# Patient Record
Sex: Male | Born: 1945 | Race: Black or African American | Hispanic: No | Marital: Single | Smoking: Former smoker
Health system: Southern US, Community
[De-identification: ages and names within clinical notes are randomized; demographics above are authoritative.]

## PROBLEM LIST (undated history)

## (undated) DIAGNOSIS — I340 Nonrheumatic mitral (valve) insufficiency: Secondary | ICD-10-CM

## (undated) DIAGNOSIS — I119 Hypertensive heart disease without heart failure: Secondary | ICD-10-CM

## (undated) DIAGNOSIS — I2089 Other forms of angina pectoris: Secondary | ICD-10-CM

## (undated) DIAGNOSIS — G629 Polyneuropathy, unspecified: Secondary | ICD-10-CM

## (undated) DIAGNOSIS — I208 Other forms of angina pectoris: Secondary | ICD-10-CM

## (undated) DIAGNOSIS — G4730 Sleep apnea, unspecified: Secondary | ICD-10-CM

## (undated) DIAGNOSIS — E119 Type 2 diabetes mellitus without complications: Secondary | ICD-10-CM

## (undated) DIAGNOSIS — I5032 Chronic diastolic (congestive) heart failure: Secondary | ICD-10-CM

## (undated) DIAGNOSIS — I420 Dilated cardiomyopathy: Secondary | ICD-10-CM

## (undated) DIAGNOSIS — I10 Essential (primary) hypertension: Secondary | ICD-10-CM

## (undated) DIAGNOSIS — I519 Heart disease, unspecified: Secondary | ICD-10-CM

## (undated) DIAGNOSIS — S069X9A Unspecified intracranial injury with loss of consciousness of unspecified duration, initial encounter: Secondary | ICD-10-CM

## (undated) DIAGNOSIS — S069XAA Unspecified intracranial injury with loss of consciousness status unknown, initial encounter: Secondary | ICD-10-CM

## (undated) HISTORY — PX: BACK SURGERY: SHX140

---

## 2005-11-14 ENCOUNTER — Emergency Department: Payer: Self-pay | Admitting: Emergency Medicine

## 2008-09-05 ENCOUNTER — Inpatient Hospital Stay (HOSPITAL_COMMUNITY): Admission: AC | Admit: 2008-09-05 | Discharge: 2008-09-07 | Payer: Self-pay

## 2010-06-18 LAB — CBC
HCT: 38.8 % — ABNORMAL LOW (ref 39.0–52.0)
Hemoglobin: 12.8 g/dL — ABNORMAL LOW (ref 13.0–17.0)
MCHC: 33.1 g/dL (ref 30.0–36.0)
MCHC: 33.3 g/dL (ref 30.0–36.0)
MCHC: 33.5 g/dL (ref 30.0–36.0)
MCV: 85.2 fL (ref 78.0–100.0)
MCV: 85.4 fL (ref 78.0–100.0)
MCV: 85.5 fL (ref 78.0–100.0)
Platelets: 189 10*3/uL (ref 150–400)
Platelets: 216 10*3/uL (ref 150–400)
RBC: 4.51 MIL/uL (ref 4.22–5.81)
RDW: 16.3 % — ABNORMAL HIGH (ref 11.5–15.5)
RDW: 16.5 % — ABNORMAL HIGH (ref 11.5–15.5)

## 2010-06-18 LAB — POCT I-STAT, CHEM 8
BUN: 10 mg/dL (ref 6–23)
Calcium, Ion: 1.02 mmol/L — ABNORMAL LOW (ref 1.12–1.32)
Creatinine, Ser: 1.5 mg/dL (ref 0.4–1.5)
TCO2: 22 mmol/L (ref 0–100)

## 2010-06-18 LAB — HEPATIC FUNCTION PANEL
AST: 77 U/L — ABNORMAL HIGH (ref 0–37)
Bilirubin, Direct: 0.2 mg/dL (ref 0.0–0.3)
Indirect Bilirubin: 1.4 mg/dL — ABNORMAL HIGH (ref 0.3–0.9)

## 2010-06-18 LAB — TYPE AND SCREEN
ABO/RH(D): O POS
Antibody Screen: NEGATIVE

## 2010-06-18 LAB — BASIC METABOLIC PANEL
CO2: 26 mEq/L (ref 19–32)
Calcium: 8.8 mg/dL (ref 8.4–10.5)
Chloride: 111 mEq/L (ref 96–112)
GFR calc Af Amer: >60 mL/min (ref >60)
Sodium: 143 mEq/L (ref 135–145)

## 2010-06-18 LAB — AMYLASE: Amylase: 102 U/L (ref 27–131)

## 2010-06-18 LAB — GLUCOSE, CAPILLARY
Glucose-Capillary: 118 mg/dL — ABNORMAL HIGH (ref 70–99)
Glucose-Capillary: 157 mg/dL — ABNORMAL HIGH (ref 70–99)

## 2010-06-18 LAB — LACTIC ACID, PLASMA: Lactic Acid, Venous: 2.6 mmol/L — ABNORMAL HIGH (ref 0.5–2.2)

## 2010-06-18 LAB — LIPASE, BLOOD: Lipase: 21 U/L (ref 11–59)

## 2010-06-18 LAB — ABO/RH: ABO/RH(D): O POS

## 2010-07-24 NOTE — H&P (Signed)
[name], Malik [name]           ACCOUNT NO.:  [id number]   MEDICAL RECORD NO.:  [id number]          PATIENT TYPE:  EMS   LOCATION:  MAJO                         FACILITY:  [facility]   PHYSICIAN:  Juanetta Gosling, MDDATE OF BIRTH:  1945-08-21   DATE OF ADMISSION:  09/05/2008  DATE OF DISCHARGE:                              HISTORY & PHYSICAL   CHIEF COMPLAINT:  Status post crush injury.   HISTORY OF PRESENT ILLNESS:  This is a 65 year old male who was driving  his Bobcat at home today, got out and then was stuck between the  shoveled portion of the [place] and another object.  He was extracted  with some help from his neighbors.  He was awake for the entire event  and remembers the entire incident.  He arrives with some right-sided  chest and flank pain.  He was deemed a gold trauma due to the possible  crush injury, but remained stable upon transfer from [place].  He had  no other complaints.   PAST MEDICAL HISTORY:  1. Diabetes.  2. Hypertension.  He has been on medication for both of those in the      past, which he stopped on his own 1 year ago and has not seen the      physician since then.   PAST SURGICAL HISTORY:  Lumbar surgery.   SOCIAL HISTORY:  Denies drug, tobacco.  He does drink occasional beer.  He lives by himself in [place], he is retired.   ALLERGIES:  He has no known drug allergies.   MEDICATIONS:  None currently.  Primary physician is in [place].  His  tetanus was given today.   REVIEW OF SYSTEMS:  Review of systems is otherwise negative.  He has no  history of MI or any cardiac disease.   PHYSICAL EXAMINATION:  VITAL SIGNS:  Afebrile.  Pulse of 96,  respirations 20, blood pressure is 200/120.  His O2 sats are 95 on room  air.  GENERAL:  He is a well-appearing elderly male, in no distress.  HEENT:  Sclerae, anicteric.  His pupils are equal, round and reactive to  light.  His extraocular movements are intact.  His TMs are clear.  They  are  proximally obscured with cerumen.  His face has no lesions or  ecchymosis and he has no obvious malocclusion.  NECK:  Nontender.  His range of motion is grossly intact without pain.  LUNGS:  Clear bilaterally.  He does have some tenderness at the lower  portion of his right ribcage and around to his flank upon palpation.  HEART:  Regular rate and rhythm.  ABDOMEN:  Soft.  He is very mildly tender in his right upper quadrant,  which is mostly secondary to his rib pain, but certainly has no  peritoneal sign.  PELVIS:  No lesion.  He has no meatal blood present and is nontender.  MUSCULOSKELETAL:  He moves all extremities and is nontender throughout  in all extremities with palpable pulses.  BACK:  Low well-healed lumbar scar.  No lesions, or step-offs, or  tenderness are nontender.  NEUROLOGIC:  He has GCS  of 15, oriented x3, had no focal deficits noted  on his examination.   LABORATORY EVALUATION:  Hematocrit of 44.  Sodium 142, potassium 4.1,  chloride 102, CO2 of 22, BUN 10, creatinine 1.5, glucose 193.  EKG; he  has normal sinus rhythm.  His chest x-ray shows right seventh and eight  rib fracture with no pneumothorax and a possible right rib contusion.  Left iliac crest is partially excluded, but otherwise this is without  any evidence of fracture.  CT of his chest shows nondisplaced posterior  right seventh and eighth rib fractures with no evidence of any fusion or  any pneumothorax on CT scan.  He has some atelectasis present.  He does  have also heterogeneous enlargement of his right thyroid with possible  focal lesions, thus ultrasound followup is recommended.  CT of his  abdomen and pelvis shows a small amount of fluid possibly blood along  the liver.  It is noted, there is no real discrete hepatic injury that  is noted due to some artifact.  There is also some high density along  the inferior aspect of the gallbladder, which is difficult to determine  the source of this.  His  pelvis shows no evidence of any free fluid.   ASSESSMENT:  1. Status post crush injury with right rib fractures and have      questionable liver laceration.  2. Untreated hypertension and diabetes.   PLAN:  We will admit and observe him overnight.  Plan on pain control  and pulmonary toilet for his rib fractures.  We will recheck his  hematocrit in the morning, but again, I do not think that he has any  real liver laceration noted on the CT scan, this may just be from the  initial crush, but he will remain n.p.o. overnight and we will continue  to examine him.  He will also need some long-term followup for his  hypertension, diabetes, indeed I will discuss that tonight.      Juanetta Gosling, MD  Electronically Signed     MCW/MEDQ  D:  09/05/2008  T:  09/06/2008  Job:  [id number]

## 2010-07-24 NOTE — Discharge Summary (Signed)
NAMEDAIMIEN, PATMON           ACCOUNT NO.:  [id number]   MEDICAL RECORD NO.:  [id number]          PATIENT TYPE:  INP   LOCATION:  5123                         FACILITY:  [facility]   PHYSICIAN:  Wilmon Arms. Corliss Skains, M.D. DATE OF BIRTH:  February 20, 1946   DATE OF ADMISSION:  09/05/2008  DATE OF DISCHARGE:  09/07/2008                               DISCHARGE SUMMARY   ADMITTING TRAUMA SURGEON:  Troy Sine. Dwain Sarna, MD   CONSULTANTS:  None.   DISCHARGE DIAGNOSES:  1. Status post blunt thoracic and abdominal trauma when the patient      was pinned between a Bobcat and a wall.  2. Right rib fractures x2.  3. Probable small liver laceration with hemoperitoneum.  4. Hypertension, untreated.  5. Diabetes mellitus, untreated.   HISTORY ON ADMISSION:  This is an otherwise relatively healthy African  American male who was apparently doing something with his Bobcat, it got  stuck between the [facility] and some type of a wall or side of a building.  He got his chest and abdominal region pinned in.  He was able to get  extricated with help and has full memory of the incident but is  reporting right-sided chest and flank pain.  He presents with no other  complaints.  He does have a past medical history significant for  hypertension and diabetes but has not been on his medication likely for  greater than a year.  His presenting blood pressure was 200/120.  Plain  chest x-ray showed right-sided rib fractures 7 through 8, no  pneumothorax, a small apical pulmonary contusion.  Plain pelvis film was  negative for fractures.   The patient underwent CT scan of the chest again which showed his right  rib fractures x2.  There was some free fluid around the liver but no  clear liver laceration, although it is felt that he likely did have  small liver laceration.   He was admitted for observation and pain control and immobilization.  His initial hemoglobin was 12.8, hematocrit 38.5, white blood cell count  is  10,300, and platelets of 193,000.  He was starting to mobilize some  but was having some increased discomfort and then began to run a low-  grade fever on September 07, 2008.  He was mobilized again, however, and the  fever resolved.  Followup labs showed a stable hemoglobin at 12.8,  hematocrit of 38.8, white blood cell count of 11,100, and platelets of  189,000.  He did have some minimally elevated liver function studies and  normal amylase and lipase.  Albumin was 2.8.  His bilirubin was  minimally elevated at 1.6, but this may likely be secondary to his liver  injury.  The patient reports feeling dramatically better and is ready to  go home.   At this time, he is felt to be medically stable.   MEDICATIONS AT TIME OF DISCHARGE:  Lotensin 5 mg once daily,  hydrochlorothiazide 25 mg one-half tablet once daily.  These blood  pressure medicines were started during this hospitalization, and the  patient will need to follow up with his regular physician, I believe  that is Dr. Lorin Picket may be in [facility] for further followup of blood  pressure, medications, blood sugars, and possible medication for blood  sugar management.   A prescription was also written for Percocet 5/325 mg 1-2 p.o. q.4 hours  p.r.n. pain, #60, no refill.  He will follow up with [facility]uma Service on  September 15, 2008, at 2:00 p.m. or sooner should he have any difficulties in  the interim.  Diet is low sodium, heart healthy, no sugar, no sweets,  low carbohydrate.      Shawn Rayburn, P.A.      Wilmon Arms. Tsuei, M.D.  Electronically Signed    SR/MEDQ  D:  09/07/2008  T:  09/08/2008  Job:  [id number]   cc:   [facility].  [employer] in [place].

## 2010-10-12 ENCOUNTER — Emergency Department: Payer: Self-pay | Admitting: Unknown Physician Specialty

## 2011-06-11 LAB — URINALYSIS, COMPLETE
Bacteria: NONE SEEN
Bilirubin,UR: NEGATIVE
Hyaline Cast: 1
Ketone: NEGATIVE
Ph: 5 (ref 4.5–8.0)
Specific Gravity: 1.025 (ref 1.003–1.030)
WBC UR: <1 /HPF (ref 0–5)

## 2011-06-11 LAB — CBC WITH DIFFERENTIAL/PLATELET
Basophil %: 0.4 %
HGB: 13.2 g/dL (ref 13.0–18.0)
Lymphocyte #: 1.6 10*3/uL (ref 1.0–3.6)
Lymphocyte %: 15.8 %
MCHC: 33.6 g/dL (ref 32.0–36.0)
MCV: 89 fL (ref 80–100)
Monocyte #: 1 10*3/uL — ABNORMAL HIGH (ref 0.0–0.7)
Platelet: 193 10*3/uL (ref 150–440)
WBC: 10.4 10*3/uL (ref 3.8–10.6)

## 2011-06-11 LAB — BASIC METABOLIC PANEL
Anion Gap: 10 (ref 7–16)
BUN: 16 mg/dL (ref 7–18)
Chloride: 108 mmol/L — ABNORMAL HIGH (ref 98–107)
Co2: 24 mmol/L (ref 21–32)
Creatinine: 1.3 mg/dL (ref 0.60–1.30)
EGFR (Non-African Amer.): 59 — ABNORMAL LOW

## 2011-06-12 ENCOUNTER — Observation Stay: Payer: Self-pay | Admitting: Urology

## 2012-06-09 ENCOUNTER — Emergency Department: Payer: Self-pay | Admitting: Emergency Medicine

## 2012-06-10 ENCOUNTER — Emergency Department: Payer: Self-pay | Admitting: Emergency Medicine

## 2012-06-12 ENCOUNTER — Emergency Department: Payer: Self-pay | Admitting: Emergency Medicine

## 2012-06-15 ENCOUNTER — Ambulatory Visit: Payer: Self-pay | Admitting: Internal Medicine

## 2012-08-18 ENCOUNTER — Ambulatory Visit: Payer: Self-pay | Admitting: Internal Medicine

## 2012-09-03 ENCOUNTER — Ambulatory Visit: Payer: Self-pay | Admitting: Internal Medicine

## 2013-08-11 ENCOUNTER — Emergency Department: Payer: Self-pay | Admitting: Emergency Medicine

## 2014-04-16 ENCOUNTER — Emergency Department: Payer: Self-pay | Admitting: Emergency Medicine

## 2014-07-03 NOTE — Discharge Summary (Signed)
PATIENT NAME:  Malik [name], Malik [name] MR#:  [id number] DATE OF BIRTH:  [birth year]  DATE OF ADMISSION:  06/12/2011 DATE OF DISCHARGE:  06/13/2011  PRINCIPLE DIAGNOSIS: Scrotal abscess.   PROCEDURE: Incision and drainage of scrotal abscess.   HOSPITAL COURSE: [name] was admitted through the Emergency Room on 06/11/2011 with a large left hemi scrotal abscess. He underwent incision and drainage in the operating room. He had no significant postoperative problems or complications. He remained afebrile, vital signs stable. He was noted to have moderate blood pressure elevation. He is on blood pressure medication but was not taking the medication prior to presentation. He was restarted on his blood pressure medication with slight improvement He was instructed as to proper wound care. He was comfortable with the procedure. There was decreased induration of the area surrounding the site with no evidence of extension or Fournier's gangrene. He was continued on IV antibiotic therapy. The cultures were pending at the time of discharge. He was subsequently discharged to home on the morning of 06/13/2011 on Bactrim for a seven-day course. He is to perform packing of the wound with Nu Gauze covered in clean, dry, gauze three times daily. He is to follow-up in one week for re-evaluation. He is to continue with his clonidine. It was strongly stressed that he should take the medication due to potential implications with poor blood pressure control. He was also discharged with Percocet 1 to 2 every 4 to 6 hours as needed for wound care. He is to notify us if there are any further problems or questions in the interim.   ____________________________ Denice Bors Jacqlyn Larsen, MD bsc:drc D: 06/13/2011 08:05:26 ET T: 06/13/2011 10:36:30 ET JOB#: [id number]  cc: Denice Bors. Jacqlyn Larsen, MD, <Dictator> Denice Bors Dariann Huckaba MD ELECTRONICALLY SIGNED 06/13/2011 13:11

## 2014-07-03 NOTE — Op Note (Signed)
PATIENT NAME:  Malik [name], Malik [name] MR#:  [id number] DATE OF BIRTH:  30-May-1945  DATE OF PROCEDURE:  06/11/2011  PREOPERATIVE DIAGNOSIS: Scrotal abscess.   POSTOPERATIVE DIAGNOSIS: Scrotal abscess.  PROCEDURE PERFORMED: Incision and drainage of scrotal abscess.   SURGEON: Edrick Oh, M.D.   ANESTHESIA: General endotracheal anesthesia.   INDICATIONS: The patient is a 69 year old African American gentleman with a history of recurrent boils. He presented to the emergency room with a several day history of an enlarging scrotal abscess with slight drainage. Due to the size and extent of the abscess, we have elected to proceed with incision and drainage under anesthesia.   DESCRIPTION OF PROCEDURE: After informed consent was obtained, the patient was taken to the operating room and placed in the supine position, on the operating room table, under general endotracheal anesthesia. The patient was then prepped and draped in the usual standard fashion. An approximate 2.5 cm area of fluctuance was noted on the left anterior/inferior aspect of the left hemiscrotum. Induration extended for an approximate additional 6 cm surrounding the area. A small area of eruption was noted in the central aspect of the fluctuance. The area was incised utilizing a knife. A large amount of purulent material was drained. Cultures were obtained. The cavity was probed utilizing digital manipulation. The cavity extended approximately 5 to 6 cm superior from the site of the opening. Little extension was inferior or lateral. Debris was removed from the cavity. It was then irrigated with ample amounts of saline and Betadine solution. The wound was then packed with Betadine soaked [place]. Gauze and        fluffs were then placed, as well as a scrotal support. The patient was then awakened from general endotracheal anesthesia and was taken to the recovery room in stable condition. There were no problems or complications. The patient  tolerated the procedure well.  ____________________________ Denice Bors. Jacqlyn Larsen, MD bsc:slb D: 06/13/2011 08:02:49 ET T: 06/13/2011 10:12:19 ET JOB#: [id number]  cc: Denice Bors. Jacqlyn Larsen, MD, <Dictator> Denice Bors Gabrianna Fassnacht MD ELECTRONICALLY SIGNED 06/13/2011 13:11

## 2015-05-19 ENCOUNTER — Encounter: Payer: Self-pay | Admitting: Emergency Medicine

## 2015-05-19 ENCOUNTER — Emergency Department
Admission: EM | Admit: 2015-05-19 | Discharge: 2015-05-19 | Disposition: A | Discharge status: Home / Self Care | Payer: Medicare Other | Attending: Emergency Medicine | Admitting: Emergency Medicine

## 2015-05-19 DIAGNOSIS — K0889 Other specified disorders of teeth and supporting structures: Secondary | ICD-10-CM | POA: Insufficient documentation

## 2015-05-19 DIAGNOSIS — K029 Dental caries, unspecified: Secondary | ICD-10-CM | POA: Diagnosis not present

## 2015-05-19 DIAGNOSIS — I10 Essential (primary) hypertension: Secondary | ICD-10-CM | POA: Diagnosis not present

## 2015-05-19 DIAGNOSIS — E119 Type 2 diabetes mellitus without complications: Secondary | ICD-10-CM | POA: Insufficient documentation

## 2015-05-19 DIAGNOSIS — Z87891 Personal history of nicotine dependence: Secondary | ICD-10-CM | POA: Diagnosis not present

## 2015-05-19 HISTORY — DX: Type 2 diabetes mellitus without complications: E11.9

## 2015-05-19 HISTORY — DX: Essential (primary) hypertension: I10

## 2015-05-19 MED ORDER — PENICILLIN V POTASSIUM 500 MG PO TABS
500.0000 mg | ORAL_TABLET | Freq: Once | ORAL | Status: AC
  Administered 2015-05-19: 500 mg via ORAL
  Filled 2015-05-19: qty 1

## 2015-05-19 MED ORDER — TRAMADOL HCL 50 MG PO TABS
50.0000 mg | ORAL_TABLET | Freq: Once | ORAL | Status: AC
  Administered 2015-05-19: 50 mg via ORAL
  Filled 2015-05-19: qty 1

## 2015-05-19 MED ORDER — PENICILLIN V POTASSIUM 500 MG PO TABS: 500.0000 mg | ORAL_TABLET | Freq: Four times a day (QID) | ORAL | Status: DC

## 2015-05-19 NOTE — ED Provider Notes (Signed)
[facility] Emergency Department Provider Note ____________________________________________  Time seen: 1439  I have reviewed the triage vital signs and the nursing notes.  HISTORY  Chief Complaint  Dental Pain  HPI Malik [name] is a 70 y.o. male presents to the ED for evaluation of dental pain to the right lower jaw and a present cavity. Patientwas onset of pain about 2 weeks ago and has been worsening for the last couple days. He denies any fevers, chills, sweats. He also denies any outright or frank drainage from any pointing, fluctuant, or draining abscess in the mouth. He reports his pain at a 10/10 in triage.  Past Medical History  Diagnosis Date  . Hypertension   . Diabetes mellitus without complication ([place])    There are no active problems to display for this patient.  Past Surgical History  Procedure Laterality Date  . Back surgery      Current Outpatient Rx  Name  Route  Sig  Dispense  Refill  . penicillin v potassium (VEETID) 500 MG tablet   Oral   Take 1 tablet (500 mg total) by mouth 4 (four) times daily.   40 tablet   0    Allergies Review of patient's allergies indicates no known allergies.  No family history on file.  Social History Social History  Substance Use Topics  . Smoking status: Former Research scientist (life sciences)  . Smokeless tobacco: Never Used  . Alcohol Use: Yes   Review of Systems  Constitutional: Negative for fever. Eyes: Negative for visual changes. ENT: Negative for sore throat. Dental pain as above. Cardiovascular: Negative for chest pain. Respiratory: Negative for shortness of breath. Gastrointestinal: Negative for abdominal pain, vomiting and diarrhea. Skin: Negative for rash. Neurological: Negative for headaches, focal weakness or numbness. ____________________________________________  PHYSICAL EXAM:  VITAL SIGNS: ED Triage Vitals  Enc Vitals Group     BP 05/19/15 1215 193/72 mmHg     Pulse Rate 05/19/15 1215  73     Resp 05/19/15 1215 16     Temp 05/19/15 1215 98 F (36.7 C)     Temp Source 05/19/15 1215 Oral     SpO2 05/19/15 1215 99 %     Weight 05/19/15 1215 220 lb (99.791 kg)     Height 05/19/15 1215 5\' 11"  (1.803 m)     Head Cir --      Peak Flow --      Pain Score 05/19/15 1217 10     Pain Loc --      Pain Edu? --      Excl. in [place]? --    Constitutional: Alert and oriented. Well appearing and in no distress. Head: Normocephalic and atraumatic.      Eyes: Conjunctivae are normal. PERRL. Normal extraocular movements      Ears: Canals clear. TMs intact bilaterally.   Nose: No congestion/rhinorrhea.   Mouth/Throat: Mucous membranes are moist. Uvula is midline and tonsils are flat. Patient without any gum swelling generally to the lower jaw. No pointing, fluctuant, or draining abscesses appreciated. There is no obvious dental crack or fracture noted to the lower molars.   Neck: Supple. No thyromegaly. Hematological/Lymphatic/Immunological: No cervical lymphadenopathy. Cardiovascular: Normal rate, regular rhythm.  Respiratory: Normal respiratory effort. No wheezes/rales/rhonchi. Gastrointestinal: Soft and nontender. No distention. Musculoskeletal: Nontender with normal range of motion in all extremities.  Neurologic:  Normal gait without ataxia. Normal speech and language. No gross focal neurologic deficits are appreciated. Skin:  Skin is warm, dry and intact. No rash  noted. Psychiatric: Mood and affect are normal. Patient exhibits appropriate insight and judgment. ____________________________________________  PROCEDURES  Pen VK 500 mg PO Ultram 50 mg PO ____________________________________________  INITIAL IMPRESSION / ASSESSMENT AND PLAN / ED COURSE  Patient with acute dental pain to the right lower jaw secondary to underlying cavity. Patient is discharged with a prescription for Pen-Vee K to dose as directed. He is also given a list of dental providers for definitive  treatment and follow-up. ____________________________________________  FINAL CLINICAL IMPRESSION(S) / ED DIAGNOSES  Final diagnoses:  Pain due to dental caries  Dental caries      Melvenia Needles, PA-C 05/19/15 [place], MD 05/20/15 1506

## 2015-05-19 NOTE — Discharge Instructions (Signed)
Dental Caries Dental caries is tooth decay. This decay can cause a hole in teeth (cavity) that can get bigger and deeper over time. HOME CARE  Brush and floss your teeth. Do this at least two times a day.  Use a fluoride toothpaste.  Use a mouth rinse if told by your dentist or doctor.  Eat less sugary and starchy foods. Drink less sugary drinks.  Avoid snacking often on sugary and starchy foods. Avoid sipping often on sugary drinks.  Keep regular checkups and cleanings with your dentist.  Use fluoride supplements if told by your dentist or doctor.  Allow fluoride to be applied to teeth if told by your dentist or doctor.   This information is not intended to replace advice given to you by your health care provider. Make sure you discuss any questions you have with your health care provider.   Document Released: 12/05/2007 Document Revised: 03/18/2014 Document Reviewed: 02/28/2012 Elsevier Interactive Patient Education 2016 [place] with one of the clinics listed below. Take the antibiotic as directed.   OPTIONS FOR DENTAL FOLLOW UP CARE  [place] [email or web address] [phone])  [facility] [phone])  [place] [phone])  [place] [phone])  [facility] [phone]) This clinic caters to the indigent population and is on a lottery system. Location: [employer] [facility], [place], [place], [place] Clinic Hours: Wednesdays from 6pm - 9pm, patients seen by a lottery system. For dates, call or go to [email or web address] Services: Cleanings, fillings and simple extractions. Payment Options: DENTAL WORK IS FREE OF CHARGE. Bring proof of income or support. Best way to get seen: Arrive at 5:15 pm  - this is a lottery, NOT first come/first serve, so arriving earlier will not increase your chances of being seen.     [facility] [phone] Select option 1 for emergencies   Location: [facility], [place], [address], [place] Clinic Hours: No walk-ins accepted - call the day before to schedule an appointment. Check in times are 9:30 am and 1:30 pm. Services: Simple extractions, temporary fillings, pulpectomy/pulp debridement, uncomplicated abscess drainage. Payment Options: PAYMENT IS DUE AT THE TIME OF SERVICE.  Fee is usually $100-200, additional surgical procedures (e.g. abscess drainage) may be extra. Cash, checks, Visa/MasterCard accepted.  Can file Medicaid if patient is covered for dental - patient should call case worker to check. No discount for [facility] patients. Best way to get seen: MUST call the day before and get onto the schedule. Can usually be seen the next 1-2 days. No walk-ins accepted.     [place] [phone]   Location: [place], [place] Clinic Hours: M, W, Th, F 8am or 1:30pm, Tues 9a or 1:30 - first come/first served. Services: Simple extractions, temporary fillings, uncomplicated abscess drainage.  You do not need to be an [facility] resident. Payment Options: PAYMENT IS DUE AT THE TIME OF SERVICE. Dental insurance, otherwise sliding scale - bring proof of income or support. Depending on income and treatment needed, cost is usually $50-200. Best way to get seen: Arrive early as it is first come/first served.     [facility] [phone]   Location: [place] [facility] Hours: Mon-Thu 8a-5p Services: Most basic dental services including extractions and  fillings. Payment Options: PAYMENT IS DUE AT THE TIME OF SERVICE. Sliding scale, up to 50% off - bring proof if income or  support. Medicaid with dental option accepted. Best way to get seen: Call to schedule an appointment, can usually be seen within 2 weeks OR they will try to see walk-ins - show up at [place] or 2p (you may have to wait).     [facility] [place] RESIDENTS ONLY   Location: [facility], [place] [address], [place], [place] [address] Clinic Hours: By appointment only. Monday - Thursday 8am-5pm, Friday 8am-12pm Services: Cleanings, fillings, extractions. Payment Options: PAYMENT IS DUE AT THE TIME OF SERVICE. Cash, Visa or MasterCard. Sliding scale - $30 minimum per service. Best way to get seen: Come in to office, complete packet and make an appointment - need proof of income or support monies for each household member and proof of [facility] residence. Usually takes about a month to get in.     [facility] [phone]   Location: [address] [place] [facility] Hours: Walk-in Urgent Care Dental Services are offered Monday-Friday mornings only. The numbers of emergencies accepted daily is limited to the number of providers available. Maximum 15 - Mondays, Wednesdays & Thursdays Maximum 10 - Tuesdays & Fridays Services: You do not need to be a [facility] resident to be seen for a dental emergency. Emergencies are defined as pain, swelling, abnormal bleeding, or dental trauma. Walkins will receive x-rays if needed. NOTE: Dental cleaning is not an emergency. Payment Options: PAYMENT IS DUE AT THE TIME OF SERVICE. Minimum co-pay is $40.00 for uninsured patients. Minimum co-pay is $3.00 for Medicaid with dental coverage. Dental Insurance is accepted and must be presented at time of visit. Medicare does not cover dental. Forms of payment: Cash, credit card, checks. Best way to get seen: If not previously registered with the clinic, walk-in dental registration begins at 7:15 am and is on a first  come/first serve basis. If previously registered with the clinic, call to make an appointment.     [facility] [place] ONLY   Location: [address], [place], [place] Clinic Hours: Mon-Thu 10a-2p Services: Extractions only! Payment Options: FREE (donations accepted) - bring proof of income or support Best way to get seen: Call and schedule an appointment OR come at 8am on the 1st Monday of every month (except for holidays) when it is first come/first served.     [facility] [phone]   Location: [place], [place] Clinic Hours: Friday mornings Services, Payment Options, Best way to get seen: Call for info

## 2015-05-19 NOTE — ED Notes (Signed)
C/O lower right jaw dental pain.  Onset of symptoms 2 weeks ago, but has been worsening over the past couple days.

## 2015-06-30 ENCOUNTER — Encounter: Payer: Self-pay | Admitting: Radiology

## 2015-06-30 ENCOUNTER — Observation Stay
Admission: EM | Admit: 2015-06-30 | Discharge: 2015-07-01 | Disposition: A | Discharge status: Home / Self Care | Payer: Medicare Other | Attending: Internal Medicine | Admitting: Internal Medicine

## 2015-06-30 ENCOUNTER — Emergency Department: Payer: Medicare Other

## 2015-06-30 DIAGNOSIS — D631 Anemia in chronic kidney disease: Secondary | ICD-10-CM | POA: Diagnosis present

## 2015-06-30 DIAGNOSIS — R1013 Epigastric pain: Secondary | ICD-10-CM | POA: Insufficient documentation

## 2015-06-30 DIAGNOSIS — I10 Essential (primary) hypertension: Secondary | ICD-10-CM | POA: Diagnosis present

## 2015-06-30 DIAGNOSIS — E1122 Type 2 diabetes mellitus with diabetic chronic kidney disease: Principal | ICD-10-CM | POA: Insufficient documentation

## 2015-06-30 DIAGNOSIS — R0600 Dyspnea, unspecified: Secondary | ICD-10-CM | POA: Diagnosis not present

## 2015-06-30 DIAGNOSIS — Z7984 Long term (current) use of oral hypoglycemic drugs: Secondary | ICD-10-CM | POA: Insufficient documentation

## 2015-06-30 DIAGNOSIS — N183 Chronic kidney disease, stage 3 (moderate): Secondary | ICD-10-CM | POA: Insufficient documentation

## 2015-06-30 DIAGNOSIS — N289 Disorder of kidney and ureter, unspecified: Secondary | ICD-10-CM

## 2015-06-30 DIAGNOSIS — I700 Atherosclerosis of aorta: Secondary | ICD-10-CM | POA: Diagnosis not present

## 2015-06-30 DIAGNOSIS — Z8249 Family history of ischemic heart disease and other diseases of the circulatory system: Secondary | ICD-10-CM | POA: Diagnosis not present

## 2015-06-30 DIAGNOSIS — R5383 Other fatigue: Secondary | ICD-10-CM | POA: Insufficient documentation

## 2015-06-30 DIAGNOSIS — I129 Hypertensive chronic kidney disease with stage 1 through stage 4 chronic kidney disease, or unspecified chronic kidney disease: Secondary | ICD-10-CM | POA: Diagnosis not present

## 2015-06-30 DIAGNOSIS — Z87891 Personal history of nicotine dependence: Secondary | ICD-10-CM | POA: Diagnosis not present

## 2015-06-30 DIAGNOSIS — N189 Chronic kidney disease, unspecified: Secondary | ICD-10-CM

## 2015-06-30 DIAGNOSIS — N200 Calculus of kidney: Secondary | ICD-10-CM | POA: Insufficient documentation

## 2015-06-30 DIAGNOSIS — D649 Anemia, unspecified: Secondary | ICD-10-CM

## 2015-06-30 DIAGNOSIS — K2970 Gastritis, unspecified, without bleeding: Secondary | ICD-10-CM | POA: Diagnosis not present

## 2015-06-30 DIAGNOSIS — N281 Cyst of kidney, acquired: Secondary | ICD-10-CM | POA: Insufficient documentation

## 2015-06-30 LAB — CBC
HCT: 24 % — ABNORMAL LOW (ref 40.0–52.0)
Hemoglobin: 6.8 g/dL — ABNORMAL LOW (ref 13.0–18.0)
MCH: 17.3 pg — AB (ref 26.0–34.0)
MCHC: 28.3 g/dL — AB (ref 32.0–36.0)
MCV: 61.2 fL — AB (ref 80.0–100.0)
PLATELETS: 317 10*3/uL (ref 150–440)
RBC: 3.92 MIL/uL — AB (ref 4.40–5.90)
RDW: 21.9 % — AB (ref 11.5–14.5)
WBC: 8.4 10*3/uL (ref 3.8–10.6)

## 2015-06-30 LAB — COMPREHENSIVE METABOLIC PANEL
ALK PHOS: 64 U/L (ref 38–126)
ALT: 14 U/L — AB (ref 17–63)
AST: 25 U/L (ref 15–41)
Albumin: 3.9 g/dL (ref 3.5–5.0)
Anion gap: 5 (ref 5–15)
BUN: 21 mg/dL — AB (ref 6–20)
CALCIUM: 8.9 mg/dL (ref 8.9–10.3)
CHLORIDE: 107 mmol/L (ref 101–111)
CO2: 28 mmol/L (ref 22–32)
CREATININE: 1.56 mg/dL — AB (ref 0.61–1.24)
GFR calc Af Amer: 50 mL/min — ABNORMAL LOW (ref >60)
GFR calc non Af Amer: 43 mL/min — ABNORMAL LOW (ref >60)
GLUCOSE: 133 mg/dL — AB (ref 65–99)
Potassium: 4 mmol/L (ref 3.5–5.1)
SODIUM: 140 mmol/L (ref 135–145)
Total Bilirubin: 0.9 mg/dL (ref 0.3–1.2)
Total Protein: 8.2 g/dL — ABNORMAL HIGH (ref 6.5–8.1)

## 2015-06-30 LAB — TROPONIN I: TROPONIN I: 0.03 ng/mL (ref <0.031)

## 2015-06-30 LAB — LIPASE, BLOOD: LIPASE: 46 U/L (ref 11–51)

## 2015-06-30 MED ORDER — ONDANSETRON HCL 4 MG/2ML IJ SOLN
4.0000 mg | Freq: Once | INTRAMUSCULAR | Status: AC
  Administered 2015-07-01: 4 mg via INTRAVENOUS
  Filled 2015-06-30: qty 2

## 2015-06-30 MED ORDER — IOPAMIDOL (ISOVUE-370) INJECTION 76%
75.0000 mL | Freq: Once | INTRAVENOUS | Status: AC | PRN
  Administered 2015-06-30: 75 mL via INTRAVENOUS

## 2015-06-30 MED ORDER — MORPHINE SULFATE (PF) 2 MG/ML IV SOLN
2.0000 mg | Freq: Once | INTRAVENOUS | Status: AC
  Administered 2015-07-01: 2 mg via INTRAVENOUS
  Filled 2015-06-30: qty 1

## 2015-06-30 NOTE — ED Provider Notes (Signed)
[facility] Provider Note  ____________________________________________  Time seen: Approximately 11:49 PM  I have reviewed the triage vital signs and the nursing notes.   HISTORY  Chief Complaint Abdominal Pain    HPI Malik [name] is a 70 y.o. male who comes into the hospital today with some abdominal pain. The patient reports the abdominal pain started a few weeks ago. He reports that he tried to deal with it but it just won't go away. He reports that his girlfriend brought him some numbing things to flush it out and he also tried to drink some soda water with vinegar but it did not help. The patient reports he vomited 3 times last week but has not done so since then. The patient reports that he has not had any diarrhea. He has been having normal bowel movements and his last one was right before he came into the hospital. The patient reports his pain is in his upper abdomen and tends to move to both sides. He rates his pain a 4 out of 10 in intensity currently. He denies any fevers and has not seen his doctor. The patient reports that he has been able to eat but it does make his pain worse. The patient is here to try to figure out what's going on with his abdomen.Patient has no chest pain, no shortness of breath, no back pain, no blurry vision.   Past Medical History  Diagnosis Date  . Hypertension   . Diabetes mellitus without complication [facility])     Patient Active Problem List   Diagnosis Date Noted  . 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    Past Surgical History  Procedure Laterality Date  . Back surgery      No current outpatient prescriptions on file.  Allergies Review of patient's allergies indicates no known allergies.  No family history on file.  Social History Social History  Substance Use Topics  . Smoking status: Former Research scientist (life sciences)  . Smokeless tobacco: Never Used  . Alcohol Use: Yes    Review of  Systems Constitutional: No fever/chills Eyes: No visual changes. ENT: No sore throat. Cardiovascular: Denies chest pain. Respiratory: Denies shortness of breath. Gastrointestinal: abdominal pain and vomiting.  No diarrhea.  No constipation. Genitourinary: Negative for dysuria. Musculoskeletal: Negative for back pain. Skin: Negative for rash. Neurological: Negative for headaches, focal weakness or numbness.  10-point ROS otherwise negative.  ____________________________________________   PHYSICAL EXAM:  VITAL SIGNS: ED Triage Vitals  Enc Vitals Group     BP 06/30/15 2158 195/66 mmHg     Pulse Rate 06/30/15 2158 70     Resp 06/30/15 2158 16     Temp 06/30/15 2158 98.5 F (36.9 C)     Temp Source 06/30/15 2158 Oral     SpO2 06/30/15 2158 100 %     Weight 06/30/15 2158 200 lb (90.719 kg)     Height 06/30/15 2158 5\' 11"  (1.803 m)     Head Cir --      Peak Flow --      Pain Score 06/30/15 2159 8     Pain Loc --      Pain Edu? --      Excl. in [place]? --     Constitutional: Alert and oriented. Well appearing and in mild distress. Eyes: Conjunctivae are normal. PERRL. EOMI. Head: Atraumatic. Nose: No congestion/rhinnorhea. Mouth/Throat: Mucous membranes are moist.  Oropharynx non-erythematous. Cardiovascular: Normal rate, regular rhythm. Grossly normal heart sounds.  Good peripheral circulation. Respiratory: Normal respiratory effort.  No retractions. Lungs CTAB. Gastrointestinal: Soft with some upper abd and RUQ tenderness to palpation. No distention. Positive bowel sounds Musculoskeletal: No lower extremity tenderness nor edema.   Neurologic:  Normal speech and language. Skin:  Skin is warm, dry and intact  Psychiatric: Mood and affect are normal.   ____________________________________________   LABS (all labs ordered are listed, but only abnormal results are displayed)  Labs Reviewed  COMPREHENSIVE METABOLIC PANEL - Abnormal; Notable for the following:    Glucose,  Bld 133 (*)    BUN 21 (*)    Creatinine, Ser 1.56 (*)    Total Protein 8.2 (*)    ALT 14 (*)    GFR calc non Af Amer 43 (*)    GFR calc Af Amer 50 (*)    All other components within normal limits  CBC - Abnormal; Notable for the following:    RBC 3.92 (*)    Hemoglobin 6.8 (*)    HCT 24.0 (*)    MCV 61.2 (*)    MCH 17.3 (*)    MCHC 28.3 (*)    RDW 21.9 (*)    All other components within normal limits  URINALYSIS COMPLETEWITH MICROSCOPIC ([facility] ONLY) - Abnormal; Notable for the following:    Color, Urine YELLOW (*)    APPearance CLEAR (*)    All other components within normal limits  LIPASE, BLOOD  TROPONIN I  TROPONIN I  TYPE AND SCREEN  ABO/RH   ____________________________________________  EKG  ED ECG REPORT I, Loney Hering, the attending physician, personally viewed and interpreted this ECG.   Date: 06/30/2015  EKG Time: 2209  Rate: 72  Rhythm: normal sinus rhythm  Axis: normal  Intervals:none  ST&T Change: none  ____________________________________________  RADIOLOGY  CT angio abdomen and pelvis: Moderate atherosclerosis of the abdominal aorta without aneurysm or acute aortic abnormality, no acute abnormality in the abdomen and pelvis, punctate nonobstructing left nephrolithiasis. Addendum shows a decompressed bladder and possible wall thickening with a concern for urinary tract infection. ____________________________________________   PROCEDURES  Procedure(s) performed: None  Critical Care performed: No  ____________________________________________   INITIAL IMPRESSION / ASSESSMENT AND PLAN / ED COURSE  Pertinent labs & imaging results that were available during my care of the patient were reviewed by me and considered in my medical decision making (see chart for details).  This is a 21-year-old male who comes into the hospital today with some upper abdominal pain. The patient's been having this pain on and off for a few weeks. Although he's  been having some epigastric and right upper quadrant tenderness to palpation his lipase is normal and his hepatic function panel is unremarkable. The patient does does have a hemoglobin of 6.8 and hematocrit 24.0. I will do a CT scan of the patient's abdomen and reassess the patient once I received the results.  The patient does not appear to have any acute intra-abdominal abnormality. I did give the patient some morphine and a GI cocktail as well and his pain is gone. The patient though does have some chronic kidney disease and some anemia. I performed a rectal exam but this did not show any blood in his stool. I'm concerned about the patient's anemia as it is significant. He reports that when he walked he does get lightheaded and short of breath. I will admit the patient to the hospitalist service for further evaluation. ____________________________________________   FINAL CLINICAL IMPRESSION(S) / ED DIAGNOSES  Final diagnoses:  Epigastric pain  Anemia, unspecified anemia type  Essential hypertension  Kidney disease      Loney Hering, MD 07/01/15 [phone]

## 2015-06-30 NOTE — ED Notes (Signed)
[place], MD at bedside. 

## 2015-06-30 NOTE — ED Notes (Signed)
Pt states 1-2 weeks of generalized abd pain that radiates to bilateral flanks. Pt states last bowel movement immediately pta. Pt states had vomiting last week , but none since. Pt appears in no acute distress in triage. Pt denies known fever.

## 2015-07-01 ENCOUNTER — Encounter: Payer: Self-pay | Admitting: *Deleted

## 2015-07-01 DIAGNOSIS — D631 Anemia in chronic kidney disease: Secondary | ICD-10-CM | POA: Diagnosis present

## 2015-07-01 DIAGNOSIS — N189 Chronic kidney disease, unspecified: Secondary | ICD-10-CM

## 2015-07-01 LAB — GLUCOSE, CAPILLARY
Glucose-Capillary: 106 mg/dL — ABNORMAL HIGH (ref 65–99)
Glucose-Capillary: 187 mg/dL — ABNORMAL HIGH (ref 65–99)

## 2015-07-01 LAB — URINALYSIS COMPLETE WITH MICROSCOPIC (ARMC ONLY)
Bacteria, UA: NONE SEEN
Bilirubin Urine: NEGATIVE
Glucose, UA: NEGATIVE mg/dL
Hgb urine dipstick: NEGATIVE
KETONES UR: NEGATIVE mg/dL
Leukocytes, UA: NEGATIVE
Nitrite: NEGATIVE
PROTEIN: NEGATIVE mg/dL
SQUAMOUS EPITHELIAL / LPF: NONE SEEN
Specific Gravity, Urine: 1.018 (ref 1.005–1.030)
WBC, UA: NONE SEEN WBC/hpf (ref 0–5)
pH: 6 (ref 5.0–8.0)

## 2015-07-01 LAB — TROPONIN I: TROPONIN I: 0.03 ng/mL (ref <0.031)

## 2015-07-01 LAB — HEMOGLOBIN AND HEMATOCRIT, BLOOD
HCT: 26.4 % — ABNORMAL LOW (ref 40.0–52.0)
Hemoglobin: 7.8 g/dL — ABNORMAL LOW (ref 13.0–18.0)

## 2015-07-01 LAB — TSH: TSH: 0.586 u[IU]/mL (ref 0.350–4.500)

## 2015-07-01 LAB — ABO/RH: ABO/RH(D): O POS

## 2015-07-01 LAB — PREPARE RBC (CROSSMATCH)

## 2015-07-01 LAB — HEMOGLOBIN A1C: Hgb A1c MFr Bld: 6.9 % — ABNORMAL HIGH (ref 4.0–6.0)

## 2015-07-01 MED ORDER — AMLODIPINE BESYLATE 5 MG PO TABS: 5.0000 mg | ORAL_TABLET | Freq: Every day | ORAL | Status: DC

## 2015-07-01 MED ORDER — SODIUM CHLORIDE 0.9 % IV SOLN
Freq: Once | INTRAVENOUS | Status: AC
  Administered 2015-07-01: 06:00:00 via INTRAVENOUS

## 2015-07-01 MED ORDER — ONDANSETRON HCL 4 MG/2ML IJ SOLN: 4.0000 mg | Freq: Four times a day (QID) | INTRAMUSCULAR | Status: DC | PRN

## 2015-07-01 MED ORDER — ONDANSETRON HCL 4 MG PO TABS: 4.0000 mg | ORAL_TABLET | Freq: Four times a day (QID) | ORAL | Status: DC | PRN

## 2015-07-01 MED ORDER — AMLODIPINE BESYLATE 10 MG PO TABS
10.0000 mg | ORAL_TABLET | Freq: Every day | ORAL | Status: DC
Start: 2015-07-01 — End: 2015-07-01
  Administered 2015-07-01: 09:00:00 10 mg via ORAL
  Filled 2015-07-01: qty 1

## 2015-07-01 MED ORDER — INSULIN ASPART 100 UNIT/ML ~~LOC~~ SOLN: 0.0000 [IU] | Freq: Every day | SUBCUTANEOUS | Status: DC

## 2015-07-01 MED ORDER — ACETAMINOPHEN 325 MG PO TABS
650.0000 mg | ORAL_TABLET | Freq: Four times a day (QID) | ORAL | Status: DC | PRN
Start: 2015-07-01 — End: 2015-07-01

## 2015-07-01 MED ORDER — SODIUM CHLORIDE 0.9 % IV BOLUS (SEPSIS)
500.0000 mL | Freq: Once | INTRAVENOUS | Status: AC
  Administered 2015-07-01: 500 mL via INTRAVENOUS

## 2015-07-01 MED ORDER — METFORMIN HCL 1000 MG PO TABS: 1000.0000 mg | ORAL_TABLET | Freq: Two times a day (BID) | ORAL | Status: DC

## 2015-07-01 MED ORDER — MORPHINE SULFATE (PF) 2 MG/ML IV SOLN: 2.0000 mg | INTRAVENOUS | Status: DC | PRN

## 2015-07-01 MED ORDER — INSULIN ASPART 100 UNIT/ML ~~LOC~~ SOLN
0.0000 [IU] | Freq: Three times a day (TID) | SUBCUTANEOUS | Status: DC
  Administered 2015-07-01: 2 [IU] via SUBCUTANEOUS
  Filled 2015-07-01: qty 2

## 2015-07-01 MED ORDER — ACETAMINOPHEN 650 MG RE SUPP
650.0000 mg | Freq: Four times a day (QID) | RECTAL | Status: DC | PRN
Start: 2015-07-01 — End: 2015-07-01

## 2015-07-01 MED ORDER — GI COCKTAIL ~~LOC~~
30.0000 mL | Freq: Once | ORAL | Status: AC
  Administered 2015-07-01: 30 mL via ORAL
  Filled 2015-07-01: qty 30

## 2015-07-01 MED ORDER — AMLODIPINE BESYLATE 10 MG PO TABS: 10.0000 mg | ORAL_TABLET | Freq: Every day | ORAL | Status: DC

## 2015-07-01 MED ORDER — DOCUSATE SODIUM 100 MG PO CAPS
100.0000 mg | ORAL_CAPSULE | Freq: Two times a day (BID) | ORAL | Status: DC
  Administered 2015-07-01: 100 mg via ORAL
  Filled 2015-07-01: qty 1

## 2015-07-01 MED ORDER — OMEPRAZOLE MAGNESIUM 20 MG PO TBEC: 20.0000 mg | DELAYED_RELEASE_TABLET | Freq: Every day | ORAL | Status: DC

## 2015-07-01 NOTE — Discharge Summary (Addendum)
[place] [facility] [place] NAME: Malik [name]    MR#:  [id number]  DATE OF BIRTH:  10-04-1945  DATE OF ADMISSION:  06/30/2015 ADMITTING PHYSICIAN: Harrie Foreman, MD  DATE OF DISCHARGE: 07/01/2015  PRIMARY CARE PHYSICIAN: [facility]    ADMISSION DIAGNOSIS:  Kidney disease [N28.9] Epigastric pain [R10.13] Essential hypertension [I10] Anemia, unspecified anemia type [D64.9]  DISCHARGE DIAGNOSIS:  Active Problems:   Anemia due to chronic kidney disease  and gastritis unspecified  SECONDARY DIAGNOSIS:   Past Medical History  Diagnosis Date  . Hypertension   . Diabetes mellitus without complication [facility])     HOSPITAL COURSE:  Malik [name]  is a 70 y.o. male admitted 06/30/2015 with chief complaint Abdominal Pain . Please see H&P performed by Harrie Foreman, MD for further information. Patient presented with the above complaints fortunately, symptoms relieved after GI cocktail. However on routine workup found to be anemic that in combination with complaints of fatigue and dyspnea on exertion he met criteria for symptomatically anemia and agreed for blood transfusion. His GI symptoms are still resolved and he feels much better after transfusion and is stable for discharge. Fecal occult blood test on hospital stay has been negative Of note patient states he is not compliant with medications  DISCHARGE CONDITIONS:   Stable  CONSULTS OBTAINED:     DRUG ALLERGIES:  No Known Allergies  DISCHARGE MEDICATIONS:   Current Discharge Medication List    START taking these medications   Details  amLODipine (NORVASC) 10 MG tablet Take 1 tablet (10 mg total) by mouth daily. Qty: 30 tablet, Refills: 0    metFORMIN (GLUCOPHAGE) 1000 MG tablet Take 1 tablet (1,000 mg total) by mouth 2 (two) times daily with a meal. Qty: 60 tablet, Refills: 0         DISCHARGE INSTRUCTIONS:    DIET:  Diabetic  diet  DISCHARGE CONDITION:  Stable  ACTIVITY:  Activity as tolerated  OXYGEN:  Home Oxygen: No.   Oxygen Delivery: room air  DISCHARGE LOCATION:  home   If you experience worsening of your admission symptoms, develop shortness of breath, life threatening emergency, suicidal or homicidal thoughts you must seek medical attention immediately by calling 911 or calling your MD immediately  if symptoms less severe.  You Must read complete instructions/literature along with all the possible adverse reactions/side effects for all the Medicines you take and that have been prescribed to you. Take any new Medicines after you have completely understood and accpet all the possible adverse reactions/side effects.   Please note  You were cared for by a hospitalist during your hospital stay. If you have any questions about your discharge medications or the care you received while you were in the hospital after you are discharged, you can call the unit and asked to speak with the hospitalist on call if the hospitalist that took care of you is not available. Once you are discharged, your primary care physician will handle any further medical issues. Please note that NO REFILLS for any discharge medications will be authorized once you are discharged, as it is imperative that you return to your primary care physician (or establish a relationship with a primary care physician if you do not have one) for your aftercare needs so that they can reassess your need for medications and monitor your lab values.    On the day of Discharge:   VITAL SIGNS:  Blood pressure 167/65, pulse  59, temperature 97.6 F (36.4 C), temperature source Oral, resp. rate 18, height '5\' 11"'  (1.803 m), weight 96.662 kg (213 lb 1.6 oz), SpO2 100 %.  I/O:  No intake or output data in the 24 hours ending 07/01/15 0837  PHYSICAL EXAMINATION:  GENERAL:  70 y.o.-year-old patient lying in the bed with no acute distress.  EYES: Pupils  equal, round, reactive to light and accommodation. No scleral icterus. Extraocular muscles intact.  HEENT: Head atraumatic, normocephalic. Oropharynx and nasopharynx clear.  NECK:  Supple, no jugular venous distention. No thyroid enlargement, no tenderness.  LUNGS: Normal breath sounds bilaterally, no wheezing, rales,rhonchi or crepitation. No use of accessory muscles of respiration.  CARDIOVASCULAR: S1, S2 normal. No murmurs, rubs, or gallops.  ABDOMEN: Soft, non-tender, non-distended. Bowel sounds present. No organomegaly or mass.  EXTREMITIES: No pedal edema, cyanosis, or clubbing.  NEUROLOGIC: Cranial nerves II through XII are intact. Muscle strength 5/5 in all extremities. Sensation intact. Gait not checked.  PSYCHIATRIC: The patient is alert and oriented x 3.  SKIN: No obvious rash, lesion, or ulcer.   DATA REVIEW:   CBC  Recent Labs Lab 06/30/15 2214  WBC 8.4  HGB 6.8*  HCT 24.0*  PLT 317    Chemistries   Recent Labs Lab 06/30/15 2214  NA 140  K 4.0  CL 107  CO2 28  GLUCOSE 133*  BUN 21*  CREATININE 1.56*  CALCIUM 8.9  AST 25  ALT 14*  ALKPHOS 64  BILITOT 0.9    Cardiac Enzymes  Recent Labs Lab 07/01/15 0243  TROPONINI 0.03    Microbiology Results  Results for orders placed or performed in visit on 06/12/11  Wound culture     Status: None   Collection Time: 06/12/11 12:20 AM  Result Value Ref Range Status   Micro Text Report   Final       SOURCE: SCROTAL ABSCESS    ORGANISM 1                HEAVY GROWTH METHICILLIN RESISTANT STAPH.AUREUS   COMMENT                   NO ANAEROBES ISOLATED IN 4 DAYS   GRAM STAIN                FEW WHITE BLOOD CELLS   GRAM STAIN                MODERATE GRAM POSITIVE ROD IN CLUSTERS   GRAM STAIN                RARE GRAM POSITIVE ROD   ANTIBIOTIC                    ORG#1     CIPROFLOXACIN                 S         CLINDAMYCIN                   S         ERYTHROMYCIN                  R         GENTAMICIN                     S         LEVOFLOXACIN  S         LINEZOLID                     S         OXACILLIN                     R         TIGECYCLINE                   S         VANCOMYCIN                    S         CEFOXITIN SCREEN              POSITIVE  INDUCIBLE CLINDAMYCIN RESISTANNEGATIVE  TRIMETHOPRIM/SULFAMETHOXAZOLE S             RADIOLOGY:  Ct Cta Abd/pel W/cm &/or W/o Cm  07/01/2015  ADDENDUM REPORT: 07/01/2015 00:32 ADDENDUM: Addendum created to add an additional impression. The urinary bladder is decompressed. Questionable bladder wall thickening and perivesicular soft tissue stranding is likely related to decompressed state. Recommend correlation with the urinalysis to exclude urinary tract infection. Electronically Signed   By: Jeb Levering M.D.   On: 07/01/2015 00:32  07/01/2015  CLINICAL DATA:  Generalized abdominal pain for 1 week. Clinical concern for abdominal aortic aneurysm. EXAM: CTA ABDOMEN AND PELVIS wITHOUT AND WITH CONTRAST TECHNIQUE: Multidetector CT imaging of the abdomen and pelvis was performed using the standard protocol during bolus administration of intravenous contrast. Multiplanar reconstructed images and MIPs were obtained and reviewed to evaluate the vascular anatomy. CONTRAST:  75 cc Isovue 370 IV COMPARISON:  CT 09/05/2008 FINDINGS: Lower chest: The included lung bases are clear. Heart is normal in size. Aorta: Moderate atherosclerosis of the abdominal aorta and its branches without aneurysm. No evidence of aortic dissection. Celiac, superior mesenteric, and inferior mesenteric arteries are patent. Two left and single right renal arteries are patent. No periaortic soft tissue stranding. Liver: Punctate granuloma. No suspicious lesion. Probable punctate granuloma adjacent to the gallbladder fossa. Hepatobiliary: Gallbladder physiologically distended, no calcified stone. No biliary dilatation. Pancreas: No ductal dilatation or inflammation. Spleen: Normal.  Adrenal glands: No nodule. Kidneys: Symmetric renal enhancement. No hydronephrosis. There are multiple bilateral renal cysts. Punctate nonobstructing stone in the upper left kidney. Stomach/Bowel: Stomach physiologically distended. There are no dilated or thickened small bowel loops. Small volume of stool throughout the colon without colonic wall thickening. The appendix is normal. Vascular/Lymphatic: No retroperitoneal adenopathy. There is a retro aortic left renal vein. Mesenteric venous system is patent. Reproductive: Prostate gland mildly prominent. Bladder: Decompressed, question of wall thickening and perivesicular stranding. Other: No free air, free fluid, or intra-abdominal fluid collection. Tiny fat containing umbilical hernia. Musculoskeletal: There are no acute or suspicious osseous abnormalities. Mild degenerative change in the lumbar spine and both hips. Review of the MIP images confirms the above findings. IMPRESSION: 1. Moderate atherosclerosis of the abdominal aorta without aneurysm or acute aortic abnormality. 2. No acute abnormality in the abdomen/pelvis. 3. Punctate nonobstructing left nephrolithiasis. Electronically Signed: By: Jeb Levering M.D. On: 07/01/2015 00:22     Management plans discussed with the patient, family and they are in agreement.  CODE STATUS:     Code Status Orders        Start     Ordered   07/01/15 0526  Full code   Continuous  07/01/15 0525    Code Status History    Date Active Date Inactive Code Status Order ID Comments User Context   This patient has a current code status but no historical code status.      TOTAL TIME TAKING CARE OF THIS PATIENT: 28 minutes.    Hower,  Karenann Cai.D on 07/01/2015 at 8:37 AM  Between 7am to 6pm - Pager - [phone]  After 6pm go to [email or web address] - [employer] [facility]  Office  [phone]  CC: Primary care physician; [facility]  Department

## 2015-07-01 NOTE — H&P (Signed)
Malik [name] is an 70 y.o. male.   Chief Complaint: Abdominal pain HPI: The patient presents to the emergency department complaining of abdominal pain that began tonight. He states that he has a generalized burning sensation of his upper abdomen and left upper quadrant that occasionally radiates substernally. He denies chest pain or shortness of breath at rest but he admits to dyspnea on exertion. The latter has been present for months. In the emergency department he received a GI cocktail which significantly relieved his abdominal pain. Laboratory evaluation revealed significant anemia. He is guaiac negative. The patient reports that he was diagnosed with anemia a very long time ago. Past medical history is also significant for hypertension and glucose intolerance. The patient has been prescribed medications but he states that he has not taken them for a very long time because he does not like taking medication. Due to symptomatic anemia the emergency department staff called for admission.  Past Medical History  Diagnosis Date  . Hypertension   . Diabetes mellitus without complication [facility])     Past Surgical History  Procedure Laterality Date  . Back surgery      Family History  Problem Relation Age of Onset  . Hypertension Mother   . Hypertension     Social History:  reports that he has quit smoking. He has never used smokeless tobacco. He reports that he drinks alcohol. He reports that he does not use illicit drugs.  Allergies: No Known Allergies  No prescriptions prior to admission    Results for orders placed or performed during the hospital encounter of 06/30/15 (from the past 48 hour(s))  Lipase, blood     Status: None   Collection Time: 06/30/15 10:14 PM  Result Value Ref Range   Lipase 46 11 - 51 U/L  Comprehensive metabolic panel     Status: Abnormal   Collection Time: 06/30/15 10:14 PM  Result Value Ref Range   Sodium 140 135 - 145 mmol/L   Potassium 4.0 3.5 - 5.1  mmol/L   Chloride 107 101 - 111 mmol/L   CO2 28 22 - 32 mmol/L   Glucose, Bld 133 (H) 65 - 99 mg/dL   BUN 21 (H) 6 - 20 mg/dL   Creatinine, Ser 1.56 (H) 0.61 - 1.24 mg/dL   Calcium 8.9 8.9 - 10.3 mg/dL   Total Protein 8.2 (H) 6.5 - 8.1 g/dL   Albumin 3.9 3.5 - 5.0 g/dL   AST 25 15 - 41 U/L   ALT 14 (L) 17 - 63 U/L   Alkaline Phosphatase 64 38 - 126 U/L   Total Bilirubin 0.9 0.3 - 1.2 mg/dL   GFR calc non Af Amer 43 (L) >60 mL/min   GFR calc Af Amer 50 (L) >60 mL/min    Comment: (NOTE) The eGFR has been calculated using the CKD EPI equation. This calculation has not been validated in all clinical situations. eGFR's persistently <60 mL/min signify possible Chronic Kidney Disease.    Anion gap 5 5 - 15  CBC     Status: Abnormal   Collection Time: 06/30/15 10:14 PM  Result Value Ref Range   WBC 8.4 3.8 - 10.6 K/uL   RBC 3.92 (L) 4.40 - 5.90 MIL/uL   Hemoglobin 6.8 (L) 13.0 - 18.0 g/dL    Comment: RESULT REPEATED AND VERIFIED   HCT 24.0 (L) 40.0 - 52.0 %    Comment: RESULT REPEATED AND VERIFIED   MCV 61.2 (L) 80.0 - 100.0 fL   MCH  17.3 (L) 26.0 - 34.0 pg   MCHC 28.3 (L) 32.0 - 36.0 g/dL   RDW 21.9 (H) 11.5 - 14.5 %   Platelets 317 150 - 440 K/uL  Troponin I     Status: None   Collection Time: 06/30/15 10:14 PM  Result Value Ref Range   Troponin I 0.03 <0.031 ng/mL    Comment:        NO INDICATION OF MYOCARDIAL INJURY.   TSH     Status: None   Collection Time: 06/30/15 10:14 PM  Result Value Ref Range   TSH 0.586 0.350 - 4.500 uIU/mL  Urinalysis complete, with microscopic ([facility] only)     Status: Abnormal   Collection Time: 06/30/15 11:38 PM  Result Value Ref Range   Color, Urine YELLOW (A) YELLOW   APPearance CLEAR (A) CLEAR   Glucose, UA NEGATIVE NEGATIVE mg/dL   Bilirubin Urine NEGATIVE NEGATIVE   Ketones, ur NEGATIVE NEGATIVE mg/dL   Specific Gravity, Urine 1.018 1.005 - 1.030   Hgb urine dipstick NEGATIVE NEGATIVE   pH 6.0 5.0 - 8.0   Protein, ur NEGATIVE  NEGATIVE mg/dL   Nitrite NEGATIVE NEGATIVE   Leukocytes, UA NEGATIVE NEGATIVE   RBC / HPF 0-5 0 - 5 RBC/hpf   WBC, UA NONE SEEN 0 - 5 WBC/hpf   Bacteria, UA NONE SEEN NONE SEEN   Squamous Epithelial / LPF NONE SEEN NONE SEEN  Troponin I     Status: None   Collection Time: 07/01/15  2:43 AM  Result Value Ref Range   Troponin I 0.03 <0.031 ng/mL    Comment:        NO INDICATION OF MYOCARDIAL INJURY.   Type and screen [place]     Status: None (Preliminary result)   Collection Time: 07/01/15  3:46 AM  Result Value Ref Range   ABO/RH(D) O POS    Antibody Screen NEG    Sample Expiration 07/04/2015    Unit Number [id number]    Blood Component Type RED CELLS,LR    Unit division 00    Status of Unit ALLOCATED    Transfusion Status OK TO TRANSFUSE    Crossmatch Result Compatible    Unit Number [id number]    Blood Component Type RED CELLS,LR    Unit division 00    Status of Unit ISSUED    Transfusion Status OK TO TRANSFUSE    Crossmatch Result Compatible   ABO/Rh     Status: None   Collection Time: 07/01/15  3:46 AM  Result Value Ref Range   ABO/RH(D) O POS   Prepare RBC     Status: None   Collection Time: 07/01/15  6:00 AM  Result Value Ref Range   Order Confirmation ORDER PROCESSED BY BLOOD BANK    Ct Cta Abd/pel W/cm &/or W/o Cm  07/01/2015  ADDENDUM REPORT: 07/01/2015 00:32 ADDENDUM: Addendum created to add an additional impression. The urinary bladder is decompressed. Questionable bladder wall thickening and perivesicular soft tissue stranding is likely related to decompressed state. Recommend correlation with the urinalysis to exclude urinary tract infection. Electronically Signed   By: Jeb Levering M.D.   On: 07/01/2015 00:32  07/01/2015  CLINICAL DATA:  Generalized abdominal pain for 1 week. Clinical concern for abdominal aortic aneurysm. EXAM: CTA ABDOMEN AND PELVIS wITHOUT AND WITH CONTRAST TECHNIQUE: Multidetector CT imaging of the  abdomen and pelvis was performed using the standard protocol during bolus administration of intravenous contrast. Multiplanar reconstructed images and MIPs were  obtained and reviewed to evaluate the vascular anatomy. CONTRAST:  75 cc Isovue 370 IV COMPARISON:  CT 09/05/2008 FINDINGS: Lower chest: The included lung bases are clear. Heart is normal in size. Aorta: Moderate atherosclerosis of the abdominal aorta and its branches without aneurysm. No evidence of aortic dissection. Celiac, superior mesenteric, and inferior mesenteric arteries are patent. Two left and single right renal arteries are patent. No periaortic soft tissue stranding. Liver: Punctate granuloma. No suspicious lesion. Probable punctate granuloma adjacent to the gallbladder fossa. Hepatobiliary: Gallbladder physiologically distended, no calcified stone. No biliary dilatation. Pancreas: No ductal dilatation or inflammation. Spleen: Normal. Adrenal glands: No nodule. Kidneys: Symmetric renal enhancement. No hydronephrosis. There are multiple bilateral renal cysts. Punctate nonobstructing stone in the upper left kidney. Stomach/Bowel: Stomach physiologically distended. There are no dilated or thickened small bowel loops. Small volume of stool throughout the colon without colonic wall thickening. The appendix is normal. Vascular/Lymphatic: No retroperitoneal adenopathy. There is a retro aortic left renal vein. Mesenteric venous system is patent. Reproductive: Prostate gland mildly prominent. Bladder: Decompressed, question of wall thickening and perivesicular stranding. Other: No free air, free fluid, or intra-abdominal fluid collection. Tiny fat containing umbilical hernia. Musculoskeletal: There are no acute or suspicious osseous abnormalities. Mild degenerative change in the lumbar spine and both hips. Review of the MIP images confirms the above findings. IMPRESSION: 1. Moderate atherosclerosis of the abdominal aorta without aneurysm or acute aortic  abnormality. 2. No acute abnormality in the abdomen/pelvis. 3. Punctate nonobstructing left nephrolithiasis. Electronically Signed: By: Jeb Levering M.D. On: 07/01/2015 00:22    Review of Systems  Constitutional: Negative for fever and chills.  HENT: Negative for sore throat and tinnitus.   Eyes: Negative for blurred vision and redness.  Respiratory: Negative for cough and shortness of breath.        Dyspnea on exertion  Cardiovascular: Negative for chest pain, palpitations, orthopnea and PND.  Gastrointestinal: Positive for abdominal pain. Negative for nausea, vomiting and diarrhea.  Genitourinary: Negative for dysuria, urgency and frequency.  Musculoskeletal: Negative for myalgias and joint pain.  Skin: Negative for rash.       No lesions  Neurological: Negative for speech change, focal weakness and weakness.  Endo/Heme/Allergies: Does not bruise/bleed easily.       No temperature intolerance  Psychiatric/Behavioral: Negative for depression and suicidal ideas.    Blood pressure 177/76, pulse 70, temperature 98.3 F (36.8 C), temperature source Oral, resp. rate 20, height _0  (1.803 m), weight 96.662 kg (213 lb 1.6 oz), SpO2 100 %. Physical Exam  Nursing note and vitals reviewed. Constitutional: He is oriented to person, place, and time. He appears well-developed and well-nourished. No distress.  HENT:  Head: Normocephalic and atraumatic.  Mouth/Throat: Oropharynx is clear and moist.  Eyes: Conjunctivae and EOM are normal. Pupils are equal, round, and reactive to light. No scleral icterus.  Neck: Normal range of motion. Neck supple. No JVD present. No tracheal deviation present. No thyromegaly present.  Cardiovascular: Normal rate, regular rhythm and normal heart sounds.  Exam reveals no gallop and no friction rub.   No murmur heard. Respiratory: Effort normal and breath sounds normal. No respiratory distress.  GI: Soft. Bowel sounds are normal. He exhibits no distension.  There is no tenderness.  Genitourinary:  Deferred  Musculoskeletal: Normal range of motion. He exhibits no edema.  Lymphadenopathy:    He has no cervical adenopathy.  Neurological: He is alert and oriented to person, place, and time. No cranial nerve deficit.  Skin: Skin is warm and dry. No rash noted. No erythema.  Psychiatric: He has a normal mood and affect. His behavior is normal. Judgment and thought content normal.     Assessment/Plan This is a 70 year old male admitted for symptomatic anemia. 1. Symptomatic anemia: Hemoglobin is 6.8; dyspnea on exertion likely secondary to decreased oxygen carrying capacity. The patient has been consented for blood transfusion and received 2 units packed red blood cells to achieve minimum hemoglobin of 8 and the presence of CKD. 2. Chronic kidney disease: Stage III; avoid nephrotoxic agents. I have given the patient 500 mL bolus in the emergency department. He will receive additional fluid with his blood transfusion. Given his unknown cardiovascular function we will wait and see if the patient needs more fluid until after transfusion is complete. 3. Essential hypertension: The patient does not know the names of his blood pressure medications. I started him on amlodipine 5 mg for uncontrolled blood pressure. 4. Glucose intolerance: The patient reports being on a "sugar pill" which I assume is metformin, although I am reluctant to start him on oral hypoglycemics at this time. Check hemoglobin A1c. I started patient on sliding scale insulin while hospitalized. 5. Abdominal pain: Resolved. Likely gastritis. Symptomatic relief as needed 6. DVT prophylaxis: None 7. GI prophylaxis: H2 blocker as needed The patient is a full code. Time spent on admission orders and patient care proximally 45 minutes  Harrie Foreman, MD 07/01/2015, 6:54 AM

## 2015-07-01 NOTE — Progress Notes (Signed)
Received MD order to discharge patient to home, reviewed discharge instructions, home meds and prescriptions with patient and he verbalized understanding discharged with home with nursing friend picked him up

## 2015-07-02 LAB — TYPE AND SCREEN
ABO/RH(D): O POS
Antibody Screen: NEGATIVE
UNIT DIVISION: 0
UNIT DIVISION: 0

## 2015-07-03 LAB — GLUCOSE, CAPILLARY: Glucose-Capillary: 162 mg/dL — ABNORMAL HIGH (ref 65–99)

## 2015-08-25 ENCOUNTER — Emergency Department
Admission: EM | Admit: 2015-08-25 | Discharge: 2015-08-25 | Disposition: A | Discharge status: Home / Self Care | Payer: Medicare Other | Attending: Emergency Medicine | Admitting: Emergency Medicine

## 2015-08-25 ENCOUNTER — Encounter: Payer: Self-pay | Admitting: Urgent Care

## 2015-08-25 DIAGNOSIS — Z87891 Personal history of nicotine dependence: Secondary | ICD-10-CM | POA: Insufficient documentation

## 2015-08-25 DIAGNOSIS — E119 Type 2 diabetes mellitus without complications: Secondary | ICD-10-CM | POA: Insufficient documentation

## 2015-08-25 DIAGNOSIS — I10 Essential (primary) hypertension: Secondary | ICD-10-CM | POA: Insufficient documentation

## 2015-08-25 DIAGNOSIS — Z7984 Long term (current) use of oral hypoglycemic drugs: Secondary | ICD-10-CM | POA: Insufficient documentation

## 2015-08-25 DIAGNOSIS — Z79899 Other long term (current) drug therapy: Secondary | ICD-10-CM | POA: Diagnosis not present

## 2015-08-25 DIAGNOSIS — H6011 Cellulitis of right external ear: Secondary | ICD-10-CM | POA: Diagnosis not present

## 2015-08-25 DIAGNOSIS — H9201 Otalgia, right ear: Secondary | ICD-10-CM | POA: Diagnosis present

## 2015-08-25 MED ORDER — CEPHALEXIN 500 MG PO CAPS
500.0000 mg | ORAL_CAPSULE | Freq: Once | ORAL | Status: AC
  Administered 2015-08-25: 500 mg via ORAL
  Filled 2015-08-25: qty 1

## 2015-08-25 MED ORDER — CIPROFLOXACIN HCL 500 MG PO TABS: 500.0000 mg | ORAL_TABLET | Freq: Two times a day (BID) | ORAL | Status: AC

## 2015-08-25 MED ORDER — SULFAMETHOXAZOLE-TRIMETHOPRIM 400-80 MG/5ML IV SOLN
160.0000 mg | Freq: Once | INTRAVENOUS | Status: AC
  Administered 2015-08-25: 160 mg via INTRAVENOUS
  Filled 2015-08-25: qty 10

## 2015-08-25 MED ORDER — SULFAMETHOXAZOLE-TRIMETHOPRIM 800-160 MG PO TABS: 1.0000 | ORAL_TABLET | Freq: Two times a day (BID) | ORAL | Status: AC

## 2015-08-25 MED ORDER — CIPROFLOXACIN HCL 500 MG PO TABS
500.0000 mg | ORAL_TABLET | Freq: Once | ORAL | Status: AC
  Administered 2015-08-25: 500 mg via ORAL
  Filled 2015-08-25: qty 1

## 2015-08-25 NOTE — ED Notes (Signed)
OK to increase rate to 352ml/hr per MD, should run in over 45 minutes.

## 2015-08-25 NOTE — ED Notes (Signed)
Discussed IV options with patient, AC placement preferred. 

## 2015-08-25 NOTE — ED Notes (Addendum)
Patient presents with c/o pain and swelling to his RIGHT external ear since yesterday. Significant swelling noted to the pinna of the RIGHT ear; mainly over the antitragus and lobe. Denies injury; cannot recall being stung or bitten by any insects. No pain within the auditory canal itself, no otorrhea, or changes in his ability to hear sounds. (+) erythema and warmth noted to area on initial exam in triage.

## 2015-08-25 NOTE — ED Provider Notes (Signed)
[facility] Emergency Department Provider Note  ____________________________________________  Time seen: 3:30 AM  I have reviewed the triage vital signs and the nursing notes.   HISTORY  Chief Complaint Otalgia and Facial Swelling      HPI Malik [name] is a 70 y.o. male resents a one-day history of right external ear pain swelling and redness. Patient denies any trauma to the ear including insect bites. Patient states current pain score is 5 out of 10. Patient denies any change in hearing. Of note patient has a history of diabetes     Past Medical History  Diagnosis Date  . Hypertension   . Diabetes mellitus without complication [facility])     Patient Active Problem List   Diagnosis Date Noted  . 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    Past Surgical History  Procedure Laterality Date  . Back surgery      Current Outpatient Rx  Name  Route  Sig  Dispense  Refill  . amLODipine (NORVASC) 10 MG tablet   Oral   Take 1 tablet (10 mg total) by mouth daily.   30 tablet   0   . ciprofloxacin (CIPRO) 500 MG tablet   Oral   Take 1 tablet (500 mg total) by mouth 2 (two) times daily.   20 tablet   0   . metFORMIN (GLUCOPHAGE) 1000 MG tablet   Oral   Take 1 tablet (1,000 mg total) by mouth 2 (two) times daily with a meal.   60 tablet   0   . omeprazole (PRILOSEC OTC) 20 MG tablet   Oral   Take 1 tablet (20 mg total) by mouth daily.   30 tablet   0   . sulfamethoxazole-trimethoprim (BACTRIM DS,SEPTRA DS) 800-160 MG tablet   Oral   Take 1 tablet by mouth 2 (two) times daily.   20 tablet   0     Allergies No known drug allergies  Family History  Problem Relation Age of Onset  . Hypertension Mother   . Hypertension      Social History Social History  Substance Use Topics  . Smoking status: Former Research scientist (life sciences)  . Smokeless tobacco: Never Used  . Alcohol Use: Yes    Review of Systems  Constitutional: Negative for  fever. Eyes: Negative for visual changes. ENT: Negative for sore throat. Cardiovascular: Negative for chest pain. Respiratory: Negative for shortness of breath. Gastrointestinal: Negative for abdominal pain, vomiting and diarrhea. Genitourinary: Negative for dysuria. Musculoskeletal: Negative for back pain. Skin: Negative for rash.Positive for right ear pain swelling and redness Neurological: Negative for headaches, focal weakness or numbness.   10-point ROS otherwise negative.  ____________________________________________   PHYSICAL EXAM:  VITAL SIGNS: ED Triage Vitals  Enc Vitals Group     BP --      Pulse Rate 08/25/15 0156 76     Resp 08/25/15 0156 18     Temp 08/25/15 0156 99 F (37.2 C)     Temp Source 08/25/15 0156 Oral     SpO2 08/25/15 0156 96 %     Weight 08/25/15 0156 220 lb (99.791 kg)     Height 08/25/15 0156 5\' 10"  (1.778 m)     Head Cir --      Peak Flow --      Pain Score 08/25/15 0158 5     Pain Loc --      Pain Edu? --      Excl. in [place]? --  Constitutional: Alert and oriented. Well appearing and in no distress. Eyes: Conjunctivae are normal. PERRL. Normal extraocular movements. ENT   Head: Normocephalic and atraumatic.   Nose: No congestion/rhinnorhea.   Mouth/Throat: Mucous membranes are moist.   Neck: No stridor. Hematological/Lymphatic/Immunilogical: No cervical lymphadenopathy. Cardiovascular: Normal rate, regular rhythm. Normal and symmetric distal pulses are present in all extremities. No murmurs, rubs, or gallops. Respiratory: Normal respiratory effort without tachypnea nor retractions. Breath sounds are clear and equal bilaterally. No wheezes/rales/rhonchi. Gastrointestinal: Soft and nontender. No distention. There is no CVA tenderness. Genitourinary: deferred Musculoskeletal: Nontender with normal range of motion in all extremities. No joint effusions.  No lower extremity tenderness nor edema. Neurologic:  Normal speech and  language. No gross focal neurologic deficits are appreciated. Speech is normal.  Skin:  Right ear erythema and swelling noted no pain with palpation of the mastoid process. Psychiatric: Mood and affect are normal. Speech and behavior are normal. Patient exhibits appropriate insight and judgment.      INITIAL IMPRESSION / ASSESSMENT AND PLAN / ED COURSE  Pertinent labs & imaging results that were available during my care of the patient were reviewed by me and considered in my medical decision making (see chart for details).  Patient given IV Bactrim and Cipro in the emergency department with concern for right external ear cellulitis possible polychondritis  ____________________________________________   FINAL CLINICAL IMPRESSION(S) / ED DIAGNOSES  Final diagnoses:  Cellulitis of ear, right      Gregor Hams, MD 08/25/15 [phone]

## 2015-08-25 NOTE — Discharge Instructions (Signed)

## 2016-03-28 ENCOUNTER — Emergency Department
Admission: EM | Admit: 2016-03-28 | Discharge: 2016-03-28 | Disposition: A | Discharge status: Home / Self Care | Payer: Medicare Other | Attending: Emergency Medicine | Admitting: Emergency Medicine

## 2016-03-28 ENCOUNTER — Encounter: Payer: Self-pay | Admitting: Emergency Medicine

## 2016-03-28 ENCOUNTER — Emergency Department: Payer: Medicare Other

## 2016-03-28 DIAGNOSIS — Y999 Unspecified external cause status: Secondary | ICD-10-CM | POA: Diagnosis not present

## 2016-03-28 DIAGNOSIS — Z87891 Personal history of nicotine dependence: Secondary | ICD-10-CM | POA: Diagnosis not present

## 2016-03-28 DIAGNOSIS — M25552 Pain in left hip: Secondary | ICD-10-CM | POA: Diagnosis not present

## 2016-03-28 DIAGNOSIS — Z79899 Other long term (current) drug therapy: Secondary | ICD-10-CM | POA: Diagnosis not present

## 2016-03-28 DIAGNOSIS — N189 Chronic kidney disease, unspecified: Secondary | ICD-10-CM | POA: Insufficient documentation

## 2016-03-28 DIAGNOSIS — S299XXA Unspecified injury of thorax, initial encounter: Secondary | ICD-10-CM | POA: Diagnosis present

## 2016-03-28 DIAGNOSIS — R0781 Pleurodynia: Secondary | ICD-10-CM | POA: Insufficient documentation

## 2016-03-28 DIAGNOSIS — E1122 Type 2 diabetes mellitus with diabetic chronic kidney disease: Secondary | ICD-10-CM | POA: Insufficient documentation

## 2016-03-28 DIAGNOSIS — Y92410 Unspecified street and highway as the place of occurrence of the external cause: Secondary | ICD-10-CM | POA: Insufficient documentation

## 2016-03-28 DIAGNOSIS — Z7984 Long term (current) use of oral hypoglycemic drugs: Secondary | ICD-10-CM | POA: Insufficient documentation

## 2016-03-28 DIAGNOSIS — I129 Hypertensive chronic kidney disease with stage 1 through stage 4 chronic kidney disease, or unspecified chronic kidney disease: Secondary | ICD-10-CM | POA: Diagnosis not present

## 2016-03-28 DIAGNOSIS — M546 Pain in thoracic spine: Secondary | ICD-10-CM | POA: Diagnosis not present

## 2016-03-28 DIAGNOSIS — Y9389 Activity, other specified: Secondary | ICD-10-CM | POA: Insufficient documentation

## 2016-03-28 MED ORDER — CYCLOBENZAPRINE HCL 5 MG PO TABS: 5.0000 mg | ORAL_TABLET | ORAL | 0 refills | Status: AC
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
## 2016-04-27 ENCOUNTER — Encounter: Payer: Self-pay | Admitting: Emergency Medicine

## 2016-04-27 ENCOUNTER — Emergency Department: Payer: No Typology Code available for payment source

## 2016-04-27 ENCOUNTER — Emergency Department
Admission: EM | Admit: 2016-04-27 | Discharge: 2016-04-27 | Disposition: A | Discharge status: Home / Self Care | Payer: No Typology Code available for payment source | Attending: Emergency Medicine | Admitting: Emergency Medicine

## 2016-04-27 DIAGNOSIS — Y999 Unspecified external cause status: Secondary | ICD-10-CM | POA: Diagnosis not present

## 2016-04-27 DIAGNOSIS — Z79899 Other long term (current) drug therapy: Secondary | ICD-10-CM | POA: Insufficient documentation

## 2016-04-27 DIAGNOSIS — Z87891 Personal history of nicotine dependence: Secondary | ICD-10-CM | POA: Insufficient documentation

## 2016-04-27 DIAGNOSIS — M25511 Pain in right shoulder: Secondary | ICD-10-CM | POA: Diagnosis not present

## 2016-04-27 DIAGNOSIS — Z7984 Long term (current) use of oral hypoglycemic drugs: Secondary | ICD-10-CM | POA: Diagnosis not present

## 2016-04-27 DIAGNOSIS — I10 Essential (primary) hypertension: Secondary | ICD-10-CM | POA: Insufficient documentation

## 2016-04-27 DIAGNOSIS — Y9389 Activity, other specified: Secondary | ICD-10-CM | POA: Insufficient documentation

## 2016-04-27 DIAGNOSIS — Y92410 Unspecified street and highway as the place of occurrence of the external cause: Secondary | ICD-10-CM | POA: Insufficient documentation

## 2016-04-27 DIAGNOSIS — E119 Type 2 diabetes mellitus without complications: Secondary | ICD-10-CM | POA: Diagnosis not present

## 2016-04-27 DIAGNOSIS — S4991XA Unspecified injury of right shoulder and upper arm, initial encounter: Secondary | ICD-10-CM | POA: Diagnosis present

## 2016-04-27 MED ORDER — HYDROCODONE-ACETAMINOPHEN 5-325 MG PO TABS: 1.0000 | ORAL_TABLET | ORAL | 0 refills | Status: DC | PRN

## 2016-04-27 NOTE — Discharge Instructions (Signed)
Follow-up with your primary care doctor if any continued problems. Take Norco as needed for pain. Do not take this medication while driving as this may cause drowsiness and increase your risk for injury or falling. You may use ice to your shoulder as needed to reduce pain. Follow-up with [facility] clinic if any continued problems. You may also follow-up with Dr. Mack Guise if any continued problems with your shoulder.
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
## 2016-12-06 ENCOUNTER — Other Ambulatory Visit
Admission: AD | Admit: 2016-12-06 | Discharge: 2016-12-06 | Disposition: A | Discharge status: Home / Self Care | Attending: Family Medicine | Admitting: Family Medicine

## 2016-12-07 NOTE — ED Notes (Signed)
Pt brought in by [employer] for forensic blood draw. This RN used betadine swab and glass blood vile's provided by [employer] officer, [place]. This RN used facilities butterfly needle, 2x2 swab, tourniquet and tape. Both vile's handed to officer post draw.

## 2017-05-10 ENCOUNTER — Emergency Department (HOSPITAL_COMMUNITY): Payer: No Typology Code available for payment source | Admitting: Anesthesiology

## 2017-05-10 ENCOUNTER — Inpatient Hospital Stay (HOSPITAL_COMMUNITY)
Admission: EM | Admit: 2017-05-10 | Discharge: 2017-05-27 | DRG: 799 | Disposition: A | Discharge status: Rehabilitation Facility | Payer: No Typology Code available for payment source | Attending: General Surgery | Admitting: General Surgery

## 2017-05-10 ENCOUNTER — Emergency Department (HOSPITAL_COMMUNITY): Payer: No Typology Code available for payment source

## 2017-05-10 ENCOUNTER — Encounter (HOSPITAL_COMMUNITY): Admission: EM | Disposition: A | Discharge status: Rehabilitation Facility | Payer: Self-pay | Source: Home / Self Care

## 2017-05-10 ENCOUNTER — Encounter (HOSPITAL_COMMUNITY): Payer: Self-pay

## 2017-05-10 ENCOUNTER — Other Ambulatory Visit: Payer: Self-pay

## 2017-05-10 DIAGNOSIS — R5381 Other malaise: Secondary | ICD-10-CM | POA: Diagnosis not present

## 2017-05-10 DIAGNOSIS — E870 Hyperosmolality and hypernatremia: Secondary | ICD-10-CM | POA: Diagnosis present

## 2017-05-10 DIAGNOSIS — I340 Nonrheumatic mitral (valve) insufficiency: Secondary | ICD-10-CM | POA: Diagnosis present

## 2017-05-10 DIAGNOSIS — S80211A Abrasion, right knee, initial encounter: Secondary | ICD-10-CM | POA: Diagnosis present

## 2017-05-10 DIAGNOSIS — Z9689 Presence of other specified functional implants: Secondary | ICD-10-CM

## 2017-05-10 DIAGNOSIS — Z23 Encounter for immunization: Secondary | ICD-10-CM

## 2017-05-10 DIAGNOSIS — J9811 Atelectasis: Secondary | ICD-10-CM | POA: Diagnosis present

## 2017-05-10 DIAGNOSIS — Y92410 Unspecified street and highway as the place of occurrence of the external cause: Secondary | ICD-10-CM | POA: Diagnosis not present

## 2017-05-10 DIAGNOSIS — Z01818 Encounter for other preprocedural examination: Secondary | ICD-10-CM

## 2017-05-10 DIAGNOSIS — S36031A Moderate laceration of spleen, initial encounter: Secondary | ICD-10-CM | POA: Diagnosis present

## 2017-05-10 DIAGNOSIS — R0989 Other specified symptoms and signs involving the circulatory and respiratory systems: Secondary | ICD-10-CM | POA: Diagnosis not present

## 2017-05-10 DIAGNOSIS — I130 Hypertensive heart and chronic kidney disease with heart failure and stage 1 through stage 4 chronic kidney disease, or unspecified chronic kidney disease: Secondary | ICD-10-CM | POA: Diagnosis present

## 2017-05-10 DIAGNOSIS — F10231 Alcohol dependence with withdrawal delirium: Secondary | ICD-10-CM | POA: Diagnosis not present

## 2017-05-10 DIAGNOSIS — J90 Pleural effusion, not elsewhere classified: Secondary | ICD-10-CM

## 2017-05-10 DIAGNOSIS — G4730 Sleep apnea, unspecified: Secondary | ICD-10-CM | POA: Diagnosis present

## 2017-05-10 DIAGNOSIS — S060X0A Concussion without loss of consciousness, initial encounter: Secondary | ICD-10-CM | POA: Diagnosis present

## 2017-05-10 DIAGNOSIS — S2249XA Multiple fractures of ribs, unspecified side, initial encounter for closed fracture: Secondary | ICD-10-CM

## 2017-05-10 DIAGNOSIS — J189 Pneumonia, unspecified organism: Secondary | ICD-10-CM

## 2017-05-10 DIAGNOSIS — E861 Hypovolemia: Secondary | ICD-10-CM | POA: Diagnosis not present

## 2017-05-10 DIAGNOSIS — S36039A Unspecified laceration of spleen, initial encounter: Secondary | ICD-10-CM | POA: Diagnosis present

## 2017-05-10 DIAGNOSIS — Y901 Blood alcohol level of 20-39 mg/100 ml: Secondary | ICD-10-CM | POA: Diagnosis present

## 2017-05-10 DIAGNOSIS — S299XXA Unspecified injury of thorax, initial encounter: Secondary | ICD-10-CM

## 2017-05-10 DIAGNOSIS — D6489 Other specified anemias: Secondary | ICD-10-CM | POA: Diagnosis not present

## 2017-05-10 DIAGNOSIS — J948 Other specified pleural conditions: Secondary | ICD-10-CM

## 2017-05-10 DIAGNOSIS — M25511 Pain in right shoulder: Secondary | ICD-10-CM

## 2017-05-10 DIAGNOSIS — D72829 Elevated white blood cell count, unspecified: Secondary | ICD-10-CM | POA: Diagnosis not present

## 2017-05-10 DIAGNOSIS — S2239XA Fracture of one rib, unspecified side, initial encounter for closed fracture: Secondary | ICD-10-CM

## 2017-05-10 DIAGNOSIS — J942 Hemothorax: Secondary | ICD-10-CM

## 2017-05-10 DIAGNOSIS — Z781 Physical restraint status: Secondary | ICD-10-CM

## 2017-05-10 DIAGNOSIS — S2242XA Multiple fractures of ribs, left side, initial encounter for closed fracture: Secondary | ICD-10-CM | POA: Diagnosis present

## 2017-05-10 DIAGNOSIS — G479 Sleep disorder, unspecified: Secondary | ICD-10-CM | POA: Diagnosis not present

## 2017-05-10 DIAGNOSIS — T17990A Other foreign object in respiratory tract, part unspecified in causing asphyxiation, initial encounter: Secondary | ICD-10-CM | POA: Diagnosis not present

## 2017-05-10 DIAGNOSIS — F05 Delirium due to known physiological condition: Secondary | ICD-10-CM | POA: Diagnosis not present

## 2017-05-10 DIAGNOSIS — N390 Urinary tract infection, site not specified: Secondary | ICD-10-CM | POA: Diagnosis not present

## 2017-05-10 DIAGNOSIS — I10 Essential (primary) hypertension: Secondary | ICD-10-CM

## 2017-05-10 DIAGNOSIS — K659 Peritonitis, unspecified: Secondary | ICD-10-CM | POA: Diagnosis present

## 2017-05-10 DIAGNOSIS — N179 Acute kidney failure, unspecified: Secondary | ICD-10-CM | POA: Diagnosis not present

## 2017-05-10 DIAGNOSIS — E1165 Type 2 diabetes mellitus with hyperglycemia: Secondary | ICD-10-CM | POA: Diagnosis not present

## 2017-05-10 DIAGNOSIS — K567 Ileus, unspecified: Secondary | ICD-10-CM | POA: Diagnosis not present

## 2017-05-10 DIAGNOSIS — Z4659 Encounter for fitting and adjustment of other gastrointestinal appliance and device: Secondary | ICD-10-CM

## 2017-05-10 DIAGNOSIS — I5032 Chronic diastolic (congestive) heart failure: Secondary | ICD-10-CM | POA: Diagnosis present

## 2017-05-10 DIAGNOSIS — T1490XA Injury, unspecified, initial encounter: Secondary | ICD-10-CM | POA: Diagnosis not present

## 2017-05-10 DIAGNOSIS — S36899A Unspecified injury of other intra-abdominal organs, initial encounter: Secondary | ICD-10-CM

## 2017-05-10 DIAGNOSIS — I420 Dilated cardiomyopathy: Secondary | ICD-10-CM | POA: Diagnosis present

## 2017-05-10 DIAGNOSIS — S069X9D Unspecified intracranial injury with loss of consciousness of unspecified duration, subsequent encounter: Secondary | ICD-10-CM | POA: Diagnosis not present

## 2017-05-10 DIAGNOSIS — D473 Essential (hemorrhagic) thrombocythemia: Secondary | ICD-10-CM | POA: Diagnosis not present

## 2017-05-10 DIAGNOSIS — D62 Acute posthemorrhagic anemia: Secondary | ICD-10-CM | POA: Diagnosis present

## 2017-05-10 DIAGNOSIS — E1122 Type 2 diabetes mellitus with diabetic chronic kidney disease: Secondary | ICD-10-CM | POA: Diagnosis present

## 2017-05-10 DIAGNOSIS — E11649 Type 2 diabetes mellitus with hypoglycemia without coma: Secondary | ICD-10-CM | POA: Diagnosis not present

## 2017-05-10 DIAGNOSIS — Z9081 Acquired absence of spleen: Secondary | ICD-10-CM | POA: Diagnosis not present

## 2017-05-10 DIAGNOSIS — S3600XD Unspecified injury of spleen, subsequent encounter: Secondary | ICD-10-CM | POA: Diagnosis not present

## 2017-05-10 DIAGNOSIS — T07XXXA Unspecified multiple injuries, initial encounter: Secondary | ICD-10-CM | POA: Diagnosis not present

## 2017-05-10 DIAGNOSIS — J9601 Acute respiratory failure with hypoxia: Secondary | ICD-10-CM | POA: Diagnosis not present

## 2017-05-10 DIAGNOSIS — I169 Hypertensive crisis, unspecified: Secondary | ICD-10-CM | POA: Diagnosis not present

## 2017-05-10 DIAGNOSIS — S069X1S Unspecified intracranial injury with loss of consciousness of 30 minutes or less, sequela: Secondary | ICD-10-CM | POA: Diagnosis not present

## 2017-05-10 DIAGNOSIS — E785 Hyperlipidemia, unspecified: Secondary | ICD-10-CM | POA: Diagnosis present

## 2017-05-10 DIAGNOSIS — K660 Peritoneal adhesions (postprocedural) (postinfection): Secondary | ICD-10-CM | POA: Diagnosis present

## 2017-05-10 DIAGNOSIS — E119 Type 2 diabetes mellitus without complications: Secondary | ICD-10-CM | POA: Diagnosis not present

## 2017-05-10 DIAGNOSIS — N183 Chronic kidney disease, stage 3 (moderate): Secondary | ICD-10-CM | POA: Diagnosis present

## 2017-05-10 DIAGNOSIS — S069X9S Unspecified intracranial injury with loss of consciousness of unspecified duration, sequela: Secondary | ICD-10-CM | POA: Diagnosis not present

## 2017-05-10 HISTORY — DX: Heart disease, unspecified: I51.9

## 2017-05-10 HISTORY — DX: Polyneuropathy, unspecified: G62.9

## 2017-05-10 HISTORY — DX: Sleep apnea, unspecified: G47.30

## 2017-05-10 HISTORY — DX: Type 2 diabetes mellitus without complications: E11.9

## 2017-05-10 HISTORY — DX: Chronic diastolic (congestive) heart failure: I50.32

## 2017-05-10 HISTORY — DX: Unspecified intracranial injury with loss of consciousness status unknown, initial encounter: S06.9XAA

## 2017-05-10 HISTORY — DX: Essential (primary) hypertension: I10

## 2017-05-10 HISTORY — DX: Other forms of angina pectoris: I20.89

## 2017-05-10 HISTORY — DX: Unspecified intracranial injury with loss of consciousness of unspecified duration, initial encounter: S06.9X9A

## 2017-05-10 HISTORY — DX: Other forms of angina pectoris: I20.8

## 2017-05-10 HISTORY — DX: Dilated cardiomyopathy: I42.0

## 2017-05-10 HISTORY — PX: SPLENECTOMY, TOTAL: SHX788

## 2017-05-10 HISTORY — DX: Nonrheumatic mitral (valve) insufficiency: I34.0

## 2017-05-10 HISTORY — DX: Hypertensive heart disease without heart failure: I11.9

## 2017-05-10 LAB — CBC
HEMATOCRIT: 33.4 % — AB (ref 39.0–52.0)
HEMOGLOBIN: 10 g/dL — AB (ref 13.0–17.0)
MCH: 22.9 pg — AB (ref 26.0–34.0)
MCHC: 29.9 g/dL — AB (ref 30.0–36.0)
MCV: 76.6 fL — ABNORMAL LOW (ref 78.0–100.0)
Platelets: 251 10*3/uL (ref 150–400)
RBC: 4.36 MIL/uL (ref 4.22–5.81)
RDW: 17.9 % — AB (ref 11.5–15.5)
WBC: 14.8 10*3/uL — ABNORMAL HIGH (ref 4.0–10.5)

## 2017-05-10 LAB — I-STAT CHEM 8, ED
BUN: 15 mg/dL (ref 6–20)
CALCIUM ION: 1.05 mmol/L — AB (ref 1.15–1.40)
CHLORIDE: 107 mmol/L (ref 101–111)
Creatinine, Ser: 1.3 mg/dL — ABNORMAL HIGH (ref 0.61–1.24)
Glucose, Bld: 196 mg/dL — ABNORMAL HIGH (ref 65–99)
HEMATOCRIT: 33 % — AB (ref 39.0–52.0)
Hemoglobin: 11.2 g/dL — ABNORMAL LOW (ref 13.0–17.0)
Potassium: 3.9 mmol/L (ref 3.5–5.1)
SODIUM: 140 mmol/L (ref 135–145)
TCO2: 21 mmol/L — AB (ref 22–32)

## 2017-05-10 LAB — COMPREHENSIVE METABOLIC PANEL
ALBUMIN: 3.2 g/dL — AB (ref 3.5–5.0)
ALT: 18 U/L (ref 17–63)
ANION GAP: 12 (ref 5–15)
AST: 31 U/L (ref 15–41)
Alkaline Phosphatase: 69 U/L (ref 38–126)
BUN: 13 mg/dL (ref 6–20)
CO2: 19 mmol/L — ABNORMAL LOW (ref 22–32)
Calcium: 8.3 mg/dL — ABNORMAL LOW (ref 8.9–10.3)
Chloride: 106 mmol/L (ref 101–111)
Creatinine, Ser: 1.3 mg/dL — ABNORMAL HIGH (ref 0.61–1.24)
GFR calc Af Amer: >60 mL/min (ref >60)
GFR calc non Af Amer: 53 mL/min — ABNORMAL LOW (ref >60)
GLUCOSE: 202 mg/dL — AB (ref 65–99)
POTASSIUM: 3.7 mmol/L (ref 3.5–5.1)
Sodium: 137 mmol/L (ref 135–145)
Total Bilirubin: 0.6 mg/dL (ref 0.3–1.2)
Total Protein: 7.2 g/dL (ref 6.5–8.1)

## 2017-05-10 LAB — PROTIME-INR
INR: 1.08
PROTHROMBIN TIME: 13.9 s (ref 11.4–15.2)

## 2017-05-10 LAB — I-STAT CG4 LACTIC ACID, ED: LACTIC ACID, VENOUS: 4.21 mmol/L — AB (ref 0.5–1.9)

## 2017-05-10 LAB — GLUCOSE, CAPILLARY
GLUCOSE-CAPILLARY: 152 mg/dL — AB (ref 65–99)
GLUCOSE-CAPILLARY: 238 mg/dL — AB (ref 65–99)

## 2017-05-10 LAB — ETHANOL: Alcohol, Ethyl (B): 37 mg/dL — ABNORMAL HIGH (ref <10)

## 2017-05-10 LAB — CDS SEROLOGY

## 2017-05-10 LAB — SAMPLE TO BLOOD BANK

## 2017-05-10 SURGERY — SPLENECTOMY
Anesthesia: General | Site: Abdomen

## 2017-05-10 MED ORDER — PANTOPRAZOLE SODIUM 40 MG IV SOLR
40.0000 mg | Freq: Every day | INTRAVENOUS | Status: DC
  Administered 2017-05-10 – 2017-05-19 (×9): 40 mg via INTRAVENOUS
  Filled 2017-05-10 (×9): qty 40

## 2017-05-10 MED ORDER — 0.9 % SODIUM CHLORIDE (POUR BTL) OPTIME
TOPICAL | Status: DC | PRN
  Administered 2017-05-10: 1000 mL
  Administered 2017-05-10: 3000 mL

## 2017-05-10 MED ORDER — SUCCINYLCHOLINE CHLORIDE 200 MG/10ML IV SOSY
PREFILLED_SYRINGE | INTRAVENOUS | Status: DC | PRN
  Administered 2017-05-10: 100 mg via INTRAVENOUS

## 2017-05-10 MED ORDER — HYDRALAZINE HCL 20 MG/ML IJ SOLN
INTRAMUSCULAR | Status: DC | PRN
  Administered 2017-05-10 (×4): 5 mg via INTRAVENOUS

## 2017-05-10 MED ORDER — FENTANYL CITRATE (PF) 250 MCG/5ML IJ SOLN
INTRAMUSCULAR | Status: AC
  Filled 2017-05-10: qty 5

## 2017-05-10 MED ORDER — ROCURONIUM BROMIDE 10 MG/ML (PF) SYRINGE
PREFILLED_SYRINGE | INTRAVENOUS | Status: DC | PRN
  Administered 2017-05-10: 60 mg via INTRAVENOUS

## 2017-05-10 MED ORDER — IOPAMIDOL (ISOVUE-300) INJECTION 61%
INTRAVENOUS | Status: AC
  Administered 2017-05-10: 100 mL
  Filled 2017-05-10: qty 100

## 2017-05-10 MED ORDER — METHOCARBAMOL 1000 MG/10ML IJ SOLN
500.0000 mg | Freq: Four times a day (QID) | INTRAVENOUS | Status: DC | PRN
  Administered 2017-05-11 – 2017-05-12 (×3): 500 mg via INTRAVENOUS
  Filled 2017-05-10 (×7): qty 5

## 2017-05-10 MED ORDER — OXYCODONE HCL 5 MG PO TABS
5.0000 mg | ORAL_TABLET | ORAL | Status: DC | PRN
  Administered 2017-05-11 (×2): 5 mg via ORAL
  Filled 2017-05-10 (×2): qty 1

## 2017-05-10 MED ORDER — PANTOPRAZOLE SODIUM 40 MG PO TBEC
40.0000 mg | DELAYED_RELEASE_TABLET | Freq: Every day | ORAL | Status: DC
  Administered 2017-05-20 – 2017-05-27 (×8): 40 mg via ORAL
  Filled 2017-05-10 (×8): qty 1

## 2017-05-10 MED ORDER — PROPOFOL 10 MG/ML IV BOLUS
INTRAVENOUS | Status: DC | PRN
  Administered 2017-05-10: 80 mg via INTRAVENOUS

## 2017-05-10 MED ORDER — HYDROMORPHONE HCL 1 MG/ML IJ SOLN
0.5000 mg | INTRAMUSCULAR | Status: DC | PRN
  Administered 2017-05-10 – 2017-05-12 (×7): 0.5 mg via INTRAVENOUS
  Filled 2017-05-10 (×7): qty 0.5

## 2017-05-10 MED ORDER — LACTATED RINGERS IV SOLN
INTRAVENOUS | Status: DC | PRN
  Administered 2017-05-10 (×2): via INTRAVENOUS

## 2017-05-10 MED ORDER — DEXAMETHASONE SODIUM PHOSPHATE 10 MG/ML IJ SOLN
INTRAMUSCULAR | Status: DC | PRN
  Administered 2017-05-10: 10 mg via INTRAVENOUS

## 2017-05-10 MED ORDER — HYDRALAZINE HCL 20 MG/ML IJ SOLN
10.0000 mg | INTRAMUSCULAR | Status: AC | PRN
  Administered 2017-05-11 – 2017-05-16 (×4): 10 mg via INTRAVENOUS
  Filled 2017-05-10 (×4): qty 1

## 2017-05-10 MED ORDER — ALBUMIN HUMAN 5 % IV SOLN
INTRAVENOUS | Status: DC | PRN
  Administered 2017-05-10 (×2): via INTRAVENOUS

## 2017-05-10 MED ORDER — ONDANSETRON HCL 4 MG/2ML IJ SOLN
4.0000 mg | Freq: Four times a day (QID) | INTRAMUSCULAR | Status: DC | PRN
  Administered 2017-05-12 (×2): 4 mg via INTRAVENOUS
  Filled 2017-05-10 (×2): qty 2

## 2017-05-10 MED ORDER — SODIUM CHLORIDE 0.9 % IV SOLN
INTRAVENOUS | Status: DC
  Administered 2017-05-10 – 2017-05-14 (×7): via INTRAVENOUS

## 2017-05-10 MED ORDER — MIDAZOLAM HCL 2 MG/2ML IJ SOLN
INTRAMUSCULAR | Status: DC | PRN
  Administered 2017-05-10: 2 mg via INTRAVENOUS

## 2017-05-10 MED ORDER — MIDAZOLAM HCL 2 MG/2ML IJ SOLN
INTRAMUSCULAR | Status: AC
  Filled 2017-05-10: qty 2

## 2017-05-10 MED ORDER — SUGAMMADEX SODIUM 200 MG/2ML IV SOLN
INTRAVENOUS | Status: DC | PRN
  Administered 2017-05-10: 200 mg via INTRAVENOUS

## 2017-05-10 MED ORDER — LACTATED RINGERS IV SOLN
INTRAVENOUS | Status: DC | PRN
  Administered 2017-05-10: 14:00:00 via INTRAVENOUS

## 2017-05-10 MED ORDER — ONDANSETRON 4 MG PO TBDP: 4.0000 mg | ORAL_TABLET | Freq: Four times a day (QID) | ORAL | Status: DC | PRN

## 2017-05-10 MED ORDER — CEFAZOLIN SODIUM-DEXTROSE 2-3 GM-%(50ML) IV SOLR
INTRAVENOUS | Status: DC | PRN
Start: 2017-05-10 — End: 2017-05-10
  Administered 2017-05-10: 2 g via INTRAVENOUS

## 2017-05-10 MED ORDER — CEFAZOLIN SODIUM-DEXTROSE 2-4 GM/100ML-% IV SOLN
2.0000 g | Freq: Once | INTRAVENOUS | Status: AC
  Administered 2017-05-10: 2 g via INTRAVENOUS

## 2017-05-10 MED ORDER — GABAPENTIN 300 MG PO CAPS
300.0000 mg | ORAL_CAPSULE | Freq: Three times a day (TID) | ORAL | Status: DC
Start: 2017-05-10 — End: 2017-05-13
  Administered 2017-05-11 (×2): 300 mg via ORAL
  Filled 2017-05-10 (×3): qty 1

## 2017-05-10 MED ORDER — DOCUSATE SODIUM 100 MG PO CAPS: 100.0000 mg | ORAL_CAPSULE | Freq: Two times a day (BID) | ORAL | Status: DC

## 2017-05-10 MED ORDER — FENTANYL CITRATE (PF) 100 MCG/2ML IJ SOLN
INTRAMUSCULAR | Status: AC | PRN
  Administered 2017-05-10: 100 ug via INTRAVENOUS

## 2017-05-10 MED ORDER — BISACODYL 10 MG RE SUPP
10.0000 mg | Freq: Every day | RECTAL | Status: DC | PRN
  Administered 2017-05-17: 10 mg via RECTAL
  Filled 2017-05-10: qty 1

## 2017-05-10 MED ORDER — FENTANYL CITRATE (PF) 250 MCG/5ML IJ SOLN
INTRAMUSCULAR | Status: DC | PRN
  Administered 2017-05-10 (×8): 50 ug via INTRAVENOUS
  Administered 2017-05-10: 100 ug via INTRAVENOUS

## 2017-05-10 MED ORDER — FENTANYL CITRATE (PF) 100 MCG/2ML IJ SOLN
INTRAMUSCULAR | Status: AC
  Filled 2017-05-10: qty 2

## 2017-05-10 MED ORDER — PHENYLEPHRINE 40 MCG/ML (10ML) SYRINGE FOR IV PUSH (FOR BLOOD PRESSURE SUPPORT)
PREFILLED_SYRINGE | INTRAVENOUS | Status: AC
  Filled 2017-05-10: qty 10

## 2017-05-10 MED ORDER — CEFAZOLIN SODIUM-DEXTROSE 2-4 GM/100ML-% IV SOLN
INTRAVENOUS | Status: AC
  Filled 2017-05-10: qty 100

## 2017-05-10 MED ORDER — ACETAMINOPHEN 325 MG PO TABS
650.0000 mg | ORAL_TABLET | Freq: Four times a day (QID) | ORAL | Status: DC
  Administered 2017-05-11 (×2): 650 mg via ORAL
  Filled 2017-05-10 (×2): qty 2

## 2017-05-10 MED ORDER — PROPOFOL 10 MG/ML IV BOLUS
INTRAVENOUS | Status: AC
  Filled 2017-05-10: qty 20

## 2017-05-10 MED ORDER — METOPROLOL TARTRATE 5 MG/5ML IV SOLN
5.0000 mg | Freq: Four times a day (QID) | INTRAVENOUS | Status: DC | PRN
  Administered 2017-05-15 – 2017-05-17 (×3): 5 mg via INTRAVENOUS
  Filled 2017-05-10 (×4): qty 5

## 2017-05-10 MED ORDER — PHENYLEPHRINE HCL 10 MG/ML IJ SOLN: INTRAVENOUS | Status: DC | PRN

## 2017-05-10 MED ORDER — HEMOSTATIC AGENTS (NO CHARGE) OPTIME
TOPICAL | Status: DC | PRN
  Administered 2017-05-10: 1 via TOPICAL

## 2017-05-10 SURGICAL SUPPLY — 37 items
APPLIER CLIP 13 LRG OPEN (CLIP) ×3
BLADE CLIPPER SURG (BLADE) ×3 IMPLANT
CANISTER SUCT 3000ML PPV (MISCELLANEOUS) ×3 IMPLANT
CHLORAPREP W/TINT 26ML (MISCELLANEOUS) ×3 IMPLANT
CLIP APPLIE 13 LRG OPEN (CLIP) ×1 IMPLANT
COVER SURGICAL LIGHT HANDLE (MISCELLANEOUS) ×3 IMPLANT
DRAIN CHANNEL 19F RND (DRAIN) ×3 IMPLANT
DRAPE LAPAROSCOPIC ABDOMINAL (DRAPES) ×3 IMPLANT
DRSG OPSITE POSTOP 4X10 (GAUZE/BANDAGES/DRESSINGS) ×3 IMPLANT
ELECT BLADE 6.5 EXT (BLADE) ×3 IMPLANT
ELECT REM PT RETURN 9FT ADLT (ELECTROSURGICAL) ×3
ELECTRODE REM PT RTRN 9FT ADLT (ELECTROSURGICAL) ×1 IMPLANT
EVACUATOR SILICONE 100CC (DRAIN) ×3 IMPLANT
GAUZE SPONGE 4X4 12PLY STRL (GAUZE/BANDAGES/DRESSINGS) ×3 IMPLANT
GLOVE BIOGEL PI IND STRL 8 (GLOVE) ×1 IMPLANT
GLOVE BIOGEL PI INDICATOR 8 (GLOVE) ×2
GLOVE ECLIPSE 7.5 STRL STRAW (GLOVE) ×3 IMPLANT
GOWN STRL REUS W/ TWL LRG LVL3 (GOWN DISPOSABLE) ×2 IMPLANT
GOWN STRL REUS W/TWL LRG LVL3 (GOWN DISPOSABLE) ×4
HEMOSTAT SNOW SURGICEL 2X4 (HEMOSTASIS) ×3 IMPLANT
KIT BASIN OR (CUSTOM PROCEDURE TRAY) ×3 IMPLANT
KIT ROOM TURNOVER OR (KITS) ×3 IMPLANT
NS IRRIG 1000ML POUR BTL (IV SOLUTION) ×9 IMPLANT
PACK GENERAL/GYN (CUSTOM PROCEDURE TRAY) ×3 IMPLANT
PAD ARMBOARD 7.5X6 YLW CONV (MISCELLANEOUS) ×6 IMPLANT
SPECIMEN JAR X LARGE (MISCELLANEOUS) ×3 IMPLANT
SPONGE LAP 18X18 X RAY DECT (DISPOSABLE) ×6 IMPLANT
STAPLER VISISTAT 35W (STAPLE) ×6 IMPLANT
SUCTION POOLE TIP (SUCTIONS) ×3 IMPLANT
SUT ETHILON 2 0 FS 18 (SUTURE) ×3 IMPLANT
SUT PDS AB 1 TP1 96 (SUTURE) ×6 IMPLANT
SUT SILK 0 TIES 10X30 (SUTURE) ×3 IMPLANT
SUT SILK 2 0 TIES 10X30 (SUTURE) ×3 IMPLANT
SUT SILK 2 0SH CR/8 30 (SUTURE) ×3 IMPLANT
TAPE CLOTH SURG 4X10 WHT LF (GAUZE/BANDAGES/DRESSINGS) ×3 IMPLANT
TOWEL OR 17X26 10 PK STRL BLUE (TOWEL DISPOSABLE) ×3 IMPLANT
TRAY FOLEY W/METER SILVER 14FR (SET/KITS/TRAYS/PACK) ×3 IMPLANT
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
## 2017-05-10 NOTE — Op Note (Signed)
Operative Note  Malik [name]  [id number]  [id number]  05/10/2017   Surgeon: Victorino Sparrow ConnorMD  Assistant: Adonis Housekeeper, MD  Procedure performed: exploratory laparotomy, splenectomy  Preop diagnosis: hemoperitoneum, spleen injury Post-op diagnosis/intraop findings: same  Specimens: spleen  Retained items: 59fr round blake drain EBL: [id number] Complications: none  Description of procedure: After obtaining informed consent the patient was taken to the operating room and placed supine on operating room table wheregeneral endotracheal anesthesia was initiated, preoperative antibiotics were administered, SCDs applied, and a formal timeout was performed. The abdomen was prepped and draped in usual sterile fashion. A midline laparotomy was created sharply and the soft tissues dissected with cautery until linea alba could be identified and divided. The peritoneal cavity was carefully entered. The left upper quadrant was packed with laparotomy pads and a large volume of hemoperitoneum was evacuated. There did not appear to be bleeding coming from anywhere besides the left upper quadrant. The spleen was bluntly freed from its attachments to the peritoneum and brought up into the field. Short gastric vessels were divided between [facility] and suture ligated with 2-0 silk ties. Splenic pedicle was then able to be occluded with a Claiborne Billings and divided. The spleen was removed and passed off the field. Pedicle was suture ligated with 2-0 silk and then this was reinforced with a 2-0 silk tie. Left upper quadrant was inspected for hemostasis. There was a small amount of oozing coming from the anterior sutures at surface of the tail of the pancreas which was controlled with sutures and cautery. There is small amount of residual splenic capsule stuck in the peritoneum posteriorly which was hemostatic and left in situ. The short gastric vessels were reinforced with clips. The bowel was then run from the ligament  of Treitz all the way to the ileocecal valve and found to be free of injury. The colon also appeared free of injury after inspecting the ascending, transverse descending sigmoid colon and rectum. Some omental adhesions to the right anterior abdominal wall were taken down with cautery. The stomach was inspected and found to be free of injury as was the liver. The NG tube was confirmed to be intragastric. A piece of Surgicel Emogene Morgan was placed over the oozing area on the anterior surface of the pancreas. A 19 French round Blake drain was introduced to the right hemiabdomen and directed to sit up and the splenic bed and over the surface of the pancreas. This was secured to skin with a 2-0 nylon. The abdomen was copiously irrigated with several liters of warm sterile saline and once again reinspected for hemostasis which was found to be excellent. The fascia was closed with running looped #1 PDS and the skin was closed staples. A honeycomb dressing was then applied. The patient was then awakened, extubated and taken to PACU in stable condition.   All counts were correct at the completion of the case.

## 2017-05-10 NOTE — Anesthesia Postprocedure Evaluation (Signed)
Anesthesia Post Note  Patient: Malik [name]  Procedure(s) Performed: TRAUMA EXPLORATORY LAP FOR SPLENECTOMY (N/A Abdomen)     Patient location during evaluation: PACU Anesthesia Type: General Level of consciousness: awake and alert Pain management: pain level controlled Vital Signs Assessment: post-procedure vital signs reviewed and stable Respiratory status: spontaneous breathing, nonlabored ventilation, respiratory function stable and patient connected to nasal cannula oxygen Cardiovascular status: blood pressure returned to baseline and stable Postop Assessment: no apparent nausea or vomiting Anesthetic complications: no    Last Vitals:  Vitals:   05/10/17 1719 05/10/17 1900  BP: 137/66 (!) 156/73  Pulse: 90 100  Resp: (!) 29 (!) 29  Temp: (!) 36.3 C 36.6 C  SpO2: 100% 92%    Last Pain:  Vitals:   05/10/17 1900  TempSrc: Oral  PainSc:                  [place] [facility]
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
## 2017-05-10 NOTE — ED Notes (Signed)
Pt taken to CT.

## 2017-05-10 NOTE — Progress Notes (Signed)
Presented to patient.  Seems he is in pain.  Will remain available for support as needed.    05/10/17 1253  Clinical Encounter Type  Visited With Patient;Health care provider
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
## 2017-05-10 NOTE — Progress Notes (Signed)
Orthopedic Tech Progress Note Patient Details:  Malik [name] 11-20-1945 [id number]  Patient ID: [name], male   DOB: 1945-07-02, 72 y.o.   MRN: [id number]   Hildred Priest 05/10/2017, 10:40 AM Made level 2 trauma visit

## 2017-05-10 NOTE — ED Notes (Signed)
Pt back from cT
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
## 2017-05-10 NOTE — ED Notes (Signed)
Dr Kae Heller at bedside

## 2017-05-10 NOTE — Anesthesia Procedure Notes (Signed)
Procedure Name: Intubation Date/Time: 05/10/2017 2:27 PM Performed by: Clearnce Sorrel, CRNA Pre-anesthesia Checklist: Patient identified, Emergency Drugs available, Suction available, Patient being monitored and Timeout performed Patient Re-evaluated:Patient Re-evaluated prior to induction Oxygen Delivery Method: Circle system utilized Preoxygenation: Pre-oxygenation with 100% oxygen Induction Type: IV induction, Rapid sequence and Cricoid Pressure applied Laryngoscope Size: Mac and 4 Grade View: Grade II Tube type: Subglottic suction tube Tube size: 7.5 mm Number of attempts: 1 Airway Equipment and Method: Stylet Placement Confirmation: positive ETCO2 and breath sounds checked- equal and bilateral Secured at: 22 cm Tube secured with: Tape Dental Injury: Teeth and Oropharynx as per pre-operative assessment
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
## 2017-05-11 ENCOUNTER — Encounter (HOSPITAL_COMMUNITY): Payer: Self-pay | Admitting: Surgery

## 2017-05-11 ENCOUNTER — Inpatient Hospital Stay (HOSPITAL_COMMUNITY): Payer: No Typology Code available for payment source

## 2017-05-11 LAB — PREPARE RBC (CROSSMATCH)

## 2017-05-11 LAB — CBC
HCT: 22.4 % — ABNORMAL LOW (ref 39.0–52.0)
HEMOGLOBIN: 6.7 g/dL — AB (ref 13.0–17.0)
MCH: 22.7 pg — AB (ref 26.0–34.0)
MCHC: 29.9 g/dL — AB (ref 30.0–36.0)
MCV: 75.9 fL — ABNORMAL LOW (ref 78.0–100.0)
PLATELETS: 238 10*3/uL (ref 150–400)
RBC: 2.95 MIL/uL — AB (ref 4.22–5.81)
RDW: 17.9 % — ABNORMAL HIGH (ref 11.5–15.5)
WBC: 16.9 10*3/uL — ABNORMAL HIGH (ref 4.0–10.5)

## 2017-05-11 LAB — COMPREHENSIVE METABOLIC PANEL
ALK PHOS: 53 U/L (ref 38–126)
ALT: 21 U/L (ref 17–63)
ANION GAP: 9 (ref 5–15)
AST: 53 U/L — ABNORMAL HIGH (ref 15–41)
Albumin: 3.1 g/dL — ABNORMAL LOW (ref 3.5–5.0)
BILIRUBIN TOTAL: 1 mg/dL (ref 0.3–1.2)
BUN: 30 mg/dL — ABNORMAL HIGH (ref 6–20)
CALCIUM: 7.6 mg/dL — AB (ref 8.9–10.3)
CO2: 20 mmol/L — ABNORMAL LOW (ref 22–32)
CREATININE: 2.71 mg/dL — AB (ref 0.61–1.24)
Chloride: 109 mmol/L (ref 101–111)
GFR, EST AFRICAN AMERICAN: 25 mL/min — AB (ref >60)
GFR, EST NON AFRICAN AMERICAN: 22 mL/min — AB (ref >60)
Glucose, Bld: 146 mg/dL — ABNORMAL HIGH (ref 65–99)
Potassium: 4.9 mmol/L (ref 3.5–5.1)
Sodium: 138 mmol/L (ref 135–145)
TOTAL PROTEIN: 6.6 g/dL (ref 6.5–8.1)

## 2017-05-11 LAB — BASIC METABOLIC PANEL
Anion gap: 10 (ref 5–15)
BUN: 25 mg/dL — AB (ref 6–20)
CO2: 19 mmol/L — ABNORMAL LOW (ref 22–32)
CREATININE: 2.49 mg/dL — AB (ref 0.61–1.24)
Calcium: 7.3 mg/dL — ABNORMAL LOW (ref 8.9–10.3)
Chloride: 106 mmol/L (ref 101–111)
GFR, EST AFRICAN AMERICAN: 28 mL/min — AB (ref >60)
GFR, EST NON AFRICAN AMERICAN: 24 mL/min — AB (ref >60)
Glucose, Bld: 204 mg/dL — ABNORMAL HIGH (ref 65–99)
Potassium: 4.9 mmol/L (ref 3.5–5.1)
SODIUM: 135 mmol/L (ref 135–145)

## 2017-05-11 LAB — GLUCOSE, CAPILLARY
GLUCOSE-CAPILLARY: 146 mg/dL — AB (ref 65–99)
GLUCOSE-CAPILLARY: 118 mg/dL — AB (ref 65–99)
Glucose-Capillary: 122 mg/dL — ABNORMAL HIGH (ref 65–99)
Glucose-Capillary: 128 mg/dL — ABNORMAL HIGH (ref 65–99)

## 2017-05-11 LAB — MRSA PCR SCREENING: MRSA BY PCR: NEGATIVE

## 2017-05-11 LAB — HEMOGLOBIN A1C
Hgb A1c MFr Bld: 6.3 % — ABNORMAL HIGH (ref 4.8–5.6)
Mean Plasma Glucose: 134.11 mg/dL

## 2017-05-11 LAB — ABO/RH: ABO/RH(D): O POS

## 2017-05-11 MED ORDER — SODIUM CHLORIDE 0.9 % IV SOLN
Freq: Once | INTRAVENOUS | Status: AC
  Administered 2017-05-11: 10:00:00 via INTRAVENOUS

## 2017-05-11 MED ORDER — INSULIN ASPART 100 UNIT/ML ~~LOC~~ SOLN
0.0000 [IU] | SUBCUTANEOUS | Status: DC
  Administered 2017-05-11 – 2017-05-12 (×3): 2 [IU] via SUBCUTANEOUS
  Administered 2017-05-12: 1 [IU] via SUBCUTANEOUS
  Administered 2017-05-12 (×2): 2 [IU] via SUBCUTANEOUS
  Administered 2017-05-12: 3 [IU] via SUBCUTANEOUS
  Administered 2017-05-12 – 2017-05-14 (×7): 2 [IU] via SUBCUTANEOUS
  Administered 2017-05-15 (×2): 3 [IU] via SUBCUTANEOUS
  Administered 2017-05-15: 2 [IU] via SUBCUTANEOUS
  Administered 2017-05-15 – 2017-05-16 (×3): 3 [IU] via SUBCUTANEOUS
  Administered 2017-05-16 – 2017-05-17 (×3): 2 [IU] via SUBCUTANEOUS
  Administered 2017-05-17 (×2): 3 [IU] via SUBCUTANEOUS
  Administered 2017-05-18 (×3): 2 [IU] via SUBCUTANEOUS
  Administered 2017-05-18: 3 [IU] via SUBCUTANEOUS
  Administered 2017-05-18: 2 [IU] via SUBCUTANEOUS
  Administered 2017-05-19: 8 [IU] via SUBCUTANEOUS
  Administered 2017-05-19: 3 [IU] via SUBCUTANEOUS
  Administered 2017-05-19 (×2): 2 [IU] via SUBCUTANEOUS
  Administered 2017-05-20 – 2017-05-21 (×4): 3 [IU] via SUBCUTANEOUS
  Administered 2017-05-21: 2 [IU] via SUBCUTANEOUS
  Administered 2017-05-21: 5 [IU] via SUBCUTANEOUS

## 2017-05-11 MED ORDER — SODIUM CHLORIDE 0.9 % IV BOLUS (SEPSIS)
500.0000 mL | Freq: Once | INTRAVENOUS | Status: AC
Start: 2017-05-11 — End: 2017-05-11
  Administered 2017-05-11: 500 mL via INTRAVENOUS
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
## 2017-05-11 NOTE — Progress Notes (Signed)
OT Cancellation    05/11/17 1000  OT Visit Information  Last OT Received On 05/11/17  Reason Eval/Treat Not Completed Medical issues which prohibited therapy. Pt Hgb below 7. Receiving blood transfusion. RN states he will be receiving second unit of blood later and may participate in therapy after 4:30. Will return as schedule allows today or tomorrow. Thank you.   [place], OTR/L Acute Rehab Pager: [phone] Office: [phone]

## 2017-05-11 NOTE — Progress Notes (Signed)
Second unit of blood administered, no adverse reactions.

## 2017-05-11 NOTE — Progress Notes (Signed)
PT Cancellation Note  Patient Details Name: Malik [name] MRN: [id number] DOB: [birth year]   Cancelled Treatment:    Reason Eval/Treat Not Completed: Patient not medically ready. Pt Hgb below 7. Receiving blood transfusion. RN states he will be receiving second unit of blood later and may participate in therapy after 4:30. Will return as schedule allows today or tomorrow. Thank you.   Thelma Comp 05/11/2017, 11:17 AM   Rolinda Roan, PT, DPT Acute Rehabilitation Services Pager: [phone]

## 2017-05-11 NOTE — Progress Notes (Signed)
CRITICAL VALUE ALERT  Critical Value:  Hgb 6.7  Date & Time Notied:  0720  Provider Notified: Margie Billet   Orders Received/Actions taken: 1 Unit PRBC   Also notified on-call provider of urine output of only 226mL 7p-7a despite patient receiving NS @125mL . BUN 25 and Creatinine 2.49. Order received for 53ml Bolus NS.
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
## 2017-05-11 NOTE — Progress Notes (Signed)
Visited with patient and son and other friend.  Had prayer with them for healing and peace during this time and for staff in their work. Conard Novak, Chaplain   05/11/17 1400  Clinical Encounter Type  Visited With Patient and family together  Visit Type Initial;Spiritual support  Referral From Nurse  Consult/Referral To Chaplain  Spiritual Encounters  Spiritual Needs Prayer;Emotional

## 2017-05-11 NOTE — Progress Notes (Signed)
First unit of blood administered, no adverse reactions.  Preparation for the 2nd unit completed, waiting on 2nd unit for transfuse.

## 2017-05-12 LAB — GLUCOSE, CAPILLARY
GLUCOSE-CAPILLARY: 206 mg/dL — AB (ref 65–99)
GLUCOSE-CAPILLARY: 132 mg/dL — AB (ref 65–99)
Glucose-Capillary: 162 mg/dL — ABNORMAL HIGH (ref 65–99)
Glucose-Capillary: 141 mg/dL — ABNORMAL HIGH (ref 65–99)
Glucose-Capillary: 123 mg/dL — ABNORMAL HIGH (ref 65–99)
Glucose-Capillary: 120 mg/dL — ABNORMAL HIGH (ref 65–99)
Glucose-Capillary: 133 mg/dL — ABNORMAL HIGH (ref 65–99)

## 2017-05-12 LAB — TYPE AND SCREEN
ABO/RH(D): O POS
Antibody Screen: NEGATIVE
UNIT DIVISION: 0
Unit division: 0

## 2017-05-12 LAB — COMPREHENSIVE METABOLIC PANEL
ALBUMIN: 2.8 g/dL — AB (ref 3.5–5.0)
ALT: 24 U/L (ref 17–63)
AST: 51 U/L — AB (ref 15–41)
Alkaline Phosphatase: 50 U/L (ref 38–126)
Anion gap: 7 (ref 5–15)
BUN: 33 mg/dL — AB (ref 6–20)
CHLORIDE: 112 mmol/L — AB (ref 101–111)
CO2: 21 mmol/L — AB (ref 22–32)
CREATININE: 2.61 mg/dL — AB (ref 0.61–1.24)
Calcium: 7.8 mg/dL — ABNORMAL LOW (ref 8.9–10.3)
GFR calc Af Amer: 27 mL/min — ABNORMAL LOW (ref >60)
GFR calc non Af Amer: 23 mL/min — ABNORMAL LOW (ref >60)
Glucose, Bld: 157 mg/dL — ABNORMAL HIGH (ref 65–99)
POTASSIUM: 4.2 mmol/L (ref 3.5–5.1)
SODIUM: 140 mmol/L (ref 135–145)
Total Bilirubin: 1.2 mg/dL (ref 0.3–1.2)
Total Protein: 6.4 g/dL — ABNORMAL LOW (ref 6.5–8.1)

## 2017-05-12 LAB — BPAM RBC
BLOOD PRODUCT EXPIRATION DATE: 201903272359
Blood Product Expiration Date: 201903082359
ISSUE DATE / TIME: 201903031523
ISSUE DATE / TIME: 201903030937
UNIT TYPE AND RH: 5100
Unit Type and Rh: 9500

## 2017-05-12 LAB — CBC
HCT: 25.8 % — ABNORMAL LOW (ref 39.0–52.0)
Hemoglobin: 7.9 g/dL — ABNORMAL LOW (ref 13.0–17.0)
MCH: 24.4 pg — ABNORMAL LOW (ref 26.0–34.0)
MCHC: 30.6 g/dL (ref 30.0–36.0)
MCV: 79.6 fL (ref 78.0–100.0)
Platelets: 222 10*3/uL (ref 150–400)
RBC: 3.24 MIL/uL — AB (ref 4.22–5.81)
RDW: 18.3 % — ABNORMAL HIGH (ref 11.5–15.5)
WBC: 21.7 10*3/uL — AB (ref 4.0–10.5)

## 2017-05-12 MED ORDER — ENOXAPARIN SODIUM 40 MG/0.4ML ~~LOC~~ SOLN: 40.0000 mg | SUBCUTANEOUS | Status: DC

## 2017-05-12 MED ORDER — ACETAMINOPHEN 650 MG RE SUPP
650.0000 mg | RECTAL | Status: DC | PRN
  Administered 2017-05-19: 650 mg via RECTAL
  Filled 2017-05-12: qty 1

## 2017-05-12 MED ORDER — LORAZEPAM 2 MG/ML IJ SOLN
2.0000 mg | INTRAMUSCULAR | Status: DC | PRN
  Administered 2017-05-12 – 2017-05-15 (×2): 2 mg via INTRAVENOUS
  Filled 2017-05-12 (×2): qty 1

## 2017-05-12 MED ORDER — HYDROMORPHONE HCL 1 MG/ML IJ SOLN
0.5000 mg | INTRAMUSCULAR | Status: DC | PRN
  Administered 2017-05-12: 0.5 mg via INTRAVENOUS
  Administered 2017-05-12 (×2): 1 mg via INTRAVENOUS
  Administered 2017-05-12 – 2017-05-16 (×3): 0.5 mg via INTRAVENOUS
  Administered 2017-05-17: 1 mg via INTRAVENOUS
  Administered 2017-05-17: 0.5 mg via INTRAVENOUS
  Administered 2017-05-17 (×2): 1 mg via INTRAVENOUS
  Administered 2017-05-17 (×2): 0.5 mg via INTRAVENOUS
  Administered 2017-05-18 (×5): 1 mg via INTRAVENOUS
  Administered 2017-05-19: 0.5 mg via INTRAVENOUS
  Administered 2017-05-19 – 2017-05-21 (×7): 1 mg via INTRAVENOUS
  Filled 2017-05-12: qty 1
  Filled 2017-05-12: qty 0.5
  Filled 2017-05-12 (×5): qty 1
  Filled 2017-05-12: qty 0.5
  Filled 2017-05-12: qty 1
  Filled 2017-05-12: qty 0.5
  Filled 2017-05-12 (×8): qty 1
  Filled 2017-05-12: qty 0.5
  Filled 2017-05-12 (×6): qty 1
  Filled 2017-05-12: qty 0.5
  Filled 2017-05-12: qty 1

## 2017-05-12 MED ORDER — THIAMINE HCL 100 MG/ML IJ SOLN
100.0000 mg | Freq: Every day | INTRAMUSCULAR | Status: DC
  Administered 2017-05-12 – 2017-05-21 (×10): 100 mg via INTRAVENOUS
  Filled 2017-05-12 (×10): qty 2

## 2017-05-12 MED ORDER — FOLIC ACID 5 MG/ML IJ SOLN
1.0000 mg | Freq: Every day | INTRAMUSCULAR | Status: DC
  Administered 2017-05-12 – 2017-05-21 (×10): 1 mg via INTRAVENOUS
  Filled 2017-05-12 (×13): qty 0.2
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
## 2017-05-12 NOTE — Progress Notes (Signed)
Inpatient Rehabilitation  Per PT request, patient was screened by [facility] Monday for appropriateness for an Inpatient Acute Rehab consult.  At this time we are recommending an Inpatient Rehab consult.  Text paged MD to notify; please order if you are agreeable.    Justyn Langham, M.A., CCC/SLP Admission Coordinator  [facility]  Cell [phone]  

## 2017-05-12 NOTE — Clinical Social Work Note (Signed)
CSW spoke with pt at bedside to complete SBIRT. Pt very sleepy. Pt denies any SU/SA. Pt declines any SU/SA resources. Pt denies any flashbacks or nightmares. CIR to evaluate pt. . Clinical Social Worker will sign off for now as social work intervention is no longer needed. Please consult [place] again if new need arises.   Shelton Silvas A Ivy Meriwether 05/12/2017

## 2017-05-13 ENCOUNTER — Inpatient Hospital Stay (HOSPITAL_COMMUNITY): Payer: No Typology Code available for payment source | Admitting: Certified Registered"

## 2017-05-13 ENCOUNTER — Inpatient Hospital Stay (HOSPITAL_COMMUNITY): Payer: No Typology Code available for payment source

## 2017-05-13 LAB — BLOOD GAS, ARTERIAL
Acid-base deficit: 2.1 mmol/L — ABNORMAL HIGH (ref 0.0–2.0)
Acid-base deficit: 4.7 mmol/L — ABNORMAL HIGH (ref 0.0–2.0)
BICARBONATE: 22.5 mmol/L (ref 20.0–28.0)
Bicarbonate: 21.2 mmol/L (ref 20.0–28.0)
DRAWN BY: 345601
DRAWN BY: 51702
FIO2: 100
MECHVT: 600 mL
O2 Content: 3 L/min
O2 Saturation: 93.2 %
O2 Saturation: 81.8 %
PATIENT TEMPERATURE: 98.6
PEEP: 5 cmH2O
PO2 ART: 56.4 mmHg — AB (ref 83.0–108.0)
Patient temperature: 99.4
RATE: 16 resp/min
pCO2 arterial: 40.8 mmHg (ref 32.0–48.0)
pCO2 arterial: 49.7 mmHg — ABNORMAL HIGH (ref 32.0–48.0)
pH, Arterial: 7.36 (ref 7.350–7.450)
pH, Arterial: 7.256 — ABNORMAL LOW (ref 7.350–7.450)
pO2, Arterial: 71 mmHg — ABNORMAL LOW (ref 83.0–108.0)

## 2017-05-13 LAB — COMPREHENSIVE METABOLIC PANEL
ALBUMIN: 2.4 g/dL — AB (ref 3.5–5.0)
ALK PHOS: 57 U/L (ref 38–126)
ALT: 28 U/L (ref 17–63)
AST: 46 U/L — AB (ref 15–41)
Anion gap: 7 (ref 5–15)
BUN: 39 mg/dL — ABNORMAL HIGH (ref 6–20)
CALCIUM: 7.9 mg/dL — AB (ref 8.9–10.3)
CO2: 21 mmol/L — AB (ref 22–32)
CREATININE: 3.16 mg/dL — AB (ref 0.61–1.24)
Chloride: 116 mmol/L — ABNORMAL HIGH (ref 101–111)
GFR calc Af Amer: 21 mL/min — ABNORMAL LOW (ref >60)
GFR calc non Af Amer: 18 mL/min — ABNORMAL LOW (ref >60)
GLUCOSE: 148 mg/dL — AB (ref 65–99)
Potassium: 4.9 mmol/L (ref 3.5–5.1)
SODIUM: 144 mmol/L (ref 135–145)
Total Bilirubin: 1.1 mg/dL (ref 0.3–1.2)
Total Protein: 6 g/dL — ABNORMAL LOW (ref 6.5–8.1)

## 2017-05-13 LAB — CBC
HCT: 25.2 % — ABNORMAL LOW (ref 39.0–52.0)
HEMOGLOBIN: 7.4 g/dL — AB (ref 13.0–17.0)
MCH: 23.9 pg — ABNORMAL LOW (ref 26.0–34.0)
MCHC: 29.4 g/dL — ABNORMAL LOW (ref 30.0–36.0)
MCV: 81.3 fL (ref 78.0–100.0)
Platelets: 253 10*3/uL (ref 150–400)
RBC: 3.1 MIL/uL — AB (ref 4.22–5.81)
RDW: 18.4 % — ABNORMAL HIGH (ref 11.5–15.5)
WBC: 23.1 10*3/uL — ABNORMAL HIGH (ref 4.0–10.5)

## 2017-05-13 LAB — GLUCOSE, CAPILLARY
GLUCOSE-CAPILLARY: 142 mg/dL — AB (ref 65–99)
GLUCOSE-CAPILLARY: 134 mg/dL — AB (ref 65–99)
GLUCOSE-CAPILLARY: 123 mg/dL — AB (ref 65–99)
GLUCOSE-CAPILLARY: 134 mg/dL — AB (ref 65–99)
Glucose-Capillary: 124 mg/dL — ABNORMAL HIGH (ref 65–99)
Glucose-Capillary: 118 mg/dL — ABNORMAL HIGH (ref 65–99)

## 2017-05-13 LAB — TRIGLYCERIDES: TRIGLYCERIDES: 112 mg/dL (ref <150)

## 2017-05-13 MED ORDER — PROPOFOL 1000 MG/100ML IV EMUL
5.0000 ug/kg/min | INTRAVENOUS | Status: DC
  Administered 2017-05-13: 25 ug/kg/min via INTRAVENOUS
  Administered 2017-05-13: 40 ug/kg/min via INTRAVENOUS
  Administered 2017-05-13: 20 ug/kg/min via INTRAVENOUS
  Administered 2017-05-13: 30 ug/kg/min via INTRAVENOUS
  Administered 2017-05-14 – 2017-05-15 (×6): 35 ug/kg/min via INTRAVENOUS
  Filled 2017-05-13 (×11): qty 100

## 2017-05-13 MED ORDER — SUCCINYLCHOLINE CHLORIDE 20 MG/ML IJ SOLN
INTRAMUSCULAR | Status: DC | PRN
  Administered 2017-05-13: 100 mg via INTRAVENOUS

## 2017-05-13 MED ORDER — PROPOFOL 1000 MG/100ML IV EMUL
INTRAVENOUS | Status: AC
  Filled 2017-05-13: qty 100

## 2017-05-13 MED ORDER — NALOXONE HCL 0.4 MG/ML IJ SOLN
INTRAMUSCULAR | Status: AC
  Filled 2017-05-13: qty 1

## 2017-05-13 MED ORDER — PROPOFOL 10 MG/ML IV BOLUS
INTRAVENOUS | Status: DC | PRN
  Administered 2017-05-13: 150 mg via INTRAVENOUS

## 2017-05-13 MED ORDER — CHLORHEXIDINE GLUCONATE 0.12% ORAL RINSE (MEDLINE KIT)
15.0000 mL | Freq: Two times a day (BID) | OROMUCOSAL | Status: DC
  Administered 2017-05-13 – 2017-05-22 (×18): 15 mL via OROMUCOSAL

## 2017-05-13 MED ORDER — ORAL CARE MOUTH RINSE
15.0000 mL | OROMUCOSAL | Status: DC
  Administered 2017-05-13 – 2017-05-16 (×33): 15 mL via OROMUCOSAL

## 2017-05-13 NOTE — Progress Notes (Signed)
PT Cancellation Note  Patient Details Name: Malik [name] MRN: [id number] DOB: [birth year]   Cancelled Treatment:    Reason Eval/Treat Not Completed: Medical issues which prohibited therapy(pt with medical decline, rapid response and intubated, await stability to proceed)   Malik [name] B Malik [name] 05/13/2017, 7:16 AM  Elwyn Reach, [place]

## 2017-05-13 NOTE — Progress Notes (Signed)
SLP Cancellation Note  Patient Details Name: Malik [name] MRN: [id number] DOB: March 30, 1945   Cancelled treatment:       Reason Eval/Treat Not Completed: Medical issues which prohibited therapy.  Pt with medical decline and is now intubated.  Will follow up as is medically appropriate.    Zaul Hubers, Selinda Orion 05/13/2017, 11:48 AM

## 2017-05-13 NOTE — Progress Notes (Signed)
CXR concerning for pneumonia  ABG reviewed Poor cough and thick copious secretions No cough and minimal response to NT suctioning  Showing signs of DT 's as well despite ativan Will ask anesthesia to intubate patient and transfer to ICU for further care

## 2017-05-13 NOTE — Progress Notes (Signed)
Patient ID: Malik [name], male   DOB: [birth year], 72 y.o.   MRN: [id number] Labs reviewed. AKI is worse. Continue 0.9 NS. May need to consult Renal if worsens tomorrow. Georganna Skeans, MD, MPH, FACS Trauma: [phone] General Surgery: [phone]

## 2017-05-13 NOTE — Procedures (Signed)
Chest Tube Insertion Procedure Note  Pre-operative Diagnosis: L effusion  Post-operative Diagnosis: L effusion  Procedure Details  Medical necessity consent was obtained for the procedure. No family.   After sterile skin prep, using standard technique, a 14 French tube was placed in the left anterior axillary line above the nipple using Seldinger technique. The tube was hooked to a Pleurevac and a sterile dressing was applied.  Findings: 550cc serosanguinous fluid         Specimens:  none              Complications: none  Disposition: ICU - intubated and hemodynamically stable.         Condition: stable  Georganna Skeans, MD, MPH, FACS Trauma: [phone] General Surgery: [phone]
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
## 2017-05-13 NOTE — Significant Event (Addendum)
Rapid Response Event Note  Overview: Time Called: 0134 Arrival Time: 0140 Event Type: Neurologic, Respiratory  Initial Focused Assessment:   Upon arrival, pt was arousable to noxious stimulation, mildly labored breathing on [place].BBS rhonchorous with thick tenacious secretions.  Interventions: -PCXR  -Narcan d/t possible oversedation   Plan of Care (if not transferred): -MD called back and stated due to CXR and dec LOC will have anesthesia to intubate pt. -Anesthesia intubated patient sucessfully and PCXR done -Patient sedated with propofol infusion and monitored by RRRN until transfer to ICU -Post Intubation CXR results called to Dr. Brantley Stage.  No new orders.  Updated MD on patients current status.  Event Summary: Name of Physician Notified: Dr. Brantley Stage by primary nurse PTA at    Name of Consulting Physician Notified: Dr. Marcie Bal with Anesthesia  at [place]  Outcome: Will transfer to 4N when bed available.  Event End Time: 0300  Madelynn Done

## 2017-05-13 NOTE — Progress Notes (Signed)
Pt's son and sister have visited today.   Mohit Zirbes (son- same name) [phone]  Joseph Art (sister) [phone]  Son requested note for pt who missed a court appearance yesterday. Spoke with SW about this. Note has been written and given to son.

## 2017-05-13 NOTE — Anesthesia Procedure Notes (Signed)
Procedure Name: Intubation Date/Time: 05/13/2017 2:30 AM Performed by: Myna Bright, CRNA Pre-anesthesia Checklist: Patient identified, Emergency Drugs available, Suction available, Patient being monitored and Timeout performed Patient Re-evaluated:Patient Re-evaluated prior to induction Oxygen Delivery Method: Circle system utilized Preoxygenation: Pre-oxygenation with 100% oxygen Induction Type: IV induction and Cricoid Pressure applied Ventilation: Mask ventilation without difficulty Laryngoscope Size: Mac and 3 Grade View: Grade I Tube type: Subglottic suction tube Tube size: 7.5 mm Number of attempts: 1 Airway Equipment and Method: Stylet Placement Confirmation: ETT inserted through vocal cords under direct vision,  breath sounds checked- equal and bilateral and CO2 detector Secured at: 22 cm Tube secured with: Tape Dental Injury: Teeth and Oropharynx as per pre-operative assessment  Comments: Called to 4N room 2 for urgent intubation. VSS. Patient minimally responsive. Labs checked and WNL. Smooth IV induction. Good mask airway with cricoid pressure held throughout intervention. DL x 1. Grade I view. Atraumatic oral intubation. +BBS, + color change on CO2 detector. VSS stable post intubation. Post procedure sedation per Dr. Brantley Stage.
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
## 2017-05-14 ENCOUNTER — Inpatient Hospital Stay (HOSPITAL_COMMUNITY): Payer: No Typology Code available for payment source

## 2017-05-14 LAB — GLUCOSE, CAPILLARY
GLUCOSE-CAPILLARY: 125 mg/dL — AB (ref 65–99)
GLUCOSE-CAPILLARY: 98 mg/dL (ref 65–99)
GLUCOSE-CAPILLARY: 99 mg/dL (ref 65–99)
GLUCOSE-CAPILLARY: 99 mg/dL (ref 65–99)
GLUCOSE-CAPILLARY: 99 mg/dL (ref 65–99)
Glucose-Capillary: 109 mg/dL — ABNORMAL HIGH (ref 65–99)
Glucose-Capillary: 127 mg/dL — ABNORMAL HIGH (ref 65–99)
Glucose-Capillary: 136 mg/dL — ABNORMAL HIGH (ref 65–99)

## 2017-05-14 LAB — BLOOD GAS, ARTERIAL
Acid-base deficit: 5.1 mmol/L — ABNORMAL HIGH (ref 0.0–2.0)
Bicarbonate: 19.5 mmol/L — ABNORMAL LOW (ref 20.0–28.0)
DRAWN BY: 236041
FIO2: 50
MECHVT: 600 mL
O2 SAT: 98.7 %
PATIENT TEMPERATURE: 98.6
PCO2 ART: 36.1 mmHg (ref 32.0–48.0)
PEEP: 5 cmH2O
PH ART: 7.353 (ref 7.350–7.450)
RATE: 20 resp/min
pO2, Arterial: 131 mmHg — ABNORMAL HIGH (ref 83.0–108.0)

## 2017-05-14 LAB — BASIC METABOLIC PANEL
Anion gap: 8 (ref 5–15)
BUN: 32 mg/dL — AB (ref 6–20)
CO2: 20 mmol/L — ABNORMAL LOW (ref 22–32)
CREATININE: 2.02 mg/dL — AB (ref 0.61–1.24)
Calcium: 8 mg/dL — ABNORMAL LOW (ref 8.9–10.3)
Chloride: 119 mmol/L — ABNORMAL HIGH (ref 101–111)
GFR calc Af Amer: 36 mL/min — ABNORMAL LOW (ref >60)
GFR, EST NON AFRICAN AMERICAN: 31 mL/min — AB (ref >60)
GLUCOSE: 113 mg/dL — AB (ref 65–99)
POTASSIUM: 4.4 mmol/L (ref 3.5–5.1)
SODIUM: 147 mmol/L — AB (ref 135–145)

## 2017-05-14 LAB — CBC
HCT: 22.7 % — ABNORMAL LOW (ref 39.0–52.0)
Hemoglobin: 6.9 g/dL — CL (ref 13.0–17.0)
MCH: 24.7 pg — AB (ref 26.0–34.0)
MCHC: 30.4 g/dL (ref 30.0–36.0)
MCV: 81.4 fL (ref 78.0–100.0)
PLATELETS: 273 10*3/uL (ref 150–400)
RBC: 2.79 MIL/uL — AB (ref 4.22–5.81)
RDW: 19.1 % — ABNORMAL HIGH (ref 11.5–15.5)
WBC: 16.1 10*3/uL — ABNORMAL HIGH (ref 4.0–10.5)

## 2017-05-14 LAB — PREPARE RBC (CROSSMATCH)

## 2017-05-14 MED ORDER — FENTANYL CITRATE (PF) 100 MCG/2ML IJ SOLN
50.0000 ug | INTRAMUSCULAR | Status: DC | PRN
  Administered 2017-05-14 – 2017-05-20 (×16): 100 ug via INTRAVENOUS
  Filled 2017-05-14 (×17): qty 2

## 2017-05-14 MED ORDER — PIVOT 1.5 CAL PO LIQD
1000.0000 mL | ORAL | Status: DC
  Administered 2017-05-14: 1000 mL

## 2017-05-14 NOTE — Progress Notes (Signed)
CRITICAL VALUE ALERT  Critical Value:  hgb 6.9   Date & Time Notied:  05/14/17 @ 0645  Provider Notified: yes, Dr. Kae Heller  Orders Received/Actions taken: no new orders, will continue to monitor

## 2017-05-14 NOTE — Progress Notes (Signed)
OT Cancellation Note  Patient Details Name: Malik [name] MRN: [id number] DOB: 1945-12-27   Cancelled Treatment:    Reason Eval/Treat Not Completed: Medical issues which prohibited therapy. Pt with medical decline and is now intubated. Will follow and continue OT when medically appropriate.   [place], OT/L  [phone] 05/14/2017 05/14/2017, 9:39 AM

## 2017-05-14 NOTE — Progress Notes (Signed)
Follow up - Trauma and Critical Care  Patient Details:    Malik [name] is an 72 y.o. male.  Lines/tubes : Airway 7.5 mm (Active)  Secured at (cm) 22 cm 05/14/2017  7:29 AM  Measured From Lips 05/14/2017  7:29 AM  Secured Location Right 05/14/2017  7:29 AM  Secured By [employer] 05/14/2017  7:29 AM  Tube Holder Repositioned Yes 05/14/2017  7:29 AM  Site Condition Dry 05/14/2017  7:29 AM     Chest Tube 1 Lateral;Left Mediastinal (Active)  Suction -20 cm H2O 05/13/2017  8:00 PM  Chest Tube Air Leak None 05/13/2017  8:00 PM  Drainage Description Serosanguineous 05/13/2017  8:00 PM  Dressing Status Clean;Dry;Intact 05/13/2017  8:00 PM  Site Assessment Dry;Intact 05/13/2017  8:00 PM  Surrounding Skin Dry;Intact 05/13/2017  8:00 PM  Output (mL) 150 mL 05/14/2017  6:00 AM     Closed System Drain 1 Right Abdomen Bulb (JP) 19 Fr. (Active)  Site Description Unable to view 05/13/2017  8:00 PM  Dressing Status Clean;Dry;Intact 05/13/2017  8:00 PM  Drainage Appearance Clear;Serosanguineous 05/13/2017  8:00 PM  Status To suction (Charged) 05/13/2017  8:00 PM  Intake (mL) 0 ml 05/11/2017  6:15 PM  Output (mL) 30 mL 05/14/2017  6:00 AM     NG/OG Tube Orogastric 16 Fr. Center mouth Xray (Active)  External Length of Tube (cm) - (if applicable) 65 cm 06/09/6604  7:44 AM  Site Assessment Clean;Dry;Intact 05/13/2017  8:00 PM  Ongoing Placement Verification No change in respiratory status;No acute changes, not attributed to clinical condition;Xray 05/13/2017  8:00 PM  Status Suction-low intermittent 05/13/2017  8:00 PM  Amount of suction 112 mmHg 05/13/2017  8:21 AM  Drainage Appearance Bloody;Tan 05/12/2017  9:00 PM  Output (mL) 300 mL 05/14/2017  6:00 AM     External Urinary Catheter (Active)  Collection Container Standard drainage bag 05/13/2017  8:00 PM  Output (mL) 75 mL 05/14/2017  6:00 AM    Microbiology/Sepsis markers: Results for orders placed or performed during the hospital encounter of 05/10/17  MRSA PCR Screening      Status: None   Collection Time: 05/11/17 12:09 AM  Result Value Ref Range Status   MRSA by PCR NEGATIVE NEGATIVE Final    Comment:        The GeneXpert MRSA Assay (FDA approved for NASAL specimens only), is one component of a comprehensive MRSA colonization surveillance program. It is not intended to diagnose MRSA infection nor to guide or monitor treatment for MRSA infections. Performed at [facility], [place] [address], [place], [place] [address]     Anti-infectives:  Anti-infectives (From admission, onward)   Start     Dose/Rate Route Frequency Ordered Stop   05/10/17 1330  ceFAZolin (ANCEF) IVPB 2g/100 mL premix    Comments:  To OR   2 g 200 mL/hr over 30 Minutes Intravenous  Once 05/10/17 1329 05/10/17 1405      Best Practice/Protocols:  VTE Prophylaxis: Lovenox (prophylaxtic dose) and Mechanical GI Prophylaxis: Proton Pump Inhibitor Continous Sedation Propofol only.  Consults:     Events:  Subjective:    Overnight Issues: No particular issues overnight.  Hemoglobin is low.  Objective:  Vital signs for last 24 hours: Temp:  [98.2 F (36.8 C)-98.7 F (37.1 C)] 98.6 F (37 C) (03/06 0800) Pulse Rate:  [59-86] 77 (03/06 0729) Resp:  [10-21] 10 (03/06 0729) BP: (110-176)/(51-104) 134/62 (03/06 0729) SpO2:  [98 %-100 %] 100 % (03/06 0729) FiO2 (%):  [  50 %-60 %] 50 % (03/06 0729)  Hemodynamic parameters for last 24 hours:    Intake/Output from previous day: 03/05 0701 - 03/06 0700 In: 3423.1 [I.V.:3423.1] Out: 2645 [Urine:1315; Emesis/NG output:500; Drains:30; Chest Tube:800]  Intake/Output this shift: No intake/output data recorded.  Vent settings for last 24 hours: Vent Mode: PRVC FiO2 (%):  [50 %-60 %] 50 % Set Rate:  [20 bmp] 20 bmp Vt Set:  [600 mL] 600 mL PEEP:  [5 cmH20] 5 cmH20 Plateau Pressure:  [15 ZOX09-60 cmH20] 17 cmH20  Physical Exam:  General: no respiratory distress and opens eyes but not following commands Neuro:  nonfocal exam, RASS 0, RASS -1 and agitated Resp: clear to auscultation bilaterally and CXR markedly improved.  No PTX.  Has a lot of serous outptu CVS: regular rate and rhythm, S1, S2 normal, no murmur, click, rub or gallop GI: soft, nontender, BS WNL, no r/g and Minimal NGT output Extremities: no edema, no erythema, pulses WNL  Results for orders placed or performed during the hospital encounter of 05/10/17 (from the past 24 hour(s))  Glucose, capillary     Status: Abnormal   Collection Time: 05/13/17 11:49 AM  Result Value Ref Range   Glucose-Capillary 123 (H) 65 - 99 mg/dL  Glucose, capillary     Status: Abnormal   Collection Time: 05/13/17 12:11 PM  Result Value Ref Range   Glucose-Capillary 134 (H) 65 - 99 mg/dL  Glucose, capillary     Status: Abnormal   Collection Time: 05/13/17  3:51 PM  Result Value Ref Range   Glucose-Capillary 118 (H) 65 - 99 mg/dL   Comment 1 Notify RN    Comment 2 Document in Chart   Glucose, capillary     Status: None   Collection Time: 05/13/17  7:47 PM  Result Value Ref Range   Glucose-Capillary 99 65 - 99 mg/dL  Glucose, capillary     Status: None   Collection Time: 05/13/17 11:34 PM  Result Value Ref Range   Glucose-Capillary 98 65 - 99 mg/dL  Glucose, capillary     Status: None   Collection Time: 05/14/17  3:04 AM  Result Value Ref Range   Glucose-Capillary 99 65 - 99 mg/dL  CBC     Status: Abnormal   Collection Time: 05/14/17  3:54 AM  Result Value Ref Range   WBC 16.1 (H) 4.0 - 10.5 K/uL   RBC 2.79 (L) 4.22 - 5.81 MIL/uL   Hemoglobin 6.9 (LL) 13.0 - 17.0 g/dL   HCT 22.7 (L) 39.0 - 52.0 %   MCV 81.4 78.0 - 100.0 fL   MCH 24.7 (L) 26.0 - 34.0 pg   MCHC 30.4 30.0 - 36.0 g/dL   RDW 19.1 (H) 11.5 - 15.5 %   Platelets 273 150 - 400 K/uL  Basic metabolic panel     Status: Abnormal   Collection Time: 05/14/17  3:54 AM  Result Value Ref Range   Sodium 147 (H) 135 - 145 mmol/L   Potassium 4.4 3.5 - 5.1 mmol/L   Chloride 119 (H) 101 - 111  mmol/L   CO2 20 (L) 22 - 32 mmol/L   Glucose, Bld 113 (H) 65 - 99 mg/dL   BUN 32 (H) 6 - 20 mg/dL   Creatinine, Ser 2.02 (H) 0.61 - 1.24 mg/dL   Calcium 8.0 (L) 8.9 - 10.3 mg/dL   GFR calc non Af Amer 31 (L) >60 mL/min   GFR calc Af Amer 36 (L) >60 mL/min   Anion  gap 8 5 - 15  Blood gas, arterial     Status: Abnormal   Collection Time: 05/14/17  4:10 AM  Result Value Ref Range   FIO2 50.00    Delivery systems VENTILATOR    Mode PRESSURE REGULATED VOLUME CONTROL    VT 600 mL   LHR 20 resp/min   Peep/cpap 5.0 cm H20   pH, Arterial 7.353 7.350 - 7.450   pCO2 arterial 36.1 32.0 - 48.0 mmHg   pO2, Arterial 131 (H) 83.0 - 108.0 mmHg   Bicarbonate 19.5 (L) 20.0 - 28.0 mmol/L   Acid-base deficit 5.1 (H) 0.0 - 2.0 mmol/L   O2 Saturation 98.7 %   Patient temperature 98.6    Collection site RIGHT BRACHIAL    Drawn by [id number]    Sample type ARTERIAL DRAW    Allens test (pass/fail) PASS PASS  Glucose, capillary     Status: Abnormal   Collection Time: 05/14/17  8:08 AM  Result Value Ref Range   Glucose-Capillary 109 (H) 65 - 99 mg/dL   Comment 1 Notify RN    Comment 2 Document in Chart      Assessment/Plan:   NEURO  Altered Mental Status:  agitation, delirium and sedation   Plan: Monitor neuro status and adjust sedation accordingly.  PULM  Atelectasis/collapse (focal and LLL with immproved effusion)   Plan: CPM.  Will start ventilator weaning and potential extubation in the near future, but probably not  Until the next 48 hours.  CARDIO  No specific issues   Plan: CPM  RENAL  Actue Renal Failure (due to hypovolemia/decreased circulating volume) Renal function is improving.  Urine output is good   Plan: CPM  GI  Splenic Trauma with splenectomy   Plan: Good bowel sounds.  Will start tube feedings at low rate  ID  No known infectious sources   Plan: CPM  HEME  Anemia acute blood loss anemia)   Plan: One unit of blood today.  ENDO No known issues   Plan: No changes   Global Issues  Patient with respiratory failure, partly because of DT's and also because of left effusion.  Doing okay currently and may try to wean.  Hemoglboin is low and will transfuse one unit of PRBC.  Recheck labs tomorrow.    LOS: 4 days   Additional comments:I reviewed the patient's new clinical lab test results. cbc/bmet/abg and I reviewed the patients new imaging test results. cxr  Critical Care Total Time*: 45 Minutes  Judeth Horn 05/14/2017  *Care during the described time interval was provided by me and/or other providers on the critical care team.  I have reviewed this patient's available data, including medical history, events of note, physical examination and test results as part of my evaluation.
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
## 2017-05-15 ENCOUNTER — Inpatient Hospital Stay (HOSPITAL_COMMUNITY): Payer: No Typology Code available for payment source

## 2017-05-15 LAB — CBC WITH DIFFERENTIAL/PLATELET
BASOS PCT: 0 %
Basophils Absolute: 0 10*3/uL (ref 0.0–0.1)
EOS ABS: 0.5 10*3/uL (ref 0.0–0.7)
EOS PCT: 3 %
HCT: 27.5 % — ABNORMAL LOW (ref 39.0–52.0)
Hemoglobin: 8.2 g/dL — ABNORMAL LOW (ref 13.0–17.0)
Lymphocytes Relative: 10 %
Lymphs Abs: 1.5 10*3/uL (ref 0.7–4.0)
MCH: 24.7 pg — AB (ref 26.0–34.0)
MCHC: 29.8 g/dL — ABNORMAL LOW (ref 30.0–36.0)
MCV: 82.8 fL (ref 78.0–100.0)
MONO ABS: 2 10*3/uL — AB (ref 0.1–1.0)
Monocytes Relative: 13 %
NEUTROS ABS: 11.2 10*3/uL — AB (ref 1.7–7.7)
NEUTROS PCT: 74 %
PLATELETS: 312 10*3/uL (ref 150–400)
RBC: 3.32 MIL/uL — ABNORMAL LOW (ref 4.22–5.81)
RDW: 18.8 % — AB (ref 11.5–15.5)
WBC: 15.2 10*3/uL — ABNORMAL HIGH (ref 4.0–10.5)

## 2017-05-15 LAB — BLOOD GAS, ARTERIAL
ACID-BASE DEFICIT: 2.1 mmol/L — AB (ref 0.0–2.0)
BICARBONATE: 22.3 mmol/L (ref 20.0–28.0)
Drawn by: 51191
FIO2: 40
LHR: 20 {breaths}/min
MECHVT: 600 mL
O2 SAT: 95.2 %
PATIENT TEMPERATURE: 98.6
PEEP/CPAP: 5 cmH2O
PO2 ART: 78.1 mmHg — AB (ref 83.0–108.0)
pCO2 arterial: 38.9 mmHg (ref 32.0–48.0)
pH, Arterial: 7.376 (ref 7.350–7.450)

## 2017-05-15 LAB — TYPE AND SCREEN
ABO/RH(D): O POS
Antibody Screen: NEGATIVE
UNIT DIVISION: 0

## 2017-05-15 LAB — BASIC METABOLIC PANEL
Anion gap: 5 (ref 5–15)
BUN: 23 mg/dL — ABNORMAL HIGH (ref 6–20)
CALCIUM: 8 mg/dL — AB (ref 8.9–10.3)
CHLORIDE: 121 mmol/L — AB (ref 101–111)
CO2: 22 mmol/L (ref 22–32)
Creatinine, Ser: 1.38 mg/dL — ABNORMAL HIGH (ref 0.61–1.24)
GFR calc Af Amer: 57 mL/min — ABNORMAL LOW (ref >60)
GFR calc non Af Amer: 50 mL/min — ABNORMAL LOW (ref >60)
Glucose, Bld: 165 mg/dL — ABNORMAL HIGH (ref 65–99)
Potassium: 4.3 mmol/L (ref 3.5–5.1)
SODIUM: 148 mmol/L — AB (ref 135–145)

## 2017-05-15 LAB — BPAM RBC
Blood Product Expiration Date: 201904012359
ISSUE DATE / TIME: 201903061148
Unit Type and Rh: 5100

## 2017-05-15 LAB — GLUCOSE, CAPILLARY
GLUCOSE-CAPILLARY: 106 mg/dL — AB (ref 65–99)
GLUCOSE-CAPILLARY: 159 mg/dL — AB (ref 65–99)
GLUCOSE-CAPILLARY: 150 mg/dL — AB (ref 65–99)
GLUCOSE-CAPILLARY: 166 mg/dL — AB (ref 65–99)
Glucose-Capillary: 151 mg/dL — ABNORMAL HIGH (ref 65–99)
Glucose-Capillary: 159 mg/dL — ABNORMAL HIGH (ref 65–99)

## 2017-05-15 MED ORDER — DEXMEDETOMIDINE HCL IN NACL 200 MCG/50ML IV SOLN
0.0000 ug/kg/h | INTRAVENOUS | Status: DC
  Administered 2017-05-15 (×2): 1 ug/kg/h via INTRAVENOUS
  Administered 2017-05-15: 0.5 ug/kg/h via INTRAVENOUS
  Administered 2017-05-15: 0.9 ug/kg/h via INTRAVENOUS
  Administered 2017-05-15: 1 ug/kg/h via INTRAVENOUS
  Administered 2017-05-15: 0.8 ug/kg/h via INTRAVENOUS
  Administered 2017-05-16 (×2): 0.7 ug/kg/h via INTRAVENOUS
  Administered 2017-05-16: 1 ug/kg/h via INTRAVENOUS
  Administered 2017-05-16: 0.8 ug/kg/h via INTRAVENOUS
  Administered 2017-05-16: 0.9 ug/kg/h via INTRAVENOUS
  Administered 2017-05-16: 0.8 ug/kg/h via INTRAVENOUS
  Administered 2017-05-16 (×2): 1 ug/kg/h via INTRAVENOUS
  Administered 2017-05-16: 0.9 ug/kg/h via INTRAVENOUS
  Filled 2017-05-15 (×3): qty 50
  Filled 2017-05-15 (×2): qty 100
  Filled 2017-05-15 (×5): qty 50
  Filled 2017-05-15 (×2): qty 100
  Filled 2017-05-15 (×3): qty 50

## 2017-05-15 MED ORDER — SODIUM CHLORIDE 0.45 % IV SOLN
INTRAVENOUS | Status: DC
  Administered 2017-05-15 – 2017-05-17 (×3): via INTRAVENOUS
  Filled 2017-05-15 (×6): qty 1000

## 2017-05-15 MED ORDER — PIVOT 1.5 CAL PO LIQD
1000.0000 mL | ORAL | Status: DC
Start: 2017-05-15 — End: 2017-05-17
  Administered 2017-05-15 (×3): 1000 mL

## 2017-05-15 NOTE — Progress Notes (Signed)
OT Cancellation Note  Patient Details Name: Malik [name] MRN: [id number] DOB: Oct 31, 1945   Cancelled Treatment:    Reason Eval/Treat Not Completed: Patient not medically ready (pt remains intubated and not yet appropriate)    [name] [phone] 05/15/2017, 7:58 AM

## 2017-05-15 NOTE — Progress Notes (Signed)
Follow up - Trauma and Critical Care  Patient Details:    Malik [name] is an 72 y.o. male.  Lines/tubes : Airway 7.5 mm (Active)  Secured at (cm) 25 cm 05/15/2017  3:43 AM  Measured From Lips 05/15/2017  3:43 AM  Secured Location Left 05/15/2017  3:43 AM  Secured By [employer] 05/15/2017  3:43 AM  Tube Holder Repositioned Yes 05/15/2017  3:43 AM  Cuff Pressure (cm H2O) 28 cm H2O 05/14/2017  8:15 PM  Site Condition Dry 05/15/2017  3:43 AM     Chest Tube 1 Lateral;Left Mediastinal (Active)  Suction -20 cm H2O 05/14/2017  8:00 PM  Chest Tube Air Leak None 05/14/2017  8:00 PM  Patency Intervention Tip/tilt 05/14/2017  8:00 PM  Drainage Description Serosanguineous 05/14/2017  8:00 PM  Dressing Status Clean;Dry;Intact 05/14/2017  8:00 PM  Site Assessment Dry;Intact 05/14/2017  8:00 PM  Surrounding Skin Unable to view 05/14/2017  8:00 AM  Output (mL) 170 mL 05/15/2017  7:00 AM     Closed System Drain 1 Right Abdomen Bulb (JP) 19 Fr. (Active)  Site Description Unable to view 05/14/2017  8:00 PM  Dressing Status Clean;Dry;Intact 05/14/2017  8:00 PM  Drainage Appearance Serosanguineous 05/14/2017  8:00 PM  Status To suction (Charged) 05/14/2017  8:00 PM  Intake (mL) 0 ml 05/11/2017  6:15 PM  Output (mL) 10 mL 05/15/2017  7:00 AM     NG/OG Tube Orogastric 16 Fr. Center mouth Xray (Active)  External Length of Tube (cm) - (if applicable) 65 cm 5/0/2774  7:44 AM  Site Assessment Clean;Dry;Intact 05/14/2017  8:00 PM  Ongoing Placement Verification No change in respiratory status;No acute changes, not attributed to clinical condition;No change in cm markings or external length of tube from initial placement 05/14/2017  8:00 PM  Status Infusing tube feed 05/14/2017  8:00 PM  Amount of suction 112 mmHg 05/14/2017  8:00 AM  Drainage Appearance Bloody;Tan 05/14/2017  8:00 AM  Output (mL) 300 mL 05/14/2017  6:00 AM     External Urinary Catheter (Active)  Collection Container Standard drainage bag 05/14/2017  8:00 PM  Securement  Method Securing device (Describe) 05/14/2017  8:00 PM  Output (mL) 300 mL 05/15/2017  5:00 AM    Microbiology/Sepsis markers: Results for orders placed or performed during the hospital encounter of 05/10/17  MRSA PCR Screening     Status: None   Collection Time: 05/11/17 12:09 AM  Result Value Ref Range Status   MRSA by PCR NEGATIVE NEGATIVE Final    Comment:        The GeneXpert MRSA Assay (FDA approved for NASAL specimens only), is one component of a comprehensive MRSA colonization surveillance program. It is not intended to diagnose MRSA infection nor to guide or monitor treatment for MRSA infections. Performed at [facility], [place] [address] [place], [place] [address]     Anti-infectives:  Anti-infectives (From admission, onward)   Start     Dose/Rate Route Frequency Ordered Stop   05/10/17 1330  ceFAZolin (ANCEF) IVPB 2g/100 mL premix    Comments:  To OR   2 g 200 mL/hr over 30 Minutes Intravenous  Once 05/10/17 1329 05/10/17 1405      Best Practice/Protocols:  VTE Prophylaxis: Mechanical GI Prophylaxis: Proton Pump Inhibitor Continous Sedation On Propofol but switching to Precedex  Consults:     Events:  Subjective:    Overnight Issues: No acute distress.    Objective:  Vital signs for last 24 hours: Temp:  [  98.4 F (36.9 C)-99 F (37.2 C)] 98.4 F (36.9 C) (03/07 0400) Pulse Rate:  [63-93] 76 (03/07 0700) Resp:  [13-22] 21 (03/07 0700) BP: (110-187)/(20-94) 176/76 (03/07 0700) SpO2:  [95 %-100 %] 100 % (03/07 0700) FiO2 (%):  [40 %-50 %] 40 % (03/07 0400)  Hemodynamic parameters for last 24 hours:    Intake/Output from previous day: 03/06 0701 - 03/07 0700 In: 2653.8 [I.V.:1899.8; Blood:384; NG/GT:370] Out: 5176 [Urine:1155; Drains:25; Chest Tube:350]  Intake/Output this shift: No intake/output data recorded.  Vent settings for last 24 hours: Vent Mode: PRVC FiO2 (%):  [40 %-50 %] 40 % Set Rate:  [20 bmp] 20 bmp Vt Set:  [600 mL]  600 mL PEEP:  [5 cmH20] 5 cmH20 Pressure Support:  [10 cmH20] 10 cmH20 Plateau Pressure:  [12 HYW73-71 cmH20] 12 cmH20  Physical Exam:  General: no respiratory distress Neuro: nonfocal exam, RASS -2 and bewing well sedated Resp: Squeaking breath sounds on the right in the upper lobe.  Ventilating and oxygenating well. CVS: regular rate and rhythm, S1, S2 normal, no murmur, click, rub or gallop GI: soft, nontender, BS WNL, no r/g and Has been tolerating tube feedings at 20 Extremities: no edema, no erythema, pulses WNL  Results for orders placed or performed during the hospital encounter of 05/10/17 (from the past 24 hour(s))  Glucose, capillary     Status: Abnormal   Collection Time: 05/14/17  8:08 AM  Result Value Ref Range   Glucose-Capillary 109 (H) 65 - 99 mg/dL   Comment 1 Notify RN    Comment 2 Document in Chart   Prepare RBC     Status: None   Collection Time: 05/14/17  9:46 AM  Result Value Ref Range   Order Confirmation      ORDER PROCESSED BY BLOOD BANK Performed at [facility], [address] [place], [place] [address]   Type and screen [place]     Status: None   Collection Time: 05/14/17 10:00 AM  Result Value Ref Range   ABO/RH(D) O POS    Antibody Screen NEG    Sample Expiration 05/17/2017    Unit Number [id number]    Blood Component Type RED CELLS,LR    Unit division 00    Status of Unit ISSUED,FINAL    Transfusion Status OK TO TRANSFUSE    Crossmatch Result      Compatible Performed at [facility], [place] [address] [place], [place] [address]   Glucose, capillary     Status: Abnormal   Collection Time: 05/14/17 12:23 PM  Result Value Ref Range   Glucose-Capillary 125 (H) 65 - 99 mg/dL   Comment 1 Notify RN    Comment 2 Document in Chart   Glucose, capillary     Status: None   Collection Time: 05/14/17  3:52 PM  Result Value Ref Range   Glucose-Capillary 99 65 - 99 mg/dL   Comment 1 Notify RN    Comment 2  Document in Chart   Glucose, capillary     Status: Abnormal   Collection Time: 05/14/17  7:25 PM  Result Value Ref Range   Glucose-Capillary 127 (H) 65 - 99 mg/dL  Glucose, capillary     Status: Abnormal   Collection Time: 05/14/17 11:32 PM  Result Value Ref Range   Glucose-Capillary 136 (H) 65 - 99 mg/dL  Glucose, capillary     Status: Abnormal   Collection Time: 05/15/17  3:27 AM  Result Value  Ref Range   Glucose-Capillary 151 (H) 65 - 99 mg/dL  CBC with Differential/Platelet     Status: Abnormal   Collection Time: 05/15/17  3:50 AM  Result Value Ref Range   WBC 15.2 (H) 4.0 - 10.5 K/uL   RBC 3.32 (L) 4.22 - 5.81 MIL/uL   Hemoglobin 8.2 (L) 13.0 - 17.0 g/dL   HCT 27.5 (L) 39.0 - 52.0 %   MCV 82.8 78.0 - 100.0 fL   MCH 24.7 (L) 26.0 - 34.0 pg   MCHC 29.8 (L) 30.0 - 36.0 g/dL   RDW 18.8 (H) 11.5 - 15.5 %   Platelets 312 150 - 400 K/uL   Neutrophils Relative % 74 %   Lymphocytes Relative 10 %   Monocytes Relative 13 %   Eosinophils Relative 3 %   Basophils Relative 0 %   Neutro Abs 11.2 (H) 1.7 - 7.7 K/uL   Lymphs Abs 1.5 0.7 - 4.0 K/uL   Monocytes Absolute 2.0 (H) 0.1 - 1.0 K/uL   Eosinophils Absolute 0.5 0.0 - 0.7 K/uL   Basophils Absolute 0.0 0.0 - 0.1 K/uL   RBC Morphology RARE NRBCs   Basic metabolic panel     Status: Abnormal   Collection Time: 05/15/17  3:50 AM  Result Value Ref Range   Sodium 148 (H) 135 - 145 mmol/L   Potassium 4.3 3.5 - 5.1 mmol/L   Chloride 121 (H) 101 - 111 mmol/L   CO2 22 22 - 32 mmol/L   Glucose, Bld 165 (H) 65 - 99 mg/dL   BUN 23 (H) 6 - 20 mg/dL   Creatinine, Ser 1.38 (H) 0.61 - 1.24 mg/dL   Calcium 8.0 (L) 8.9 - 10.3 mg/dL   GFR calc non Af Amer 50 (L) >60 mL/min   GFR calc Af Amer 57 (L) >60 mL/min   Anion gap 5 5 - 15  Blood gas, arterial     Status: Abnormal   Collection Time: 05/15/17  3:55 AM  Result Value Ref Range   FIO2 40.00    Delivery systems VENTILATOR    Mode PRESSURE REGULATED VOLUME CONTROL    VT 600 mL   LHR 20  resp/min   Peep/cpap 5.0 cm H20   pH, Arterial 7.376 7.350 - 7.450   pCO2 arterial 38.9 32.0 - 48.0 mmHg   pO2, Arterial 78.1 (L) 83.0 - 108.0 mmHg   Bicarbonate 22.3 20.0 - 28.0 mmol/L   Acid-base deficit 2.1 (H) 0.0 - 2.0 mmol/L   O2 Saturation 95.2 %   Patient temperature 98.6    Collection site RIGHT RADIAL    Drawn by [id number]    Sample type ARTERIAL DRAW    Allens test (pass/fail) PASS PASS     Assessment/Plan:   NEURO  Altered Mental Status:  sedation   Plan: Will switch to Precedex to be able to continue afterwards  PULM  Atelectasis/collapse (focal and RLL and LLL)  Still has a lot of drainage from chest tube.  All serous   Plan: Will continue to try to wean with Precedex today.  CARDIO  No cardiac issues   Plan: CPM  RENAL  Hypernatremia moderate (146 - 155 meq/dl) Urine output is good and renal function is improving   Plan: CPM  GI  Splenic Trauma with splenectomy  Blake drain with minimal output   Plan: Increase tube feedings.  ID  No known infectious sources, but will be checking sputum culture today.   Plan: Tracheal aspirate culture  HEME  Anemia acute blood loss anemia and anemia of critical illness)   Plan: Got one unit of blood yesterday and hemoglobin increased from 6.9 to 8.2  ENDO Mild hyperglycemia   Plan: CPM  Global Issues  Patient doing well, we can probably start weaning with Precedex.  CT cannot be removed yet, but we can place on waterseal.  No blood.  Recheck labs tomorrow.    LOS: 5 days   Additional comments:I reviewed the patient's new clinical lab test results. cbc/bmet/abg and I reviewed the patients new imaging test results. CXR  Critical Care Total Time*: 30 Minutes  Judeth Horn 05/15/2017  *Care during the described time interval was provided by me and/or other providers on the critical care team.  I have reviewed this patient's available data, including medical history, events of note, physical examination and test results as part  of my evaluation.

## 2017-05-15 NOTE — Progress Notes (Signed)
PT Cancellation Note  Patient Details Name: Malik [name] MRN: [id number] DOB: [birth year]   Cancelled Treatment:    Reason Eval/Treat Not Completed: Medical issues which prohibited therapy(pt remains intubated and not yet appropriate)   Caci Orren B Charissa Knowles 05/15/2017, 7:43 AM  Elwyn Reach, [place]

## 2017-05-16 ENCOUNTER — Inpatient Hospital Stay (HOSPITAL_COMMUNITY): Payer: No Typology Code available for payment source

## 2017-05-16 LAB — BASIC METABOLIC PANEL
ANION GAP: 7 (ref 5–15)
BUN: 18 mg/dL (ref 6–20)
CHLORIDE: 122 mmol/L — AB (ref 101–111)
CO2: 22 mmol/L (ref 22–32)
Calcium: 8.1 mg/dL — ABNORMAL LOW (ref 8.9–10.3)
Creatinine, Ser: 1.19 mg/dL (ref 0.61–1.24)
GFR calc Af Amer: >60 mL/min (ref >60)
GFR, EST NON AFRICAN AMERICAN: 59 mL/min — AB (ref >60)
GLUCOSE: 175 mg/dL — AB (ref 65–99)
Potassium: 4.5 mmol/L (ref 3.5–5.1)
Sodium: 151 mmol/L — ABNORMAL HIGH (ref 135–145)

## 2017-05-16 LAB — GLUCOSE, CAPILLARY
GLUCOSE-CAPILLARY: 160 mg/dL — AB (ref 65–99)
GLUCOSE-CAPILLARY: 127 mg/dL — AB (ref 65–99)
Glucose-Capillary: 150 mg/dL — ABNORMAL HIGH (ref 65–99)
Glucose-Capillary: 115 mg/dL — ABNORMAL HIGH (ref 65–99)
Glucose-Capillary: 102 mg/dL — ABNORMAL HIGH (ref 65–99)
Glucose-Capillary: 135 mg/dL — ABNORMAL HIGH (ref 65–99)

## 2017-05-16 LAB — CBC WITH DIFFERENTIAL/PLATELET
BASOS PCT: 0 %
Basophils Absolute: 0 10*3/uL (ref 0.0–0.1)
EOS PCT: 4 %
Eosinophils Absolute: 0.5 10*3/uL (ref 0.0–0.7)
HCT: 28.3 % — ABNORMAL LOW (ref 39.0–52.0)
HEMOGLOBIN: 8.5 g/dL — AB (ref 13.0–17.0)
LYMPHS PCT: 13 %
Lymphs Abs: 1.7 10*3/uL (ref 0.7–4.0)
MCH: 25.1 pg — AB (ref 26.0–34.0)
MCHC: 30 g/dL (ref 30.0–36.0)
MCV: 83.5 fL (ref 78.0–100.0)
MONOS PCT: 12 %
Monocytes Absolute: 1.6 10*3/uL — ABNORMAL HIGH (ref 0.1–1.0)
NEUTROS PCT: 71 %
Neutro Abs: 9.5 10*3/uL — ABNORMAL HIGH (ref 1.7–7.7)
Platelets: 342 10*3/uL (ref 150–400)
RBC: 3.39 MIL/uL — AB (ref 4.22–5.81)
RDW: 19.4 % — ABNORMAL HIGH (ref 11.5–15.5)
WBC: 13.3 10*3/uL — AB (ref 4.0–10.5)

## 2017-05-16 LAB — POCT I-STAT 3, ART BLOOD GAS (G3+)
ACID-BASE DEFICIT: 2 mmol/L (ref 0.0–2.0)
BICARBONATE: 22.3 mmol/L (ref 20.0–28.0)
O2 Saturation: 98 %
Patient temperature: 98.3
TCO2: 23 mmol/L (ref 22–32)
pCO2 arterial: 36.2 mmHg (ref 32.0–48.0)
pH, Arterial: 7.396 (ref 7.350–7.450)
pO2, Arterial: 110 mmHg — ABNORMAL HIGH (ref 83.0–108.0)

## 2017-05-16 LAB — TRIGLYCERIDES: TRIGLYCERIDES: 97 mg/dL (ref <150)

## 2017-05-16 MED ORDER — QUETIAPINE FUMARATE 25 MG PO TABS
50.0000 mg | ORAL_TABLET | Freq: Three times a day (TID) | ORAL | Status: DC
  Administered 2017-05-16: 50 mg
  Filled 2017-05-16: qty 2

## 2017-05-16 MED ORDER — CLONAZEPAM 1 MG PO TABS
1.0000 mg | ORAL_TABLET | Freq: Two times a day (BID) | ORAL | Status: DC
  Administered 2017-05-16: 1 mg
  Filled 2017-05-16: qty 1

## 2017-05-16 MED ORDER — INSULIN GLARGINE 100 UNIT/ML ~~LOC~~ SOLN
10.0000 [IU] | Freq: Every day | SUBCUTANEOUS | Status: DC
  Administered 2017-05-16 – 2017-05-25 (×9): 10 [IU] via SUBCUTANEOUS
  Filled 2017-05-16 (×12): qty 0.1

## 2017-05-16 MED ORDER — ORAL CARE MOUTH RINSE
15.0000 mL | OROMUCOSAL | Status: DC
  Administered 2017-05-16 – 2017-05-22 (×28): 15 mL via OROMUCOSAL

## 2017-05-16 MED ORDER — CLONAZEPAM 0.1 MG/ML ORAL SUSPENSION
1.0000 mg | Freq: Two times a day (BID) | ORAL | Status: DC
  Filled 2017-05-16: qty 10

## 2017-05-16 MED ORDER — HYDRALAZINE HCL 20 MG/ML IJ SOLN
10.0000 mg | INTRAMUSCULAR | Status: AC | PRN
  Administered 2017-05-16 – 2017-05-18 (×4): 10 mg via INTRAVENOUS
  Filled 2017-05-16 (×4): qty 1

## 2017-05-16 NOTE — Progress Notes (Signed)
OT Cancellation Note  Patient Details Name: Malik [name] MRN: [id number] DOB: 28-Apr-1945   Cancelled Treatment:    Reason Eval/Treat Not Completed: Patient not medically ready(Remains intubated. Will assess when medically appropriate. )  [place], OT/L  [phone] 05/16/2017 05/16/2017, 9:16 AM

## 2017-05-16 NOTE — Progress Notes (Signed)
Follow up - Trauma and Critical Care  Patient Details:    Malik [name] is an 72 y.o. male.  Lines/tubes : Airway 7.5 mm (Active)  Secured at (cm) 25 cm 05/16/2017  3:11 AM  Measured From Lips 05/16/2017  3:11 AM  Secured Location Center 05/16/2017  3:11 AM  Secured By [employer] 05/16/2017  3:11 AM  Tube Holder Repositioned Yes 05/16/2017  3:11 AM  Cuff Pressure (cm H2O) 26 cm H2O 05/16/2017  3:11 AM  Site Condition Dry 05/16/2017  3:11 AM     Chest Tube 1 Lateral;Left Mediastinal (Active)  Suction To water seal 05/15/2017  8:00 PM  Chest Tube Air Leak None 05/15/2017  8:00 PM  Patency Intervention Tip/tilt 05/15/2017  8:00 AM  Drainage Description Serosanguineous 05/15/2017  8:00 PM  Dressing Status Clean;Dry;Intact 05/15/2017  8:00 PM  Site Assessment Dry;Intact 05/15/2017  8:00 PM  Surrounding Skin Unable to view 05/15/2017  8:00 PM  Output (mL) 70 mL 05/16/2017  6:00 AM     Closed System Drain 1 Right Abdomen Bulb (JP) 19 Fr. (Active)  Site Description Unable to view 05/15/2017  8:00 PM  Dressing Status Clean;Dry;Intact 05/15/2017  8:00 PM  Drainage Appearance Serosanguineous 05/15/2017  8:00 PM  Status To suction (Charged) 05/15/2017  8:00 PM  Intake (mL) 0 ml 05/11/2017  6:15 PM  Output (mL) 10 mL 05/16/2017  6:00 AM     NG/OG Tube Orogastric 16 Fr. Center mouth Xray (Active)  External Length of Tube (cm) - (if applicable) 65 cm 04/19/9369  7:44 AM  Site Assessment Clean;Dry;Intact 05/15/2017  8:00 PM  Ongoing Placement Verification No change in respiratory status;No acute changes, not attributed to clinical condition;No change in cm markings or external length of tube from initial placement 05/15/2017  8:00 PM  Status Infusing tube feed 05/15/2017  8:00 PM  Amount of suction 112 mmHg 05/14/2017  8:00 AM  Drainage Appearance Bloody;Tan 05/14/2017  8:00 AM  Output (mL) 300 mL 05/14/2017  6:00 AM     External Urinary Catheter (Active)  Collection Container Standard drainage bag 05/15/2017  8:00 PM   Securement Method Securing device (Describe) 05/15/2017  8:00 PM  Output (mL) 75 mL 05/16/2017  6:00 AM    Microbiology/Sepsis markers: Results for orders placed or performed during the hospital encounter of 05/10/17  MRSA PCR Screening     Status: None   Collection Time: 05/11/17 12:09 AM  Result Value Ref Range Status   MRSA by PCR NEGATIVE NEGATIVE Final    Comment:        The GeneXpert MRSA Assay (FDA approved for NASAL specimens only), is one component of a comprehensive MRSA colonization surveillance program. It is not intended to diagnose MRSA infection nor to guide or monitor treatment for MRSA infections. Performed at [facility], [place] [address], [place], [place] [address]   Culture, respiratory (NON-Expectorated)     Status: None (Preliminary result)   Collection Time: 05/15/17  9:29 AM  Result Value Ref Range Status   Specimen Description TRACHEAL ASPIRATE  Final   Special Requests Normal  Final   Gram Stain   Final    FEW WBC PRESENT, PREDOMINANTLY PMN NO ORGANISMS SEEN Performed at [facility], [place] [address], [place], [place] [address]    Culture PENDING  Incomplete   Report Status PENDING  Incomplete    Anti-infectives:  Anti-infectives (From admission, onward)   Start     Dose/Rate Route Frequency Ordered Stop  05/10/17 1330  ceFAZolin (ANCEF) IVPB 2g/100 mL premix    Comments:  To OR   2 g 200 mL/hr over 30 Minutes Intravenous  Once 05/10/17 1329 05/10/17 1405      Best Practice/Protocols:  VTE Prophylaxis: Mechanical GI Prophylaxis: Proton Pump Inhibitor Continous Sedation Precedex  Consults:     Events:  Subjective:    Overnight Issues: No issues overnight  Objective:  Vital signs for last 24 hours: Temp:  [98 F (36.7 C)-99.8 F (37.7 C)] 98.8 F (37.1 C) (03/08 0400) Pulse Rate:  [37-114] 53 (03/08 0700) Resp:  [17-26] 21 (03/08 0700) BP: (130-239)/(50-134) 139/57 (03/08 0700) SpO2:  [76 %-100 %] 100 % (03/08  0700) FiO2 (%):  [40 %] 40 % (03/08 0400)  Hemodynamic parameters for last 24 hours:    Intake/Output from previous day: 03/07 0701 - 03/08 0700 In: 2456.4 [I.V.:1526.4; NG/GT:930] Out: 2030 [Urine:1725; Drains:35; Chest Tube:270]  Intake/Output this shift: No intake/output data recorded.  Vent settings for last 24 hours: Vent Mode: PRVC FiO2 (%):  [40 %] 40 % Set Rate:  [20 bmp] 20 bmp Vt Set:  [600 mL] 600 mL PEEP:  [5 cmH20] 5 cmH20 Pressure Support:  [5 cmH20] 5 cmH20 Plateau Pressure:  [14 cmH20-15 cmH20] 15 cmH20  Physical Exam:  General: alert and no respiratory distress Neuro: alert, nonfocal exam, RASS 0, RASS -1 and followign some commands HEENT/Neck: ETT WNL  Resp: clear to auscultation bilaterally and No rhonchi this am.  CXR shows improving basilar atleectasis CVS: regular rate and rhythm, S1, S2 normal, no murmur, click, rub or gallop GI: soft, nontender, BS WNL, no r/g and tolerating tube feedings well. Extremities: no edema, no erythema, pulses WNL  Results for orders placed or performed during the hospital encounter of 05/10/17 (from the past 24 hour(s))  Glucose, capillary     Status: Abnormal   Collection Time: 05/15/17  7:59 AM  Result Value Ref Range   Glucose-Capillary 106 (H) 65 - 99 mg/dL  Culture, respiratory (NON-Expectorated)     Status: None (Preliminary result)   Collection Time: 05/15/17  9:29 AM  Result Value Ref Range   Specimen Description TRACHEAL ASPIRATE    Special Requests Normal    Gram Stain      FEW WBC PRESENT, PREDOMINANTLY PMN NO ORGANISMS SEEN Performed at [place] [facility] [address] [place], [place] [address]    Culture PENDING    Report Status PENDING   Glucose, capillary     Status: Abnormal   Collection Time: 05/15/17 11:52 AM  Result Value Ref Range   Glucose-Capillary 159 (H) 65 - 99 mg/dL  Glucose, capillary     Status: Abnormal   Collection Time: 05/15/17  4:03 PM  Result Value Ref Range    Glucose-Capillary 150 (H) 65 - 99 mg/dL  Glucose, capillary     Status: Abnormal   Collection Time: 05/15/17  8:17 PM  Result Value Ref Range   Glucose-Capillary 166 (H) 65 - 99 mg/dL  Glucose, capillary     Status: Abnormal   Collection Time: 05/15/17 11:45 PM  Result Value Ref Range   Glucose-Capillary 159 (H) 65 - 99 mg/dL  CBC with Differential/Platelet     Status: Abnormal   Collection Time: 05/16/17  2:23 AM  Result Value Ref Range   WBC 13.3 (H) 4.0 - 10.5 K/uL   RBC 3.39 (L) 4.22 - 5.81 MIL/uL   Hemoglobin 8.5 (L) 13.0 - 17.0 g/dL   HCT 28.3 (L)  39.0 - 52.0 %   MCV 83.5 78.0 - 100.0 fL   MCH 25.1 (L) 26.0 - 34.0 pg   MCHC 30.0 30.0 - 36.0 g/dL   RDW 19.4 (H) 11.5 - 15.5 %   Platelets 342 150 - 400 K/uL   Neutrophils Relative % 71 %   Lymphocytes Relative 13 %   Monocytes Relative 12 %   Eosinophils Relative 4 %   Basophils Relative 0 %   Neutro Abs 9.5 (H) 1.7 - 7.7 K/uL   Lymphs Abs 1.7 0.7 - 4.0 K/uL   Monocytes Absolute 1.6 (H) 0.1 - 1.0 K/uL   Eosinophils Absolute 0.5 0.0 - 0.7 K/uL   Basophils Absolute 0.0 0.0 - 0.1 K/uL   RBC Morphology POLYCHROMASIA PRESENT   Basic metabolic panel     Status: Abnormal   Collection Time: 05/16/17  2:23 AM  Result Value Ref Range   Sodium 151 (H) 135 - 145 mmol/L   Potassium 4.5 3.5 - 5.1 mmol/L   Chloride 122 (H) 101 - 111 mmol/L   CO2 22 22 - 32 mmol/L   Glucose, Bld 175 (H) 65 - 99 mg/dL   BUN 18 6 - 20 mg/dL   Creatinine, Ser 1.19 0.61 - 1.24 mg/dL   Calcium 8.1 (L) 8.9 - 10.3 mg/dL   GFR calc non Af Amer 59 (L) >60 mL/min   GFR calc Af Amer >60 >60 mL/min   Anion gap 7 5 - 15  Triglycerides     Status: None   Collection Time: 05/16/17  2:23 AM  Result Value Ref Range   Triglycerides 97 <150 mg/dL  Glucose, capillary     Status: Abnormal   Collection Time: 05/16/17  4:37 AM  Result Value Ref Range   Glucose-Capillary 150 (H) 65 - 99 mg/dL   Comment 1 Notify RN    Comment 2 Document in Chart   Glucose, capillary      Status: Abnormal   Collection Time: 05/16/17  7:26 AM  Result Value Ref Range   Glucose-Capillary 160 (H) 65 - 99 mg/dL     Assessment/Plan:   NEURO  Altered Mental Status:  agitation, delirium and sedation   Plan: Still on sedation.  Planning to add som seroquel and klonopin  PULM  Atelectasis/collapse (focal and bibasilar but improved)   Plan: Possible extubation today.  CARDIO  No cardiac issues   Plan: CPM  RENAL  Hypernatremia moderate (146 - 155 meq/dl) and getting free water and on 1/2 NS infusion Recheck labs tomorrow.   Plan: Recheck labs in AM  GI  Splenic Trauma with splenectomy tolerating tube feedings well   Plan: Will remove the OGT with extubation and possibly have to pass [place] sump or Cortrak  ID  No known infectious sources.  Tracheal aspirate sent yesterday in pending.   Plan: CPM  HEME  Anemia acute blood loss anemia and anemia of critical illness)   Plan: No transfusion for now.  ENDO Hyperglycemia (stress related and suspected)   Plan: Added Lantus to regimen  Global Issues  Try to extubate today.  Secretions are better, mental status is better, but the patient does not have a great cough mechanism    LOS: 6 days   Additional comments:I reviewed the patient's new clinical lab test results. cbc/bmet and I reviewed the patients new imaging test results. cxr  Critical Care Total Time*: 30 Minutes  Judeth Horn 05/16/2017  *Care during the described time interval was provided by me and/or  other providers on the critical care team.  I have reviewed this patient's available data, including medical history, events of note, physical examination and test results as part of my evaluation.

## 2017-05-16 NOTE — Progress Notes (Signed)
PT Cancellation Note  Patient Details Name: Malik [name] MRN: [id number] DOB: 10-06-1945   Cancelled Treatment:    Reason Eval/Treat Not Completed: Medical issues which prohibited therapy(pt remains intubated will hold and check on 3/11)   Yomira Flitton B Donatello Kleve 05/16/2017, 7:02 AM  Elwyn Reach, [place]

## 2017-05-16 NOTE — Procedures (Signed)
Extubation Procedure Note  Patient Details:   Name: Malik [name] DOB: [birth year] MRN: [id number]   Airway Documentation:     Evaluation  O2 sats: stable throughout Complications: No apparent complications Patient did tolerate procedure well. Bilateral Breath Sounds: Rhonchi   No   Small positive cuff leak noted.  Pt able to reach -30 using NIF.  Pt placed on [place] 4 L with humidity. No stridor noted.  Pt not speaking but will wiggle his toes when asked.  Pt not able to use incentive spirometer at this time.  Malik [name] 05/16/2017, 11:14 AM

## 2017-05-17 ENCOUNTER — Inpatient Hospital Stay (HOSPITAL_COMMUNITY): Payer: No Typology Code available for payment source

## 2017-05-17 LAB — CBC WITH DIFFERENTIAL/PLATELET
Basophils Absolute: 0 10*3/uL (ref 0.0–0.1)
Basophils Relative: 0 %
EOS ABS: 0.2 10*3/uL (ref 0.0–0.7)
EOS PCT: 1 %
HEMATOCRIT: 28.9 % — AB (ref 39.0–52.0)
HEMOGLOBIN: 8.6 g/dL — AB (ref 13.0–17.0)
LYMPHS PCT: 12 %
Lymphs Abs: 2 10*3/uL (ref 0.7–4.0)
MCH: 24.9 pg — AB (ref 26.0–34.0)
MCHC: 29.8 g/dL — AB (ref 30.0–36.0)
MCV: 83.8 fL (ref 78.0–100.0)
Monocytes Absolute: 1.3 10*3/uL — ABNORMAL HIGH (ref 0.1–1.0)
Monocytes Relative: 8 %
NEUTROS ABS: 13.3 10*3/uL — AB (ref 1.7–7.7)
Neutrophils Relative %: 79 %
Platelets: 422 10*3/uL — ABNORMAL HIGH (ref 150–400)
RBC: 3.45 MIL/uL — ABNORMAL LOW (ref 4.22–5.81)
RDW: 20.1 % — ABNORMAL HIGH (ref 11.5–15.5)
WBC: 16.8 10*3/uL — ABNORMAL HIGH (ref 4.0–10.5)

## 2017-05-17 LAB — GLUCOSE, CAPILLARY
GLUCOSE-CAPILLARY: 107 mg/dL — AB (ref 65–99)
GLUCOSE-CAPILLARY: 108 mg/dL — AB (ref 65–99)
GLUCOSE-CAPILLARY: 95 mg/dL (ref 65–99)
GLUCOSE-CAPILLARY: 95 mg/dL (ref 65–99)
Glucose-Capillary: 159 mg/dL — ABNORMAL HIGH (ref 65–99)
Glucose-Capillary: 170 mg/dL — ABNORMAL HIGH (ref 65–99)

## 2017-05-17 LAB — CULTURE, RESPIRATORY W GRAM STAIN: Special Requests: NORMAL

## 2017-05-17 LAB — BASIC METABOLIC PANEL
Anion gap: 11 (ref 5–15)
BUN: 15 mg/dL (ref 6–20)
CHLORIDE: 118 mmol/L — AB (ref 101–111)
CO2: 24 mmol/L (ref 22–32)
CREATININE: 1.19 mg/dL (ref 0.61–1.24)
Calcium: 8.3 mg/dL — ABNORMAL LOW (ref 8.9–10.3)
GFR calc Af Amer: >60 mL/min (ref >60)
GFR calc non Af Amer: 59 mL/min — ABNORMAL LOW (ref >60)
Glucose, Bld: 123 mg/dL — ABNORMAL HIGH (ref 65–99)
Potassium: 3.8 mmol/L (ref 3.5–5.1)
SODIUM: 153 mmol/L — AB (ref 135–145)

## 2017-05-17 LAB — CULTURE, RESPIRATORY: CULTURE: NORMAL

## 2017-05-17 MED ORDER — PIVOT 1.5 CAL PO LIQD
1000.0000 mL | ORAL | Status: DC
  Administered 2017-05-17: 1000 mL

## 2017-05-17 MED ORDER — WHITE PETROLATUM EX OINT
TOPICAL_OINTMENT | CUTANEOUS | Status: AC
  Administered 2017-05-17: 0.2
  Filled 2017-05-17: qty 28.35

## 2017-05-17 MED ORDER — LABETALOL HCL 5 MG/ML IV SOLN
10.0000 mg | Freq: Four times a day (QID) | INTRAVENOUS | Status: DC | PRN
  Administered 2017-05-17: 10 mg via INTRAVENOUS
  Filled 2017-05-17: qty 4

## 2017-05-17 MED ORDER — LABETALOL HCL 5 MG/ML IV SOLN
10.0000 mg | INTRAVENOUS | Status: DC | PRN
  Administered 2017-05-17 – 2017-05-25 (×10): 10 mg via INTRAVENOUS
  Filled 2017-05-17 (×11): qty 4

## 2017-05-17 MED ORDER — LACTATED RINGERS IV SOLN
INTRAVENOUS | Status: DC
  Administered 2017-05-17 – 2017-05-18 (×2): via INTRAVENOUS

## 2017-05-17 NOTE — Progress Notes (Signed)
Follow up - Trauma and Critical Care  Patient Details:    Malik [name] is an 72 y.o. male.  Lines/tubes : Airway 7.5 mm (Active)  Secured at (cm) 25 cm 05/16/2017  3:11 AM  Measured From Lips 05/16/2017  3:11 AM  Secured Location Center 05/16/2017  3:11 AM  Secured By [employer] 05/16/2017  3:11 AM  Tube Holder Repositioned Yes 05/16/2017  3:11 AM  Cuff Pressure (cm H2O) 26 cm H2O 05/16/2017  3:11 AM  Site Condition Dry 05/16/2017  3:11 AM     Chest Tube 1 Lateral;Left Mediastinal (Active)  Suction To water seal 05/15/2017  8:00 PM  Chest Tube Air Leak None 05/15/2017  8:00 PM  Patency Intervention Tip/tilt 05/15/2017  8:00 AM  Drainage Description Serosanguineous 05/15/2017  8:00 PM  Dressing Status Clean;Dry;Intact 05/15/2017  8:00 PM  Site Assessment Dry;Intact 05/15/2017  8:00 PM  Surrounding Skin Unable to view 05/15/2017  8:00 PM  Output (mL) 70 mL 05/16/2017  6:00 AM     Closed System Drain 1 Right Abdomen Bulb (JP) 19 Fr. (Active)  Site Description Unable to view 05/15/2017  8:00 PM  Dressing Status Clean;Dry;Intact 05/15/2017  8:00 PM  Drainage Appearance Serosanguineous 05/15/2017  8:00 PM  Status To suction (Charged) 05/15/2017  8:00 PM  Intake (mL) 0 ml 05/11/2017  6:15 PM  Output (mL) 10 mL 05/16/2017  6:00 AM     NG/OG Tube Orogastric 16 Fr. Center mouth Xray (Active)  External Length of Tube (cm) - (if applicable) 65 cm 08/16/3417  7:44 AM  Site Assessment Clean;Dry;Intact 05/15/2017  8:00 PM  Ongoing Placement Verification No change in respiratory status;No acute changes, not attributed to clinical condition;No change in cm markings or external length of tube from initial placement 05/15/2017  8:00 PM  Status Infusing tube feed 05/15/2017  8:00 PM  Amount of suction 112 mmHg 05/14/2017  8:00 AM  Drainage Appearance Bloody;Tan 05/14/2017  8:00 AM  Output (mL) 300 mL 05/14/2017  6:00 AM     External Urinary Catheter (Active)  Collection Container Standard drainage bag 05/15/2017  8:00 PM   Securement Method Securing device (Describe) 05/15/2017  8:00 PM  Output (mL) 75 mL 05/16/2017  6:00 AM    Microbiology/Sepsis markers: Results for orders placed or performed during the hospital encounter of 05/10/17  MRSA PCR Screening     Status: None   Collection Time: 05/11/17 12:09 AM  Result Value Ref Range Status   MRSA by PCR NEGATIVE NEGATIVE Final    Comment:        The GeneXpert MRSA Assay (FDA approved for NASAL specimens only), is one component of a comprehensive MRSA colonization surveillance program. It is not intended to diagnose MRSA infection nor to guide or monitor treatment for MRSA infections. Performed at [facility], [place] [address], [place], [place] [address]   Culture, respiratory (NON-Expectorated)     Status: None (Preliminary result)   Collection Time: 05/15/17  9:29 AM  Result Value Ref Range Status   Specimen Description TRACHEAL ASPIRATE  Final   Special Requests Normal  Final   Gram Stain   Final    FEW WBC PRESENT, PREDOMINANTLY PMN NO ORGANISMS SEEN    Culture   Final    CULTURE REINCUBATED FOR BETTER GROWTH Performed at [facility], [place] [address] [place], [place] [address]    Report Status PENDING  Incomplete    Anti-infectives:  Anti-infectives (From admission, onward)   Start  Dose/Rate Route Frequency Ordered Stop   05/10/17 1330  ceFAZolin (ANCEF) IVPB 2g/100 mL premix    Comments:  To OR   2 g 200 mL/hr over 30 Minutes Intravenous  Once 05/10/17 1329 05/10/17 1405      Best Practice/Protocols:  VTE Prophylaxis: Mechanical GI Prophylaxis: Proton Pump Inhibitor Continous Sedation Precedex  Consults:     Events:  Subjective:    Overnight Issues: Extubated yesterday.   Objective:  Vital signs for last 24 hours: Temp:  [99.1 F (37.3 C)-100.9 F (38.3 C)] 99.1 F (37.3 C) (03/09 0317) Pulse Rate:  [66-106] 92 (03/09 0600) Resp:  [20-30] 24 (03/09 0600) BP: (132-212)/(53-91) 181/68 (03/09  0600) SpO2:  [94 %-100 %] 95 % (03/09 0600)  Hemodynamic parameters for last 24 hours:    Intake/Output from previous day: 03/08 0701 - 03/09 0700 In: 1632.4 [I.V.:1493.1; NG/GT:139.3] Out: 2320 [Urine:1850; Drains:30; Chest Tube:440]  Intake/Output this shift: No intake/output data recorded.  Vent settings for last 24 hours:    Physical Exam:  General: alert and no respiratory distress Neuro: alert, nonfocal exam,  following commands HEENT/Neck: wnl Resp: clear on right, decreased breath sounds on left and No rhonchi this am.  CXR shows near white-out left chest, no mediastinal shift. L chest tube with serous drainage- actively draining CVS: regular rate and rhythm, S1, S2 normal, no murmur, click, rub or gallop. hypertensive GI: soft, nontender, midline incision c/d/i without signs of infection. JP output SS Extremities: no edema, no erythema, pulses WNL  Results for orders placed or performed during the hospital encounter of 05/10/17 (from the past 24 hour(s))  I-STAT 3, arterial blood gas (G3+)     Status: Abnormal   Collection Time: 05/16/17  9:47 AM  Result Value Ref Range   pH, Arterial 7.396 7.350 - 7.450   pCO2 arterial 36.2 32.0 - 48.0 mmHg   pO2, Arterial 110.0 (H) 83.0 - 108.0 mmHg   Bicarbonate 22.3 20.0 - 28.0 mmol/L   TCO2 23 22 - 32 mmol/L   O2 Saturation 98.0 %   Acid-base deficit 2.0 0.0 - 2.0 mmol/L   Patient temperature 98.3 F    Collection site RADIAL, ALLEN'S TEST ACCEPTABLE    Drawn by RT    Sample type ARTERIAL   Glucose, capillary     Status: Abnormal   Collection Time: 05/16/17 11:10 AM  Result Value Ref Range   Glucose-Capillary 115 (H) 65 - 99 mg/dL  Glucose, capillary     Status: Abnormal   Collection Time: 05/16/17  3:16 PM  Result Value Ref Range   Glucose-Capillary 102 (H) 65 - 99 mg/dL  Glucose, capillary     Status: Abnormal   Collection Time: 05/16/17  7:21 PM  Result Value Ref Range   Glucose-Capillary 127 (H) 65 - 99 mg/dL   Glucose, capillary     Status: Abnormal   Collection Time: 05/16/17 11:28 PM  Result Value Ref Range   Glucose-Capillary 135 (H) 65 - 99 mg/dL  Glucose, capillary     Status: Abnormal   Collection Time: 05/17/17  3:16 AM  Result Value Ref Range   Glucose-Capillary 108 (H) 65 - 99 mg/dL  CBC with Differential/Platelet     Status: Abnormal (Preliminary result)   Collection Time: 05/17/17  6:41 AM  Result Value Ref Range   WBC 16.8 (H) 4.0 - 10.5 K/uL   RBC 3.45 (L) 4.22 - 5.81 MIL/uL   Hemoglobin 8.6 (L) 13.0 - 17.0 g/dL   HCT 28.9 (L)  39.0 - 52.0 %   MCV 83.8 78.0 - 100.0 fL   MCH 24.9 (L) 26.0 - 34.0 pg   MCHC 29.8 (L) 30.0 - 36.0 g/dL   RDW 20.1 (H) 11.5 - 15.5 %   Platelets 422 (H) 150 - 400 K/uL   Neutrophils Relative % PENDING %   Neutro Abs PENDING 1.7 - 7.7 K/uL   Band Neutrophils PENDING %   Lymphocytes Relative PENDING %   Lymphs Abs PENDING 0.7 - 4.0 K/uL   Monocytes Relative PENDING %   Monocytes Absolute PENDING 0.1 - 1.0 K/uL   Eosinophils Relative PENDING %   Eosinophils Absolute PENDING 0.0 - 0.7 K/uL   Basophils Relative PENDING %   Basophils Absolute PENDING 0.0 - 0.1 K/uL   WBC Morphology PENDING    RBC Morphology PENDING    Smear Review PENDING    nRBC PENDING 0 /100 WBC   Metamyelocytes Relative PENDING %   Myelocytes PENDING %   Promyelocytes Absolute PENDING %   Blasts PENDING %  Basic metabolic panel     Status: Abnormal   Collection Time: 05/17/17  6:41 AM  Result Value Ref Range   Sodium 153 (H) 135 - 145 mmol/L   Potassium 3.8 3.5 - 5.1 mmol/L   Chloride 118 (H) 101 - 111 mmol/L   CO2 24 22 - 32 mmol/L   Glucose, Bld 123 (H) 65 - 99 mg/dL   BUN 15 6 - 20 mg/dL   Creatinine, Ser 1.19 0.61 - 1.24 mg/dL   Calcium 8.3 (L) 8.9 - 10.3 mg/dL   GFR calc non Af Amer 59 (L) >60 mL/min   GFR calc Af Amer >60 >60 mL/min   Anion gap 11 5 - 15  Glucose, capillary     Status: None   Collection Time: 05/17/17  8:14 AM  Result Value Ref Range    Glucose-Capillary 95 65 - 99 mg/dL     Assessment/Plan:   NEURO  Altered Mental Status:  agitation, delirium and sedation   Plan: nearer baseline today.   PULM  Atelectasis/collapse (focal and bibasilar but improved)   Plan: likely mucus plug on L side. Chest PT, NT suctioning, aggressive pulm toilet today. Chest tube back to suction. Currently maintaining sats. Repeat CXR tomorrow.   CARDIO  No cardiac issues   Plan: CPM  RENAL  Hypernatremia moderate (146 - 155 meq/dl) and getting free water and on 1/2 NS infusion Recheck labs tomorrow.   Plan: Change IVF to LR.   GI  S/p splenectomy   Plan: Swallow eval today, if fails will place Cortrak  ID  No known infectious sources.  Tracheal aspirate sent yesterday in pending.   Plan: Splenectomy vaccines at discharge  HEME  Anemia acute blood loss anemia and anemia of critical illness)   Plan: Stable, No transfusion for now.  ENDO Hyperglycemia (stress related and suspected)   Plan: Added Lantus to regimen  Global Issues  L mucus plugging. Will try to break up with aggressive RT treatments. Swallow eval. Keep in ICu    LOS: 7 days   Additional comments:I reviewed the patient's new clinical lab test results. cbc/bmet and I reviewed the patients new imaging test results. cxr   Clovis Riley 05/17/2017
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
## 2017-05-17 NOTE — Progress Notes (Signed)
Cortrak Tube Team Note:  Consult received to place a Cortrak feeding tube.   A 10 F Cortrak tube was placed in the L nare and secured with a nasal bridle at 82 cm. Per the Cortrak monitor reading the tube tip is postpyloric, approximately D1.   No x-ray is required. RN may begin using tube.    If the tube becomes dislodged please keep the tube and contact the Cortrak team at [email or web address] (password [email or web address]) for replacement.  If after hours and replacement cannot be delayed, place a NG tube and confirm placement with an abdominal x-ray.   Burtis Junes RD, LDN, CNSC Clinical Nutrition Pager: [phone] 05/17/2017 12:29 PM

## 2017-05-17 NOTE — Progress Notes (Signed)
RT instructed pt on the use of flutter valve and incentive spirometer.  Pt has weak effort using flutter valve but with coaching was able to use it 10 consecutive times followed by a small cough.  Pt was able to reach 500 mL using incentive spirometer.  Following the IS and flutter valve, RT placed pt on 10 Min CPT using the bed.  Pt had a small productive cough following the treatments.

## 2017-05-18 ENCOUNTER — Inpatient Hospital Stay (HOSPITAL_COMMUNITY): Payer: No Typology Code available for payment source

## 2017-05-18 LAB — BASIC METABOLIC PANEL
Anion gap: 8 (ref 5–15)
BUN: 14 mg/dL (ref 6–20)
CHLORIDE: 120 mmol/L — AB (ref 101–111)
CO2: 24 mmol/L (ref 22–32)
CREATININE: 1.21 mg/dL (ref 0.61–1.24)
Calcium: 8.3 mg/dL — ABNORMAL LOW (ref 8.9–10.3)
GFR calc Af Amer: >60 mL/min (ref >60)
GFR calc non Af Amer: 58 mL/min — ABNORMAL LOW (ref >60)
Glucose, Bld: 138 mg/dL — ABNORMAL HIGH (ref 65–99)
Potassium: 3.8 mmol/L (ref 3.5–5.1)
Sodium: 152 mmol/L — ABNORMAL HIGH (ref 135–145)

## 2017-05-18 LAB — CBC
HEMATOCRIT: 31 % — AB (ref 39.0–52.0)
HEMOGLOBIN: 9.1 g/dL — AB (ref 13.0–17.0)
MCH: 24.6 pg — ABNORMAL LOW (ref 26.0–34.0)
MCHC: 29.4 g/dL — AB (ref 30.0–36.0)
MCV: 83.8 fL (ref 78.0–100.0)
Platelets: 465 10*3/uL — ABNORMAL HIGH (ref 150–400)
RBC: 3.7 MIL/uL — ABNORMAL LOW (ref 4.22–5.81)
RDW: 20.8 % — ABNORMAL HIGH (ref 11.5–15.5)
WBC: 17.5 10*3/uL — ABNORMAL HIGH (ref 4.0–10.5)

## 2017-05-18 LAB — GLUCOSE, CAPILLARY
GLUCOSE-CAPILLARY: 117 mg/dL — AB (ref 65–99)
GLUCOSE-CAPILLARY: 137 mg/dL — AB (ref 65–99)
GLUCOSE-CAPILLARY: 138 mg/dL — AB (ref 65–99)
Glucose-Capillary: 151 mg/dL — ABNORMAL HIGH (ref 65–99)
Glucose-Capillary: 143 mg/dL — ABNORMAL HIGH (ref 65–99)

## 2017-05-18 LAB — MAGNESIUM: Magnesium: 2.1 mg/dL (ref 1.7–2.4)

## 2017-05-18 MED ORDER — PIVOT 1.5 CAL PO LIQD
1000.0000 mL | ORAL | Status: AC
  Administered 2017-05-18 (×2): 1000 mL

## 2017-05-18 MED ORDER — PIVOT 1.5 CAL PO LIQD: 1000.0000 mL | ORAL | Status: DC

## 2017-05-18 MED ORDER — PIVOT 1.5 CAL PO LIQD
1000.0000 mL | ORAL | Status: DC
  Administered 2017-05-19: 1000 mL
  Filled 2017-05-18 (×2): qty 1000
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
## 2017-05-18 NOTE — Progress Notes (Signed)
Pt continues to c/o abdominal pain. 1 mg dilaudid was given with little relief. Pt stated, "I feel like I'm dying." Abdomen is slightly distended, but soft. BS hypoactive. BP 173/81 HR 76 O2 100% on 2L Dr. Hulen Skains of [facility] notified. No new orders at this time. Will continue to monitor and update as needed.

## 2017-05-19 ENCOUNTER — Inpatient Hospital Stay (HOSPITAL_COMMUNITY): Payer: No Typology Code available for payment source

## 2017-05-19 LAB — CBC WITH DIFFERENTIAL/PLATELET
BASOS ABS: 0.1 10*3/uL (ref 0.0–0.1)
Basophils Relative: 0 %
Eosinophils Absolute: 0.6 10*3/uL (ref 0.0–0.7)
Eosinophils Relative: 3 %
HEMATOCRIT: 28.9 % — AB (ref 39.0–52.0)
Hemoglobin: 8.5 g/dL — ABNORMAL LOW (ref 13.0–17.0)
LYMPHS ABS: 1.6 10*3/uL (ref 0.7–4.0)
LYMPHS PCT: 10 %
MCH: 24.6 pg — AB (ref 26.0–34.0)
MCHC: 29.4 g/dL — ABNORMAL LOW (ref 30.0–36.0)
MCV: 83.8 fL (ref 78.0–100.0)
MONO ABS: 1.3 10*3/uL — AB (ref 0.1–1.0)
Monocytes Relative: 8 %
NEUTROS ABS: 12.9 10*3/uL — AB (ref 1.7–7.7)
Neutrophils Relative %: 79 %
Platelets: 488 10*3/uL — ABNORMAL HIGH (ref 150–400)
RBC: 3.45 MIL/uL — AB (ref 4.22–5.81)
RDW: 20.9 % — ABNORMAL HIGH (ref 11.5–15.5)
WBC: 16.4 10*3/uL — AB (ref 4.0–10.5)

## 2017-05-19 LAB — COMPREHENSIVE METABOLIC PANEL
ALT: 21 U/L (ref 17–63)
AST: 42 U/L — AB (ref 15–41)
Albumin: 2.1 g/dL — ABNORMAL LOW (ref 3.5–5.0)
Alkaline Phosphatase: 65 U/L (ref 38–126)
Anion gap: 7 (ref 5–15)
BILIRUBIN TOTAL: 0.8 mg/dL (ref 0.3–1.2)
BUN: 15 mg/dL (ref 6–20)
CO2: 25 mmol/L (ref 22–32)
CREATININE: 1.23 mg/dL (ref 0.61–1.24)
Calcium: 8.1 mg/dL — ABNORMAL LOW (ref 8.9–10.3)
Chloride: 121 mmol/L — ABNORMAL HIGH (ref 101–111)
GFR calc Af Amer: >60 mL/min (ref >60)
GFR, EST NON AFRICAN AMERICAN: 57 mL/min — AB (ref >60)
Glucose, Bld: 161 mg/dL — ABNORMAL HIGH (ref 65–99)
Potassium: 3.4 mmol/L — ABNORMAL LOW (ref 3.5–5.1)
Sodium: 153 mmol/L — ABNORMAL HIGH (ref 135–145)
TOTAL PROTEIN: 5.8 g/dL — AB (ref 6.5–8.1)

## 2017-05-19 LAB — GLUCOSE, CAPILLARY
GLUCOSE-CAPILLARY: 109 mg/dL — AB (ref 65–99)
GLUCOSE-CAPILLARY: 139 mg/dL — AB (ref 65–99)
GLUCOSE-CAPILLARY: 274 mg/dL — AB (ref 65–99)
Glucose-Capillary: 96 mg/dL (ref 65–99)
Glucose-Capillary: 122 mg/dL — ABNORMAL HIGH (ref 65–99)
Glucose-Capillary: 169 mg/dL — ABNORMAL HIGH (ref 65–99)
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
## 2017-05-19 NOTE — Progress Notes (Signed)
Follow up - Trauma and Critical Care  Patient Details:    Malik [name] is an 72 y.o. male.  Lines/tubes : Chest Tube 1 Lateral;Left Mediastinal (Active)  Suction -20 cm H2O 05/18/2017  8:00 PM  Chest Tube Air Leak None 05/18/2017  8:00 PM  Patency Intervention Tip/tilt 05/18/2017  8:00 PM  Drainage Description Serous 05/18/2017  8:00 PM  Dressing Status Dry;Intact;Old drainage 05/18/2017  8:00 PM  Dressing Intervention Dressing changed 05/18/2017  8:00 AM  Site Assessment Clean;Dry;Intact 05/18/2017  8:00 PM  Surrounding Skin Dry;Intact 05/18/2017  8:00 PM  Output (mL) 480 mL 05/19/2017  7:00 AM     Closed System Drain 1 Right Abdomen Bulb (JP) 19 Fr. (Active)  Site Description Unremarkable 05/18/2017  8:00 PM  Dressing Status Dry;Intact;Old drainage 05/18/2017  8:00 PM  Drainage Appearance Dark red 05/18/2017  8:00 PM  Status To suction (Charged) 05/18/2017  8:00 PM  Intake (mL) 0 ml 05/11/2017  6:15 PM  Output (mL) 20 mL 05/19/2017  6:40 AM     External Urinary Catheter (Active)  Collection Container Standard drainage bag 05/18/2017  8:00 PM  Securement Method Securing device (Describe) 05/18/2017  8:00 PM  Intervention Equipment Changed 05/18/2017  8:00 AM  Output (mL) 125 mL 05/19/2017  1:00 AM    Microbiology/Sepsis markers: Results for orders placed or performed during the hospital encounter of 05/10/17  MRSA PCR Screening     Status: None   Collection Time: 05/11/17 12:09 AM  Result Value Ref Range Status   MRSA by PCR NEGATIVE NEGATIVE Final    Comment:        The GeneXpert MRSA Assay (FDA approved for NASAL specimens only), is one component of a comprehensive MRSA colonization surveillance program. It is not intended to diagnose MRSA infection nor to guide or monitor treatment for MRSA infections. Performed at [facility], [place] [address], [place], [place] [address]   Culture, respiratory (NON-Expectorated)     Status: None   Collection Time: 05/15/17  9:29 AM   Result Value Ref Range Status   Specimen Description TRACHEAL ASPIRATE  Final   Special Requests Normal  Final   Gram Stain   Final    FEW WBC PRESENT, PREDOMINANTLY PMN NO ORGANISMS SEEN    Culture   Final    FEW Consistent with normal respiratory flora. Performed at [facility], [place] [address], [place], [place] [address]    Report Status 05/17/2017 FINAL  Final    Anti-infectives:  Anti-infectives (From admission, onward)   Start     Dose/Rate Route Frequency Ordered Stop   05/10/17 1330  ceFAZolin (ANCEF) IVPB 2g/100 mL premix    Comments:  To OR   2 g 200 mL/hr over 30 Minutes Intravenous  Once 05/10/17 1329 05/10/17 1405      Best Practice/Protocols:  VTE Prophylaxis: Lovenox (prophylaxtic dose) and Mechanical   Consults:     Events:  Chief Complaint/Subjective:    Overnight Issues: 733ml Ct drainage last 24h, no events overnight  Objective:  Vital signs for last 24 hours: Temp:  [98.6 F (37 C)-99.2 F (37.3 C)] 99 F (37.2 C) (03/11 0324) Pulse Rate:  [71-97] 97 (03/11 0700) Resp:  [12-25] 21 (03/11 0700) BP: (125-199)/(50-114) 165/114 (03/11 0700) SpO2:  [88 %-100 %] 93 % (03/11 0700)  Hemodynamic parameters for last 24 hours:    Intake/Output from previous day: 03/10 0701 - 03/11 0700 In: 1602 [I.V.:1150; NG/GT:452] Out: 2790 [Urine:2050; Drains:40; Chest Tube:700]  Intake/Output this shift: No intake/output data recorded.  Vent settings for last 24 hours:    Physical Exam:  Gen: NAD HEENT: dry lips, DHT in place Resp: CTAB, 2l O2 [place], no air leak, yellow clear fluid in collector Cardiovascular: RRR Abdomen: soft, NT, ND Ext: no edema Neuro: AOx3  Results for orders placed or performed during the hospital encounter of 05/10/17 (from the past 24 hour(s))  Glucose, capillary     Status: Abnormal   Collection Time: 05/18/17  4:30 PM  Result Value Ref Range   Glucose-Capillary 138 (H) 65 - 99 mg/dL  Glucose, capillary      Status: Abnormal   Collection Time: 05/18/17  8:10 PM  Result Value Ref Range   Glucose-Capillary 151 (H) 65 - 99 mg/dL  Glucose, capillary     Status: Abnormal   Collection Time: 05/18/17 11:26 PM  Result Value Ref Range   Glucose-Capillary 143 (H) 65 - 99 mg/dL   Comment 1 Notify RN    Comment 2 Document in Chart   Glucose, capillary     Status: None   Collection Time: 05/19/17  3:21 AM  Result Value Ref Range   Glucose-Capillary 96 65 - 99 mg/dL   Comment 1 Notify RN    Comment 2 Document in Chart      Assessment/Plan:    NEURO  Altered Mental Status:  agitation, delirium and sedation   Plan: alert, oriented making sense today  PULM  Atelectasis/collapse (focal and bibasilar but improved)   Plan: f/u new XR, no evidence of respiratory failure, continue current management, if XR still concerning may get CT  CARDIO  No cardiac issues   Plan: CPM  RENAL  Hypernatremia moderate (146 - 155 meq/dl) and getting free water and on 1/2 NS infusion Recheck labs tomorrow.   Plan: continue free water  GI  S/p splenectomy   Plan: cortrak in place, working with speech path  ID  No known infectious sources.  Tracheal aspirate sent yesterday in pending.   Plan: Splenectomy vaccines at discharge  HEME  Anemia acute blood loss anemia and anemia of critical illness)   Plan: stable, start chemical prophylaxis  ENDO  glucose <243ml   Plan: continue insulin  Global Issues  F/u labs and XR, possible additional imaging for over weekend respiratory concerns        LOS: 9 days   Additional comments:I reviewed the patients new imaging test results. Xr with improved aeration of the left upper lobe. continue chest tube, repeat XR in am  Critical Care Total Time*: 15 Minutes  Arta Bruce Senta Kantor 05/19/2017  *Care during the described time interval was provided by me and/or other providers on the critical care team.  I have reviewed this patient's available data, including  medical history, events of note, physical examination and test results as part of my evaluation.

## 2017-05-20 ENCOUNTER — Encounter (HOSPITAL_COMMUNITY): Payer: Self-pay

## 2017-05-20 DIAGNOSIS — R5381 Other malaise: Secondary | ICD-10-CM

## 2017-05-20 DIAGNOSIS — T07XXXA Unspecified multiple injuries, initial encounter: Secondary | ICD-10-CM

## 2017-05-20 LAB — GLUCOSE, CAPILLARY
GLUCOSE-CAPILLARY: 165 mg/dL — AB (ref 65–99)
GLUCOSE-CAPILLARY: 169 mg/dL — AB (ref 65–99)
GLUCOSE-CAPILLARY: 238 mg/dL — AB (ref 65–99)
Glucose-Capillary: 68 mg/dL (ref 65–99)
Glucose-Capillary: 107 mg/dL — ABNORMAL HIGH (ref 65–99)
Glucose-Capillary: 77 mg/dL (ref 65–99)
Glucose-Capillary: 161 mg/dL — ABNORMAL HIGH (ref 65–99)

## 2017-05-20 LAB — CBC
HEMATOCRIT: 31.3 % — AB (ref 39.0–52.0)
Hemoglobin: 9.5 g/dL — ABNORMAL LOW (ref 13.0–17.0)
MCH: 25.1 pg — AB (ref 26.0–34.0)
MCHC: 30.4 g/dL (ref 30.0–36.0)
MCV: 82.6 fL (ref 78.0–100.0)
PLATELETS: 501 10*3/uL — AB (ref 150–400)
RBC: 3.79 MIL/uL — ABNORMAL LOW (ref 4.22–5.81)
RDW: 21.4 % — AB (ref 11.5–15.5)
WBC: 19.1 10*3/uL — AB (ref 4.0–10.5)

## 2017-05-20 MED ORDER — ENSURE ENLIVE PO LIQD
237.0000 mL | Freq: Three times a day (TID) | ORAL | Status: DC
  Administered 2017-05-20 – 2017-05-27 (×11): 237 mL via ORAL

## 2017-05-20 MED ORDER — MENINGOCOCCAL A C Y&W-135 OLIG IM SOLR
0.5000 mL | Freq: Once | INTRAMUSCULAR | Status: AC
  Administered 2017-05-20: 0.5 mL via INTRAMUSCULAR
  Filled 2017-05-20: qty 0.5

## 2017-05-20 MED ORDER — PNEUMOCOCCAL 13-VAL CONJ VACC IM SUSP
0.5000 mL | Freq: Once | INTRAMUSCULAR | Status: AC
  Administered 2017-05-20: 0.5 mL via INTRAMUSCULAR
  Filled 2017-05-20: qty 0.5

## 2017-05-20 MED ORDER — ENOXAPARIN SODIUM 40 MG/0.4ML ~~LOC~~ SOLN
40.0000 mg | SUBCUTANEOUS | Status: DC
  Administered 2017-05-20 – 2017-05-27 (×7): 40 mg via SUBCUTANEOUS
  Filled 2017-05-20 (×8): qty 0.4

## 2017-05-20 MED ORDER — METOPROLOL TARTRATE 25 MG PO TABS
25.0000 mg | ORAL_TABLET | Freq: Two times a day (BID) | ORAL | Status: DC
  Administered 2017-05-20 – 2017-05-27 (×15): 25 mg via ORAL
  Filled 2017-05-20 (×15): qty 1

## 2017-05-20 MED ORDER — HAEMOPHILUS B POLYSAC CONJ VAC IM SOLR
0.5000 mL | Freq: Once | INTRAMUSCULAR | Status: AC
  Administered 2017-05-20: 0.5 mL via INTRAMUSCULAR
  Filled 2017-05-20: qty 0.5

## 2017-05-20 MED ORDER — BISACODYL 10 MG RE SUPP
10.0000 mg | Freq: Every day | RECTAL | Status: DC | PRN
  Administered 2017-05-20: 10 mg via RECTAL
  Filled 2017-05-20: qty 1

## 2017-05-20 MED ORDER — HYDRALAZINE HCL 20 MG/ML IJ SOLN
10.0000 mg | INTRAMUSCULAR | Status: DC | PRN
  Administered 2017-05-20 – 2017-05-26 (×7): 10 mg via INTRAVENOUS
  Filled 2017-05-20 (×8): qty 1
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
## 2017-05-20 NOTE — Progress Notes (Signed)
Orders for vaccinations placed.  Patient should receive all 3 today.  Malik [name] 1:33 PM 05/20/2017
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
## 2017-05-21 ENCOUNTER — Inpatient Hospital Stay (HOSPITAL_COMMUNITY): Payer: No Typology Code available for payment source

## 2017-05-21 ENCOUNTER — Encounter (HOSPITAL_COMMUNITY): Payer: Self-pay

## 2017-05-21 LAB — BASIC METABOLIC PANEL
ANION GAP: 8 (ref 5–15)
BUN: 10 mg/dL (ref 6–20)
CALCIUM: 7.6 mg/dL — AB (ref 8.9–10.3)
CO2: 24 mmol/L (ref 22–32)
Chloride: 110 mmol/L (ref 101–111)
Creatinine, Ser: 1.07 mg/dL (ref 0.61–1.24)
GFR calc Af Amer: >60 mL/min (ref >60)
Glucose, Bld: 80 mg/dL (ref 65–99)
POTASSIUM: 3.5 mmol/L (ref 3.5–5.1)
SODIUM: 142 mmol/L (ref 135–145)

## 2017-05-21 LAB — GLUCOSE, CAPILLARY
GLUCOSE-CAPILLARY: 82 mg/dL (ref 65–99)
GLUCOSE-CAPILLARY: 88 mg/dL (ref 65–99)
GLUCOSE-CAPILLARY: 155 mg/dL — AB (ref 65–99)
GLUCOSE-CAPILLARY: 193 mg/dL — AB (ref 65–99)
Glucose-Capillary: 59 mg/dL — ABNORMAL LOW (ref 65–99)
Glucose-Capillary: 127 mg/dL — ABNORMAL HIGH (ref 65–99)

## 2017-05-21 LAB — CBC
HCT: 27.2 % — ABNORMAL LOW (ref 39.0–52.0)
Hemoglobin: 8.2 g/dL — ABNORMAL LOW (ref 13.0–17.0)
MCH: 24 pg — ABNORMAL LOW (ref 26.0–34.0)
MCHC: 30.1 g/dL (ref 30.0–36.0)
MCV: 79.5 fL (ref 78.0–100.0)
PLATELETS: 532 10*3/uL — AB (ref 150–400)
RBC: 3.42 MIL/uL — AB (ref 4.22–5.81)
RDW: 21.4 % — AB (ref 11.5–15.5)
WBC: 19.2 10*3/uL — AB (ref 4.0–10.5)

## 2017-05-21 LAB — URINALYSIS, ROUTINE W REFLEX MICROSCOPIC
BILIRUBIN URINE: NEGATIVE
Glucose, UA: NEGATIVE mg/dL
HGB URINE DIPSTICK: NEGATIVE
Ketones, ur: NEGATIVE mg/dL
Leukocytes, UA: NEGATIVE
NITRITE: NEGATIVE
PROTEIN: NEGATIVE mg/dL
Specific Gravity, Urine: 1.019 (ref 1.005–1.030)
pH: 6 (ref 5.0–8.0)

## 2017-05-21 MED ORDER — METFORMIN HCL 500 MG PO TABS
1000.0000 mg | ORAL_TABLET | Freq: Two times a day (BID) | ORAL | Status: DC
  Administered 2017-05-21 – 2017-05-26 (×8): 1000 mg via ORAL
  Filled 2017-05-21 (×9): qty 2

## 2017-05-21 MED ORDER — ATORVASTATIN CALCIUM 40 MG PO TABS
40.0000 mg | ORAL_TABLET | Freq: Every day | ORAL | Status: DC
  Administered 2017-05-21 – 2017-05-27 (×7): 40 mg via ORAL
  Filled 2017-05-21 (×7): qty 1

## 2017-05-21 MED ORDER — METHOCARBAMOL 500 MG PO TABS
500.0000 mg | ORAL_TABLET | Freq: Three times a day (TID) | ORAL | Status: DC | PRN
  Administered 2017-05-22 – 2017-05-27 (×7): 500 mg via ORAL
  Filled 2017-05-21 (×8): qty 1

## 2017-05-21 MED ORDER — OXYCODONE HCL 5 MG PO TABS
5.0000 mg | ORAL_TABLET | ORAL | Status: DC | PRN
  Administered 2017-05-21 – 2017-05-27 (×17): 5 mg via ORAL
  Filled 2017-05-21 (×19): qty 1

## 2017-05-21 MED ORDER — INSULIN ASPART 100 UNIT/ML ~~LOC~~ SOLN
0.0000 [IU] | Freq: Three times a day (TID) | SUBCUTANEOUS | Status: DC
  Administered 2017-05-22 (×2): 2 [IU] via SUBCUTANEOUS
  Administered 2017-05-26: 3 [IU] via SUBCUTANEOUS

## 2017-05-21 MED ORDER — GLIPIZIDE ER 10 MG PO TB24
10.0000 mg | ORAL_TABLET | Freq: Every day | ORAL | Status: DC
  Administered 2017-05-22 – 2017-05-27 (×5): 10 mg via ORAL
  Filled 2017-05-21 (×6): qty 1

## 2017-05-21 MED ORDER — ACETAMINOPHEN 500 MG PO TABS
1000.0000 mg | ORAL_TABLET | Freq: Three times a day (TID) | ORAL | Status: DC
  Administered 2017-05-21 – 2017-05-27 (×17): 1000 mg via ORAL
  Filled 2017-05-21 (×17): qty 2

## 2017-05-21 MED ORDER — KCL IN DEXTROSE-NACL 20-5-0.2 MEQ/L-%-% IV SOLN
INTRAVENOUS | Status: DC
  Administered 2017-05-21 – 2017-05-27 (×9): via INTRAVENOUS
  Filled 2017-05-21 (×10): qty 1000

## 2017-05-21 MED ORDER — HYDROMORPHONE HCL 1 MG/ML IJ SOLN: 0.5000 mg | INTRAMUSCULAR | Status: DC | PRN

## 2017-05-21 MED ORDER — DEXTROSE 50 % IV SOLN
INTRAVENOUS | Status: AC
  Administered 2017-05-21: 25 mL
  Filled 2017-05-21: qty 50

## 2017-05-21 NOTE — Progress Notes (Signed)
Inpatient Rehabilitation  Met with patient at bedside and spoke with son via phone to discuss team's recommendation for IP Rehab.  Shared booklets, insurance verification letter, and answered questions.  Son states that he and his sister can help his with his transition home.  Patient is in agreement with plan as well.  Plan to follow for timing of medical readiness and IP Rehab bed availability.  Call if questions.    Carmelia Roller., CCC/SLP Admission Coordinator  [place]  Cell [phone]
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
## 2017-05-21 NOTE — Progress Notes (Signed)
Pt transferred to 4NP11 but bed is not capable of doing CPT  And pt has chest tube and abdominal staples so the Vest is contraindicated at this time. RT will inquire about switching beds to allow for CPT through the bed.

## 2017-05-21 NOTE — Progress Notes (Signed)
Patient transferred to 8EF20 with no complications. Son updated by phone. Fransico Michael RN BSN.
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
## 2017-05-22 ENCOUNTER — Inpatient Hospital Stay (HOSPITAL_COMMUNITY): Payer: No Typology Code available for payment source

## 2017-05-22 ENCOUNTER — Encounter (HOSPITAL_COMMUNITY): Payer: Self-pay | Admitting: Physical Medicine and Rehabilitation

## 2017-05-22 LAB — BASIC METABOLIC PANEL
Anion gap: 7 (ref 5–15)
BUN: 12 mg/dL (ref 6–20)
CO2: 25 mmol/L (ref 22–32)
Calcium: 7.6 mg/dL — ABNORMAL LOW (ref 8.9–10.3)
Chloride: 106 mmol/L (ref 101–111)
Creatinine, Ser: 1.11 mg/dL (ref 0.61–1.24)
GFR calc Af Amer: >60 mL/min (ref >60)
GFR calc non Af Amer: >60 mL/min (ref >60)
Glucose, Bld: 132 mg/dL — ABNORMAL HIGH (ref 65–99)
POTASSIUM: 3.7 mmol/L (ref 3.5–5.1)
SODIUM: 138 mmol/L (ref 135–145)

## 2017-05-22 LAB — GLUCOSE, CAPILLARY
GLUCOSE-CAPILLARY: 134 mg/dL — AB (ref 65–99)
GLUCOSE-CAPILLARY: 127 mg/dL — AB (ref 65–99)
GLUCOSE-CAPILLARY: 63 mg/dL — AB (ref 65–99)
Glucose-Capillary: 62 mg/dL — ABNORMAL LOW (ref 65–99)
Glucose-Capillary: 84 mg/dL (ref 65–99)
Glucose-Capillary: 101 mg/dL — ABNORMAL HIGH (ref 65–99)

## 2017-05-22 LAB — CBC
HEMATOCRIT: 29.6 % — AB (ref 39.0–52.0)
HEMOGLOBIN: 8.8 g/dL — AB (ref 13.0–17.0)
MCH: 23.6 pg — AB (ref 26.0–34.0)
MCHC: 29.7 g/dL — ABNORMAL LOW (ref 30.0–36.0)
MCV: 79.4 fL (ref 78.0–100.0)
Platelets: 635 10*3/uL — ABNORMAL HIGH (ref 150–400)
RBC: 3.73 MIL/uL — AB (ref 4.22–5.81)
RDW: 21.6 % — ABNORMAL HIGH (ref 11.5–15.5)
WBC: 20.8 10*3/uL — ABNORMAL HIGH (ref 4.0–10.5)

## 2017-05-22 LAB — URINE CULTURE: CULTURE: NO GROWTH

## 2017-05-22 MED ORDER — AMLODIPINE BESYLATE 5 MG PO TABS
5.0000 mg | ORAL_TABLET | Freq: Every day | ORAL | Status: DC
  Administered 2017-05-22 – 2017-05-27 (×6): 5 mg via ORAL
  Filled 2017-05-22 (×6): qty 1

## 2017-05-22 MED ORDER — ORAL CARE MOUTH RINSE
15.0000 mL | Freq: Two times a day (BID) | OROMUCOSAL | Status: DC
  Administered 2017-05-22 – 2017-05-27 (×7): 15 mL via OROMUCOSAL

## 2017-05-22 MED ORDER — POLYETHYLENE GLYCOL 3350 17 G PO PACK
17.0000 g | PACK | Freq: Every day | ORAL | Status: DC
  Administered 2017-05-22 – 2017-05-27 (×5): 17 g via ORAL
  Filled 2017-05-22 (×5): qty 1

## 2017-05-22 MED ORDER — DOCUSATE SODIUM 100 MG PO CAPS
100.0000 mg | ORAL_CAPSULE | Freq: Two times a day (BID) | ORAL | Status: DC
  Administered 2017-05-22 – 2017-05-27 (×10): 100 mg via ORAL
  Filled 2017-05-22 (×11): qty 1

## 2017-05-22 MED ORDER — VITAMIN B-1 100 MG PO TABS
100.0000 mg | ORAL_TABLET | Freq: Every day | ORAL | Status: DC
  Administered 2017-05-22 – 2017-05-27 (×6): 100 mg via ORAL
  Filled 2017-05-22 (×5): qty 1

## 2017-05-22 MED ORDER — FOLIC ACID 1 MG PO TABS
1.0000 mg | ORAL_TABLET | Freq: Every day | ORAL | Status: DC
  Administered 2017-05-22 – 2017-05-27 (×6): 1 mg via ORAL
  Filled 2017-05-22 (×6): qty 1

## 2017-05-22 NOTE — Progress Notes (Signed)
Hypoglycemic Event  CBG: 62 @ 1626  Treatment: 15 GM carbohydrate snack  Symptoms: None  Follow-up CBG: [id number] CBG Result: 84  Possible Reasons for Event: Inadequate meal intake;  Comments/MD notified:  pt did not eat lunch, had hiccups and was feeling uncomfortable, discussed this with Margie Billet PA in afteroon      [place], [place]

## 2017-05-22 NOTE — Care Management Important Message (Signed)
Important Message  Patient Details  Name: Malik [name] MRN: [id number] Date of Birth: [birth year]   Medicare Important Message Given:  Yes    Orbie Pyo 05/22/2017, 12:35 PM
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
## 2017-05-22 NOTE — Progress Notes (Signed)
Inpatient Rehabilitation  Continuing to follow along for timing of medical readiness prior to hopeful IP Rehab admission.  Call if questions.   Carmelia Roller., CCC/SLP Admission Coordinator  [facility]  Cell [phone]

## 2017-05-22 NOTE — Discharge Instructions (Signed)
You will need the second pneumococcal vaccine in 6 week by your primary care provider since your spleen was removed.  You already had the first 3 vaccinations that are required.  [facility], [place] [phone]  OPEN ABDOMINAL SURGERY: POST OP INSTRUCTIONS  Always review your discharge instruction sheet given to you by the facility where your surgery was performed.  IF YOU HAVE DISABILITY OR FAMILY LEAVE FORMS, YOU MUST BRING THEM TO THE OFFICE FOR PROCESSING.  PLEASE DO NOT GIVE THEM TO YOUR DOCTOR.  1. A prescription for pain medication may be given to you upon discharge.  Take your pain medication as prescribed, if needed.  If narcotic pain medicine is not needed, then you may take acetaminophen (Tylenol) or ibuprofen (Advil) as needed. 2. Take your usually prescribed medications unless otherwise directed. 3. If you need a refill on your pain medication, please contact your pharmacy. They will contact our office to request authorization.  Prescriptions will not be filled after 5pm or on week-ends. 4. You should follow a light diet the first few days after arrival home, such as soup and crackers, pudding, etc.unless your doctor has advised otherwise. A high-fiber, low fat diet can be resumed as tolerated.   Be sure to include lots of fluids daily. Most patients will experience some swelling and bruising on the chest and neck area.  Ice packs will help.  Swelling and bruising can take several days to resolve 5. Most patients will experience some swelling and bruising in the area of the incision. Ice pack will help. Swelling and bruising can take several days to resolve..  6. It is common to experience some constipation if taking pain medication after surgery.  Increasing fluid intake and taking a stool softener will usually help or prevent this problem from occurring.  A mild laxative (Milk of Magnesia or Miralax) should be taken according to package directions if there are no  bowel movements after 48 hours. 7.  You may have steri-strips (small skin tapes) in place directly over the incision.  These strips should be left on the skin for 7-10 days.  If your surgeon used skin glue on the incision, you may shower in 24 hours.  The glue will flake off over the next 2-3 weeks.  Any sutures or staples will be removed at the office during your follow-up visit. You may find that a light gauze bandage over your incision may keep your staples from being rubbed or pulled. You may shower and replace the bandage daily. 8. ACTIVITIES:  You may resume regular (light) daily activities beginning the next day--such as daily self-care, walking, climbing stairs--gradually increasing activities as tolerated.  You may have sexual intercourse when it is comfortable.  Refrain from any heavy lifting or straining until approved by your doctor. a. You may drive when you no longer are taking prescription pain medication, you can comfortably wear a seatbelt, and you can safely maneuver your car and apply brakes b. Return to Work: ___________________________________ 48. You should see your doctor in the office for a follow-up appointment approximately two weeks after your surgery.  Make sure that you call for this appointment within a day or two after you arrive home to insure a convenient appointment time. OTHER INSTRUCTIONS:  _____________________________________________________________ _____________________________________________________________  WHEN TO CALL YOUR DOCTOR: 1. Fever over 101.0 2. Inability to urinate 3. Nausea and/or vomiting 4. Extreme swelling or bruising 5. Continued bleeding from incision. 6. Increased pain, redness, or  drainage from the incision. 7. Difficulty swallowing or breathing 8. Muscle cramping or spasms. 9. Numbness or tingling in hands or feet or around lips.  The clinic staff is available to answer your questions during regular business hours.  Please dont  hesitate to call and ask to speak to one of the nurses if you have concerns.  For further questions, please visit [email or web address]
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
## 2017-05-23 ENCOUNTER — Inpatient Hospital Stay (HOSPITAL_COMMUNITY): Payer: No Typology Code available for payment source

## 2017-05-23 LAB — CBC
HCT: 29.5 % — ABNORMAL LOW (ref 39.0–52.0)
Hemoglobin: 8.8 g/dL — ABNORMAL LOW (ref 13.0–17.0)
MCH: 23.5 pg — AB (ref 26.0–34.0)
MCHC: 29.8 g/dL — AB (ref 30.0–36.0)
MCV: 78.7 fL (ref 78.0–100.0)
Platelets: 646 10*3/uL — ABNORMAL HIGH (ref 150–400)
RBC: 3.75 MIL/uL — AB (ref 4.22–5.81)
RDW: 21.7 % — ABNORMAL HIGH (ref 11.5–15.5)
WBC: 22 10*3/uL — ABNORMAL HIGH (ref 4.0–10.5)

## 2017-05-23 LAB — BASIC METABOLIC PANEL
Anion gap: 10 (ref 5–15)
BUN: 9 mg/dL (ref 6–20)
CHLORIDE: 105 mmol/L (ref 101–111)
CO2: 22 mmol/L (ref 22–32)
CREATININE: 1.28 mg/dL — AB (ref 0.61–1.24)
Calcium: 7.8 mg/dL — ABNORMAL LOW (ref 8.9–10.3)
GFR calc Af Amer: >60 mL/min (ref >60)
GFR calc non Af Amer: 54 mL/min — ABNORMAL LOW (ref >60)
GLUCOSE: 77 mg/dL (ref 65–99)
POTASSIUM: 3.8 mmol/L (ref 3.5–5.1)
SODIUM: 137 mmol/L (ref 135–145)

## 2017-05-23 LAB — GLUCOSE, CAPILLARY
GLUCOSE-CAPILLARY: 79 mg/dL (ref 65–99)
GLUCOSE-CAPILLARY: 127 mg/dL — AB (ref 65–99)
Glucose-Capillary: 70 mg/dL (ref 65–99)
Glucose-Capillary: 104 mg/dL — ABNORMAL HIGH (ref 65–99)
Glucose-Capillary: 71 mg/dL (ref 65–99)

## 2017-05-23 MED ORDER — TAB-A-VITE/IRON PO TABS
1.0000 | ORAL_TABLET | Freq: Every day | ORAL | Status: DC
  Administered 2017-05-25 – 2017-05-27 (×3): 1 via ORAL
  Filled 2017-05-23 (×5): qty 1

## 2017-05-23 MED ORDER — QUETIAPINE FUMARATE 50 MG PO TABS
50.0000 mg | ORAL_TABLET | Freq: Two times a day (BID) | ORAL | Status: DC
  Administered 2017-05-23 – 2017-05-25 (×5): 50 mg via ORAL
  Filled 2017-05-23 (×6): qty 1

## 2017-05-23 MED ORDER — LORAZEPAM 1 MG PO TABS
1.0000 mg | ORAL_TABLET | Freq: Once | ORAL | Status: AC
  Administered 2017-05-23: 1 mg via ORAL
  Filled 2017-05-23: qty 1
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
## 2017-05-23 NOTE — Progress Notes (Signed)
Results for KEMOND, AMORIN (MRN [id number]) as of 05/23/2017 11:28  Ref. Range 05/22/2017 16:47 05/22/2017 21:49 05/22/2017 22:19 05/23/2017 03:55 05/23/2017 08:31  Glucose-Capillary Latest Ref Range: 65 - 99 mg/dL 84 63 (L) 101 (H) 70 79  Noted that blood sugars have been less than 100 mg/dl and also less than 70 mg/dl. Recommend discontinuing Lantus 10 units daily. Continue Novolog correction scale and consider discharging patient on oral agents. Will need follow up with a PCP at discharge.   Harvel Ricks RN BSN CDE Diabetes Coordinator Pager: [phone]  8am-5pm
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
## 2017-05-23 NOTE — Progress Notes (Signed)
Inpatient Rehabilitation  Continuing to follow along for timing of medical readiness and post acute rehab needs.  Note chest tube still in with continued output.  Plan to follow up Monday.  Call if questions.   Carmelia Roller., CCC/SLP Admission Coordinator  [facility]  Cell [phone]

## 2017-05-23 NOTE — Progress Notes (Signed)
CPT not performed at this time. Patient is in pain. Unable to use Flutter.

## 2017-05-24 ENCOUNTER — Inpatient Hospital Stay (HOSPITAL_COMMUNITY): Payer: No Typology Code available for payment source

## 2017-05-24 LAB — BASIC METABOLIC PANEL
Anion gap: 10 (ref 5–15)
BUN: 9 mg/dL (ref 6–20)
CALCIUM: 7.7 mg/dL — AB (ref 8.9–10.3)
CHLORIDE: 106 mmol/L (ref 101–111)
CO2: 20 mmol/L — ABNORMAL LOW (ref 22–32)
CREATININE: 1.19 mg/dL (ref 0.61–1.24)
GFR calc Af Amer: >60 mL/min (ref >60)
GFR, EST NON AFRICAN AMERICAN: 59 mL/min — AB (ref >60)
Glucose, Bld: 60 mg/dL — ABNORMAL LOW (ref 65–99)
Potassium: 4 mmol/L (ref 3.5–5.1)
Sodium: 136 mmol/L (ref 135–145)

## 2017-05-24 LAB — GLUCOSE, CAPILLARY
GLUCOSE-CAPILLARY: 71 mg/dL (ref 65–99)
Glucose-Capillary: 76 mg/dL (ref 65–99)

## 2017-05-24 LAB — CBC
HCT: 27.4 % — ABNORMAL LOW (ref 39.0–52.0)
Hemoglobin: 8.4 g/dL — ABNORMAL LOW (ref 13.0–17.0)
MCH: 24.3 pg — AB (ref 26.0–34.0)
MCHC: 30.7 g/dL (ref 30.0–36.0)
MCV: 79.4 fL (ref 78.0–100.0)
PLATELETS: 739 10*3/uL — AB (ref 150–400)
RBC: 3.45 MIL/uL — ABNORMAL LOW (ref 4.22–5.81)
RDW: 20.8 % — AB (ref 11.5–15.5)
WBC: 20.6 10*3/uL — ABNORMAL HIGH (ref 4.0–10.5)

## 2017-05-24 NOTE — Progress Notes (Signed)
Patient physically and verbally aggressive towards staff.  Refusing medication.  B/P elevated to 229/125.  Disoriented x 3.  Still attempting to pull out IV lines and chest tube.  Spitting at staff.  Remains in arm restraints.  Refusing to eat.  Refusing care.

## 2017-05-24 NOTE — Progress Notes (Signed)
MD aware of patient's refusal to take medication and his aggressiveness towards staff.  Patient now calmer.  Family alerted to patient's ongoing delirium and are understandably concerned.  Will keep close monitoring of him
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
## 2017-05-25 LAB — GLUCOSE, CAPILLARY
GLUCOSE-CAPILLARY: 70 mg/dL (ref 65–99)
GLUCOSE-CAPILLARY: 93 mg/dL (ref 65–99)
GLUCOSE-CAPILLARY: 116 mg/dL — AB (ref 65–99)
GLUCOSE-CAPILLARY: 222 mg/dL — AB (ref 65–99)
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
## 2017-05-25 NOTE — Progress Notes (Signed)
Patient's son called to speak with me re:  Patient's status.  He states family is concerned re:  his taking an anti-psychotic to assist in impulse control.  He also states that he wishes to speak with a physician as soon as they round on patient today.  Patient is calm and subdued after taking medication for pain with his other medications.  Family aware that this medication takes several days to take effect and that his symptoms may be caused by something as yet unknown.  Son wishes to be called for update by MD.

## 2017-05-26 ENCOUNTER — Inpatient Hospital Stay (HOSPITAL_COMMUNITY): Payer: No Typology Code available for payment source

## 2017-05-26 LAB — BASIC METABOLIC PANEL
Anion gap: 10 (ref 5–15)
BUN: 9 mg/dL (ref 6–20)
CALCIUM: 7.5 mg/dL — AB (ref 8.9–10.3)
CO2: 19 mmol/L — ABNORMAL LOW (ref 22–32)
CREATININE: 1.11 mg/dL (ref 0.61–1.24)
Chloride: 105 mmol/L (ref 101–111)
GFR calc non Af Amer: >60 mL/min (ref >60)
Glucose, Bld: 115 mg/dL — ABNORMAL HIGH (ref 65–99)
Potassium: 4.1 mmol/L (ref 3.5–5.1)
SODIUM: 134 mmol/L — AB (ref 135–145)

## 2017-05-26 LAB — GLUCOSE, CAPILLARY
GLUCOSE-CAPILLARY: 118 mg/dL — AB (ref 65–99)
GLUCOSE-CAPILLARY: 72 mg/dL (ref 65–99)
GLUCOSE-CAPILLARY: 71 mg/dL (ref 65–99)
Glucose-Capillary: 185 mg/dL — ABNORMAL HIGH (ref 65–99)
Glucose-Capillary: 69 mg/dL (ref 65–99)

## 2017-05-26 LAB — PATHOLOGIST SMEAR REVIEW

## 2017-05-26 LAB — CBC
HCT: 27.2 % — ABNORMAL LOW (ref 39.0–52.0)
Hemoglobin: 8.1 g/dL — ABNORMAL LOW (ref 13.0–17.0)
MCH: 23.3 pg — ABNORMAL LOW (ref 26.0–34.0)
MCHC: 29.8 g/dL — AB (ref 30.0–36.0)
MCV: 78.2 fL (ref 78.0–100.0)
Platelets: 964 10*3/uL (ref 150–400)
RBC: 3.48 MIL/uL — ABNORMAL LOW (ref 4.22–5.81)
RDW: 20.4 % — AB (ref 11.5–15.5)
WBC: 17.2 10*3/uL — ABNORMAL HIGH (ref 4.0–10.5)

## 2017-05-26 MED ORDER — CLONAZEPAM 0.5 MG PO TABS
0.5000 mg | ORAL_TABLET | Freq: Two times a day (BID) | ORAL | Status: DC | PRN
  Administered 2017-05-26 – 2017-05-27 (×2): 0.5 mg via ORAL
  Filled 2017-05-26 (×2): qty 1

## 2017-05-26 NOTE — Progress Notes (Signed)
Inpatient Diabetes Program Recommendations  AACE/ADA: New Consensus Statement on Inpatient Glycemic Control (2015)  Target Ranges:  Prepandial:   less than 140 mg/dL      Peak postprandial:   less than 180 mg/dL (1-2 hours)      Critically ill patients:  140 - 180 mg/dL   Results for Malik [name], Malik [name] (MRN [id number]) as of 05/26/2017 09:53  Ref. Range 05/25/2017 07:49 05/25/2017 12:23 05/25/2017 16:31 05/25/2017 21:45 05/26/2017 07:24  Glucose-Capillary Latest Ref Range: 65 - 99 mg/dL 70 93 116 (H) 222 (H) 118 (H)  Results for Malik [name], Malik [name] (MRN [id number]) as of 05/26/2017 09:53  Ref. Range 05/23/2017 03:55 05/23/2017 08:31 05/23/2017 11:26 05/23/2017 16:58 05/23/2017 20:34 05/24/2017 11:47 05/24/2017 20:18  Glucose-Capillary Latest Ref Range: 65 - 99 mg/dL 70 79 104 (H) 71 127 (H) 71 76   Review of Glycemic Control  Diabetes history: DM2 Outpatient Diabetes medications: Glipizide XL 10 mg QAM, Metformin 1000 mg BID Current orders for Inpatient glycemic control: Glipizide XL 10 mg QAM, Metformin 1000 mg BID, Lantus 10 units QHS, Novolog 0-15 units TID with meals  Inpatient Diabetes Program Recommendations:  Oral Agents: Noted Glipizide was NOT GIVEN on 05/25/17. If patient continues to have glucose values less than 80 mg/dl,  would recommend discontinuing Glipizide.  Thanks, Barnie Alderman, RN, MSN, CDE Diabetes Coordinator Inpatient Diabetes Program [phone] (Team Pager from 8am to 5pm)
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
## 2017-05-26 NOTE — Progress Notes (Signed)
Inpatient Rehabilitation  Continuing to follow along for timing of medical readiness as well as post acute rehab needs. Note that chest tube was removed and being monitored for drainage.  Call if questions.   Carmelia Roller., CCC/SLP Admission Coordinator  [place]  Cell [phone]
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
## 2017-05-26 NOTE — Progress Notes (Signed)
Patient BP is 175/71 HR is 81, administered Hydralazine 10 mg IVP, see EMAR. Will continue to monitor BP.

## 2017-05-27 ENCOUNTER — Inpatient Hospital Stay (HOSPITAL_COMMUNITY)
Admission: RE | Admit: 2017-05-27 | Discharge: 2017-06-07 | DRG: 945 | Disposition: A | Discharge status: Home Health | Payer: Medicare Other | Source: Intra-hospital | Attending: Physical Medicine & Rehabilitation | Admitting: Physical Medicine & Rehabilitation

## 2017-05-27 ENCOUNTER — Inpatient Hospital Stay (HOSPITAL_COMMUNITY): Payer: No Typology Code available for payment source

## 2017-05-27 ENCOUNTER — Inpatient Hospital Stay (HOSPITAL_COMMUNITY): Payer: Medicare Other

## 2017-05-27 ENCOUNTER — Encounter (HOSPITAL_COMMUNITY): Payer: Self-pay | Admitting: *Deleted

## 2017-05-27 ENCOUNTER — Encounter: Payer: Self-pay | Admitting: Emergency Medicine

## 2017-05-27 ENCOUNTER — Encounter (HOSPITAL_COMMUNITY): Payer: Self-pay

## 2017-05-27 DIAGNOSIS — Z7984 Long term (current) use of oral hypoglycemic drugs: Secondary | ICD-10-CM

## 2017-05-27 DIAGNOSIS — N179 Acute kidney failure, unspecified: Secondary | ICD-10-CM | POA: Diagnosis present

## 2017-05-27 DIAGNOSIS — D473 Essential (hemorrhagic) thrombocythemia: Secondary | ICD-10-CM

## 2017-05-27 DIAGNOSIS — J189 Pneumonia, unspecified organism: Secondary | ICD-10-CM | POA: Diagnosis present

## 2017-05-27 DIAGNOSIS — E119 Type 2 diabetes mellitus without complications: Secondary | ICD-10-CM | POA: Diagnosis present

## 2017-05-27 DIAGNOSIS — M25511 Pain in right shoulder: Secondary | ICD-10-CM | POA: Diagnosis present

## 2017-05-27 DIAGNOSIS — I10 Essential (primary) hypertension: Secondary | ICD-10-CM | POA: Diagnosis not present

## 2017-05-27 DIAGNOSIS — S069X1S Unspecified intracranial injury with loss of consciousness of 30 minutes or less, sequela: Secondary | ICD-10-CM

## 2017-05-27 DIAGNOSIS — Z79899 Other long term (current) drug therapy: Secondary | ICD-10-CM | POA: Diagnosis not present

## 2017-05-27 DIAGNOSIS — R4189 Other symptoms and signs involving cognitive functions and awareness: Secondary | ICD-10-CM | POA: Diagnosis present

## 2017-05-27 DIAGNOSIS — S069X9S Unspecified intracranial injury with loss of consciousness of unspecified duration, sequela: Secondary | ICD-10-CM

## 2017-05-27 DIAGNOSIS — Z9081 Acquired absence of spleen: Secondary | ICD-10-CM | POA: Diagnosis not present

## 2017-05-27 DIAGNOSIS — R0989 Other specified symptoms and signs involving the circulatory and respiratory systems: Secondary | ICD-10-CM

## 2017-05-27 DIAGNOSIS — J90 Pleural effusion, not elsewhere classified: Secondary | ICD-10-CM | POA: Diagnosis not present

## 2017-05-27 DIAGNOSIS — D72828 Other elevated white blood cell count: Secondary | ICD-10-CM | POA: Diagnosis present

## 2017-05-27 DIAGNOSIS — J948 Other specified pleural conditions: Secondary | ICD-10-CM

## 2017-05-27 DIAGNOSIS — I169 Hypertensive crisis, unspecified: Secondary | ICD-10-CM | POA: Diagnosis present

## 2017-05-27 DIAGNOSIS — T1490XA Injury, unspecified, initial encounter: Secondary | ICD-10-CM | POA: Diagnosis present

## 2017-05-27 DIAGNOSIS — G4730 Sleep apnea, unspecified: Secondary | ICD-10-CM | POA: Diagnosis present

## 2017-05-27 DIAGNOSIS — I420 Dilated cardiomyopathy: Secondary | ICD-10-CM | POA: Diagnosis present

## 2017-05-27 DIAGNOSIS — N390 Urinary tract infection, site not specified: Secondary | ICD-10-CM

## 2017-05-27 DIAGNOSIS — I110 Hypertensive heart disease with heart failure: Secondary | ICD-10-CM | POA: Diagnosis present

## 2017-05-27 DIAGNOSIS — F05 Delirium due to known physiological condition: Secondary | ICD-10-CM | POA: Diagnosis present

## 2017-05-27 DIAGNOSIS — S2241XD Multiple fractures of ribs, right side, subsequent encounter for fracture with routine healing: Secondary | ICD-10-CM | POA: Diagnosis not present

## 2017-05-27 DIAGNOSIS — I5032 Chronic diastolic (congestive) heart failure: Secondary | ICD-10-CM | POA: Diagnosis present

## 2017-05-27 DIAGNOSIS — G479 Sleep disorder, unspecified: Secondary | ICD-10-CM

## 2017-05-27 DIAGNOSIS — R52 Pain, unspecified: Secondary | ICD-10-CM

## 2017-05-27 DIAGNOSIS — S3600XD Unspecified injury of spleen, subsequent encounter: Secondary | ICD-10-CM

## 2017-05-27 DIAGNOSIS — D72829 Elevated white blood cell count, unspecified: Secondary | ICD-10-CM

## 2017-05-27 DIAGNOSIS — D62 Acute posthemorrhagic anemia: Secondary | ICD-10-CM

## 2017-05-27 DIAGNOSIS — S069X9D Unspecified intracranial injury with loss of consciousness of unspecified duration, subsequent encounter: Secondary | ICD-10-CM | POA: Diagnosis not present

## 2017-05-27 DIAGNOSIS — Z09 Encounter for follow-up examination after completed treatment for conditions other than malignant neoplasm: Secondary | ICD-10-CM

## 2017-05-27 DIAGNOSIS — S069X9A Unspecified intracranial injury with loss of consciousness of unspecified duration, initial encounter: Secondary | ICD-10-CM

## 2017-05-27 DIAGNOSIS — S069XAA Unspecified intracranial injury with loss of consciousness status unknown, initial encounter: Secondary | ICD-10-CM

## 2017-05-27 DIAGNOSIS — D75839 Thrombocytosis, unspecified: Secondary | ICD-10-CM

## 2017-05-27 LAB — GLUCOSE, CAPILLARY
GLUCOSE-CAPILLARY: 118 mg/dL — AB (ref 65–99)
Glucose-Capillary: 101 mg/dL — ABNORMAL HIGH (ref 65–99)
Glucose-Capillary: 140 mg/dL — ABNORMAL HIGH (ref 65–99)
Glucose-Capillary: 61 mg/dL — ABNORMAL LOW (ref 65–99)

## 2017-05-27 MED ORDER — INSULIN ASPART 100 UNIT/ML ~~LOC~~ SOLN: 0.0000 [IU] | Freq: Every day | SUBCUTANEOUS | Status: DC

## 2017-05-27 MED ORDER — METOPROLOL TARTRATE 25 MG PO TABS
25.0000 mg | ORAL_TABLET | Freq: Two times a day (BID) | ORAL | Status: DC
  Administered 2017-05-27 – 2017-06-07 (×22): 25 mg via ORAL
  Filled 2017-05-27 (×22): qty 1

## 2017-05-27 MED ORDER — METHOCARBAMOL 500 MG PO TABS
500.0000 mg | ORAL_TABLET | Freq: Four times a day (QID) | ORAL | Status: DC | PRN
  Administered 2017-05-27 – 2017-06-07 (×10): 500 mg via ORAL
  Filled 2017-05-27 (×10): qty 1

## 2017-05-27 MED ORDER — INSULIN ASPART 100 UNIT/ML ~~LOC~~ SOLN
0.0000 [IU] | Freq: Three times a day (TID) | SUBCUTANEOUS | Status: DC
  Administered 2017-05-30 – 2017-05-31 (×3): 2 [IU] via SUBCUTANEOUS
  Administered 2017-05-31 – 2017-06-05 (×5): 1 [IU] via SUBCUTANEOUS

## 2017-05-27 MED ORDER — PREMIER PROTEIN SHAKE
11.0000 [oz_av] | Freq: Four times a day (QID) | ORAL | Status: DC
  Administered 2017-05-27 – 2017-05-29 (×5): 11 [oz_av] via ORAL
  Filled 2017-05-27 (×14): qty 325.31

## 2017-05-27 MED ORDER — TRAZODONE HCL 50 MG PO TABS: 25.0000 mg | ORAL_TABLET | Freq: Every evening | ORAL | Status: DC | PRN

## 2017-05-27 MED ORDER — OXYCODONE HCL 5 MG PO TABS
5.0000 mg | ORAL_TABLET | Freq: Four times a day (QID) | ORAL | Status: DC | PRN
  Administered 2017-05-28 – 2017-06-04 (×16): 5 mg via ORAL
  Filled 2017-05-27 (×16): qty 1

## 2017-05-27 MED ORDER — FLEET ENEMA 7-19 GM/118ML RE ENEM: 1.0000 | ENEMA | Freq: Once | RECTAL | Status: DC | PRN

## 2017-05-27 MED ORDER — ONDANSETRON 4 MG PO TBDP
4.0000 mg | ORAL_TABLET | Freq: Four times a day (QID) | ORAL | Status: DC | PRN
  Filled 2017-05-27: qty 1

## 2017-05-27 MED ORDER — ENOXAPARIN SODIUM 40 MG/0.4ML ~~LOC~~ SOLN
40.0000 mg | SUBCUTANEOUS | Status: DC
  Administered 2017-05-28 – 2017-06-07 (×11): 40 mg via SUBCUTANEOUS
  Filled 2017-05-27 (×11): qty 0.4

## 2017-05-27 MED ORDER — CLONAZEPAM 0.5 MG PO TABS
0.5000 mg | ORAL_TABLET | Freq: Two times a day (BID) | ORAL | Status: DC | PRN
  Administered 2017-05-28 – 2017-06-03 (×3): 0.5 mg via ORAL
  Filled 2017-05-27 (×3): qty 1

## 2017-05-27 MED ORDER — BISACODYL 10 MG RE SUPP: 10.0000 mg | Freq: Every day | RECTAL | Status: DC | PRN

## 2017-05-27 MED ORDER — ALUM & MAG HYDROXIDE-SIMETH 200-200-20 MG/5ML PO SUSP: 30.0000 mL | ORAL | Status: DC | PRN

## 2017-05-27 MED ORDER — TRAMADOL HCL 50 MG PO TABS
50.0000 mg | ORAL_TABLET | Freq: Four times a day (QID) | ORAL | Status: DC | PRN
  Administered 2017-05-28 – 2017-06-07 (×21): 50 mg via ORAL
  Filled 2017-05-27 (×21): qty 1

## 2017-05-27 MED ORDER — GUAIFENESIN-DM 100-10 MG/5ML PO SYRP: 5.0000 mL | ORAL_SOLUTION | Freq: Four times a day (QID) | ORAL | Status: DC | PRN

## 2017-05-27 MED ORDER — ACETAMINOPHEN 325 MG PO TABS
325.0000 mg | ORAL_TABLET | ORAL | Status: DC | PRN
  Administered 2017-05-27: 650 mg via ORAL
  Administered 2017-05-28 (×2): 325 mg via ORAL
  Administered 2017-05-29 – 2017-06-05 (×2): 650 mg via ORAL
  Filled 2017-05-27 (×2): qty 2
  Filled 2017-05-27: qty 1
  Filled 2017-05-27 (×2): qty 2

## 2017-05-27 MED ORDER — INSULIN GLARGINE 100 UNIT/ML ~~LOC~~ SOLN
10.0000 [IU] | Freq: Every day | SUBCUTANEOUS | Status: DC
  Administered 2017-05-27: 10 [IU] via SUBCUTANEOUS
  Filled 2017-05-27 (×2): qty 0.1

## 2017-05-27 MED ORDER — GLIPIZIDE ER 10 MG PO TB24
10.0000 mg | ORAL_TABLET | Freq: Every day | ORAL | Status: DC
  Administered 2017-05-28 – 2017-06-06 (×10): 10 mg via ORAL
  Filled 2017-05-27 (×11): qty 1

## 2017-05-27 MED ORDER — ATORVASTATIN CALCIUM 40 MG PO TABS
40.0000 mg | ORAL_TABLET | Freq: Every day | ORAL | Status: DC
  Administered 2017-05-28 – 2017-06-07 (×11): 40 mg via ORAL
  Filled 2017-05-27 (×11): qty 1

## 2017-05-27 MED ORDER — ONDANSETRON HCL 4 MG/2ML IJ SOLN: 4.0000 mg | Freq: Four times a day (QID) | INTRAMUSCULAR | Status: DC | PRN

## 2017-05-27 MED ORDER — MAGNESIUM HYDROXIDE 400 MG/5ML PO SUSP
30.0000 mL | Freq: Every day | ORAL | Status: DC | PRN
  Administered 2017-06-01: 30 mL via ORAL
  Filled 2017-05-27: qty 30

## 2017-05-27 MED ORDER — TAB-A-VITE/IRON PO TABS
1.0000 | ORAL_TABLET | Freq: Every day | ORAL | Status: DC
  Administered 2017-05-28 – 2017-06-07 (×11): 1 via ORAL
  Filled 2017-05-27 (×11): qty 1

## 2017-05-27 MED ORDER — ENOXAPARIN SODIUM 40 MG/0.4ML ~~LOC~~ SOLN: 40.0000 mg | SUBCUTANEOUS | Status: DC

## 2017-05-27 MED ORDER — DEXTROSE 50 % IV SOLN
INTRAVENOUS | Status: AC
  Administered 2017-05-27: 25 mL
  Filled 2017-05-27: qty 50

## 2017-05-27 MED ORDER — DIPHENHYDRAMINE HCL 12.5 MG/5ML PO ELIX: 12.5000 mg | ORAL_SOLUTION | Freq: Four times a day (QID) | ORAL | Status: DC | PRN

## 2017-05-27 MED ORDER — VITAMIN B-1 100 MG PO TABS
100.0000 mg | ORAL_TABLET | Freq: Every day | ORAL | Status: DC
  Administered 2017-05-28 – 2017-06-07 (×11): 100 mg via ORAL
  Filled 2017-05-27 (×11): qty 1

## 2017-05-27 MED ORDER — FOLIC ACID 1 MG PO TABS
1.0000 mg | ORAL_TABLET | Freq: Every day | ORAL | Status: DC
  Administered 2017-05-28 – 2017-06-07 (×11): 1 mg via ORAL
  Filled 2017-05-27 (×11): qty 1

## 2017-05-27 MED ORDER — PANTOPRAZOLE SODIUM 40 MG PO TBEC
40.0000 mg | DELAYED_RELEASE_TABLET | Freq: Every day | ORAL | Status: DC
  Administered 2017-05-28 – 2017-06-07 (×11): 40 mg via ORAL
  Filled 2017-05-27 (×11): qty 1

## 2017-05-27 MED ORDER — AMLODIPINE BESYLATE 5 MG PO TABS
5.0000 mg | ORAL_TABLET | Freq: Every day | ORAL | Status: DC
  Administered 2017-05-28 – 2017-05-29 (×2): 5 mg via ORAL
  Filled 2017-05-27 (×2): qty 1

## 2017-05-27 MED ORDER — POLYETHYLENE GLYCOL 3350 17 G PO PACK
17.0000 g | PACK | Freq: Every day | ORAL | Status: DC
  Administered 2017-05-28 – 2017-05-29 (×2): 17 g via ORAL
  Filled 2017-05-27 (×2): qty 1

## 2017-05-27 NOTE — Progress Notes (Signed)
Inpatient Rehabilitation  Received notification from acute team that patient ready for IP Rehab.  I have called son to further discuss rehab plans, but was unable to leave a message.  Will re-attempt in a little and update team as I know.  Call if questions.   Carmelia Roller., CCC/SLP Admission Coordinator  [place]  Cell [phone]

## 2017-05-27 NOTE — Progress Notes (Signed)
Received pt. As a new admitted.Pt. And his family were oriented to the unit routine and protocol.Safety plan was explained,fall prevention plan was explained and signed.
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
## 2017-05-27 NOTE — Progress Notes (Signed)
Patient discharged from 4NP11 and transferred to unit 4MW09 at this time. Alert and in stable condition. IV site d/c'd and report given to receiving nurse Maudry Mayhew, RN with all questions answered. Transported out of unit via bed with son and all belongings at side.
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
## 2017-05-27 NOTE — Progress Notes (Signed)
Inpatient Rehabilitation  Notified that patient is medically ready for IP Rehab today.  Discussed with patient, sisters at bedside, and son via phone.  All are in agreement with plan for IP Rehab and son is going to coordinate caregiver support for discharge.  Plan to admit patient today.  Call if questions.   Carmelia Roller., CCC/SLP Admission Coordinator  [place]  Cell [phone]

## 2017-05-27 NOTE — Progress Notes (Signed)
Hypoglycemic Event  CBG:61  Treatment: D50 IV 25 mL  Symptoms: None  Follow-up CBG: Time:1720 CBG Result:140  Possible Reasons for Event: Inadequate meal intake  Comments/MD notified:Hypoglycemic protocol.    Tom-Johnson, Renea Ee
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
## 2017-05-28 ENCOUNTER — Inpatient Hospital Stay (HOSPITAL_COMMUNITY): Payer: Medicare Other | Admitting: Occupational Therapy

## 2017-05-28 ENCOUNTER — Other Ambulatory Visit: Payer: Self-pay

## 2017-05-28 ENCOUNTER — Inpatient Hospital Stay (HOSPITAL_COMMUNITY): Payer: Medicare Other | Admitting: Speech Pathology

## 2017-05-28 ENCOUNTER — Inpatient Hospital Stay (HOSPITAL_COMMUNITY): Payer: Medicare Other

## 2017-05-28 ENCOUNTER — Inpatient Hospital Stay (HOSPITAL_COMMUNITY): Payer: Medicare Other | Admitting: Physical Therapy

## 2017-05-28 DIAGNOSIS — T1490XA Injury, unspecified, initial encounter: Secondary | ICD-10-CM

## 2017-05-28 DIAGNOSIS — I10 Essential (primary) hypertension: Secondary | ICD-10-CM | POA: Insufficient documentation

## 2017-05-28 DIAGNOSIS — D62 Acute posthemorrhagic anemia: Secondary | ICD-10-CM

## 2017-05-28 DIAGNOSIS — S069X9D Unspecified intracranial injury with loss of consciousness of unspecified duration, subsequent encounter: Principal | ICD-10-CM

## 2017-05-28 DIAGNOSIS — E119 Type 2 diabetes mellitus without complications: Secondary | ICD-10-CM

## 2017-05-28 DIAGNOSIS — D72829 Elevated white blood cell count, unspecified: Secondary | ICD-10-CM

## 2017-05-28 LAB — COMPREHENSIVE METABOLIC PANEL
ALBUMIN: 2 g/dL — AB (ref 3.5–5.0)
ALT: 28 U/L (ref 17–63)
AST: 38 U/L (ref 15–41)
Alkaline Phosphatase: 131 U/L — ABNORMAL HIGH (ref 38–126)
Anion gap: 9 (ref 5–15)
BUN: 8 mg/dL (ref 6–20)
CHLORIDE: 103 mmol/L (ref 101–111)
CO2: 23 mmol/L (ref 22–32)
Calcium: 7.9 mg/dL — ABNORMAL LOW (ref 8.9–10.3)
Creatinine, Ser: 1.07 mg/dL (ref 0.61–1.24)
GFR calc Af Amer: >60 mL/min (ref >60)
GFR calc non Af Amer: >60 mL/min (ref >60)
Glucose, Bld: 87 mg/dL (ref 65–99)
POTASSIUM: 4 mmol/L (ref 3.5–5.1)
Sodium: 135 mmol/L (ref 135–145)
Total Bilirubin: 0.2 mg/dL — ABNORMAL LOW (ref 0.3–1.2)
Total Protein: 6.7 g/dL (ref 6.5–8.1)

## 2017-05-28 LAB — CBC WITH DIFFERENTIAL/PLATELET
BASOS ABS: 0 10*3/uL (ref 0.0–0.1)
Basophils Relative: 0 %
Eosinophils Absolute: 0.3 10*3/uL (ref 0.0–0.7)
Eosinophils Relative: 2 %
HCT: 26 % — ABNORMAL LOW (ref 39.0–52.0)
Hemoglobin: 7.9 g/dL — ABNORMAL LOW (ref 13.0–17.0)
LYMPHS ABS: 2.1 10*3/uL (ref 0.7–4.0)
Lymphocytes Relative: 13 %
MCH: 24 pg — ABNORMAL LOW (ref 26.0–34.0)
MCHC: 30.4 g/dL (ref 30.0–36.0)
MCV: 79 fL (ref 78.0–100.0)
MONO ABS: 1.8 10*3/uL — AB (ref 0.1–1.0)
Monocytes Relative: 11 %
Neutro Abs: 12.2 10*3/uL — ABNORMAL HIGH (ref 1.7–7.7)
Neutrophils Relative %: 74 %
PLATELETS: 1126 10*3/uL — AB (ref 150–400)
RBC: 3.29 MIL/uL — AB (ref 4.22–5.81)
RDW: 20.8 % — AB (ref 11.5–15.5)
WBC: 16.4 10*3/uL — AB (ref 4.0–10.5)

## 2017-05-28 LAB — GLUCOSE, CAPILLARY: Glucose-Capillary: 111 mg/dL — ABNORMAL HIGH (ref 65–99)

## 2017-05-28 MED ORDER — INSULIN GLARGINE 100 UNIT/ML ~~LOC~~ SOLN
5.0000 [IU] | Freq: Every day | SUBCUTANEOUS | Status: DC
  Administered 2017-05-28 – 2017-05-29 (×2): 5 [IU] via SUBCUTANEOUS
  Filled 2017-05-28 (×2): qty 0.05

## 2017-05-28 MED ORDER — LIDOCAINE 5 % EX PTCH
1.0000 | MEDICATED_PATCH | CUTANEOUS | Status: DC
  Administered 2017-05-28 – 2017-06-05 (×9): 1 via TRANSDERMAL
  Filled 2017-05-28 (×10): qty 1

## 2017-05-28 MED ORDER — TRAZODONE HCL 50 MG PO TABS
50.0000 mg | ORAL_TABLET | Freq: Every day | ORAL | Status: DC
  Administered 2017-05-28 – 2017-06-06 (×10): 50 mg via ORAL
  Filled 2017-05-28 (×10): qty 1

## 2017-05-28 MED ORDER — ORAL CARE MOUTH RINSE
15.0000 mL | Freq: Two times a day (BID) | OROMUCOSAL | Status: DC
  Administered 2017-05-28 – 2017-06-07 (×17): 15 mL via OROMUCOSAL

## 2017-05-28 MED ORDER — PRO-STAT SUGAR FREE PO LIQD
30.0000 mL | Freq: Two times a day (BID) | ORAL | Status: DC
  Administered 2017-05-28 – 2017-06-03 (×13): 30 mL via ORAL
  Filled 2017-05-28 (×13): qty 30

## 2017-05-28 MED ORDER — CALCIUM CARBONATE-VITAMIN D 500-200 MG-UNIT PO TABS
1.0000 | ORAL_TABLET | Freq: Two times a day (BID) | ORAL | Status: DC
  Administered 2017-05-28 – 2017-06-07 (×21): 1 via ORAL
  Filled 2017-05-28 (×21): qty 1

## 2017-05-28 MED ORDER — CALCIUM CITRATE-VITAMIN D 500-500 MG-UNIT PO CHEW: 1.0000 | CHEWABLE_TABLET | Freq: Two times a day (BID) | ORAL | Status: DC
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
## 2017-05-28 NOTE — Significant Event (Signed)
Hypoglycemic Event  CBG: 59  Treatment: 15 GM carbohydrate snack  Symptoms: None  Follow-up CBG: Time:1745 CBG Result:103  Possible Reasons for Event: Inadequate meal intake  Comments/MD notified:Pam PA notified and aware.    Malik [name]
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
## 2017-05-28 NOTE — Progress Notes (Signed)
Patient information reviewed and entered into eRehab system by Cary Wilford, RN, CRRN, PPS Coordinator.  Information including medical coding and functional independence measure will be reviewed and updated through discharge.     Per nursing patient was given "Data Collection Information Summary for Patients in Inpatient Rehabilitation Facilities with attached "Privacy Act Statement-Health Care Records" upon admission.  
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
## 2017-05-28 NOTE — Significant Event (Signed)
CRITICAL VALUE ALERT  Critical Value: Platelet count >900   Date & Time Notied:  05/28/17 0815  Provider Notified: Algis Liming  Orders Received/Actions taken: States will continue to monitor. No new orders.

## 2017-05-28 NOTE — Significant Event (Signed)
Hypoglycemic Event  CBG: 61 at 1655   Treatment: 15 GM carbohydrate snack  Symptoms: None  Follow-up CBG: Time:1715 CBG Result:59  Possible Reasons for Event: Inadequate meal intake  Comments/MD notified:Pam PA notified. Requesting decrease in Lantus at bedtime to 5u.    Claude Manges

## 2017-05-29 ENCOUNTER — Inpatient Hospital Stay (HOSPITAL_COMMUNITY): Payer: Medicare Other | Admitting: Occupational Therapy

## 2017-05-29 ENCOUNTER — Inpatient Hospital Stay (HOSPITAL_COMMUNITY): Admitting: Physical Therapy

## 2017-05-29 ENCOUNTER — Inpatient Hospital Stay (HOSPITAL_COMMUNITY): Admitting: Speech Pathology

## 2017-05-29 ENCOUNTER — Encounter (HOSPITAL_COMMUNITY): Payer: Self-pay | Admitting: *Deleted

## 2017-05-29 ENCOUNTER — Inpatient Hospital Stay (HOSPITAL_COMMUNITY): Payer: Medicare Other

## 2017-05-29 DIAGNOSIS — J90 Pleural effusion, not elsewhere classified: Secondary | ICD-10-CM

## 2017-05-29 DIAGNOSIS — G479 Sleep disorder, unspecified: Secondary | ICD-10-CM

## 2017-05-29 DIAGNOSIS — I169 Hypertensive crisis, unspecified: Secondary | ICD-10-CM | POA: Insufficient documentation

## 2017-05-29 LAB — CBC WITH DIFFERENTIAL/PLATELET
BASOS ABS: 0.1 10*3/uL (ref 0.0–0.1)
Basophils Relative: 1 %
EOS ABS: 0.4 10*3/uL (ref 0.0–0.7)
Eosinophils Relative: 3 %
HCT: 27.7 % — ABNORMAL LOW (ref 39.0–52.0)
Hemoglobin: 8.3 g/dL — ABNORMAL LOW (ref 13.0–17.0)
LYMPHS ABS: 2 10*3/uL (ref 0.7–4.0)
Lymphocytes Relative: 15 %
MCH: 23.5 pg — ABNORMAL LOW (ref 26.0–34.0)
MCHC: 30 g/dL (ref 30.0–36.0)
MCV: 78.5 fL (ref 78.0–100.0)
MONO ABS: 1.1 10*3/uL — AB (ref 0.1–1.0)
Monocytes Relative: 8 %
NEUTROS PCT: 73 %
Neutro Abs: 9.8 10*3/uL — ABNORMAL HIGH (ref 1.7–7.7)
PLATELETS: 1234 10*3/uL — AB (ref 150–400)
RBC: 3.53 MIL/uL — AB (ref 4.22–5.81)
RDW: 20 % — AB (ref 11.5–15.5)
WBC: 13.4 10*3/uL — AB (ref 4.0–10.5)

## 2017-05-29 LAB — PATHOLOGIST SMEAR REVIEW

## 2017-05-29 LAB — GLUCOSE, CAPILLARY
GLUCOSE-CAPILLARY: 91 mg/dL (ref 65–99)
GLUCOSE-CAPILLARY: 143 mg/dL — AB (ref 65–99)

## 2017-05-29 MED ORDER — AMLODIPINE BESYLATE 5 MG PO TABS
5.0000 mg | ORAL_TABLET | Freq: Once | ORAL | Status: AC
  Administered 2017-05-29: 5 mg via ORAL
  Filled 2017-05-29: qty 1

## 2017-05-29 MED ORDER — FUROSEMIDE 20 MG PO TABS
20.0000 mg | ORAL_TABLET | Freq: Every day | ORAL | Status: DC
  Administered 2017-05-29 – 2017-06-07 (×10): 20 mg via ORAL
  Filled 2017-05-29 (×10): qty 1

## 2017-05-29 MED ORDER — OXYCODONE HCL 5 MG PO TABS: 10.0000 mg | ORAL_TABLET | Freq: Every day | ORAL | Status: DC

## 2017-05-29 MED ORDER — POLYETHYLENE GLYCOL 3350 17 G PO PACK
17.0000 g | PACK | Freq: Two times a day (BID) | ORAL | Status: DC
Start: 2017-05-29 — End: 2017-06-07
  Administered 2017-05-30 – 2017-06-07 (×14): 17 g via ORAL
  Filled 2017-05-29 (×14): qty 1

## 2017-05-29 MED ORDER — AMLODIPINE BESYLATE 10 MG PO TABS
10.0000 mg | ORAL_TABLET | Freq: Every day | ORAL | Status: DC
Start: 2017-05-30 — End: 2017-06-07
  Administered 2017-05-30 – 2017-06-07 (×9): 10 mg via ORAL
  Filled 2017-05-29 (×9): qty 1

## 2017-05-29 MED ORDER — OXYCODONE HCL ER 10 MG PO T12A
10.0000 mg | EXTENDED_RELEASE_TABLET | Freq: Two times a day (BID) | ORAL | Status: DC
  Administered 2017-05-29 – 2017-06-02 (×9): 10 mg via ORAL
  Filled 2017-05-29 (×9): qty 1

## 2017-05-29 MED ORDER — LIDOCAINE 5 % EX PTCH: 1.0000 | MEDICATED_PATCH | CUTANEOUS | Status: DC

## 2017-05-29 NOTE — Progress Notes (Signed)
Occupational Therapy Session Note  Patient Details  Name: Malik [name] MRN: [id number] Date of Birth: Dec 08, 1945  Today's Date: 05/29/2017 OT Individual Time: 2130-8657 OT Individual Time Calculation (min): 42 min  and Today's Date: 05/29/2017 OT Missed Time: 18 Minutes Missed Time Reason: Patient fatigue;Pain   Short Term Goals: Week 1:  OT Short Term Goal 1 (Week 1): Pt will bathe at shower level with min guard OT Short Term Goal 2 (Week 1): Pt will transfer to toilet with supervision  OT Short Term Goal 3 (Week 1): Pt will perform standing task with supervison for ~5 min before rest break  Skilled Therapeutic Interventions/Progress Updates:    Upon entering the room, pt supine sleeping with no c/o pain this session. Pt required coaxing for participation but shower used as motivation. Pt performed supine >sit with mod A to EOB. Pt ambulating into bathroom with RW and steady assistance while standing over toilet to urinate with steady assistance. Pt transferred into shower with steady assistance and engaged in bathing at shower level with overall min A for balance when standing to wash buttocks and peri area. Pt returning to bed to don clothing and reports, " I just can't do it. I'm too tired." Pt attempting to lay down several times and therefore required total A for UB and LB dressing as pt did not assist at all. Pt returning to supine position at end of session. Pt reports increased pain but does not report where. RN arrived with medications. Pt declined further OT intervention secondary to fatigue.   Therapy Documentation Precautions:  Precautions Precautions: Fall Restrictions Weight Bearing Restrictions: No General: General OT Amount of Missed Time: 18 Minutes Vital Signs: Therapy Vitals Temp: 98.8 F (37.1 C) Temp Source: Oral Pulse Rate: 87 Resp: 18 BP: (!) 153/77 Patient Position (if appropriate): Lying Oxygen Therapy SpO2: 91 % O2 Device: Room Air Pain: Pain  Assessment Pain Scale: 0-10 Pain Score: 2  Pain Type: Acute pain Pain Location: Flank Pain Orientation: Left Pain Descriptors / Indicators: Aching Pain Intervention(s): RN made aware ADL: ADL ADL Comments: see functional navigator Vision   Perception    Praxis   Exercises:   Other Treatments:    See Function Navigator for Current Functional Status.   Therapy/Group: Individual Therapy  Malik [name] 05/29/2017, 3:17 PM

## 2017-05-29 NOTE — Progress Notes (Addendum)
Physical Therapy Session Note  Patient Details  Name: Malik [name] MRN: [id number] Date of Birth: Nov 20, 1945  Today's Date: 05/29/2017 PT Individual Time: 0905-1005 AND 8270-7867 PT Individual Time Calculation (min): 60 min AND 25 min   Short Term Goals: Week 1:  PT Short Term Goal 1 (Week 1): Pt will perform all bed mobility with mod assist consistently PT Short Term Goal 2 (Week 1): Pt will perform stand pivot tranfers to WC with min assist and LRAD  PT Short Term Goal 3 (Week 1): Pt will ambulate 145f with min assist and LRAD  PT Short Term Goal 4 (Week 1): Pt will propell WC 1031fwith supervision assist  PT Short Term Goal 5 (Week 1): Pt will initate stair training.   Skilled Therapeutic Interventions/Progress Updates:    Pt received supine in bed and agreeable to PT.   PT supplied pt with reclining back WC to reduce pressure on lateral and posterior flank.   Supine>sit transfer with mod assist and min cues for use of bed feautues; pt request to pull on PT rather than bed rails.   Stand pivot to [facility] LLC[facility] min assist and RW. Min cues for safety and proper use of AD and UE placement.    PT instructed pt in car transfer with RW and min assist. Min cues from PT for safety and technique.   Stair negotiaiton training instructed by PT x 4 steps with BUE support on rails. Pt noted to self select step through gait pattern on ascend and step to on descent. Min cues for posture and gait pattern to improve safety.    Gait training with RW x 1204fith min assist and min cues for AD management in turns. Shuffle gait pattern with wide BOS due to pain.   Pt instructed in endurance training on nustep 4 min x 2 on level 4. Cues for speed and pursed lip breathing.   SpO2 monitored throughout treatment, 89-92% following activity; 94% at rest.   Session 2.   Pt received supine in bed and agreeable to PT. Supine>sit transfer with min-mod assist and min cues for safety.   PT instructed pt  in gait training with RW 130f23fth min assist. Min cues for Ad management in turns. Pt returned to room and reports need to urinate. Pt discarded RW against adivice from PT and ambulated through room with UE support on walls and bed to bathroom with min assist. Min assist for balance while standing for urination.  Pt returned to bed with ambulatory transfer with min assist, no AD. Sit>supine completed with min assist and left supine in bed with call bell in reach and all needs met.              Therapy Documentation Precautions:  Precautions Precautions: Fall Restrictions Weight Bearing Restrictions: No Pain: Pain Assessment Pain Scale: 0-10 Pain Score: 8  Pain Type: Acute pain Pain Location: Flank Pain Orientation: Left Pain Descriptors / Indicators: Aching Pain Intervention(s): RN made aware  See Function Navigator for Current Functional Status.   Therapy/Group: Individual Therapy  AustLorie Phenix1/2019, 12:44 PM

## 2017-05-29 NOTE — Progress Notes (Signed)
Discussed CXR with Dr. Hulen Skains who felt that fluid collecion was hydrothorax and he recommended ultrasound guided thoracocentesis. Radiology consulted.

## 2017-05-29 NOTE — Plan of Care (Signed)
  Problem: Consults Goal: RH GENERAL PATIENT EDUCATION Description See Patient Education module for education specifics. Outcome: Not Progressing Goal: Diabetes Guidelines if Diabetic/Glucose > 140 Description If diabetic or lab glucose is > 140 mg/dl - Initiate Diabetes/Hyperglycemia Guidelines & Document Interventions  Outcome: Not Progressing   Problem: RH BOWEL ELIMINATION Goal: RH STG MANAGE BOWEL WITH ASSISTANCE Description STG Manage Bowel with min  Assistance.  Outcome: Not Progressing   Problem: RH BLADDER ELIMINATION Goal: RH STG MANAGE BLADDER WITH ASSISTANCE Description STG Manage Bladder With min Assistance  Outcome: Not Progressing   Problem: RH SKIN INTEGRITY Goal: RH STG SKIN FREE OF INFECTION/BREAKDOWN Outcome: Not Progressing Goal: RH STG MAINTAIN SKIN INTEGRITY WITH ASSISTANCE Description STG Maintain Skin Integrity With min Assistance.  Outcome: Not Progressing   Problem: RH SAFETY Goal: RH STG ADHERE TO SAFETY PRECAUTIONS W/ASSISTANCE/DEVICE Description STG Adhere to Safety Precautions With Assistance/Device. Outcome: Not Progressing Goal: RH OTHER STG SAFETY GOALS W/ASSIST Description Other STG Safety Goals With Assistance. Outcome: Not Progressing  Total assist at this time
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
## 2017-05-29 NOTE — Plan of Care (Deleted)
  Problem: Consults Goal: [facility] GENERAL PATIENT EDUCATION Description See Patient Education module for education specifics. 05/29/2017 0652 by Etheleen Nicks, RN Outcome: Not Progressing 05/29/2017 0452 by Etheleen Nicks, RN Outcome: Not Progressing Goal: Diabetes Guidelines if Diabetic/Glucose > 140 Description If diabetic or lab glucose is > 140 mg/dl - Initiate Diabetes/Hyperglycemia Guidelines & Document Interventions  05/29/2017 0652 by Etheleen Nicks, RN Outcome: Not Progressing 05/29/2017 0452 by Etheleen Nicks, RN Outcome: Not Progressing   Pt is total assist for all ADLs

## 2017-05-30 ENCOUNTER — Inpatient Hospital Stay (HOSPITAL_COMMUNITY): Admitting: Physical Therapy

## 2017-05-30 ENCOUNTER — Inpatient Hospital Stay (HOSPITAL_COMMUNITY): Payer: Medicare Other | Admitting: Occupational Therapy

## 2017-05-30 ENCOUNTER — Inpatient Hospital Stay (HOSPITAL_COMMUNITY): Admitting: Speech Pathology

## 2017-05-30 ENCOUNTER — Encounter (HOSPITAL_COMMUNITY): Payer: Self-pay | Admitting: Radiology

## 2017-05-30 ENCOUNTER — Inpatient Hospital Stay (HOSPITAL_COMMUNITY): Payer: Medicare Other

## 2017-05-30 DIAGNOSIS — S069X9S Unspecified intracranial injury with loss of consciousness of unspecified duration, sequela: Secondary | ICD-10-CM

## 2017-05-30 DIAGNOSIS — J948 Other specified pleural conditions: Secondary | ICD-10-CM | POA: Insufficient documentation

## 2017-05-30 LAB — BASIC METABOLIC PANEL
ANION GAP: 8 (ref 5–15)
BUN: 14 mg/dL (ref 6–20)
CALCIUM: 8.2 mg/dL — AB (ref 8.9–10.3)
CO2: 25 mmol/L (ref 22–32)
Chloride: 103 mmol/L (ref 101–111)
Creatinine, Ser: 1.08 mg/dL (ref 0.61–1.24)
GFR calc Af Amer: >60 mL/min (ref >60)
GFR calc non Af Amer: >60 mL/min (ref >60)
GLUCOSE: 139 mg/dL — AB (ref 65–99)
Potassium: 3.9 mmol/L (ref 3.5–5.1)
Sodium: 136 mmol/L (ref 135–145)

## 2017-05-30 LAB — CBC WITH DIFFERENTIAL/PLATELET
BASOS PCT: 0 %
Basophils Absolute: 0 10*3/uL (ref 0.0–0.1)
EOS PCT: 3 %
Eosinophils Absolute: 0.4 10*3/uL (ref 0.0–0.7)
HEMATOCRIT: 26.4 % — AB (ref 39.0–52.0)
Hemoglobin: 7.9 g/dL — ABNORMAL LOW (ref 13.0–17.0)
LYMPHS ABS: 2.3 10*3/uL (ref 0.7–4.0)
Lymphocytes Relative: 16 %
MCH: 23.4 pg — AB (ref 26.0–34.0)
MCHC: 29.9 g/dL — AB (ref 30.0–36.0)
MCV: 78.3 fL (ref 78.0–100.0)
MONO ABS: 1.1 10*3/uL — AB (ref 0.1–1.0)
Monocytes Relative: 8 %
NEUTROS ABS: 10.3 10*3/uL — AB (ref 1.7–7.7)
Neutrophils Relative %: 73 %
Platelets: 1261 10*3/uL (ref 150–400)
RBC: 3.37 MIL/uL — ABNORMAL LOW (ref 4.22–5.81)
RDW: 20 % — AB (ref 11.5–15.5)
WBC: 14.1 10*3/uL — ABNORMAL HIGH (ref 4.0–10.5)

## 2017-05-30 LAB — GLUCOSE, CAPILLARY
GLUCOSE-CAPILLARY: 97 mg/dL (ref 65–99)
Glucose-Capillary: 123 mg/dL — ABNORMAL HIGH (ref 65–99)
Glucose-Capillary: 114 mg/dL — ABNORMAL HIGH (ref 65–99)
Glucose-Capillary: 191 mg/dL — ABNORMAL HIGH (ref 65–99)

## 2017-05-30 MED ORDER — LIDOCAINE HCL (PF) 2 % IJ SOLN
INTRAMUSCULAR | Status: AC
  Filled 2017-05-30: qty 20

## 2017-05-30 MED ORDER — INSULIN GLARGINE 100 UNIT/ML ~~LOC~~ SOLN
5.0000 [IU] | Freq: Every day | SUBCUTANEOUS | Status: DC
  Administered 2017-05-30 – 2017-06-03 (×5): 5 [IU] via SUBCUTANEOUS
  Filled 2017-05-30 (×6): qty 0.05

## 2017-05-30 MED ORDER — METFORMIN HCL 500 MG PO TABS: 1000.0000 mg | ORAL_TABLET | Freq: Two times a day (BID) | ORAL | Status: DC
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
## 2017-05-30 NOTE — Progress Notes (Signed)
Discussed patient's results with Dr. Alen Blew. 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 To continue DVT prophylaxis and that platelets should start normalizing in a few weeks.

## 2017-05-30 NOTE — Progress Notes (Signed)
Social Work Patient ID: Malik [name], male   DOB: [birth year], 72 y.o.   MRN: [id number]   Pt and son aware of targeted d/c date of 4/2 and supervision to min assist goals.  Son reports that he does not yet have a confirmed d/c plan for 24/7 coverage "but I"m working on it..."  Will stay in touch with him and assist with planning as needed.  Aaliyana Fredericks, LCSW
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
## 2017-05-30 NOTE — Progress Notes (Signed)
Patient brought to radiology department for thoracentesis.  Patient unable to demonstrate orientation x4.  He cannot consent for procedure at this time.  Spoke with RN who states patient just received pain medication.  Attempted to call son at [phone] [phone] with no answer.   Patient returned to unit.  Not currently in respiratory distress.  Will continue to work towards procedure once able to obtain consent.   Brynda Greathouse, MS RD PA-C 12:47 PM

## 2017-05-30 NOTE — Progress Notes (Signed)
Physical Therapy Session Note  Patient Details  Name: Malik [name] MRN: [id number] Date of Birth: 05-20-1945  Today's Date: 05/30/2017 PT Individual Time: 1325-1430 PT Individual Time Calculation (min): 65 min   Short Term Goals: Week 1:  PT Short Term Goal 1 (Week 1): Pt will perform all bed mobility with mod assist consistently PT Short Term Goal 2 (Week 1): Pt will perform stand pivot tranfers to WC with min assist and LRAD  PT Short Term Goal 3 (Week 1): Pt will ambulate 129f with min assist and LRAD  PT Short Term Goal 4 (Week 1): Pt will propell WC 1013fwith supervision assist  PT Short Term Goal 5 (Week 1): Pt will initate stair training.   Skilled Therapeutic Interventions/Progress Updates:   Pt received supine in bed and agreeable to PT. Supine>sit transfer with min assist and cues for safety awareness.   Gait training with RW 2x [id number] assist from PT. PT assessed Vitals following second bout of gait training due to increased labored breathing. SpO2 87%. <30seconds of rest increase to greater than 90%  Nustep endurance training x 8 minutes with continuous pulse oxicemetry. Pt noted to desat to 86% twice, but able to improve to >90% with short rest breaks.    PT instructed pt in standing balance to perform reaching task to the R and the L x 10 Bil. No AD. Pt required min assist to prevent anterior and Posterior LOB. Moderate cues for use of hip strategy to improve COM control.   All sit<>stand and stand pivot transfers completed with min assist and mi nces for proper use of AD and improved safety.   Pt returned to room and performed stand pivot transfer to bed with min assist. Sit>supine completed with supervision assist, and pt left supine in bed with call bell in reach and all needs met.       Therapy Documentation Precautions:  Precautions Precautions: Fall Restrictions Weight Bearing Restrictions: No Vital Signs: Therapy Vitals Temp: 97.6 F (36.4  C) Temp Source: Oral Pulse Rate: 88 Resp: 18 BP: (!) 142/66 Patient Position (if appropriate): Lying Oxygen Therapy SpO2: 90 % O2 Device: Room Air Pain: Pain Assessment Pain Scale: 0-10 Pain Score: 8  Pain Type: Acute pain Pain Location: Abdomen Pain Orientation: Left Pain Descriptors / Indicators: Aching Pain Onset: On-going Pain Intervention(s): Repositioned;Medication (See eMAR);Rest  See Function Navigator for Current Functional Status.   Therapy/Group: Individual Therapy  AusLorie Phenix22/2019, 5:09 PM

## 2017-05-30 NOTE — Progress Notes (Signed)
Occupational Therapy Session Note  Patient Details  Name: Malik [name] MRN: [id number] Date of Birth: 29-Jul-1945  Today's Date: 05/30/2017 OT Individual Time: 5374-8270 OT Individual Time Calculation (min): 20 min  and Today's Date: 05/30/2017 OT Missed Time: 40 Minutes Missed Time Reason: Patient fatigue;Pain   Short Term Goals: Week 1:  OT Short Term Goal 1 (Week 1): Pt will bathe at shower level with min guard OT Short Term Goal 2 (Week 1): Pt will transfer to toilet with supervision  OT Short Term Goal 3 (Week 1): Pt will perform standing task with supervison for ~5 min before rest break  Skilled Therapeutic Interventions/Progress Updates:    Attempted to engage pt in ADL retraining or other purposeful activity, however pt reporting increased pain in abdomen and not having slept well overnight.  Attempted to redirect pt from pain, educating on purpose of therapy - however pt continuing to perseverate on pain in abdomen and fatigue.  Pt set up for breakfast and encouraged to attempt to eat something so he would have energy to engage in therapy sessions.  Pt missed 40 mins skilled treatment session.  Will continue to follow as able.  Therapy Documentation Precautions:  Precautions Precautions: Fall Restrictions Weight Bearing Restrictions: No General: General OT Amount of Missed Time: 40 Minutes Pain:  Pt with c/o pain in abdomen, not rated. RN aware.  See Function Navigator for Current Functional Status.   Therapy/Group: Individual Therapy  [name] 05/30/2017, 9:50 AM

## 2017-05-31 ENCOUNTER — Inpatient Hospital Stay (HOSPITAL_COMMUNITY): Admitting: Occupational Therapy

## 2017-05-31 ENCOUNTER — Inpatient Hospital Stay (HOSPITAL_COMMUNITY): Admitting: Physical Therapy

## 2017-05-31 ENCOUNTER — Inpatient Hospital Stay (HOSPITAL_COMMUNITY)

## 2017-05-31 ENCOUNTER — Inpatient Hospital Stay (HOSPITAL_COMMUNITY): Payer: Medicare Other

## 2017-05-31 LAB — CBC WITH DIFFERENTIAL/PLATELET
Basophils Absolute: 0.1 10*3/uL (ref 0.0–0.1)
Basophils Relative: 1 %
EOS PCT: 3 %
Eosinophils Absolute: 0.4 10*3/uL (ref 0.0–0.7)
HEMATOCRIT: 29 % — AB (ref 39.0–52.0)
Hemoglobin: 8.7 g/dL — ABNORMAL LOW (ref 13.0–17.0)
Lymphocytes Relative: 17 %
Lymphs Abs: 2.1 10*3/uL (ref 0.7–4.0)
MCH: 23.8 pg — ABNORMAL LOW (ref 26.0–34.0)
MCHC: 30 g/dL (ref 30.0–36.0)
MCV: 79.2 fL (ref 78.0–100.0)
MONO ABS: 1 10*3/uL (ref 0.1–1.0)
MONOS PCT: 8 %
NEUTROS PCT: 71 %
Neutro Abs: 8.9 10*3/uL — ABNORMAL HIGH (ref 1.7–7.7)
PLATELETS: 1118 10*3/uL — AB (ref 150–400)
RBC: 3.66 MIL/uL — AB (ref 4.22–5.81)
RDW: 20.4 % — ABNORMAL HIGH (ref 11.5–15.5)
WBC: 12.5 10*3/uL — AB (ref 4.0–10.5)

## 2017-05-31 LAB — URINALYSIS, ROUTINE W REFLEX MICROSCOPIC
Bilirubin Urine: NEGATIVE
Glucose, UA: NEGATIVE mg/dL
HGB URINE DIPSTICK: NEGATIVE
Ketones, ur: NEGATIVE mg/dL
NITRITE: POSITIVE — AB
PROTEIN: NEGATIVE mg/dL
Specific Gravity, Urine: 1.016 (ref 1.005–1.030)
pH: 7 (ref 5.0–8.0)

## 2017-05-31 LAB — GLUCOSE, CAPILLARY
GLUCOSE-CAPILLARY: 171 mg/dL — AB (ref 65–99)
GLUCOSE-CAPILLARY: 120 mg/dL — AB (ref 65–99)
Glucose-Capillary: 130 mg/dL — ABNORMAL HIGH (ref 65–99)
Glucose-Capillary: 163 mg/dL — ABNORMAL HIGH (ref 65–99)
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
## 2017-05-31 NOTE — Progress Notes (Signed)
  I was present for limited US of the chest to evaluate for pleural fluid.  There was only a minimal amount of pleural fluid present.   There was window to allow for safe approach for thoracentesis.  Judie Grieve BLAIR PA-C 05/31/2017 12:56 PM

## 2017-05-31 NOTE — Progress Notes (Signed)
Son in last night to visit with patient. Consent obtained for thoracentesis. Son's telephone number updated. Will continue to monitor.

## 2017-05-31 NOTE — Plan of Care (Signed)
  Problem: Consults Goal: RH GENERAL PATIENT EDUCATION Description See Patient Education module for education specifics. Outcome: Progressing Goal: Diabetes Guidelines if Diabetic/Glucose > 140 Description If diabetic or lab glucose is > 140 mg/dl - Initiate Diabetes/Hyperglycemia Guidelines & Document Interventions  Outcome: Progressing   Problem: RH BOWEL ELIMINATION Goal: RH STG MANAGE BOWEL WITH ASSISTANCE Description STG Manage Bowel with min  Assistance.  Outcome: Progressing   Problem: RH SKIN INTEGRITY Goal: RH STG SKIN FREE OF INFECTION/BREAKDOWN Outcome: Progressing Goal: RH STG MAINTAIN SKIN INTEGRITY WITH ASSISTANCE Description STG Maintain Skin Integrity With min Assistance.  Outcome: Progressing   Problem: RH SAFETY Goal: RH STG ADHERE TO SAFETY PRECAUTIONS W/ASSISTANCE/DEVICE Description STG Adhere to Safety Precautions With min Assistance/Device.  Outcome: Progressing Goal: RH OTHER STG SAFETY GOALS W/ASSIST Description Other STG Safety Goals With min Assistance.  Outcome: Progressing   Problem: RH BLADDER ELIMINATION Goal: RH STG MANAGE BLADDER WITH ASSISTANCE Description STG Manage Bladder With min Assistance  Outcome: Not Progressing
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
## 2017-05-31 NOTE — Progress Notes (Signed)
Physical Therapy Session Note  Patient Details  Name: Malik [name] MRN: [id number] Date of Birth: [birth year]  Today's Date: 05/31/2017 PT Individual Time: 1435-1515 PT Individual Time Calculation (min): 40 min   Short Term Goals: Week 1:  PT Short Term Goal 1 (Week 1): Pt will perform all bed mobility with mod assist consistently PT Short Term Goal 2 (Week 1): Pt will perform stand pivot tranfers to WC with min assist and LRAD  PT Short Term Goal 3 (Week 1): Pt will ambulate 155f with min assist and LRAD  PT Short Term Goal 4 (Week 1): Pt will propell WC 1045fwith supervision assist  PT Short Term Goal 5 (Week 1): Pt will initate stair training.   Skilled Therapeutic Interventions/Progress Updates:   Pt received supine in bed and agreeable to PT. Supine>sit transfer with min assist to pull pt to sitting. PT instructed pt in use of bed rails, but refused to sit up without PT hand to pull up from.   Bathroom transfer without AD and min assist while furniture walking. Supervision assist for balance while urinating. Moderate cues for safety in navigating DME in bathroom.    Gait training with RW 15075f 2 min assist from PT with cues for safety and AD management in turns. Pt noted to have mild improvement in use of RW in turns, remaining inside walker through all turns on this day.   Gait training without AD x [id number] min-mod assist from PT. Constant L lateral lob when ambulating without AD, unable to correct without assist from PT. Following gait without AD, Pt reports significant increase in pain and request to return to room.   Pt returned to room and performed ambulatory transfer to bed with . Sit>supine completed with supervision assist. Pt left supine in bed with call bell in reach and all needs met, including notifying RN of current pain and request for pain medication.      Therapy Documentation Precautions:  Precautions Precautions: Fall Restrictions Weight Bearing  Restrictions: No General:   Vital Signs: Therapy Vitals Temp: 98.2 F (36.8 C) Temp Source: Oral Pulse Rate: 78 Resp: 18 BP: (!) 157/68 Patient Position (if appropriate): Lying Oxygen Therapy SpO2: 93 % O2 Device: Room Air Pain: Pain Assessment Pain Scale: 0-10 Pain Score: 8 Pain Type: Acute pain Pain Location: Rib cage Pain Orientation: Left Pain Descriptors / Indicators: Aching Pain Onset: On-going Pain Intervention(s): Medication (See eMAR);Heat applied   See Function Navigator for Current Functional Status.   Therapy/Group: Individual Therapy  AustLorie Phenix3/2019, 4:34 PM

## 2017-06-01 ENCOUNTER — Inpatient Hospital Stay (HOSPITAL_COMMUNITY): Admitting: Occupational Therapy

## 2017-06-01 ENCOUNTER — Inpatient Hospital Stay (HOSPITAL_COMMUNITY)

## 2017-06-01 LAB — CBC WITH DIFFERENTIAL/PLATELET
BASOS PCT: 0 %
Basophils Absolute: 0 10*3/uL (ref 0.0–0.1)
Eosinophils Absolute: 0.5 10*3/uL (ref 0.0–0.7)
Eosinophils Relative: 4 %
HEMATOCRIT: 26.1 % — AB (ref 39.0–52.0)
Hemoglobin: 7.6 g/dL — ABNORMAL LOW (ref 13.0–17.0)
LYMPHS PCT: 17 %
Lymphs Abs: 1.9 10*3/uL (ref 0.7–4.0)
MCH: 23 pg — ABNORMAL LOW (ref 26.0–34.0)
MCHC: 29.1 g/dL — ABNORMAL LOW (ref 30.0–36.0)
MCV: 79.1 fL (ref 78.0–100.0)
Monocytes Absolute: 1.6 10*3/uL — ABNORMAL HIGH (ref 0.1–1.0)
Monocytes Relative: 14 %
NEUTROS PCT: 65 %
Neutro Abs: 7.3 10*3/uL (ref 1.7–7.7)
Platelets: 1099 10*3/uL (ref 150–400)
RBC: 3.3 MIL/uL — ABNORMAL LOW (ref 4.22–5.81)
RDW: 20 % — ABNORMAL HIGH (ref 11.5–15.5)
WBC: 11.3 10*3/uL — ABNORMAL HIGH (ref 4.0–10.5)

## 2017-06-01 LAB — GLUCOSE, CAPILLARY
Glucose-Capillary: 85 mg/dL (ref 65–99)
Glucose-Capillary: 145 mg/dL — ABNORMAL HIGH (ref 65–99)
Glucose-Capillary: 77 mg/dL (ref 65–99)
Glucose-Capillary: 149 mg/dL — ABNORMAL HIGH (ref 65–99)

## 2017-06-01 LAB — OCCULT BLOOD X 1 CARD TO LAB, STOOL: Fecal Occult Bld: NEGATIVE
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
## 2017-06-01 NOTE — Progress Notes (Addendum)
Physical Therapy Note  Patient Details  Name: Malik [name] MRN: [id number] Date of Birth: [birth year] Today's Date: 06/01/2017  0930-1030, 60 min individual tx Pain: pt c/o middle of back and L ribs; unable to rate   Bed mobility using rail and HOB raised, with supervision and exta time.  Stand pivot bed> w/c with supervision.    Activity tolerance activity in sitting, using Kinetron with bil LEs x 5 minutes at level 40 cm/sec. Standing exs with bil UE support: bil heel/toe raises, mini squats, 10 x 1.  Gait training with RW over level tile x 150' with min guard > supervision.  Gait training without AD x 150' with min guard except for 1 LOB requiring mod assist as pt had LOB/leaned against furniture to his R.  Pt consistently forgets to reach back when sitting down, despite max cues.  Pt left resting in w/c with quick release belt applied and all needs within reach. Pt called RN for pain meds.  See function navigator for current status. Vernal Rutan 06/01/2017, 8:25 AM
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
## 2017-06-02 ENCOUNTER — Inpatient Hospital Stay (HOSPITAL_COMMUNITY): Payer: Medicare Other | Admitting: Occupational Therapy

## 2017-06-02 ENCOUNTER — Inpatient Hospital Stay (HOSPITAL_COMMUNITY): Payer: Medicare Other

## 2017-06-02 ENCOUNTER — Inpatient Hospital Stay (HOSPITAL_COMMUNITY): Admitting: Physical Therapy

## 2017-06-02 ENCOUNTER — Inpatient Hospital Stay (HOSPITAL_COMMUNITY): Admitting: Speech Pathology

## 2017-06-02 DIAGNOSIS — R0989 Other specified symptoms and signs involving the circulatory and respiratory systems: Secondary | ICD-10-CM

## 2017-06-02 LAB — CBC WITH DIFFERENTIAL/PLATELET
Basophils Absolute: 0 10*3/uL (ref 0.0–0.1)
Basophils Relative: 0 %
Eosinophils Absolute: 0.6 10*3/uL (ref 0.0–0.7)
Eosinophils Relative: 5 %
HEMATOCRIT: 26.1 % — AB (ref 39.0–52.0)
HEMOGLOBIN: 7.7 g/dL — AB (ref 13.0–17.0)
LYMPHS ABS: 2 10*3/uL (ref 0.7–4.0)
Lymphocytes Relative: 16 %
MCH: 23.2 pg — AB (ref 26.0–34.0)
MCHC: 29.5 g/dL — AB (ref 30.0–36.0)
MCV: 78.6 fL (ref 78.0–100.0)
MONOS PCT: 12 %
Monocytes Absolute: 1.5 10*3/uL — ABNORMAL HIGH (ref 0.1–1.0)
NEUTROS ABS: 8.3 10*3/uL — AB (ref 1.7–7.7)
NEUTROS PCT: 67 %
Platelets: 1058 10*3/uL (ref 150–400)
RBC: 3.32 MIL/uL — ABNORMAL LOW (ref 4.22–5.81)
RDW: 20 % — ABNORMAL HIGH (ref 11.5–15.5)
WBC: 12.5 10*3/uL — ABNORMAL HIGH (ref 4.0–10.5)

## 2017-06-02 LAB — GLUCOSE, CAPILLARY
Glucose-Capillary: 88 mg/dL (ref 65–99)
Glucose-Capillary: 132 mg/dL — ABNORMAL HIGH (ref 65–99)
Glucose-Capillary: 72 mg/dL (ref 65–99)
Glucose-Capillary: 146 mg/dL — ABNORMAL HIGH (ref 65–99)

## 2017-06-02 MED ORDER — CIPROFLOXACIN HCL 500 MG PO TABS
500.0000 mg | ORAL_TABLET | Freq: Two times a day (BID) | ORAL | Status: DC
Start: 2017-06-02 — End: 2017-06-04
  Administered 2017-06-02 – 2017-06-04 (×5): 500 mg via ORAL
  Filled 2017-06-02 (×5): qty 1
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
## 2017-06-02 NOTE — Progress Notes (Signed)
Physical Therapy Session Note  Patient Details  Name: Malik [name] MRN: [id number] Date of Birth: [birth year]  Today's Date: 06/02/2017 PT Individual Time: 1017-1130 PT Individual Time Calculation (min): 73 min   Short Term Goals: Week 1:  PT Short Term Goal 1 (Week 1): Pt will perform all bed mobility with mod assist consistently PT Short Term Goal 2 (Week 1): Pt will perform stand pivot tranfers to WC with min assist and LRAD  PT Short Term Goal 3 (Week 1): Pt will ambulate 18f with min assist and LRAD  PT Short Term Goal 4 (Week 1): Pt will propell WC 1076fwith supervision assist  PT Short Term Goal 5 (Week 1): Pt will initate stair training.    Skilled Therapeutic Interventions/Progress Updates: Pt presented in bed agreeable to therapy. Pt c/o flank pain 8/10, pt attributing flank pain to "these beds". Per pt did not want to request additional pain meds if available at this time. Pt attempted supine to sit with use of features and pt disregarding cues for use of hand rails. PTA threaded pants total A and pt performed sit to stand from EOB min guard and was able to pull up pants with supervision. Pt ambulated to rehab gym with RW min guard and participated in standing balance on Airex with pt initially requiring minA to maintain standing fading to min guard with no AD. Participated in standing horse shoe toss while on airex, pt noted to have no LOB however intermittently leaning against PTA for balance correction. Pt initially demonstrating limited reach outside BOS however improved with repetitions. Pt also participated in peg board activity on Airex with moderately complex challenge. Pt able to maintain balance with LUE on table tray and solely using RUE. Pt required min cues for correct peg placement. Throughout activities pt required seated rests due to increased L flank pain which decreased with rest. Pt ambulated to day room with RW and min guard and participated in NuStep L3 BLE only  x 5 min for endurance. Pt transported back to room at end of session requesting to use bathroom. Performed ambulatory transfer with RW to bathroom then discarded RW and walked without AD to toilet pt maintained standing while performing clothing management and provided single UE support during urinary void. Pt resumed using RW and returned to recliner at end of session. Pt left in recliner with chair alarm on, call bell within reach and needs met.      Therapy Documentation Precautions:  Precautions Precautions: Fall Restrictions Weight Bearing Restrictions: No General:   Vital Signs:  Pain: Pain Assessment Pain Scale: 0-10 Pain Score: 6  Pain Type: Acute pain Pain Location: Flank Pain Orientation: Left Pain Intervention(s): Medication (See eMAR)  See Function Navigator for Current Functional Status.   Therapy/Group: Individual Therapy  Smith Potenza  Finnis Colee, PTA  06/02/2017, 12:04 PM

## 2017-06-02 NOTE — Care Management (Signed)
Inpatient [facility] Individual Statement of Services  Patient Name:  Malik [name]  Date:  06/02/2017  Welcome to the [facility].  Our goal is to provide you with an individualized program based on your diagnosis and situation, designed to meet your specific needs.  With this comprehensive rehabilitation program, you will be expected to participate in at least 3 hours of rehabilitation therapies Monday-Friday, with modified therapy programming on the weekends.  Your rehabilitation program will include the following services:  Physical Therapy (PT), Occupational Therapy (OT), Speech Therapy (ST), 24 hour per day rehabilitation nursing, Therapeutic Recreaction (TR), Neuropsychology, Case Management (Social Worker), Rehabilitation Medicine, Nutrition Services and Pharmacy Services  Weekly team conferences will be held on Wednesdays to discuss your progress.  Your Social Worker will talk with you frequently to get your input and to update you on team discussions.  Team conferences with you and your family in attendance may also be held.  Expected length of stay: 11-16 days    Overall anticipated outcome: supervision  Depending on your progress and recovery, your program may change. Your Social Worker will coordinate services and will keep you informed of any changes. Your Social Worker's name and contact numbers are listed  below.  The following services may also be recommended but are not provided by the [facility] will be made to provide these services after discharge if needed.  Arrangements include referral to agencies that provide these services.  Your insurance has been verified to be:  Medicare Your primary doctor is:  None  Pertinent information will be shared with your doctor and your insurance company.  Social  Worker:  [place], [place] or [phone]   Information discussed with and copy given to patient by: Lennart Pall, 06/02/2017, 1:56 PM

## 2017-06-02 NOTE — Progress Notes (Signed)
Occupational Therapy Session Note  Patient Details  Name: Malik [name] MRN: [id number] Date of Birth: Jan 07, 1946  Today's Date: 06/02/2017 OT Individual Time: 1302-1350 OT Individual Time Calculation (min): 48 min    Short Term Goals: Week 1:  OT Short Term Goal 1 (Week 1): Pt will bathe at shower level with min guard OT Short Term Goal 2 (Week 1): Pt will transfer to toilet with supervision  OT Short Term Goal 3 (Week 1): Pt will perform standing task with supervison for ~5 min before rest break  Skilled Therapeutic Interventions/Progress Updates:    Treatment session with focus on functional mobility and participation in functional tasks.  Pt received upright in recliner, reporting pain in back and along ribs on Rt.  Encouraged increased activity to decrease pain with pt initially refusing.  Engaged in discussion of pt personal goals as well as therapy goals.  Pt willing to engage in limited mobility.  Ambulated approx 100' with RW and min guard for endurance and to increase activity tolerance.  Pt declined further activity and requested to return to room.  Engaged in trunk mobility with seated stretches within pain tolerance to decrease c/o pain in back and ribs.  Therapy Documentation Precautions:  Precautions Precautions: Fall Restrictions Weight Bearing Restrictions: No General: General OT Amount of Missed Time: 12 Minutes Vital Signs: Therapy Vitals Temp: 98.2 F (36.8 C) Temp Source: Oral Pulse Rate: 73 Resp: 18 BP: (!) 153/72 Patient Position (if appropriate): Lying Oxygen Therapy SpO2: 92 % O2 Device: Room Air Pain:  Pt with c/o pain in Rt side/ribs.  RN notified, provided pain meds.  See Function Navigator for Current Functional Status.   Therapy/Group: Individual Therapy  [name] 06/02/2017, 3:52 PM

## 2017-06-03 ENCOUNTER — Inpatient Hospital Stay (HOSPITAL_COMMUNITY): Admitting: Physical Therapy

## 2017-06-03 ENCOUNTER — Inpatient Hospital Stay (HOSPITAL_COMMUNITY)

## 2017-06-03 ENCOUNTER — Inpatient Hospital Stay (HOSPITAL_COMMUNITY): Payer: Medicare Other | Admitting: *Deleted

## 2017-06-03 ENCOUNTER — Inpatient Hospital Stay (HOSPITAL_COMMUNITY): Payer: Medicare Other | Admitting: Occupational Therapy

## 2017-06-03 DIAGNOSIS — N390 Urinary tract infection, site not specified: Secondary | ICD-10-CM

## 2017-06-03 LAB — CBC WITH DIFFERENTIAL/PLATELET
BASOS ABS: 0 10*3/uL (ref 0.0–0.1)
Basophils Relative: 0 %
EOS PCT: 5 %
Eosinophils Absolute: 0.6 10*3/uL (ref 0.0–0.7)
HEMATOCRIT: 27.6 % — AB (ref 39.0–52.0)
HEMOGLOBIN: 8.2 g/dL — AB (ref 13.0–17.0)
LYMPHS ABS: 2.1 10*3/uL (ref 0.7–4.0)
LYMPHS PCT: 19 %
MCH: 22.8 pg — ABNORMAL LOW (ref 26.0–34.0)
MCHC: 29.7 g/dL — ABNORMAL LOW (ref 30.0–36.0)
MCV: 76.9 fL — AB (ref 78.0–100.0)
MONOS PCT: 9 %
Monocytes Absolute: 1 10*3/uL (ref 0.1–1.0)
Neutro Abs: 7.3 10*3/uL (ref 1.7–7.7)
Neutrophils Relative %: 67 %
Platelets: 1036 10*3/uL (ref 150–400)
RBC: 3.59 MIL/uL — AB (ref 4.22–5.81)
RDW: 19.5 % — ABNORMAL HIGH (ref 11.5–15.5)
WBC: 11 10*3/uL — ABNORMAL HIGH (ref 4.0–10.5)

## 2017-06-03 LAB — GLUCOSE, CAPILLARY
GLUCOSE-CAPILLARY: 100 mg/dL — AB (ref 65–99)
GLUCOSE-CAPILLARY: 178 mg/dL — AB (ref 65–99)
Glucose-Capillary: 80 mg/dL (ref 65–99)
Glucose-Capillary: 119 mg/dL — ABNORMAL HIGH (ref 65–99)

## 2017-06-03 LAB — URINE CULTURE: Culture: >=100000 — AB

## 2017-06-03 MED ORDER — LISINOPRIL 5 MG PO TABS: 5.0000 mg | ORAL_TABLET | Freq: Every day | ORAL | Status: DC

## 2017-06-03 MED ORDER — HYDRALAZINE HCL 10 MG PO TABS
10.0000 mg | ORAL_TABLET | Freq: Three times a day (TID) | ORAL | Status: DC
  Administered 2017-06-03 – 2017-06-04 (×4): 10 mg via ORAL
  Filled 2017-06-03 (×4): qty 1

## 2017-06-03 MED ORDER — PRO-STAT SUGAR FREE PO LIQD
30.0000 mL | Freq: Three times a day (TID) | ORAL | Status: DC
  Administered 2017-06-03 – 2017-06-07 (×12): 30 mL via ORAL
  Filled 2017-06-03 (×12): qty 30

## 2017-06-03 NOTE — Progress Notes (Signed)
Occupational Therapy Session Note  Patient Details  Name: Malik [name] MRN: [id number] Date of Birth: April 29, 1945  Today's Date: 06/03/2017 OT Individual Time: 1500-1553 OT Individual Time Calculation (min): 53 min    Short Term Goals: Week 1:  OT Short Term Goal 1 (Week 1): Pt will bathe at shower level with min guard OT Short Term Goal 2 (Week 1): Pt will transfer to toilet with supervision  OT Short Term Goal 3 (Week 1): Pt will perform standing task with supervison for ~5 min before rest break  Skilled Therapeutic Interventions/Progress Updates:    Treatment session with focus on functional mobility and self-care retraining.  Pt agreeable with max encouragement to engage in bathing at shower level.  Pt ambulated to room shower with RW and then left RW to the side to ambulate to toilet to urinate.  Min guard throughout mobility due to impulsivity and leaving RW despite cues to utilize.  Bathing completed at sit > stand level in room shower, propping BLE on tub bench to wash feet despite cues to sit and wash lower legs in figure 4 position.  Completed dressing at sit > stand level from EOB with increased time to don socks, pt initially requested assist to don socks but able to complete after encouragement.  Pt very tangential and required cues to redirect to therapy session and focus on increased activity tolerance, safety, and independence with ADLs in preparation for d/c home.  Pt left semi-reclined in bed with all needs in reach.  Therapy Documentation Precautions:  Precautions Precautions: Fall Restrictions Weight Bearing Restrictions: No General:   Vital Signs: Therapy Vitals Temp: 97.9 F (36.6 C) Temp Source: Oral Pulse Rate: 97 Resp: 18 BP: (!) 125/93 Patient Position (if appropriate): Lying Oxygen Therapy SpO2: 99 % O2 Device: Room Air Pain: Pain Assessment Pain Scale: 0-10 Pain Score: Asleep Pain Type: Surgical pain Pain Location: Abdomen Pain Orientation:  Medial Pain Intervention(s): Medication (See eMAR)  See Function Navigator for Current Functional Status.   Therapy/Group: Individual Therapy  [name] 06/03/2017, 3:58 PM

## 2017-06-03 NOTE — Progress Notes (Signed)
Physical Therapy Session Note  Patient Details  Name: Malik [name] MRN: [id number] Date of Birth: 1945-11-16  Today's Date: 06/03/2017 PT Individual Time: 1300-1410 PT Individual Time Calculation (min): 70 min   Short Term Goals: Week 1:  PT Short Term Goal 1 (Week 1): Pt will perform all bed mobility with mod assist consistently PT Short Term Goal 2 (Week 1): Pt will perform stand pivot tranfers to WC with min assist and LRAD  PT Short Term Goal 3 (Week 1): Pt will ambulate 11f with min assist and LRAD  PT Short Term Goal 4 (Week 1): Pt will propell WC 1093fwith supervision assist  PT Short Term Goal 5 (Week 1): Pt will initate stair training.   Skilled Therapeutic Interventions/Progress Updates: Pt presented in bed requesting pen to attempt to recall phone number. Pt required cues for strategies however providing extra numbers and when repeating numbers juxtaposing said numbers. Pt ultimately calling sister to receive accurate information. Pt the agreeable to OOB performing supine to sit supervision with HOB elevated. Pt ambulated to day room with RW min guard. Participated in NuStep L4 x 16 minutes for endurance and LE strengthening. Pt ambulated to rehab gym requesting use of RW due to increasing L flank pain and participated in LE therex including B LAQ, hip flexion RLE (due to L side increasing flank pain), 2 x 10 ea, hip er with L 3 resistance band, ball squeezes x 20 ea. Pt c/o increasing L flank pain, returned to room with RW and agreeable to sit in recliner until next session. Notified nsg regarding pain.  Pt left in recliner with chair alarm on, call bell within reach and needs met.      Therapy Documentation Precautions:  Precautions Precautions: Fall Restrictions Weight Bearing Restrictions: No General:   Vital Signs:   See Function Navigator for Current Functional Status.   Therapy/Group: Individual Therapy  Malik [name]  Malik [name], PTA  06/03/2017,  2:18 PM
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
## 2017-06-03 NOTE — Progress Notes (Signed)
Physical Therapy Session Note  Patient Details  Name: Malik [name] MRN: [id number] Date of Birth: 1945/07/16  Today's Date: 06/03/2017 PT Individual Time: 0900-0930 PT Individual Time Calculation (min): 30 min   Short Term Goals: Week 1:  PT Short Term Goal 1 (Week 1): Pt will perform all bed mobility with mod assist consistently PT Short Term Goal 2 (Week 1): Pt will perform stand pivot tranfers to WC with min assist and LRAD  PT Short Term Goal 3 (Week 1): Pt will ambulate 147ft with min assist and LRAD  PT Short Term Goal 4 (Week 1): Pt will propell WC 145ft with supervision assist  PT Short Term Goal 5 (Week 1): Pt will initate stair training.   Skilled Therapeutic Interventions/Progress Updates:    Pt supine in bed, agreeable to participate in therapy session. Pt reports no pain this AM but continues to perseverate on possibility of abdominal pain with movement and needs increased time and encouragement to engage in therapeutic activities. Supine to sit with SBA, HOB elevated, use of bedrails, increased time needed to complete. Sit to stand with SBA to RW. Pt requests to urinate but refuses to use RW to ambulate to bathroom, CGA for safety. Standing balance with SBA while urinating, pt is able to complete clothing management and 3/3 toileting steps himself. Ambulation x 200 ft, x 150 ft with RW and SBA. Pt left seated in recliner in room with needs in reach and chair alarm in place, telesitter in place.  Therapy Documentation Precautions:  Precautions Precautions: Fall Restrictions Weight Bearing Restrictions: No   See Function Navigator for Current Functional Status.   Therapy/Group: Individual Therapy  Excell Seltzer, PT, DPT  06/03/2017, 12:14 PM

## 2017-06-04 ENCOUNTER — Inpatient Hospital Stay (HOSPITAL_COMMUNITY): Admitting: Speech Pathology

## 2017-06-04 ENCOUNTER — Encounter (HOSPITAL_COMMUNITY): Admitting: Psychology

## 2017-06-04 ENCOUNTER — Inpatient Hospital Stay (HOSPITAL_COMMUNITY): Admitting: Physical Therapy

## 2017-06-04 ENCOUNTER — Inpatient Hospital Stay (HOSPITAL_COMMUNITY)

## 2017-06-04 ENCOUNTER — Inpatient Hospital Stay (HOSPITAL_COMMUNITY): Payer: Medicare Other | Admitting: Occupational Therapy

## 2017-06-04 LAB — CBC WITH DIFFERENTIAL/PLATELET
BASOS ABS: 0 10*3/uL (ref 0.0–0.1)
BASOS PCT: 0 %
EOS PCT: 6 %
Eosinophils Absolute: 0.6 10*3/uL (ref 0.0–0.7)
HCT: 28.4 % — ABNORMAL LOW (ref 39.0–52.0)
Hemoglobin: 8.4 g/dL — ABNORMAL LOW (ref 13.0–17.0)
LYMPHS PCT: 18 %
Lymphs Abs: 1.6 10*3/uL (ref 0.7–4.0)
MCH: 22.8 pg — ABNORMAL LOW (ref 26.0–34.0)
MCHC: 29.6 g/dL — ABNORMAL LOW (ref 30.0–36.0)
MCV: 77.2 fL — ABNORMAL LOW (ref 78.0–100.0)
MONO ABS: 0.8 10*3/uL (ref 0.1–1.0)
Monocytes Relative: 8 %
Neutro Abs: 6.1 10*3/uL (ref 1.7–7.7)
Neutrophils Relative %: 68 %
PLATELETS: 935 10*3/uL — AB (ref 150–400)
RBC: 3.68 MIL/uL — AB (ref 4.22–5.81)
RDW: 19.4 % — AB (ref 11.5–15.5)
WBC: 9.1 10*3/uL (ref 4.0–10.5)

## 2017-06-04 LAB — BASIC METABOLIC PANEL
Anion gap: 11 (ref 5–15)
BUN: 13 mg/dL (ref 6–20)
CALCIUM: 8.3 mg/dL — AB (ref 8.9–10.3)
CO2: 21 mmol/L — ABNORMAL LOW (ref 22–32)
Chloride: 104 mmol/L (ref 101–111)
Creatinine, Ser: 1.1 mg/dL (ref 0.61–1.24)
GFR calc Af Amer: >60 mL/min (ref >60)
GLUCOSE: 93 mg/dL (ref 65–99)
POTASSIUM: 3.8 mmol/L (ref 3.5–5.1)
SODIUM: 136 mmol/L (ref 135–145)

## 2017-06-04 LAB — GLUCOSE, CAPILLARY
GLUCOSE-CAPILLARY: 128 mg/dL — AB (ref 65–99)
GLUCOSE-CAPILLARY: 118 mg/dL — AB (ref 65–99)
Glucose-Capillary: 67 mg/dL (ref 65–99)
Glucose-Capillary: 130 mg/dL — ABNORMAL HIGH (ref 65–99)

## 2017-06-04 MED ORDER — HYDRALAZINE HCL 25 MG PO TABS
25.0000 mg | ORAL_TABLET | Freq: Three times a day (TID) | ORAL | Status: DC
  Administered 2017-06-04 – 2017-06-05 (×3): 25 mg via ORAL
  Filled 2017-06-04 (×3): qty 1

## 2017-06-04 MED ORDER — CIPROFLOXACIN HCL 500 MG PO TABS
500.0000 mg | ORAL_TABLET | Freq: Two times a day (BID) | ORAL | Status: AC
  Administered 2017-06-04: 500 mg via ORAL
  Filled 2017-06-04: qty 1

## 2017-06-04 MED ORDER — METFORMIN HCL 500 MG PO TABS: 1000.0000 mg | ORAL_TABLET | Freq: Two times a day (BID) | ORAL | Status: DC

## 2017-06-04 MED ORDER — METFORMIN HCL 500 MG PO TABS
500.0000 mg | ORAL_TABLET | Freq: Two times a day (BID) | ORAL | Status: DC
  Administered 2017-06-04 – 2017-06-07 (×6): 500 mg via ORAL
  Filled 2017-06-04 (×6): qty 1

## 2017-06-04 NOTE — Significant Event (Signed)
Hypoglycemic Event  CBG: 67  Treatment: 15 GM carbohydrate snack  Symptoms: None  Follow-up CBG: [id number] CBG Result:92  Possible Reasons for Event: Unknown  Comments/MD notified:no    Malik [name]
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
## 2017-06-04 NOTE — Progress Notes (Signed)
Occupational Therapy Session Note  Patient Details  Name: Malik [name] MRN: [id number] Date of Birth: 1945/04/09  Today's Date: 06/04/2017 OT Individual Time: 1305-1400 OT Individual Time Calculation (min): 55 min    Short Term Goals: Week 1:  OT Short Term Goal 1 (Week 1): Pt will bathe at shower level with min guard OT Short Term Goal 2 (Week 1): Pt will transfer to toilet with supervision  OT Short Term Goal 3 (Week 1): Pt will perform standing task with supervison for ~5 min before rest break  Skilled Therapeutic Interventions/Progress Updates:    Treatment session with focus on functional mobility, bathroom transfers, and overall activity tolerance.  Pt received supine in bed reporting discomfort in ribs but willing to engage in treatment session.  Pt ambulated to ADL apt with RW with supervision.  Engaged in [place] transfers stepping over tub ledge with supervision.  Pt reports having grab bars in shower and has a "stool" that he can use for energy conservation.  Discussed plan to attempt bathing in standing during next session with pt agreeable.  Pt completed toilet transfer without RW with supervision and completed toileting tasks with supervision.  Ambulated to Dayroom with RW with supervision.  Engaged in [place] bowling activity in standing to challenge standing tolerance and balance.  Pt tolerated standing ~15 mins without seated rest break.  Therapeutic rest break before ambulating >150' with RW back to room.  Pt returned to bed and left semi-reclined with all needs in reach.  Therapy Documentation Precautions:  Precautions Precautions: Fall Restrictions Weight Bearing Restrictions: No General:   Vital Signs: Therapy Vitals Temp: 98.3 F (36.8 C) Temp Source: Oral Pulse Rate: 74 Resp: 18 BP: (!) 160/80 Patient Position (if appropriate): Lying Oxygen Therapy SpO2: 97 % O2 Device: Room Air Pain: Pain Assessment Pain Scale: 0-10 Pain Score: 0-No pain Faces Pain  Scale: No hurt PAINAD (Pain Assessment in Advanced Dementia) Breathing: normal  See Function Navigator for Current Functional Status.   Therapy/Group: Individual Therapy  [name] 06/04/2017, 4:13 PM
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
## 2017-06-04 NOTE — Progress Notes (Addendum)
Physical Therapy Note  Patient Details  Name: Malik [name] MRN: [id number] Date of Birth: [birth year] Today's Date: 06/04/2017  0800-0830, 30 min individual tx Pain: 4/10 L rib pain; premedicated  Bed mobility using bed features with supervision.  Stand pivot transfer bed> w/c with supervision.  Bed mobility on regular bed in ADL apt, with supervision, extra time due to rib pain. Pt stated that he fears his rib fxs will get worse if he moves too much.  PT provided ed re: rib fxs and risk for PNA if he avoids moving too much.  Pt voiced understanding.   Supine strengthening: 2 x 10 bil bridging, bil hip adductor squeezes.  Pt noted to have core instability during bridging.  Gait training 100' on carpet and level tile with RW, supervision.  Pt forgets to reach back before sitting, despite cues.   Pt left resting in w/c with quick release belt applied and all needs within reach.  See function navigator for current status.   Kale Dols 06/04/2017, 7:47 AM

## 2017-06-04 NOTE — Progress Notes (Signed)
Physical Therapy Weekly Progress Note  Patient Details  Name: Malik [name] MRN: [id number] Date of Birth: 22-Jul-1945  Beginning of progress report period: May 28, 2017 End of progress report period: June 04, 2017  Today's Date: 06/04/2017 PT Individual Time: 1100-1200 PT Individual Time Calculation (min): 60 min   Patient has met 5 of 5 short term goals.  Pt has made significant progress towards long term goals over the last week. Improved mental state, and decreased pain, improved balance have allow pt to progress to supervision-min assist with all bed mobility, tranfers, and  Ambulation.   Patient continues to demonstrate the following deficits muscle weakness, decreased cardiorespiratoy endurance, decreased problem solving, decreased safety awareness, decreased memory and delayed processing and decreased standing balance and decreased balance strategies and therefore will continue to benefit from skilled PT intervention to increase functional independence with mobility.  Patient progressing toward long term goals..  Continue plan of care.  PT Short Term Goals Week 1:  PT Short Term Goal 1 (Week 1): Pt will perform all bed mobility with mod assist consistently PT Short Term Goal 1 - Progress (Week 1): Met PT Short Term Goal 2 (Week 1): Pt will perform stand pivot tranfers to WC with min assist and LRAD  PT Short Term Goal 2 - Progress (Week 1): Met PT Short Term Goal 3 (Week 1): Pt will ambulate 144f with min assist and LRAD  PT Short Term Goal 3 - Progress (Week 1): Met PT Short Term Goal 4 (Week 1): Pt will propell WC 1076fwith supervision assist  PT Short Term Goal 4 - Progress (Week 1): Met PT Short Term Goal 5 (Week 1): Pt will initate stair training.  PT Short Term Goal 5 - Progress (Week 1): Met Week 2:  PT Short Term Goal 1 (Week 2): STG=LTG due to ELOS   Skilled Therapeutic Interventions/Progress Updates:   Pt received supine in bed and agreeable to PT. Supine>sit  transfer with supervision assist and min cues  for LE management   ambulatory transfer to bathroom. Supervision assist for urination while standing. Pt also performed lower body dressing with supervision assist, standing with 1 UE support on rail in bathroom.   Gait to Rehab gym x 15059fto day room x [id number] returning to room x 200ft67fh RW supervision assist. Min cues for AD management in crowded environment.    Dynamic gait training to weave through 10 cones x 2 supervision assist from PT. Gait over unlevel surface and obstacles 1-6 inches in height;  supervision assist from PT. Min cues for safety awareness and improved step length to clear obstacles.   Standing balance/tolerance instructed by PT while engaging to Wii sports bowling activity. Pt able to maintain standing up to 10 minutes with Supervision assist, prior to requesting to sit and rest due to pain.   Patient returned to room and left sitting EOB with RN present with call bell in reach and all needs met.            Therapy Documentation Precautions:  Precautions Precautions: Fall Restrictions Weight Bearing Restrictions: No Pain: Pain Assessment Pain Scale: 0-10 Pain Score: 0-No pain    See Function Navigator for Current Functional Status.  Therapy/Group: Individual Therapy  AustiLorie Phenix/2019, 12:41 PM

## 2017-06-05 ENCOUNTER — Inpatient Hospital Stay (HOSPITAL_COMMUNITY): Admitting: Speech Pathology

## 2017-06-05 ENCOUNTER — Inpatient Hospital Stay (HOSPITAL_COMMUNITY): Admitting: Occupational Therapy

## 2017-06-05 ENCOUNTER — Inpatient Hospital Stay (HOSPITAL_COMMUNITY): Admitting: Physical Therapy

## 2017-06-05 DIAGNOSIS — D473 Essential (hemorrhagic) thrombocythemia: Secondary | ICD-10-CM

## 2017-06-05 DIAGNOSIS — D75839 Thrombocytosis, unspecified: Secondary | ICD-10-CM

## 2017-06-05 LAB — CBC WITH DIFFERENTIAL/PLATELET
Basophils Absolute: 0 10*3/uL (ref 0.0–0.1)
Basophils Relative: 0 %
EOS ABS: 0.5 10*3/uL (ref 0.0–0.7)
EOS PCT: 5 %
HCT: 28.4 % — ABNORMAL LOW (ref 39.0–52.0)
Hemoglobin: 8.3 g/dL — ABNORMAL LOW (ref 13.0–17.0)
LYMPHS ABS: 1.9 10*3/uL (ref 0.7–4.0)
Lymphocytes Relative: 17 %
MCH: 22.6 pg — AB (ref 26.0–34.0)
MCHC: 29.2 g/dL — AB (ref 30.0–36.0)
MCV: 77.4 fL — AB (ref 78.0–100.0)
MONO ABS: 0.8 10*3/uL (ref 0.1–1.0)
MONOS PCT: 8 %
Neutro Abs: 7.6 10*3/uL (ref 1.7–7.7)
Neutrophils Relative %: 70 %
PLATELETS: 852 10*3/uL — AB (ref 150–400)
RBC: 3.67 MIL/uL — AB (ref 4.22–5.81)
RDW: 19.1 % — AB (ref 11.5–15.5)
WBC: 10.9 10*3/uL — AB (ref 4.0–10.5)

## 2017-06-05 LAB — GLUCOSE, CAPILLARY
GLUCOSE-CAPILLARY: 91 mg/dL (ref 65–99)
Glucose-Capillary: 114 mg/dL — ABNORMAL HIGH (ref 65–99)
Glucose-Capillary: 126 mg/dL — ABNORMAL HIGH (ref 65–99)
Glucose-Capillary: 103 mg/dL — ABNORMAL HIGH (ref 65–99)

## 2017-06-05 LAB — OCCULT BLOOD X 1 CARD TO LAB, STOOL: Fecal Occult Bld: POSITIVE — AB

## 2017-06-05 MED ORDER — FERROUS SULFATE 325 (65 FE) MG PO TABS
325.0000 mg | ORAL_TABLET | Freq: Every day | ORAL | Status: DC
  Administered 2017-06-05 – 2017-06-07 (×3): 325 mg via ORAL
  Filled 2017-06-05 (×2): qty 1

## 2017-06-05 MED ORDER — VITAMIN C 500 MG PO TABS
250.0000 mg | ORAL_TABLET | Freq: Every day | ORAL | Status: DC
  Administered 2017-06-05 – 2017-06-07 (×3): 250 mg via ORAL
  Filled 2017-06-05 (×2): qty 1

## 2017-06-05 MED ORDER — HYDRALAZINE HCL 50 MG PO TABS
75.0000 mg | ORAL_TABLET | Freq: Three times a day (TID) | ORAL | Status: DC
  Administered 2017-06-05 – 2017-06-06 (×4): 75 mg via ORAL
  Filled 2017-06-05 (×3): qty 1

## 2017-06-05 NOTE — Progress Notes (Signed)
Physical Therapy Session Note  Patient Details  Name: Atul Delucia MRN: [id number] Date of Birth: June 04, 1945  Today's Date: 06/05/2017 PT Individual Time: 1010-1105 PT Individual Time Calculation (min): 55 min   Short Term Goals: Week 1:  PT Short Term Goal 1 (Week 1): Pt will perform all bed mobility with mod assist consistently PT Short Term Goal 1 - Progress (Week 1): Met PT Short Term Goal 2 (Week 1): Pt will perform stand pivot tranfers to WC with min assist and LRAD  PT Short Term Goal 2 - Progress (Week 1): Met PT Short Term Goal 3 (Week 1): Pt will ambulate 16f with min assist and LRAD  PT Short Term Goal 3 - Progress (Week 1): Met PT Short Term Goal 4 (Week 1): Pt will propell WC 1041fwith supervision assist  PT Short Term Goal 4 - Progress (Week 1): Met PT Short Term Goal 5 (Week 1): Pt will initate stair training.  PT Short Term Goal 5 - Progress (Week 1): Met Week 2:  PT Short Term Goal 1 (Week 2): STG=LTG due to ELOS   Skilled Therapeutic Interventions/Progress Updates:   Pt received sitting in WC and agreeable to PT  PT instructed pt in gait training with RW to and from rehab gym and Day room 4 x 15051fnd supervision assist. Min cues for safety in turn and to navigate around crowed hall way.   Modified [place] level B balance exercises instructed by PT Stairs x 12.  Mini squats x 10  Forward/backward gait [id number] each direction.  Figure 8 walking with cones at 5ft 62frt.  Tandem stance x 30sec x2 BLE  SLS with 1 UE support 2 x 10 sec.  Sit<>stand x 10 without UE support  PT provided min assist throughout dynamic balance training with min verbal and tactile due for improved weight shift and use of ankle strategy to prevent LOB and prevent compensatory movements.   Nustep endurance training x 6 min, BLE only, Pt rates Borg RPE 15/20 following endurance training. Min cues to proper speed and ROM to prevent excessive fatigue and allow increased benefit of  exercise.    Patient returned to room and [facility] reach and all needs met.        Therapy Documentation Precautions:  Precautions Precautions: Fall Restrictions Weight Bearing Restrictions: No   Pain: Pain Assessment Pain Scale: Faces Pain Score: 3  Faces Pain Scale: Hurts a little bit Pain Type: Acute pain Pain Location: Back Pain Orientation: Left Pain Descriptors / Indicators: Aching Pain Onset: Gradual Patients Stated Pain Goal: 0 Pain Intervention(s): Repositioned Multiple Pain Sites: No PAINAD (Pain Assessment in Advanced Dementia) Breathing: normal Negative Vocalization: none Facial Expression: smiling or inexpressive Body Language: relaxed Consolability: no need to console PAINAD Score: 0   See Function Navigator for Current Functional Status.   Therapy/Group: Individual Therapy  AustiLorie Phenix/2019, 11:01 AM
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
## 2017-06-05 NOTE — Progress Notes (Signed)
Occupational Therapy Session Note  Patient Details  Name: Malik [name] MRN: [id number] Date of Birth: August 30, 1945  Today's Date: 06/05/2017 OT Individual Time: 4580-9983 OT Individual Time Calculation (min): 30 min  and Today's Date: 06/05/2017 OT Missed Time: 30 Minutes Missed Time Reason: Patient fatigue;Pain   Short Term Goals: Week 1:  OT Short Term Goal 1 (Week 1): Pt will bathe at shower level with min guard OT Short Term Goal 2 (Week 1): Pt will transfer to toilet with supervision  OT Short Term Goal 3 (Week 1): Pt will perform standing task with supervison for ~5 min before rest break  Skilled Therapeutic Interventions/Progress Updates:    Treatment session with focus on d/c planning.  Pt reporting fatigue and "worn out" from all the activities he completed with therapies earlier in the day and refusing OOB activity.  Encouraged pt to engage in bathing at shower level due to reports of pain in back and ribs, with pt stating that might feel good but that he was not interested in that today.  Engaged in conversation regarding upcoming d/c with pt reporting ready to d/c home.  Discussed safety with shower transfers, bathing in standing, and cooking.  Pt reports sister plans to provide supervision and prepare meals. Pt verbalized agreement to engage in shower during treatment session tomorrow, continuing to refuse any activity at this time.    Therapy Documentation Precautions:  Precautions Precautions: Fall Restrictions Weight Bearing Restrictions: No General: General OT Amount of Missed Time: 30 Minutes Vital Signs: Therapy Vitals Temp: 98 F (36.7 C) Temp Source: Oral Pulse Rate: 72 Resp: 18 BP: (!) 142/80 Patient Position (if appropriate): Lying Oxygen Therapy SpO2: 98 % O2 Device: Room Air Pain:  Pt with c/o pain in back and ribs, not rated. RN aware.  See Function Navigator for Current Functional Status.   Therapy/Group: Individual Therapy  [name] 06/05/2017, 3:25 PM

## 2017-06-05 NOTE — Significant Event (Signed)
1800, removed Lidocaine patch states "I don't need this now" Used patch placed in Sharps container
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
## 2017-06-05 NOTE — Progress Notes (Signed)
Occupational Therapy Session Note  Patient Details  Name: Malik [name] MRN: [id number] Date of Birth: 10-29-1945  Today's Date: 06/05/2017 OT Individual Time: 1130-1200 OT Individual Time Calculation (min): 30 min    Short Term Goals: Week 1:  OT Short Term Goal 1 (Week 1): Pt will bathe at shower level with min guard OT Short Term Goal 2 (Week 1): Pt will transfer to toilet with supervision  OT Short Term Goal 3 (Week 1): Pt will perform standing task with supervison for ~5 min before rest break Week 2:     Skilled Therapeutic Interventions/Progress Updates:    1:1 [place] activity with focus on activity tolerance and standign dynamic balance. Pt ambulated from room to dayroom ~300 feet with supervision. Engaged in game of corn hole without UE support with close supervision. Pt required rest breaks in between 3 games. Ambulated back to room with more distant supervision and transferred into w/c mod I. Safety belt and chair alarm applied.   Therapy Documentation Precautions:  Precautions Precautions: Fall Restrictions Weight Bearing Restrictions: No Pain: Pain Assessment Pain Scale: Faces Faces Pain Scale: Hurts a little bit Pain Type: Acute pain Pain Location: Back Pain Descriptors / Indicators: Aching Pain Intervention(s): Repositioned and allowed for rest breaks prn ADL: ADL ADL Comments: see functional navigator  See Function Navigator for Current Functional Status.   Therapy/Group: Individual Therapy  Willeen Cass [facility] 06/05/2017, 12:04 PM

## 2017-06-06 ENCOUNTER — Inpatient Hospital Stay (HOSPITAL_COMMUNITY): Admitting: Speech Pathology

## 2017-06-06 ENCOUNTER — Inpatient Hospital Stay (HOSPITAL_COMMUNITY): Admitting: Occupational Therapy

## 2017-06-06 ENCOUNTER — Inpatient Hospital Stay (HOSPITAL_COMMUNITY): Admitting: Physical Therapy

## 2017-06-06 LAB — CBC WITH DIFFERENTIAL/PLATELET
BASOS ABS: 0 10*3/uL (ref 0.0–0.1)
BASOS PCT: 0 %
Eosinophils Absolute: 0.5 10*3/uL (ref 0.0–0.7)
Eosinophils Relative: 5 %
HEMATOCRIT: 30.7 % — AB (ref 39.0–52.0)
HEMOGLOBIN: 9.3 g/dL — AB (ref 13.0–17.0)
Lymphocytes Relative: 21 %
Lymphs Abs: 2.1 10*3/uL (ref 0.7–4.0)
MCH: 23.3 pg — ABNORMAL LOW (ref 26.0–34.0)
MCHC: 30.3 g/dL (ref 30.0–36.0)
MCV: 76.8 fL — ABNORMAL LOW (ref 78.0–100.0)
Monocytes Absolute: 0.9 10*3/uL (ref 0.1–1.0)
Monocytes Relative: 9 %
NEUTROS ABS: 6.6 10*3/uL (ref 1.7–7.7)
NEUTROS PCT: 65 %
Platelets: 778 10*3/uL — ABNORMAL HIGH (ref 150–400)
RBC: 4 MIL/uL — ABNORMAL LOW (ref 4.22–5.81)
RDW: 19.2 % — ABNORMAL HIGH (ref 11.5–15.5)
WBC: 10.1 10*3/uL (ref 4.0–10.5)

## 2017-06-06 LAB — GLUCOSE, CAPILLARY
GLUCOSE-CAPILLARY: 94 mg/dL (ref 65–99)
GLUCOSE-CAPILLARY: 61 mg/dL — AB (ref 65–99)
GLUCOSE-CAPILLARY: 132 mg/dL — AB (ref 65–99)
GLUCOSE-CAPILLARY: 82 mg/dL (ref 65–99)
Glucose-Capillary: 76 mg/dL (ref 65–99)
Glucose-Capillary: 76 mg/dL (ref 65–99)
Glucose-Capillary: 61 mg/dL — ABNORMAL LOW (ref 65–99)

## 2017-06-06 MED ORDER — AMLODIPINE BESYLATE 10 MG PO TABS
10.0000 mg | ORAL_TABLET | Freq: Every day | ORAL | 0 refills | Status: DC
Start: 2017-06-06 — End: 2019-10-28

## 2017-06-06 MED ORDER — THIAMINE HCL 100 MG PO TABS: 100.0000 mg | ORAL_TABLET | Freq: Every day | ORAL | 0 refills | Status: DC

## 2017-06-06 MED ORDER — ASCORBIC ACID 250 MG PO TABS: 250.0000 mg | ORAL_TABLET | Freq: Every day | ORAL | 0 refills | Status: DC

## 2017-06-06 MED ORDER — ACETAMINOPHEN 325 MG PO TABS: 325.0000 mg | ORAL_TABLET | ORAL | Status: DC | PRN

## 2017-06-06 MED ORDER — ATORVASTATIN CALCIUM 40 MG PO TABS: 40.0000 mg | ORAL_TABLET | Freq: Every day | ORAL | 0 refills | Status: DC

## 2017-06-06 MED ORDER — GLIPIZIDE ER 5 MG PO TB24
5.0000 mg | ORAL_TABLET | Freq: Every day | ORAL | Status: DC
  Administered 2017-06-07: 5 mg via ORAL
  Filled 2017-06-06: qty 1

## 2017-06-06 MED ORDER — HYDRALAZINE HCL 100 MG PO TABS: 100.0000 mg | ORAL_TABLET | Freq: Three times a day (TID) | ORAL | 0 refills | Status: DC

## 2017-06-06 MED ORDER — FERROUS SULFATE 325 (65 FE) MG PO TABS: 325.0000 mg | ORAL_TABLET | Freq: Every day | ORAL | 0 refills | Status: DC

## 2017-06-06 MED ORDER — METHOCARBAMOL 500 MG PO TABS: 500.0000 mg | ORAL_TABLET | Freq: Four times a day (QID) | ORAL | 0 refills | Status: DC | PRN

## 2017-06-06 MED ORDER — HYDRALAZINE HCL 50 MG PO TABS
100.0000 mg | ORAL_TABLET | Freq: Three times a day (TID) | ORAL | Status: DC
  Administered 2017-06-06 – 2017-06-07 (×3): 100 mg via ORAL
  Filled 2017-06-06 (×3): qty 2

## 2017-06-06 MED ORDER — METOPROLOL TARTRATE 25 MG PO TABS: 25.0000 mg | ORAL_TABLET | Freq: Two times a day (BID) | ORAL | 0 refills | Status: DC

## 2017-06-06 MED ORDER — METFORMIN HCL 500 MG PO TABS: 500.0000 mg | ORAL_TABLET | Freq: Two times a day (BID) | ORAL | 0 refills | Status: DC

## 2017-06-06 MED ORDER — TRAMADOL HCL 50 MG PO TABS: 50.0000 mg | ORAL_TABLET | Freq: Four times a day (QID) | ORAL | 0 refills | Status: DC | PRN

## 2017-06-06 MED ORDER — TAB-A-VITE/IRON PO TABS: 1.0000 | ORAL_TABLET | Freq: Every day | ORAL | 0 refills | Status: DC

## 2017-06-06 MED ORDER — FUROSEMIDE 20 MG PO TABS: 20.0000 mg | ORAL_TABLET | Freq: Every day | ORAL | 0 refills | Status: DC

## 2017-06-06 MED ORDER — TRAZODONE HCL 50 MG PO TABS: 50.0000 mg | ORAL_TABLET | Freq: Every evening | ORAL | 0 refills | Status: DC | PRN

## 2017-06-06 MED ORDER — PANTOPRAZOLE SODIUM 40 MG PO TBEC: 40.0000 mg | DELAYED_RELEASE_TABLET | Freq: Every day | ORAL | 0 refills | Status: AC

## 2017-06-06 MED ORDER — POLYETHYLENE GLYCOL 3350 17 G PO PACK: 17.0000 g | PACK | Freq: Two times a day (BID) | ORAL | 0 refills | Status: DC

## 2017-06-06 MED ORDER — FOLIC ACID 1 MG PO TABS: 1.0000 mg | ORAL_TABLET | Freq: Every day | ORAL | 0 refills | Status: DC

## 2017-06-06 MED ORDER — GLIPIZIDE ER 10 MG PO TB24: 10.0000 mg | ORAL_TABLET | Freq: Every day | ORAL | 0 refills | Status: DC

## 2017-06-06 MED ORDER — CALCIUM CARBONATE-VITAMIN D 500-200 MG-UNIT PO TABS: 1.0000 | ORAL_TABLET | Freq: Two times a day (BID) | ORAL | Status: DC

## 2017-06-06 NOTE — Progress Notes (Signed)
[facility] PHYSICAL MEDICINE & REHABILITATION     PROGRESS NOTE  Subjective/Complaints:  Pt seen lying in bed this AM. He states he slept well overnight. Informed by nursing pt refused labs, discussed with patient, who is now amenable to labs.  He would like to go home.   ROS: Denies CP, SOB, nausea, vomiting, diarrhea  Objective: Vital Signs: Blood pressure (!) 155/69, pulse 72, temperature 98.1 F (36.7 C), temperature source Oral, resp. rate 18, height 5\' 11"  (1.803 m), weight 83.1 kg (183 lb 3.2 oz), SpO2 93 %. No results found. Recent Labs    06/04/17 0502 06/05/17 0617  WBC 9.1 10.9*  HGB 8.4* 8.3*  HCT 28.4* 28.4*  PLT 935* 852*   Recent Labs    06/04/17 0502  NA 136  K 3.8  CL 104  GLUCOSE 93  BUN 13  CREATININE 1.10  CALCIUM 8.3*   CBG (last 3)  Recent Labs    06/05/17 1625 06/05/17 2128 06/06/17 0633  GLUCAP 103* 91 76    Wt Readings from Last 3 Encounters:  06/06/17 83.1 kg (183 lb 3.2 oz)  05/27/17 96.6 kg (212 lb 15.4 oz)  04/27/16 99.8 kg (220 lb)    Physical Exam:  BP (!) 155/69 (BP Location: Left Arm)   Pulse 72   Temp 98.1 F (36.7 C) (Oral)   Resp 18   Ht 5\' 11"  (1.803 m)   Wt 83.1 kg (183 lb 3.2 oz)   SpO2 93%   BMI 25.55 kg/m  Constitutional: He appears well-developed and well-nourished.  HENT: Normocephalic and atraumatic.  Eyes: EOM are normal. No discharge.  Cardiovascular: RRR. No JVD. Respiratory: Effort normal. Decreased breath sounds on left vs right GI: Bowel sounds are normal. He exhibits no distension.  Musculoskeletal: He exhibits no edema or tenderness.  Neurological: He is alert and oriented x3 with increased time, improving Distracted  Motor: grossly 4+-5/5 throughout (stable) Skin: Skin is warm and dry.  Incisions with dressing c/d/i Psychiatric: Flat.    Assessment/Plan: 1. Functional deficits secondary to polytrauma which require 3+ hours per day of interdisciplinary therapy in a comprehensive inpatient  rehab setting. Physiatrist is providing close team supervision and 24 hour management of active medical problems listed below. Physiatrist and rehab team continue to assess barriers to discharge/monitor patient progress toward functional and medical goals.  Function:  Bathing Bathing position Bathing activity did not occur: Refused Position: Production manager parts bathed by patient: Right arm, Left arm, Chest, Abdomen, Front perineal area, Buttocks, Right upper leg, Left upper leg, Right lower leg, Left lower leg Body parts bathed by helper: Back  Bathing assist Assist Level: Supervision or verbal cues      Upper Body Dressing/Undressing Upper body dressing   What is the patient wearing?: Pull over shirt/dress     Pull over shirt/dress - Perfomed by patient: Thread/unthread right sleeve, Thread/unthread left sleeve, Put head through opening, Pull shirt over trunk Pull over shirt/dress - Perfomed by helper: Thread/unthread right sleeve, Thread/unthread left sleeve, Put head through opening, Pull shirt over trunk        Upper body assist Assist Level: Set up   Set up : To obtain clothing/put away  Lower Body Dressing/Undressing Lower body dressing   What is the patient wearing?: Pants, Non-skid slipper socks Underwear - Performed by patient: Thread/unthread right underwear leg, Thread/unthread left underwear leg, Pull underwear up/down   Pants- Performed by patient: Thread/unthread right pants leg, Thread/unthread left pants leg, Pull  pants up/down   Non-skid slipper socks- Performed by patient: Don/doff right sock, Don/doff left sock Non-skid slipper socks- Performed by helper: Don/doff right sock, Don/doff left sock                  Lower body assist Assist for lower body dressing: Supervision or verbal cues      Toileting Toileting   Toileting steps completed by patient: Adjust clothing prior to toileting, Performs perineal hygiene, Adjust clothing after  toileting Toileting steps completed by helper: Performs perineal hygiene Toileting Assistive Devices: Grab bar or rail  Toileting assist Assist level: Supervision or verbal cues   Transfers Chair/bed transfer   Chair/bed transfer method: Stand pivot Chair/bed transfer assist level: Supervision or verbal cues Chair/bed transfer assistive device: Armrests     Locomotion Ambulation     Max distance: 161ft Assist level: Supervision or verbal cues   Wheelchair   Type: Manual Max wheelchair distance: 30 Assist Level: Supervision or verbal cues  Cognition Comprehension Comprehension assist level: Follows basic conversation/direction with extra time/assistive device  Expression Expression assist level: Expresses basic needs/ideas: With extra time/assistive device  Social Interaction Social Interaction assist level: Interacts appropriately 90% of the time - Needs monitoring or encouragement for participation or interaction.  Problem Solving Problem solving assist level: Solves basic 75 - 89% of the time/requires cueing 10 - 24% of the time  Memory Memory assist level: Recognizes or recalls 75 - 89% of the time/requires cueing 10 - 24% of the time    Medical Problem List and Plan:  1. 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    Continue CIR 2. DVT Prophylaxis/Anticoagulation: Pharmaceutical: Lovenox  3. Pain Management: Oxycodone prn.    Lidoderm patch started on 3/20, added second patch on 3/21   Kpad ordered on 3/21   OxyContin 10 twice a day started on 3/21, d/ced on 3/25   Controlled on 3/29 4. Mood: LCSW to follow for evaluation and support.  5. Neuropsych: This patient is not fully capable of making decisions on his own behalf.    Cont tele-sitter 6. Skin/Wound Care: routine pressure relief measures.  7. Fluids/Electrolytes/Nutrition:  Monitor I/Os.  8. Leucocytosis:    Continue to monitor for other signs of infection. Encourage IS    WBCs 10.9 on 3/28   Cont to monitor 9. HTN: Monitor BP bid.    Norvasc increased to 10 on 3/21   Cont Metoprolol.    Hydralazine 10 3 times a day started on 3/26, increased to 25 on 3/27, increased to 75 on 3/28, increased to 100 on 3/29.  10. T2DM: Hgb A1C- 6.2 Was on glipizide and Metformin PTA.    Home regimen restarted on 3/27   Continue to monitor BS ac/hs   Liberalized diet to increase food choices.    Relatively controlled on 3/29 11. Left pleural effusion:    CXR on 3/25, with persistent large left pleural effusion   Encourage use of flutter valve every 2 hours.    Discussed with trauma and VIR, thoracentesis unable to be performed by VIR. Per trauma, follow up in clinic, no other intervention at this time required.   Continue to monitor.  12. Right shoulder pain: X-rays with degenerative changes.  13. ABLA:    Hb 8.3 on 3/28   Cont to monitor 14. Chronic diastolic heart failure: Last documented EF 35%. Low salt diet.  On Lipitor--off lisinopril/HCTZ. Resumed hydralazine  Filed Weights   06/04/17 0346 06/05/17 0453 06/06/17 0500  Weight: 89.4 kg (197 lb 1.5 oz) 85.9 kg (189 lb 6 oz) 83.1 kg (183 lb 3.2 oz)    Lasix 20 started on 3/21 15. Sleep disturbance   Trazodone 50 started on 3/20 16. 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 Acute lower UTI   KUB reviewed, unremarkable   Cipro 3/25-3/27  LOS (Days) 10 A FACE TO FACE EVALUATION WAS PERFORMED  Ankit [name] 06/06/2017 8:17 AM

## 2017-06-06 NOTE — Plan of Care (Signed)
  Problem: RH SAFETY Goal: RH STG ADHERE TO SAFETY PRECAUTIONS W/ASSISTANCE/DEVICE Description STG Adhere to Safety Precautions With min Assistance/Device.  Outcome: Progressing  Continue usage of telesitter, proper foot wear, bed alarm

## 2017-06-06 NOTE — Discharge Instructions (Signed)
Inpatient Rehab Discharge Instructions  Crockett Rallo Discharge date and time:  06/06/17  Activities/Precautions/ Functional Status: Activity: no lifting, driving, or strenuous exercise  till cleared by MD Diet: regular diet Wound Care: keep wound clean and dry    Functional status:  ___ No restrictions     ___ Walk up steps independently _X__ 24/7 supervision/assistance   ___ Walk up steps with assistance ___ Intermittent supervision/assistance  ___ Bathe/dress independently ___ Walk with walker     _X__ Bathe/dress with assistance ___ Walk Independently    ___ Shower independently ___ Walk with assistance    ___ Shower with assistance _X__ No alcohol     ___ Return to work/school ________    COMMUNITY REFERRALS UPON DISCHARGE:    Home Health:   PT                      Agency:  [facility] Phone: [phone]   Medical Equipment/Items Ordered:  Gilford Rile                                                      Agency/Supplier:  [facility] @ [phone]   Special Instructions: 1. Check blood sugars twice a day and record. Follow up with primary MD for medication changes.  2. NO driving till cleared by MD.  3. Use over the counter lidocaine patches for back pain.   My questions have been answered and I understand these instructions. I will adhere to these goals and the provided educational materials after my discharge from the hospital.  Patient/Caregiver Signature _______________________________ Date __________  Clinician Signature _______________________________________ Date __________  Please bring this form and your medication list with you to all your follow-up doctor's appointments.

## 2017-06-06 NOTE — Significant Event (Signed)
Hypoglycemic Event  CBG: 61      Treatment: 15 GM carbohydrate snack  Symptoms: None  Follow-up CBG: Time:1713 CBG Result: 76 Possible Reasons for Event: Inadequate meal intake  Comments/MD notified: pt ate supper and rechecked CBG. Gave pt snack of pb and crackers rechecked 15 mins after eating and CBG 132.  PA P. Love notified   Lynnea Maizes Daila Elbert

## 2017-06-06 NOTE — Progress Notes (Signed)
Social Work Patient ID: Malik [name], male   DOB: Sep 30, 1945, 72 y.o.   MRN: [id number]  Met with pt following team conference on Thursday and discussed earlier d/c date of 3/30 and he is very agreeable.  Feels he is making good progress.  Notes he is still discussing d/c location with family.  He is aware I need to know in order to set up follow up.  Will continue following.  Nashayla Telleria, LCSW

## 2017-06-06 NOTE — Patient Care Conference (Signed)
Inpatient RehabilitationTeam Conference and Plan of Care Update Date: 06/04/2017   Time: 2:45 PM    Patient Name: Malik [name]      Medical Record Number: [id number]  Date of Birth: 07-31-1945 Sex: Male         Room/Bed: 4M01C/4M01C-01 Payor Info: Payor: MEDICARE / Plan: MEDICARE PART A AND B / Product Type: *No Product type* /    Admitting Diagnosis: Tauma Polytrauma  Admit Date/Time:  05/27/2017  7:07 PM Admission Comments: No comment available   Primary Diagnosis:  Trauma Principal Problem: Trauma  Patient Active Problem List   Diagnosis Date Noted  . Thrombocytosis ([place])   . Acute lower UTI   . Labile blood pressure   . Hydrothorax   . Sleep disturbance   . Pleural effusion on left   . Hypertensive crisis   . Hypertension   . Leukocytosis   . Benign essential HTN   . Diabetes mellitus type 2 in nonobese (HCC)   . Acute blood loss anemia   . Trauma 05/27/2017  . TBI (traumatic brain injury) ([place])   . Essential hypertension   . Chronic diastolic congestive heart failure ([place])   . 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 Rib fractures 05/10/2017  . 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    Expected Discharge Date: Expected Discharge Date: 06/07/17  Team Members Present: Physician leading conference: Dr. 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   Improve mobility, BP, DM, thrombocytosis, UTI  See above   Bowel/Bladder   continent x2, LBM 06/01/17  to remain continent x2 with min/mod assist with devices  continue plan of  care   Swallow/Nutrition/ Hydration             ADL's   min guard with mobility with and without RW, supervision dressing  Supervision overall  activity tolerance, pain management, ADL retraining, d/c planning   Mobility   min guard bed mobility, min guard transfers with and without AD, gait up to 310ft with RW, decreased safety awareness  Supervision assist with LRAD for ambulation and trasnfers at household level  safety awareness, balance, endurance, pain amangement   Communication             Safety/Cognition/ Behavioral Observations  improving attention, not as distracted by pain, now RL VII-VIII  supervision   safety awareness   Pain   Pt c/o pain to L side with PRN pain medications, reports 8 of 10 with relief  <2  assess pain q shift and PRN; medicate as ordered   Skin   Adhesive strips to upper abdomen from splenectomy  to remain free of infection  assess skin qshift and PRN; encourage mobility      *See Care Plan and progress notes for long and short-term goals.     Barriers to Discharge  Current Status/Progress Possible Resolutions Date Resolved   Physician    Medical stability     See above  Therapies, follow labs, optimize HTN/DM meds, trauma to follow effusion, abx for UTI      Nursing  PT                    OT                  SLP                SW                Discharge Planning/Teaching Needs:  Plan to d/c home with family arranging and providing 24/7 support.  Teaching to be completed closer to d/c.   Team Discussion:  MD reports known pleural effusion and will be followed as OP.  UTI - on abx.  Much improved with PT and at CG/ supervision with walker.  Better safety awareness overall.  Good cognitive gains.  On track to meet supervision goals.  Revisions to Treatment Plan:  None    Continued Need for Acute Rehabilitation Level of Care: The patient requires daily medical management by a physician with specialized training in  physical medicine and rehabilitation for the following conditions: Daily direction of a multidisciplinary physical rehabilitation program to ensure safe treatment while eliciting the highest outcome that is of practical value to the patient.: Yes Daily medical management of patient stability for increased activity during participation in an intensive rehabilitation regime.: Yes Daily analysis of laboratory values and/or radiology reports with any subsequent need for medication adjustment of medical intervention for : Neurological problems;Blood pressure problems;Diabetes problems;Pulmonary problems;Urological problems  Merle Whitehorn 06/06/2017, 12:35 PM

## 2017-06-06 NOTE — Progress Notes (Addendum)
Social Work  Discharge Note  The overall goal for the admission was met for:   Discharge location: Yes - pt to d/c to his own home with family providing 24/7 supervision  Length of Stay: Yes - 11 days (with d/c on 3/30)  Discharge activity level: Yes - supervision  Home/community participation: Yes  Services provided included: MD, RD, PT, OT, SLP, RN, TR, Pharmacy and [place]: Medicare  Follow-up services arranged: Home Health: PT via [facility], DME: walker via [facility] and Patient/Family has no preference for HH/DME agencies  Comments (or additional information):  Patient/Family verbalized understanding of follow-up arrangements: Yes  Individual responsible for coordination of the follow-up plan: pt  Confirmed correct DME delivered: Rozlyn Yerby 06/06/2017    Nou Chard

## 2017-06-06 NOTE — Progress Notes (Signed)
Speech Language Pathology Discharge Summary  Patient Details  Name: Malik [name] MRN: [id number] Date of Birth: 19-May-1945  Today's Date: 06/06/2017 SLP Individual Time: 0905-0955 SLP Individual Time Calculation (min): 50 min   Skilled Therapeutic Interventions:  Pt was seen for skilled ST targeting cognitive goals and grad day activities.  SLP facilitated the session by administering the MoCA-Basic to measure progress from initial evaluation.  Pt scored 20/30 on assessment with deficits most notable for storage and retrieval of information.  Reviewed and reinforced recommendations for further ST follow up at next level of care to continue to address cognitive function; however, suspect that pt is near his baseline at this point. Pt was left in wheelchair with chair alarm set, quick release belt donned, and call bell within reach.  Per report from [place], pt and family are still making arrangements for pt to have recommended level of care at discharge but is overall ready to discharge as long as he has 24/7 supervision available.        Patient has met 2 of 3 long term goals.  Patient to discharge at overall Supervision level.  Reasons goals not met:     Clinical Impression/Discharge Summary:  Pt has made excellent functional gains while inpatient and is discharging having met 2 out of 3 long term goals.  Pt is overall supervision for cognitive tasks due to mild cognitive impairment mostly related to decreased anticipatory awareness of his deficits and decreased storage and retrieval of information.  Pt is illiterate with very limited education; therefore, suspect that pt is near his baseline.  Pt education is complete at this time.  Pt is discharging home with 24/7 supervision from family.    Care Partner:  Caregiver Able to Provide Assistance: Yes  Type of Caregiver Assistance: Physical;Cognitive  Recommendation:  Home Health SLP;24 hour supervision/assistance  Rationale for SLP Follow  Up: Maximize cognitive function and independence   Equipment: none recommended by SLP    Reasons for discharge: Discharged from hospital   Patient/Family Agrees with Progress Made and Goals Achieved: Yes   Function:  Eating Eating     Eating Assist Level: No help, No cues           Cognition Comprehension Comprehension assist level: Follows basic conversation/direction with extra time/assistive device  Expression   Expression assist level: Expresses basic needs/ideas: With extra time/assistive device  Social Interaction Social Interaction assist level: Interacts appropriately 90% of the time - Needs monitoring or encouragement for participation or interaction.  Problem Solving Problem solving assist level: Solves basic problems with no assist  Memory Memory assist level: Recognizes or recalls 90% of the time/requires cueing < 10% of the time   Windell Moulding L 06/06/2017, 1:15 PM

## 2017-06-06 NOTE — Progress Notes (Signed)
Hypoglycemic Event  CBG: 61  Treatment: 15 GM carbohydrate snack and 15 GM gel  Symptoms: None  Follow-up CBG: Time:2155 CBG Result:82  Possible Reasons for Event: Unknown  Comments/MD notified:Snacks provided. Dr Letta Pate notified.    Jacquelynn Cree Jiovanny Burdell

## 2017-06-06 NOTE — Progress Notes (Signed)
Social Work Patient ID: Malik [name], male   DOB: 05/18/1945, 72 y.o.   MRN: [id number]    Rexene Alberts  Social Worker  General Practice  Patient Care Conference  Signed  Date of Service:  06/06/2017 12:34 PM          Signed          [] Hide copied text  [] Hover for details   Inpatient RehabilitationTeam Conference and Plan of Care Update Date: 06/04/2017   Time: 2:45 PM      Patient Name: Malik [name]      Medical Record Number: [id number]  Date of Birth: Apr 23, 1945 Sex: Male         Room/Bed: 4M01C/4M01C-01 Payor Info: Payor: MEDICARE / Plan: MEDICARE PART A AND B / Product Type: *No Product type* /     Admitting Diagnosis: Tauma Polytrauma  Admit Date/Time:  05/27/2017  7:07 PM Admission Comments: No comment available    Primary Diagnosis:  Trauma Principal Problem: Trauma       Patient Active Problem List    Diagnosis Date Noted  . Thrombocytosis ([place])    . Acute lower UTI    . Labile blood pressure    . Hydrothorax    . Sleep disturbance    . Pleural effusion on left    . Hypertensive crisis    . Hypertension    . Leukocytosis    . Benign essential HTN    . Diabetes mellitus type 2 in nonobese (HCC)    . Acute blood loss anemia    . Trauma 05/27/2017  . TBI (traumatic brain injury) ([place])    . Essential hypertension    . Chronic diastolic congestive heart failure ([place])    . 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 Rib fractures 05/10/2017  . 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      Expected Discharge Date: Expected Discharge Date: 06/07/17   Team Members Present: Physician leading conference: Dr. 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   Improve mobility, BP, DM, thrombocytosis, UTI  See above   Bowel/Bladder     continent x2, LBM 06/01/17  to remain continent x2 with min/mod assist with devices  continue plan of care   Swallow/Nutrition/ Hydration               ADL's     min guard with mobility with and without RW, supervision dressing  Supervision overall  activity tolerance, pain management, ADL retraining, d/c planning   Mobility     min guard bed mobility, min guard transfers with and without AD, gait up to 320ft with RW, decreased safety awareness  Supervision assist with LRAD for ambulation and trasnfers at household level  safety awareness, balance, endurance, pain amangement   Communication               Safety/Cognition/ Behavioral Observations   improving attention, not as distracted by pain, now RL VII-VIII  supervision   safety awareness   Pain     Pt c/o pain to L side with PRN pain medications,  reports 8 of 10 with relief  <2  assess pain q shift and PRN; medicate as ordered   Skin     Adhesive strips to upper abdomen from splenectomy  to remain free of infection  assess skin qshift and PRN; encourage mobility     *See Care Plan and progress notes for long and short-term goals.      Barriers to Discharge   Current Status/Progress Possible Resolutions Date Resolved   Physician     Medical stability     See above  Therapies, follow labs, optimize HTN/DM meds, trauma to follow effusion, abx for UTI      Nursing                 PT                    OT                 SLP            SW              Discharge Planning/Teaching Needs:  Plan to d/c home with family arranging and providing 24/7 support.  Teaching to be completed closer to d/c.   Team Discussion:  MD reports known pleural effusion and will be followed as OP.  UTI - on  abx.  Much improved with PT and at CG/ supervision with walker.  Better safety awareness overall.  Good cognitive gains.  On track to meet supervision goals.  Revisions to Treatment Plan:  None    Continued Need for Acute Rehabilitation Level of Care: The patient requires daily medical management by a physician with specialized training in physical medicine and rehabilitation for the following conditions: Daily direction of a multidisciplinary physical rehabilitation program to ensure safe treatment while eliciting the highest outcome that is of practical value to the patient.: Yes Daily medical management of patient stability for increased activity during participation in an intensive rehabilitation regime.: Yes Daily analysis of laboratory values and/or radiology reports with any subsequent need for medication adjustment of medical intervention for : Neurological problems;Blood pressure problems;Diabetes problems;Pulmonary problems;Urological problems   Tully Burgo 06/06/2017, 12:35 PM

## 2017-06-06 NOTE — Progress Notes (Signed)
Pt refuses lab. x2 attempts.

## 2017-06-06 NOTE — Progress Notes (Signed)
Physical Therapy Discharge Summary  Patient Details  Name: Malik [name] MRN: [id number] Date of Birth: Jan 20, 1946  Today's Date: 06/06/2017 PT Individual Time: 2111-7356 PT Individual Time Calculation (min): 70 min    Patient has met 10 of 10 long term goals due to improved activity tolerance, improved balance, improved postural control, increased strength, increased range of motion, decreased pain, ability to compensate for deficits, functional use of  right lower extremity and left lower extremity, improved attention, improved awareness and improved coordination.  Patient to discharge at an ambulatory level Supervision.   Patient's care partner is independent to provide the necessary physical assistance at discharge.  Reasons goals not met: All PT goals met.   Recommendation:  Patient will benefit from ongoing skilled PT services in home health setting to continue to advance safe functional mobility, address ongoing impairments in balance and safety, and minimize fall risk.   Equipment: RW  Reasons for discharge: treatment goals met and discharge from hospital  Patient/family agrees with progress made and goals achieved: Yes   PT Treatment;  Pt received sitting in WC and agreeable to PT. PT instructed pt in Grad day assessment to measure progress toward goals. See below for details. Patient returned to room and left sitting in [facility] with call bell in reach and all needs met. Pt educated by PT to have 25hr supervision upon D/c as well as use of RW to improve safety for all mobility within and outside home.     PT Discharge Precautions/Restrictions Restrictions Weight Bearing Restrictions: No   Pain Pain Assessment Pain Scale: Faces Pain Score: 0-No pain Faces Pain Scale: Hurts a little bit Pain Location: Back Pain Intervention(s): Repositioned Vision/Perception  Perception Perception: Within Functional Limits Praxis Praxis: Intact  Cognition Overall Cognitive Status:  Impaired/Different from baseline Arousal/Alertness: Awake/alert Orientation Level: Oriented to person;Oriented to situation;Oriented to place;Oriented to time Attention: Selective Selective Attention: Appears intact Memory: Impaired Memory Impairment: Decreased recall of new information Awareness: Impaired Awareness Impairment: Anticipatory impairment Problem Solving: Appears intact Safety/Judgment: Impaired Comments: mild baseline safety/judgement impairments.  [place] [employer] Scales of Cognitive Functioning: Purposeful/appropriate Sensation Sensation Light Touch: Appears Intact Proprioception: Appears Intact Coordination Gross Motor Movements are Fluid and Coordinated: Yes Fine Motor Movements are Fluid and Coordinated: Yes Finger Nose Finger Test: pain in the R shoulder limits speed. equal accuracy R and L.  Heel Shin Test: [facility] Motor  Motor Motor: Within Functional Limits Motor - Discharge Observations: greatly improved from Eval   Mobility Bed Mobility Bed Mobility: Rolling Left;Supine to Sit;Sit to Supine;Rolling Right Rolling Right: 6: Modified independent (Device/Increase time) Rolling Left: 6: Modified independent (Device/Increase time)(in pain free range) Supine to Sit: 6: Modified independent (Device/Increase time) Sit to Supine: 6: Modified independent (Device/Increase time) Transfers Transfers: Yes Sit to Stand: 6: Modified independent (Device/Increase time) Sit to Stand Details (indicate cue type and reason): no AD Stand Pivot Transfers: 6: Modified independent (Device/Increase time) Stand Pivot Transfer Details (indicate cue type and reason): no AD Locomotion  Ambulation Ambulation/Gait Assistance: 5: Supervision Ambulation Distance (Feet): 150 Feet Assistive device: None Ambulation/Gait Assistance Details: Verbal cues for precautions/safety;Verbal cues for gait pattern Ambulation/Gait Assistance Details: Unlevel surface: supervision assist from PT 58f x  2. min cues for safety.  Gait Gait: Yes Gait Pattern: Impaired Gait Pattern: Narrow base of support;Antalgic Stairs / Additional Locomotion Stairs: Yes Stairs Assistance: 5: Supervision Stairs Assistance Details: Verbal cues for gait pattern;Verbal cues for precautions/safety Stair Management Technique: Two rails Number of Stairs: 12 Height of Stairs:  6 Wheelchair Mobility Wheelchair Mobility: No  Trunk/Postural Assessment  Cervical Assessment Cervical Assessment: Within Functional Limits Thoracic Assessment Thoracic Assessment: Exceptions to WFL(pain in thorax ) Lumbar Assessment Lumbar Assessment: Within Functional Limits Postural Control Postural Control: Within Functional Limits  Balance Dynamic Sitting Balance Dynamic Sitting - Level of Assistance: 6: Modified independent (Device/Increase time) Static Standing Balance Static Standing - Level of Assistance: 6: Modified independent (Device/Increase time) Dynamic Standing Balance Dynamic Standing - Level of Assistance: 5: Stand by assistance Extremity Assessment      RLE Assessment RLE Assessment: Within Functional Limits RLE Strength RLE Overall Strength Comments: grossly 5/5 through functional movement and MMT  LLE Assessment LLE Assessment: Within Functional Limits LLE Strength LLE Overall Strength Comments: grossly 5/5 through functional movement and MMT    See Function Navigator for Current Functional Status.  Malik [name] 06/06/2017, 1:37 PM

## 2017-06-06 NOTE — Progress Notes (Signed)
Occupational Therapy Session Note  Patient Details  Name: Malik [name] MRN: [id number] Date of Birth: 1945-11-06  Today's Date: 06/06/2017 OT Individual Time: 1445-1530 OT Individual Time Calculation (min): 45 min Make up time   Short Term Goals: Week 1:  OT Short Term Goal 1 (Week 1): Pt will bathe at shower level with min guard OT Short Term Goal 2 (Week 1): Pt will transfer to toilet with supervision  OT Short Term Goal 3 (Week 1): Pt will perform standing task with supervison for ~5 min before rest break  Skilled Therapeutic Interventions/Progress Updates:    Treatment session with focus on activity tolerance and dynamic standing balance.  Pt received upright in w/c reporting enjoyment of going outside during PT session. Engaged in Wii balance activities with focus on weight shifting and balance reactions.  Pt required min guard when stepping up on to Wii balance board without UE support.  Engaged in weight shifting activities with close supervision, pt demonstrating improved activity tolerance and balance despite reports of discomfort in ribs. Discussed d/c plan with pt reporting ready for d/c home tomorrow.  Therapy Documentation Precautions:  Precautions Precautions: Fall Restrictions Weight Bearing Restrictions: No General:   Vital Signs: Therapy Vitals Temp: 98 F (36.7 C) Temp Source: Oral Pulse Rate: 70 Resp: 18 BP: 132/64 Patient Position (if appropriate): Sitting Oxygen Therapy SpO2: 100 % O2 Device: Room Air Pain: Pain Assessment Pain Scale: Faces Faces Pain Scale: Hurts a little bit Pain Location: Back and Ribs with activity Pain Intervention(s): Repositioned  See Function Navigator for Current Functional Status.   Therapy/Group: Individual Therapy  [name] 06/06/2017, 4:04 PM

## 2017-06-06 NOTE — Progress Notes (Signed)
Occupational Therapy Session Note  Patient Details  Name: Malik [name] MRN: [id number] Date of Birth: [birth year]  Today's Date: 06/06/2017 OT Individual Time: 1001-1053 OT Individual Time Calculation (min): 52 min    Short Term Goals: Week 1:  OT Short Term Goal 1 (Week 1): Pt will bathe at shower level with min guard OT Short Term Goal 2 (Week 1): Pt will transfer to toilet with supervision  OT Short Term Goal 3 (Week 1): Pt will perform standing task with supervison for ~5 min before rest break  Skilled Therapeutic Interventions/Progress Updates:    Pt received supine in bed with no c/o pain at rest and agreeable to therapy. Pt completed bed mobility with (S). Pt ambulated into bathroom with RW, requiring vc for safety precautions and safe RW management. Pt doffed clothing at standing level in shower with (S). Pt completed full body bathing in standing with (S). Pt provided with seat and encouraged to complete LB dressing seated for fall risk reduction and provided with edu re activity pacing. Pt donned all clothing from seated level with (S) overall. Pt transferred into w/c and propelled within his room to sink to complete seated level shaving task. Pt then provided with bags and completed item retrieval to pack up belongings for d/c. Extensive education provided re d/c planning and safety in the home. Pt ambulated 396f to laundry room and completed simulated functional reaching into washer/dryer. Pt ambulated 559fwithout RW before requesting to use RW for remaining 25076f/t pain in ribs. Pt left seated in recliner with quick release belt donned and all needs met.   Therapy Documentation Precautions:  Precautions Precautions: Fall Restrictions Weight Bearing Restrictions: No Pain: Pain Assessment Pain Scale: 0-10 Pain Score: 0-No pain Faces Pain Scale: No hurt Pain Type: Acute pain Pain Location: Back Pain Descriptors / Indicators: Aching Patients Stated Pain Goal: 3 Pain  Intervention(s): Medication (See eMAR) ADL: ADL ADL Comments: see functional navigator  See Function Navigator for Current Functional Status.   Therapy/Group: Individual Therapy  [place] Sites29/2019, 11:40 AM

## 2017-06-07 LAB — CBC WITH DIFFERENTIAL/PLATELET
BASOS ABS: 0.1 10*3/uL (ref 0.0–0.1)
Basophils Relative: 1 %
Eosinophils Absolute: 0.4 10*3/uL (ref 0.0–0.7)
Eosinophils Relative: 4 %
HEMATOCRIT: 30.5 % — AB (ref 39.0–52.0)
Hemoglobin: 9.2 g/dL — ABNORMAL LOW (ref 13.0–17.0)
LYMPHS PCT: 21 %
Lymphs Abs: 2.1 10*3/uL (ref 0.7–4.0)
MCH: 23.4 pg — ABNORMAL LOW (ref 26.0–34.0)
MCHC: 30.2 g/dL (ref 30.0–36.0)
MCV: 77.4 fL — AB (ref 78.0–100.0)
MONO ABS: 0.7 10*3/uL (ref 0.1–1.0)
Monocytes Relative: 7 %
NEUTROS ABS: 6.6 10*3/uL (ref 1.7–7.7)
Neutrophils Relative %: 67 %
Platelets: 726 10*3/uL — ABNORMAL HIGH (ref 150–400)
RBC: 3.94 MIL/uL — ABNORMAL LOW (ref 4.22–5.81)
RDW: 19.4 % — AB (ref 11.5–15.5)
WBC: 9.8 10*3/uL (ref 4.0–10.5)

## 2017-06-07 LAB — GLUCOSE, CAPILLARY: GLUCOSE-CAPILLARY: 114 mg/dL — AB (ref 65–99)

## 2017-06-07 NOTE — Progress Notes (Signed)
Patient discharged at 55 with all belongings and family to home. Patient given discharge instructions via Reesa Chew PA yesterday. Instructions went over again with patient's family. All questions answered.
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
## 2017-06-09 ENCOUNTER — Telehealth: Payer: Self-pay | Admitting: Registered Nurse

## 2017-06-09 NOTE — Telephone Encounter (Signed)
Transitional Care call Transitional Care Call Completed, Appointment Confirmed, Address Confirmed, New Patient Packet Mail  Patient name: Malik [name]  DOB: Feb 16, 1946 1. Are you/is patient experiencing any problems since coming home? No a. Are there any questions regarding any aspect of care? No 2. Are there any questions regarding medications administration/dosing? No a. Are meds being taken as prescribed? Yes b. "Patient should review meds with caller to confirm" Medication List Reviewed.  3. Have there been any falls? No 4. Has Home Health been to the house and/or have they contacted you? Yes, [facility] a. If not, have you tried to contact them? NA b. Can we help you contact them? No 5. Are bowels and bladder emptying properly? Yes a. Are there any unexpected incontinence issues? No b. If applicable, is patient following bowel/bladder programs? NA 6. Any fevers, problems with breathing, unexpected pain? No 7. Are there any skin problems or new areas of breakdown? No 8. Has the patient/family member arranged specialty MD follow up (ie cardiology/neurology/renal/surgical/etc.)?  Yes, he has to make a call to his PCP, he reports his son will be calling.  a. Can we help arrange? No 9. Does the patient need any other services or support that we can help arrange? No 10. Are caregivers following through as expected in assisting the patient? Yes 11. Has the patient quit smoking, drinking alcohol, or using drugs as recommended? [name] denies smoking, drinking alcohol or using illicit drugs.   Appointment date/time 06/16/2017 arrival time 10:00 for 10:20 appointment, with Danella Sensing ANP-C at [facility] [place]

## 2017-06-09 NOTE — Progress Notes (Signed)
Occupational Therapy Discharge Summary  Patient Details  Name: Malik [name] MRN: [id number] Date of Birth: 08-Feb-1946  Patient has met 13 of 11 long term goals due to improved activity tolerance, improved balance, postural control, ability to compensate for deficits, improved attention and improved awareness.  Patient to discharge at overall Supervision level.  Patient's care partner is independent to provide the necessary assistance at discharge.  Family plans to provide 24/7 supervision at discharge.  Reasons goals not met: N/A  Recommendation:  Patient will not require follow up OT at this time, however plans to receive follow up [place] for balance and endurance.  Equipment: No equipment provided  Reasons for discharge: treatment goals met and discharge from hospital  Patient/family agrees with progress made and goals achieved: Yes    See Function Navigator for Current Functional Status.  [name] 06/09/2017, 7:39 AM

## 2017-06-11 ENCOUNTER — Telehealth: Payer: Self-pay

## 2017-06-11 NOTE — Telephone Encounter (Signed)
Lynn Collins-PT [facility] called to ask for approval for orders for PT and for MSW visit for the following: twice a week for 4 weeks. Called Jeani Hawking back with the approval orders for plan of care requested.

## 2017-06-16 ENCOUNTER — Encounter: Payer: Medicare Other | Attending: Registered Nurse | Admitting: Registered Nurse

## 2017-06-17 ENCOUNTER — Encounter: Admitting: Registered Nurse

## 2017-06-18 ENCOUNTER — Telehealth: Payer: Self-pay

## 2017-06-18 NOTE — Telephone Encounter (Signed)
Elder Cyphers PT [facility] called requesting verbal orders for a home health RN to go out to patient for Diabetes management and training for the patient.  Called number back and approved verbal orders.

## 2017-06-26 DIAGNOSIS — Z9081 Acquired absence of spleen: Secondary | ICD-10-CM | POA: Diagnosis not present

## 2017-06-26 DIAGNOSIS — S2242XD Multiple fractures of ribs, left side, subsequent encounter for fracture with routine healing: Secondary | ICD-10-CM | POA: Diagnosis not present

## 2017-06-26 DIAGNOSIS — R262 Difficulty in walking, not elsewhere classified: Secondary | ICD-10-CM | POA: Diagnosis not present

## 2017-06-26 DIAGNOSIS — I110 Hypertensive heart disease with heart failure: Secondary | ICD-10-CM | POA: Diagnosis not present

## 2017-06-26 DIAGNOSIS — I340 Nonrheumatic mitral (valve) insufficiency: Secondary | ICD-10-CM | POA: Diagnosis not present

## 2017-06-26 DIAGNOSIS — J90 Pleural effusion, not elsewhere classified: Secondary | ICD-10-CM | POA: Diagnosis not present

## 2017-06-26 DIAGNOSIS — S069X9D Unspecified intracranial injury with loss of consciousness of unspecified duration, subsequent encounter: Secondary | ICD-10-CM | POA: Diagnosis not present

## 2017-06-26 DIAGNOSIS — I420 Dilated cardiomyopathy: Secondary | ICD-10-CM | POA: Diagnosis not present

## 2017-06-26 DIAGNOSIS — E1140 Type 2 diabetes mellitus with diabetic neuropathy, unspecified: Secondary | ICD-10-CM | POA: Diagnosis not present

## 2017-06-26 DIAGNOSIS — D473 Essential (hemorrhagic) thrombocythemia: Secondary | ICD-10-CM | POA: Diagnosis not present

## 2017-06-26 DIAGNOSIS — I5032 Chronic diastolic (congestive) heart failure: Secondary | ICD-10-CM | POA: Diagnosis not present

## 2017-11-21 ENCOUNTER — Other Ambulatory Visit: Payer: Self-pay

## 2017-11-21 DIAGNOSIS — R195 Other fecal abnormalities: Secondary | ICD-10-CM

## 2017-12-08 ENCOUNTER — Ambulatory Visit
Admission: RE | Admit: 2017-12-08 | Discharge: 2017-12-08 | Disposition: A | Discharge status: Home / Self Care | Payer: Medicare Other | Source: Ambulatory Visit | Attending: Gastroenterology | Admitting: Gastroenterology

## 2017-12-08 ENCOUNTER — Encounter
Admission: RE | Disposition: A | Discharge status: Home / Self Care | Payer: Self-pay | Source: Ambulatory Visit | Attending: Gastroenterology

## 2017-12-08 ENCOUNTER — Encounter: Payer: Self-pay | Admitting: *Deleted

## 2017-12-08 DIAGNOSIS — Z5309 Procedure and treatment not carried out because of other contraindication: Secondary | ICD-10-CM | POA: Insufficient documentation

## 2017-12-08 DIAGNOSIS — Z1211 Encounter for screening for malignant neoplasm of colon: Secondary | ICD-10-CM | POA: Insufficient documentation

## 2017-12-08 LAB — GLUCOSE, CAPILLARY: Glucose-Capillary: 114 mg/dL — ABNORMAL HIGH (ref 70–99)

## 2017-12-08 SURGERY — COLONOSCOPY WITH PROPOFOL
Anesthesia: General

## 2017-12-08 NOTE — OR Nursing (Signed)
Patient states he did not take a colon prep. Dr. Vicente Males notified. Procedure cancelled and to reschedule. Patient discharged via ambulation.

## 2017-12-30 ENCOUNTER — Other Ambulatory Visit: Payer: Self-pay

## 2017-12-30 DIAGNOSIS — R195 Other fecal abnormalities: Secondary | ICD-10-CM

## 2017-12-30 MED ORDER — NA SULFATE-K SULFATE-MG SULF 17.5-3.13-1.6 GM/177ML PO SOLN: 1.0000 | Freq: Once | ORAL | 0 refills | Status: AC

## 2018-01-09 ENCOUNTER — Telehealth: Payer: Self-pay

## 2018-01-09 NOTE — Telephone Encounter (Signed)
Patient contacted office to rescheduled his colonoscopy from 01/12/18 due to court.  I explained to him that the cancellation fee still applies because when we rescheduled Septembers colonoscopy he informed me that 11/04 would work for him because he was working it around court date.  He was aware of court date and waited til today to call.  He said he would not pay the cancellation/reschedule fee.  I explained to him that I will not schedule his colonoscopy if he declines to pay the fee.  He requested to cancel all together.  Thanks [employer]

## 2018-01-12 ENCOUNTER — Encounter: Admission: RE | Payer: Self-pay | Source: Ambulatory Visit

## 2018-01-12 ENCOUNTER — Ambulatory Visit: Admission: RE | Admit: 2018-01-12 | Payer: Medicare Other | Source: Ambulatory Visit | Admitting: Gastroenterology

## 2018-01-12 SURGERY — COLONOSCOPY WITH PROPOFOL
Anesthesia: General

## 2018-08-31 DIAGNOSIS — E119 Type 2 diabetes mellitus without complications: Secondary | ICD-10-CM | POA: Diagnosis not present

## 2018-08-31 DIAGNOSIS — I10 Essential (primary) hypertension: Secondary | ICD-10-CM | POA: Diagnosis not present

## 2018-09-15 DIAGNOSIS — I10 Essential (primary) hypertension: Secondary | ICD-10-CM | POA: Diagnosis not present

## 2018-09-15 DIAGNOSIS — E119 Type 2 diabetes mellitus without complications: Secondary | ICD-10-CM | POA: Diagnosis not present

## 2018-11-05 DIAGNOSIS — I10 Essential (primary) hypertension: Secondary | ICD-10-CM | POA: Diagnosis not present

## 2018-11-05 DIAGNOSIS — E119 Type 2 diabetes mellitus without complications: Secondary | ICD-10-CM | POA: Diagnosis not present

## 2018-11-18 ENCOUNTER — Other Ambulatory Visit: Payer: Self-pay

## 2018-11-18 NOTE — Patient Outreach (Signed)
[facility]) Care Management  11/18/2018  Malik [name] 07/08/1945 [id number]   Medication Adherence call to [name] patient did not answer and voice mail is not set up [name] is showing past due on Metformin 500 mg,Atorvastatin 40 mg,Losartan 25 mg,Glipizide XL 10 mg under Malik [name].   [facility] Management Direct Dial [phone]  Fax [phone] [name]

## 2018-12-02 DIAGNOSIS — I10 Essential (primary) hypertension: Secondary | ICD-10-CM | POA: Diagnosis not present

## 2018-12-02 DIAGNOSIS — E119 Type 2 diabetes mellitus without complications: Secondary | ICD-10-CM | POA: Diagnosis not present
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
## 2019-08-10 ENCOUNTER — Inpatient Hospital Stay (HOSPITAL_COMMUNITY): Payer: Medicare Other | Admitting: Certified Registered Nurse Anesthetist

## 2019-08-10 ENCOUNTER — Emergency Department (HOSPITAL_COMMUNITY): Payer: Medicare Other

## 2019-08-10 ENCOUNTER — Inpatient Hospital Stay (HOSPITAL_COMMUNITY): Payer: Medicare Other

## 2019-08-10 ENCOUNTER — Encounter (HOSPITAL_COMMUNITY)
Admission: EM | Disposition: A | Discharge status: Skilled Nursing Facility | Payer: Self-pay | Source: Home / Self Care | Attending: Neurology

## 2019-08-10 ENCOUNTER — Inpatient Hospital Stay (HOSPITAL_COMMUNITY)
Admission: EM | Admit: 2019-08-10 | Discharge: 2019-08-24 | DRG: 023 | Disposition: A | Discharge status: Skilled Nursing Facility | Payer: Medicare Other | Attending: Neurology | Admitting: Neurology

## 2019-08-10 ENCOUNTER — Encounter (HOSPITAL_COMMUNITY): Payer: Self-pay | Admitting: Certified Registered"

## 2019-08-10 DIAGNOSIS — F1010 Alcohol abuse, uncomplicated: Secondary | ICD-10-CM | POA: Diagnosis not present

## 2019-08-10 DIAGNOSIS — Z20822 Contact with and (suspected) exposure to covid-19: Secondary | ICD-10-CM | POA: Diagnosis present

## 2019-08-10 DIAGNOSIS — G8191 Hemiplegia, unspecified affecting right dominant side: Secondary | ICD-10-CM | POA: Diagnosis present

## 2019-08-10 DIAGNOSIS — R5383 Other fatigue: Secondary | ICD-10-CM

## 2019-08-10 DIAGNOSIS — E1122 Type 2 diabetes mellitus with diabetic chronic kidney disease: Secondary | ICD-10-CM | POA: Diagnosis present

## 2019-08-10 DIAGNOSIS — E669 Obesity, unspecified: Secondary | ICD-10-CM | POA: Diagnosis present

## 2019-08-10 DIAGNOSIS — Z87891 Personal history of nicotine dependence: Secondary | ICD-10-CM

## 2019-08-10 DIAGNOSIS — E1140 Type 2 diabetes mellitus with diabetic neuropathy, unspecified: Secondary | ICD-10-CM | POA: Diagnosis present

## 2019-08-10 DIAGNOSIS — Z9282 Status post administration of tPA (rtPA) in a different facility within the last 24 hours prior to admission to current facility: Secondary | ICD-10-CM | POA: Diagnosis not present

## 2019-08-10 DIAGNOSIS — L89892 Pressure ulcer of other site, stage 2: Secondary | ICD-10-CM | POA: Diagnosis present

## 2019-08-10 DIAGNOSIS — N179 Acute kidney failure, unspecified: Secondary | ICD-10-CM | POA: Diagnosis present

## 2019-08-10 DIAGNOSIS — N1832 Chronic kidney disease, stage 3b: Secondary | ICD-10-CM | POA: Diagnosis not present

## 2019-08-10 DIAGNOSIS — D509 Iron deficiency anemia, unspecified: Secondary | ICD-10-CM | POA: Diagnosis not present

## 2019-08-10 DIAGNOSIS — R509 Fever, unspecified: Secondary | ICD-10-CM

## 2019-08-10 DIAGNOSIS — Z7984 Long term (current) use of oral hypoglycemic drugs: Secondary | ICD-10-CM

## 2019-08-10 DIAGNOSIS — R29725 NIHSS score 25: Secondary | ICD-10-CM | POA: Diagnosis present

## 2019-08-10 DIAGNOSIS — G936 Cerebral edema: Secondary | ICD-10-CM | POA: Diagnosis present

## 2019-08-10 DIAGNOSIS — N183 Chronic kidney disease, stage 3 unspecified: Secondary | ICD-10-CM | POA: Diagnosis present

## 2019-08-10 DIAGNOSIS — I63412 Cerebral infarction due to embolism of left middle cerebral artery: Secondary | ICD-10-CM | POA: Diagnosis present

## 2019-08-10 DIAGNOSIS — E119 Type 2 diabetes mellitus without complications: Secondary | ICD-10-CM

## 2019-08-10 DIAGNOSIS — Z9114 Patient's other noncompliance with medication regimen: Secondary | ICD-10-CM

## 2019-08-10 DIAGNOSIS — E785 Hyperlipidemia, unspecified: Secondary | ICD-10-CM | POA: Diagnosis not present

## 2019-08-10 DIAGNOSIS — I10 Essential (primary) hypertension: Secondary | ICD-10-CM | POA: Diagnosis not present

## 2019-08-10 DIAGNOSIS — I63511 Cerebral infarction due to unspecified occlusion or stenosis of right middle cerebral artery: Secondary | ICD-10-CM | POA: Diagnosis not present

## 2019-08-10 DIAGNOSIS — L8990 Pressure ulcer of unspecified site, unspecified stage: Secondary | ICD-10-CM | POA: Diagnosis not present

## 2019-08-10 DIAGNOSIS — R4701 Aphasia: Secondary | ICD-10-CM | POA: Diagnosis present

## 2019-08-10 DIAGNOSIS — I69391 Dysphagia following cerebral infarction: Secondary | ICD-10-CM | POA: Diagnosis not present

## 2019-08-10 DIAGNOSIS — I130 Hypertensive heart and chronic kidney disease with heart failure and stage 1 through stage 4 chronic kidney disease, or unspecified chronic kidney disease: Secondary | ICD-10-CM | POA: Diagnosis present

## 2019-08-10 DIAGNOSIS — E1159 Type 2 diabetes mellitus with other circulatory complications: Secondary | ICD-10-CM | POA: Diagnosis not present

## 2019-08-10 DIAGNOSIS — N1831 Chronic kidney disease, stage 3a: Secondary | ICD-10-CM | POA: Diagnosis present

## 2019-08-10 DIAGNOSIS — E042 Nontoxic multinodular goiter: Secondary | ICD-10-CM | POA: Diagnosis present

## 2019-08-10 DIAGNOSIS — E43 Unspecified severe protein-calorie malnutrition: Secondary | ICD-10-CM | POA: Diagnosis present

## 2019-08-10 DIAGNOSIS — I5032 Chronic diastolic (congestive) heart failure: Secondary | ICD-10-CM | POA: Diagnosis present

## 2019-08-10 DIAGNOSIS — R1312 Dysphagia, oropharyngeal phase: Secondary | ICD-10-CM | POA: Diagnosis not present

## 2019-08-10 DIAGNOSIS — D72829 Elevated white blood cell count, unspecified: Secondary | ICD-10-CM | POA: Diagnosis present

## 2019-08-10 DIAGNOSIS — R414 Neurologic neglect syndrome: Secondary | ICD-10-CM | POA: Diagnosis present

## 2019-08-10 DIAGNOSIS — Z6830 Body mass index (BMI) 30.0-30.9, adult: Secondary | ICD-10-CM | POA: Diagnosis not present

## 2019-08-10 DIAGNOSIS — Z87820 Personal history of traumatic brain injury: Secondary | ICD-10-CM | POA: Diagnosis not present

## 2019-08-10 DIAGNOSIS — I420 Dilated cardiomyopathy: Secondary | ICD-10-CM | POA: Diagnosis present

## 2019-08-10 DIAGNOSIS — S069XAA Unspecified intracranial injury with loss of consciousness status unknown, initial encounter: Secondary | ICD-10-CM | POA: Diagnosis present

## 2019-08-10 DIAGNOSIS — I639 Cerebral infarction, unspecified: Secondary | ICD-10-CM | POA: Diagnosis not present

## 2019-08-10 DIAGNOSIS — Z8249 Family history of ischemic heart disease and other diseases of the circulatory system: Secondary | ICD-10-CM

## 2019-08-10 DIAGNOSIS — G4733 Obstructive sleep apnea (adult) (pediatric): Secondary | ICD-10-CM | POA: Diagnosis present

## 2019-08-10 DIAGNOSIS — I169 Hypertensive crisis, unspecified: Secondary | ICD-10-CM | POA: Diagnosis present

## 2019-08-10 DIAGNOSIS — I161 Hypertensive emergency: Secondary | ICD-10-CM | POA: Diagnosis not present

## 2019-08-10 DIAGNOSIS — I4891 Unspecified atrial fibrillation: Secondary | ICD-10-CM | POA: Diagnosis present

## 2019-08-10 DIAGNOSIS — T17908A Unspecified foreign body in respiratory tract, part unspecified causing other injury, initial encounter: Secondary | ICD-10-CM

## 2019-08-10 DIAGNOSIS — I340 Nonrheumatic mitral (valve) insufficiency: Secondary | ICD-10-CM | POA: Diagnosis not present

## 2019-08-10 DIAGNOSIS — E7800 Pure hypercholesterolemia, unspecified: Secondary | ICD-10-CM | POA: Diagnosis not present

## 2019-08-10 DIAGNOSIS — D508 Other iron deficiency anemias: Secondary | ICD-10-CM | POA: Diagnosis not present

## 2019-08-10 DIAGNOSIS — Z9081 Acquired absence of spleen: Secondary | ICD-10-CM

## 2019-08-10 DIAGNOSIS — I63522 Cerebral infarction due to unspecified occlusion or stenosis of left anterior cerebral artery: Secondary | ICD-10-CM | POA: Diagnosis present

## 2019-08-10 DIAGNOSIS — Z79899 Other long term (current) drug therapy: Secondary | ICD-10-CM

## 2019-08-10 HISTORY — PX: RADIOLOGY WITH ANESTHESIA: SHX6223

## 2019-08-10 HISTORY — PX: IR CT HEAD LTD: IMG2386

## 2019-08-10 HISTORY — DX: Cerebral infarction, unspecified: I63.9

## 2019-08-10 HISTORY — PX: IR PERCUTANEOUS ART THROMBECTOMY/INFUSION INTRACRANIAL INC DIAG ANGIO: IMG6087

## 2019-08-10 LAB — PROTIME-INR
INR: 1.1 (ref 0.8–1.2)
Prothrombin Time: 13.9 seconds (ref 11.4–15.2)

## 2019-08-10 LAB — DIFFERENTIAL
Abs Immature Granulocytes: 0 10*3/uL (ref 0.00–0.07)
Basophils Absolute: 0.4 10*3/uL — ABNORMAL HIGH (ref 0.0–0.1)
Basophils Relative: 3 %
Eosinophils Absolute: 0.5 10*3/uL (ref 0.0–0.5)
Eosinophils Relative: 4 %
Lymphocytes Relative: 39 %
Lymphs Abs: 4.8 10*3/uL — ABNORMAL HIGH (ref 0.7–4.0)
Monocytes Absolute: 0.5 10*3/uL (ref 0.1–1.0)
Monocytes Relative: 4 %
Neutro Abs: 6.1 10*3/uL (ref 1.7–7.7)
Neutrophils Relative %: 50 %
nRBC: 0 /100 WBC

## 2019-08-10 LAB — CBC
HCT: 30.5 % — ABNORMAL LOW (ref 39.0–52.0)
Hemoglobin: 8.9 g/dL — ABNORMAL LOW (ref 13.0–17.0)
MCH: 20.5 pg — ABNORMAL LOW (ref 26.0–34.0)
MCHC: 29.2 g/dL — ABNORMAL LOW (ref 30.0–36.0)
MCV: 70.1 fL — ABNORMAL LOW (ref 80.0–100.0)
Platelets: 466 10*3/uL — ABNORMAL HIGH (ref 150–400)
RBC: 4.35 MIL/uL (ref 4.22–5.81)
RDW: 21.2 % — ABNORMAL HIGH (ref 11.5–15.5)
WBC: 12.2 10*3/uL — ABNORMAL HIGH (ref 4.0–10.5)
nRBC: 0.2 % (ref 0.0–0.2)

## 2019-08-10 LAB — COMPREHENSIVE METABOLIC PANEL
ALT: 23 U/L (ref 0–44)
AST: 28 U/L (ref 15–41)
Albumin: 3.3 g/dL — ABNORMAL LOW (ref 3.5–5.0)
Alkaline Phosphatase: 80 U/L (ref 38–126)
Anion gap: 12 (ref 5–15)
BUN: 9 mg/dL (ref 8–23)
CO2: 23 mmol/L (ref 22–32)
Calcium: 8.6 mg/dL — ABNORMAL LOW (ref 8.9–10.3)
Chloride: 108 mmol/L (ref 98–111)
Creatinine, Ser: 1.34 mg/dL — ABNORMAL HIGH (ref 0.61–1.24)
GFR calc Af Amer: 60 mL/min — ABNORMAL LOW (ref >60)
GFR calc non Af Amer: 51 mL/min — ABNORMAL LOW (ref >60)
Glucose, Bld: 131 mg/dL — ABNORMAL HIGH (ref 70–99)
Potassium: 3.3 mmol/L — ABNORMAL LOW (ref 3.5–5.1)
Sodium: 143 mmol/L (ref 135–145)
Total Bilirubin: 0.8 mg/dL (ref 0.3–1.2)
Total Protein: 7.3 g/dL (ref 6.5–8.1)

## 2019-08-10 LAB — SARS CORONAVIRUS 2 BY RT PCR (HOSPITAL ORDER, PERFORMED IN ~~LOC~~ HOSPITAL LAB): SARS Coronavirus 2: NEGATIVE

## 2019-08-10 LAB — APTT: aPTT: 28 seconds (ref 24–36)

## 2019-08-10 LAB — ETHANOL: Alcohol, Ethyl (B): 46 mg/dL — ABNORMAL HIGH (ref <10)

## 2019-08-10 SURGERY — IR WITH ANESTHESIA
Anesthesia: General

## 2019-08-10 MED ORDER — ROCURONIUM BROMIDE 10 MG/ML (PF) SYRINGE
PREFILLED_SYRINGE | INTRAVENOUS | Status: DC | PRN
  Administered 2019-08-10: 10 mg via INTRAVENOUS
  Administered 2019-08-10: 40 mg via INTRAVENOUS

## 2019-08-10 MED ORDER — STROKE: EARLY STAGES OF RECOVERY BOOK
Freq: Once | Status: DC
  Filled 2019-08-10: qty 1

## 2019-08-10 MED ORDER — IOHEXOL 240 MG/ML SOLN
150.0000 mL | Freq: Once | INTRAMUSCULAR | Status: AC | PRN
  Administered 2019-08-10: 50 mL via INTRA_ARTERIAL

## 2019-08-10 MED ORDER — PROPOFOL 10 MG/ML IV BOLUS
INTRAVENOUS | Status: DC | PRN
  Administered 2019-08-10: 80 mg via INTRAVENOUS
  Administered 2019-08-10: 120 mg via INTRAVENOUS

## 2019-08-10 MED ORDER — LABETALOL HCL 5 MG/ML IV SOLN
20.0000 mg | Freq: Once | INTRAVENOUS | Status: AC
  Administered 2019-08-10: 20 mg via INTRAVENOUS

## 2019-08-10 MED ORDER — SUCCINYLCHOLINE CHLORIDE 200 MG/10ML IV SOSY
PREFILLED_SYRINGE | INTRAVENOUS | Status: DC | PRN
  Administered 2019-08-10: 140 mg via INTRAVENOUS

## 2019-08-10 MED ORDER — SODIUM CHLORIDE 0.9 % IV SOLN
50.0000 mL | Freq: Once | INTRAVENOUS | Status: AC
  Administered 2019-08-10: 50 mL via INTRAVENOUS

## 2019-08-10 MED ORDER — TICAGRELOR 90 MG PO TABS
ORAL_TABLET | ORAL | Status: AC
  Filled 2019-08-10: qty 2

## 2019-08-10 MED ORDER — SODIUM CHLORIDE 0.9 % IV SOLN: INTRAVENOUS | Status: DC

## 2019-08-10 MED ORDER — ACETAMINOPHEN 650 MG RE SUPP: 650.0000 mg | RECTAL | Status: DC | PRN

## 2019-08-10 MED ORDER — CHLORHEXIDINE GLUCONATE CLOTH 2 % EX PADS
6.0000 | MEDICATED_PAD | Freq: Every day | CUTANEOUS | Status: DC
  Administered 2019-08-11 – 2019-08-14 (×4): 6 via TOPICAL

## 2019-08-10 MED ORDER — VERAPAMIL HCL 2.5 MG/ML IV SOLN
INTRAVENOUS | Status: AC
  Filled 2019-08-10: qty 2

## 2019-08-10 MED ORDER — LABETALOL HCL 5 MG/ML IV SOLN
INTRAVENOUS | Status: AC
  Filled 2019-08-10: qty 4

## 2019-08-10 MED ORDER — TIROFIBAN HCL IN NACL 5-0.9 MG/100ML-% IV SOLN
INTRAVENOUS | Status: AC
  Filled 2019-08-10: qty 100

## 2019-08-10 MED ORDER — CLOPIDOGREL BISULFATE 300 MG PO TABS
ORAL_TABLET | ORAL | Status: AC
  Filled 2019-08-10: qty 1

## 2019-08-10 MED ORDER — LIDOCAINE 2% (20 MG/ML) 5 ML SYRINGE
INTRAMUSCULAR | Status: DC | PRN
  Administered 2019-08-10: 100 mg via INTRAVENOUS

## 2019-08-10 MED ORDER — ALTEPLASE (STROKE) FULL DOSE INFUSION
90.0000 mg | Freq: Once | INTRAVENOUS | Status: AC
  Administered 2019-08-10: 90 mg via INTRAVENOUS
  Filled 2019-08-10: qty 100

## 2019-08-10 MED ORDER — CLEVIDIPINE BUTYRATE 0.5 MG/ML IV EMUL
INTRAVENOUS | Status: AC
  Filled 2019-08-10: qty 50

## 2019-08-10 MED ORDER — SUGAMMADEX SODIUM 200 MG/2ML IV SOLN
INTRAVENOUS | Status: DC | PRN
  Administered 2019-08-10: 200 mg via INTRAVENOUS

## 2019-08-10 MED ORDER — CLEVIDIPINE BUTYRATE 0.5 MG/ML IV EMUL
0.0000 mg/h | INTRAVENOUS | Status: DC
  Administered 2019-08-10: 16 mg/h via INTRAVENOUS
  Administered 2019-08-10: 2 mg/h via INTRAVENOUS
  Administered 2019-08-11: 6 mg/h via INTRAVENOUS
  Administered 2019-08-11: 3 mg/h via INTRAVENOUS
  Administered 2019-08-11: 6 mg/h via INTRAVENOUS
  Administered 2019-08-11 – 2019-08-12 (×3): 4 mg/h via INTRAVENOUS
  Administered 2019-08-12: 7 mg/h via INTRAVENOUS
  Administered 2019-08-12 (×3): 6 mg/h via INTRAVENOUS
  Administered 2019-08-12: 5 mg/h via INTRAVENOUS
  Administered 2019-08-13: 6 mg/h via INTRAVENOUS
  Administered 2019-08-13: 4 mg/h via INTRAVENOUS
  Filled 2019-08-10 (×5): qty 50
  Filled 2019-08-10: qty 100
  Filled 2019-08-10 (×7): qty 50

## 2019-08-10 MED ORDER — ACETAMINOPHEN 325 MG PO TABS: 650.0000 mg | ORAL_TABLET | ORAL | Status: DC | PRN

## 2019-08-10 MED ORDER — EPTIFIBATIDE 20 MG/10ML IV SOLN
INTRAVENOUS | Status: AC
  Filled 2019-08-10: qty 10

## 2019-08-10 MED ORDER — SENNOSIDES-DOCUSATE SODIUM 8.6-50 MG PO TABS: 1.0000 | ORAL_TABLET | Freq: Every evening | ORAL | Status: DC | PRN

## 2019-08-10 MED ORDER — ACETAMINOPHEN 160 MG/5ML PO SOLN
650.0000 mg | ORAL | Status: DC | PRN
  Administered 2019-08-14 – 2019-08-16 (×3): 650 mg
  Filled 2019-08-10 (×3): qty 20.3

## 2019-08-10 MED ORDER — ALBUTEROL SULFATE HFA 108 (90 BASE) MCG/ACT IN AERS
INHALATION_SPRAY | RESPIRATORY_TRACT | Status: DC | PRN
  Administered 2019-08-10 (×2): 2 via RESPIRATORY_TRACT

## 2019-08-10 MED ORDER — PANTOPRAZOLE SODIUM 40 MG IV SOLR
40.0000 mg | Freq: Every day | INTRAVENOUS | Status: DC
  Administered 2019-08-10 – 2019-08-16 (×7): 40 mg via INTRAVENOUS
  Filled 2019-08-10 (×7): qty 40

## 2019-08-10 MED ORDER — ASPIRIN 81 MG PO CHEW
CHEWABLE_TABLET | ORAL | Status: AC
  Filled 2019-08-10: qty 1

## 2019-08-10 MED ORDER — FENTANYL CITRATE (PF) 250 MCG/5ML IJ SOLN
INTRAMUSCULAR | Status: AC
  Filled 2019-08-10: qty 5

## 2019-08-10 MED ORDER — DEXAMETHASONE SODIUM PHOSPHATE 10 MG/ML IJ SOLN
INTRAMUSCULAR | Status: DC | PRN
  Administered 2019-08-10: 5 mg via INTRAVENOUS

## 2019-08-10 MED ORDER — IOHEXOL 350 MG/ML SOLN
75.0000 mL | Freq: Once | INTRAVENOUS | Status: AC | PRN
  Administered 2019-08-10: 75 mL via INTRAVENOUS

## 2019-08-10 MED ORDER — ONDANSETRON HCL 4 MG/2ML IJ SOLN
INTRAMUSCULAR | Status: DC | PRN
  Administered 2019-08-10: 4 mg via INTRAVENOUS

## 2019-08-10 MED ORDER — FENTANYL CITRATE (PF) 250 MCG/5ML IJ SOLN
INTRAMUSCULAR | Status: DC | PRN
  Administered 2019-08-10 (×2): 50 ug via INTRAVENOUS

## 2019-08-10 MED ORDER — IOHEXOL 240 MG/ML SOLN
INTRAMUSCULAR | Status: AC
  Filled 2019-08-10: qty 200

## 2019-08-10 NOTE — Progress Notes (Signed)
.  PHARMACIST CODE STROKE RESPONSE  Notified to mix tPA at 1744 by Dr. Erlinda Hong Delivered tPA to RN at 1749  tPA dose = 9mg  bolus over 1 minute followed by 81mg  for a total dose of 90mg  over 1 hour  Issues/delays encountered (if applicable):   Duanne Limerick PharmD. BCPS 08/10/19 5:51 PM
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
## 2019-08-10 NOTE — Progress Notes (Signed)
Family signed consent for procedure with IR MD. Pt prepped and intubated in [place]. ED RN gave handoff report with IR RN. Family taken to 4N waiting room and was told MD would call them with updates when procedure is complete. Contact numbers for family are updated in the chart. Breane Grunwald, Rande Brunt, RN, SCRN

## 2019-08-10 NOTE — Plan of Care (Addendum)
Received call from [facility] transfer line 7:02pm and had a on-call neurologist (I forgot his name) on the line to talk to me. They told me that Dr. Lenda Kelp is the teleneurologist and not IR physician. IR physician can not accept pt and I have to talk to neurologist in order to transfer. I told them that I thought this line is specific for IR transfer and they said this is the line for transfer to [facility] for everything. I told the on call neurologist that in the past I was able to talk to Dr. Arizona Constable and got the ball rolling fast, he said yes that we will be fast by contacting IR doctor specifically instead of using this line. At the same time, I was informed that our IR suite is finishing off the previous case and will take this pt very soon.   I discussed with family (sons and daughters) in the CT room, discussed with them about IR procedure, benefit and risks and they expression understanding and would like to proceed. They will sign consent in the IR bay.   Rosalin Hawking, MD PhD Stroke Neurology 08/10/2019 8:11 PM

## 2019-08-10 NOTE — Anesthesia Procedure Notes (Signed)
Procedure Name: Intubation Date/Time: 08/10/2019 7:49 PM Performed by: Nolon Nations, MD Pre-anesthesia Checklist: Patient identified, Emergency Drugs available, Suction available, Patient being monitored and Timeout performed Patient Re-evaluated:Patient Re-evaluated prior to induction Oxygen Delivery Method: Ambu bag Preoxygenation: Pre-oxygenation with 100% oxygen Induction Type: IV induction Laryngoscope Size: Glidescope and 3 Grade View: Grade I Tube type: Oral Tube size: 7.5 mm Number of attempts: 1 Airway Equipment and Method: Stylet and Oral airway Placement Confirmation: ETT inserted through vocal cords under direct vision,  positive ETCO2 and breath sounds checked- equal and bilateral Secured at: 24 cm Tube secured with: Tape Dental Injury: Teeth and Oropharynx as per pre-operative assessment

## 2019-08-10 NOTE — H&P (Signed)
Stroke Neurology Consultation Note  Consult Requested by: Dr. Ashok Cordia  Reason for Consult: stroke  Consult Date: 08/10/19  The history was obtained from the EMS and son.  During history and examination, all items were able to obtain unless otherwise noted.  History of Present Illness:  Malik [name] is a 74 y.o. African American male with PMH of HTN, DM, HLD, heavy alcohol was helping neighour with handy work. At 4:30pm, neighour heard falling sound in the kitchen, they saw him on the ground, not able to communicate, right side weakness. EMS called, on arrival pt BP 230s with aphasia, left gaze and right hemiplegia. Not enough PMH or medication info to obtain. En route, EKG showed probable Afib. In ER, pt NIHSS = 25. CT no acute abnormalities. BP controlled with 2 rounds of labetalol. Discussed with son over the phone of benefit and risk of tPA, son agreed and tPA given. CTA head and neck showed left mid M2 occlusion.  As per son and daughter, pt not compliant with medication and heavy drinker. No smoking cigarettes.   LSN: 4:30pm tPA Given: Yes  Past Medical History:  Diagnosis Date  . Angina of effort ([place])   . Chronic diastolic CHF (congestive heart failure) ([place])   . Diabetes ([place])   . Diabetes mellitus without complication ([place])   . Dilated cardiomyopathy ([place])   . Heart disease   . High blood pressure   . Hypertension   . 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 Moderate mitral insufficiency   . Neuropathy    with LE weakness  . Sleep apnea   . TBI (traumatic brain injury) [facility])     Past Surgical History:  Procedure Laterality Date  . BACK SURGERY    . 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 Hypertension Mother   . Hypertension Unknown     Social  History:  reports that he has quit smoking. His smoking use included cigarettes. He has a 45.00 pack-year smoking history. He has never used smokeless tobacco. He reports current alcohol use. He reports that he does not use drugs.  Allergies: No Known Allergies  No current facility-administered medications on file prior to encounter.   Current Outpatient Medications on File Prior to Encounter  Medication Sig Dispense Refill  . acetaminophen (TYLENOL) 325 MG tablet Take 1-2 tablets (325-650 mg total) by mouth every 4 (four) hours as needed for mild pain.    Marland Kitchen amLODipine (NORVASC) 10 MG tablet Take 1 tablet (10 mg total) by mouth daily. 30 tablet 0  . ascorbic acid (VITAMIN C) 250 MG tablet Take 1 tablet (250 mg total) by mouth daily. 30 tablet 0  . atorvastatin (LIPITOR) 40 MG tablet Take 1 tablet (40 mg total) by mouth daily. 30 tablet 0  . calcium-vitamin D (OSCAL WITH D) 500-200 MG-UNIT tablet Take 1 tablet by mouth 2 (two) times daily.    . ferrous sulfate 325 (65 FE) MG tablet Take 1 tablet (325 mg total) by mouth daily with breakfast. 30 tablet 0  . folic acid (FOLVITE) 1 MG tablet Take 1 tablet (1 mg total) by mouth daily. 30 tablet 0  . furosemide (LASIX) 20 MG tablet Take 1 tablet (20 mg total) by mouth daily. 30 tablet 0  . glipiZIDE (GLUCOTROL XL)  10 MG 24 hr tablet Take 1 tablet (10 mg total) by mouth daily with breakfast. 30 tablet 0  . hydrALAZINE (APRESOLINE) 100 MG tablet Take 1 tablet (100 mg total) by mouth every 8 (eight) hours. 90 tablet 0  . metFORMIN (GLUCOPHAGE) 500 MG tablet Take 1 tablet (500 mg total) by mouth 2 (two) times daily with a meal. 60 tablet 0  . methocarbamol (ROBAXIN) 500 MG tablet Take 1 tablet (500 mg total) by mouth every 6 (six) hours as needed for muscle spasms. 30 tablet 0  . metoprolol tartrate (LOPRESSOR) 25 MG tablet Take 1 tablet (25 mg total) by mouth 2 (two) times daily. 60 tablet 0  . Multiple Vitamins-Iron (MULTIVITAMINS WITH IRON) TABS tablet  Take 1 tablet by mouth daily.  0  . pantoprazole (PROTONIX) 40 MG tablet Take 1 tablet (40 mg total) by mouth daily. 30 tablet 0  . polyethylene glycol (MIRALAX / GLYCOLAX) packet Take 17 g by mouth 2 (two) times daily. 60 each 0  . thiamine 100 MG tablet Take 1 tablet (100 mg total) by mouth daily. 30 tablet 0  . traMADol (ULTRAM) 50 MG tablet Take 1 tablet (50 mg total) by mouth every 6 (six) hours as needed for moderate pain. 30 tablet 0  . traZODone (DESYREL) 50 MG tablet Take 1 tablet (50 mg total) by mouth at bedtime as needed for sleep. 30 tablet 0    Review of Systems: A full ROS was attempted today and was not able to be performed.    Physical Examination: Resp:  [107] 107 (06/01 1905) BP: (160-211)/(80-114) 171/80 (06/01 1905) Weight:  [100.1 kg] 100.1 kg (06/01 1700)  General - well nourished, well developed, in no apparent distress.    Ophthalmologic - fundi not visualized due to noncooperation.    Cardiovascular - regular rhythm and rate  Neuro - awake, eyes open, but global aphasia, not following commands, non verbal. Eyes left forced gaze, not cross midline. Mild right facial droop. Not blinking to visual threat on the right. LUE and LLE spontaneous movement against gravity, but RUE and RLE flaccid. Sensation, coordination and gait not tested.  NIH Stroke Scale  Level Of Consciousness 0=Alert; keenly responsive 1=Arouse to minor stimulation 2=Requires repeated stimulation to arouse or movements to pain 3=postures or unresponsive 0  LOC Questions to Month and Age 50=Answers both questions correctly 1=Answers one question correctly or dysarthria/intubated/trauma/language barrier 2=Answers neither question correctly or aphasia 2  LOC Commands      -Open/Close eyes     -Open/close grip     -Pantomime commands if communication barrier 0=Performs both tasks correctly 1=Performs one task correctly 2=Performs neighter task correctly 2  Best Gaze     -Only assess  horizontal gaze 0=Normal 1=Partial gaze palsy 2=Forced deviation, or total gaze paresis 2  Visual 0=No visual loss 1=Partial hemianopia 2=Complete hemianopia 3=Bilateral hemianopia (blind including cortical blindness) 2  Facial Palsy     -Use grimace if obtunded 0=Normal symmetrical movement 1=Minor paralysis (asymmetry) 2=Partial paralysis (lower face) 3=Complete paralysis (upper and lower face) 1  Motor  0=No drift for 10/5 seconds 1=Drift, but does not hit bed 2=Some antigravity effort, hits  bed 3=No effort against gravity, limb falls 4=No movement 0=Amputation/joint fusion Right Arm 4     Leg 4    Left Arm 0     Leg 0  Limb Ataxia     - FNT/HTS 0=Absent or does not understand or paralyzed or amputation/joint fusion 1=Present in one limb 2=Present  in two limbs 0  Sensory 0=Normal 1=Mild to moderate sensory loss 2=Severe to total sensory loss or coma/unresponsive 2  Best Language 0=No aphasia, normal 1=Mild to moderate aphasia 2=Severe aphasia 3=Mute, global aphasia, or coma/unresponsive 3  Dysarthria 0=Normal 1=Mild to moderate 2=Severe, unintelligible or mute/anarthric 0=intubated/unable to test 2  Extinction/Neglect 0=No abnormality 1=visual/tactile/auditory/spatia/personal inattention/Extinction to bilateral simultaneous stimulation 2=Profound neglect/extinction more than 1 modality  2  Total   26     Data Reviewed: CT Code Stroke CTA Head W/WO contrast  Result Date: 08/10/2019 CLINICAL DATA:  Stroke, follow-up EXAM: CT ANGIOGRAPHY HEAD AND NECK TECHNIQUE: Multidetector CT imaging of the head and neck was performed using the standard protocol during bolus administration of intravenous contrast. Multiplanar CT image reconstructions and MIPs were obtained to evaluate the vascular anatomy. Carotid stenosis measurements (when applicable) are obtained utilizing NASCET criteria, using the distal internal carotid diameter as the denominator. CONTRAST:  34mL OMNIPAQUE  IOHEXOL 350 MG/ML SOLN COMPARISON:  Non-contrast head CT performed earlier the same day 05/10/2017, chest CT 05/10/2017. FINDINGS: CTA NECK FINDINGS Elevation is somewhat limited due to contrast bolus timing. Aortic arch: Standard aortic branching. Atherosclerotic calcification within the visualized aortic arch and proximal major branch vessels of the neck. No hemodynamically significant innominate or proximal subclavian artery stenosis. Right carotid system: Streak artifact from a dense right-sided contrast bolus limits evaluation of the proximal right CCA. Within this limitation, the CCA and ICA are patent within the neck without significant stenosis (50% or greater). Mild calcified plaque within the carotid bifurcation and carotid bulb. Left carotid system: CCA and ICA patent within the neck without significant stenosis (50% or greater). Mild to moderate mixed plaque within the carotid bifurcation and carotid bulb. Vertebral arteries: Codominant and patent within the neck bilaterally without significant stenosis. Mild calcified plaque at the origin of the left vertebral artery. Skeleton: No acute bony abnormality or aggressive osseous lesion. Reversal of the expected cervical lordosis. Cervical spondylosis with multilevel disc space narrowing, posterior disc osteophyte complexes, uncovertebral and facet hypertrophy. Other neck: Heterogeneous enlarged thyroid gland extending into the superior mediastinum with multiple nodules and calcifications. A dominant right thyroid lobe nodule measures 3.5 cm (series 6, image 58). Upper chest: Emphysema. No consolidation within the imaged lung apices. Smooth interlobular septal thickening within the imaged lung apices which is nonspecific, but may reflect edema. Review of the MIP images confirms the above findings CTA HEAD FINDINGS Anterior circulation: Intracranial internal carotid arteries are patent bilaterally. Calcified plaque within these vessels bilaterally with no  more than mild stenosis. The M1 middle cerebral arteries are patent without significant stenosis. There is abrupt occlusion of a proximal to mid left M2 MCA branch vessel (series 16, image 31) (series 17, image 71). The anterior cerebral arteries are patent bilaterally without significant proximal stenosis. No intracranial aneurysm is identified. Posterior circulation: The intracranial vertebral arteries are patent without significant stenosis, as is the basilar artery. The posterior cerebral arteries are patent proximally without significant stenosis. Posterior communicating arteries are hypoplastic or absent bilaterally. Venous sinuses: Within limitations of contrast timing, no convincing thrombus. Hypoplastic left transverse and sigmoid dural venous sinuses. Anatomic variants: As described Review of the MIP images confirms the above findings These results were communicated to Dr. Erlinda Hong At 6:23 pmon 6/1/2021by text page via the [facility] messaging system. IMPRESSION: CTA neck: 1. The examination is somewhat limited due to poor contrast bolus timing at the level of the neck. 2. With streak artifact from a dense right-sided contrast bolus limits  evaluation of the proximal right CCA. Within this limitation, the bilateral common and internal carotid arteries are patent within the neck without significant stenosis. Atherosclerotic plaque within the carotid bifurcations and ICA bulbs bilaterally. 3. The vertebral arteries are patent within the neck bilaterally without significant stenosis. Mild calcified plaque at the origin of the left vertebral artery. 4. 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 A dominant right thyroid nodule measures 3.5 cm. Nonemergent thyroid ultrasound is recommended for further evaluation. CTA head: 1. Abrupt occlusion of a proximal to mid M2 left MCA branch vessel. 2. No other intracranial large vessel occlusion or proximal high-grade arterial stenosis is  identified. 3. Mild atherosclerotic disease within the intracranial ICAs bilaterally. Electronically Signed   By: Kellie Simmering DO   On: 08/10/2019 18:38   CT Code Stroke CTA Neck W/WO contrast  Result Date: 08/10/2019 CLINICAL DATA:  Stroke, follow-up EXAM: CT ANGIOGRAPHY HEAD AND NECK TECHNIQUE: Multidetector CT imaging of the head and neck was performed using the standard protocol during bolus administration of intravenous contrast. Multiplanar CT image reconstructions and MIPs were obtained to evaluate the vascular anatomy. Carotid stenosis measurements (when applicable) are obtained utilizing NASCET criteria, using the distal internal carotid diameter as the denominator. CONTRAST:  6mL OMNIPAQUE IOHEXOL 350 MG/ML SOLN COMPARISON:  Non-contrast head CT performed earlier the same day 05/10/2017, chest CT 05/10/2017. FINDINGS: CTA NECK FINDINGS Elevation is somewhat limited due to contrast bolus timing. Aortic arch: Standard aortic branching. Atherosclerotic calcification within the visualized aortic arch and proximal major branch vessels of the neck. No hemodynamically significant innominate or proximal subclavian artery stenosis. Right carotid system: Streak artifact from a dense right-sided contrast bolus limits evaluation of the proximal right CCA. Within this limitation, the CCA and ICA are patent within the neck without significant stenosis (50% or greater). Mild calcified plaque within the carotid bifurcation and carotid bulb. Left carotid system: CCA and ICA patent within the neck without significant stenosis (50% or greater). Mild to moderate mixed plaque within the carotid bifurcation and carotid bulb. Vertebral arteries: Codominant and patent within the neck bilaterally without significant stenosis. Mild calcified plaque at the origin of the left vertebral artery. Skeleton: No acute bony abnormality or aggressive osseous lesion. Reversal of the expected cervical lordosis. Cervical spondylosis with  multilevel disc space narrowing, posterior disc osteophyte complexes, uncovertebral and facet hypertrophy. Other neck: Heterogeneous enlarged thyroid gland extending into the superior mediastinum with multiple nodules and calcifications. A dominant right thyroid lobe nodule measures 3.5 cm (series 6, image 58). Upper chest: Emphysema. No consolidation within the imaged lung apices. Smooth interlobular septal thickening within the imaged lung apices which is nonspecific, but may reflect edema. Review of the MIP images confirms the above findings CTA HEAD FINDINGS Anterior circulation: Intracranial internal carotid arteries are patent bilaterally. Calcified plaque within these vessels bilaterally with no more than mild stenosis. The M1 middle cerebral arteries are patent without significant stenosis. There is abrupt occlusion of a proximal to mid left M2 MCA branch vessel (series 16, image 31) (series 17, image 71). The anterior cerebral arteries are patent bilaterally without significant proximal stenosis. No intracranial aneurysm is identified. Posterior circulation: The intracranial vertebral arteries are patent without significant stenosis, as is the basilar artery. The posterior cerebral arteries are patent proximally without significant stenosis. Posterior communicating arteries are hypoplastic or absent bilaterally. Venous sinuses: Within limitations of contrast timing, no convincing thrombus. Hypoplastic left transverse and sigmoid dural venous sinuses. Anatomic variants: As described Review of  the MIP images confirms the above findings These results were communicated to Dr. Erlinda Hong At 6:23 pmon 6/1/2021by text page via the [facility] messaging system. IMPRESSION: CTA neck: 1. The examination is somewhat limited due to poor contrast bolus timing at the level of the neck. 2. With streak artifact from a dense right-sided contrast bolus limits evaluation of the proximal right CCA. Within this limitation, the bilateral  common and internal carotid arteries are patent within the neck without significant stenosis. Atherosclerotic plaque within the carotid bifurcations and ICA bulbs bilaterally. 3. The vertebral arteries are patent within the neck bilaterally without significant stenosis. Mild calcified plaque at the origin of the left vertebral artery. 4. 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 A dominant right thyroid nodule measures 3.5 cm. Nonemergent thyroid ultrasound is recommended for further evaluation. CTA head: 1. Abrupt occlusion of a proximal to mid M2 left MCA branch vessel. 2. No other intracranial large vessel occlusion or proximal high-grade arterial stenosis is identified. 3. Mild atherosclerotic disease within the intracranial ICAs bilaterally. Electronically Signed   By: Kellie Simmering DO   On: 08/10/2019 18:38   CT HEAD CODE STROKE WO CONTRAST  Result Date: 08/10/2019 CLINICAL DATA:  Code stroke. Ataxia, stroke suspected. Additional history provided: Sudden collapse, left gaze, right hemiplegia. EXAM: CT HEAD WITHOUT CONTRAST TECHNIQUE: Contiguous axial images were obtained from the base of the skull through the vertex without intravenous contrast. COMPARISON:  Noncontrast head CT 05/10/2017 FINDINGS: Brain: Mild ill-defined hypoattenuation within the cerebral white matter is nonspecific, but consistent with chronic small vessel ischemic disease. Mild generalized parenchymal atrophy. There is no acute intracranial hemorrhage. No demarcated cortical infarct. No extra-axial fluid collection. No evidence of intracranial mass. No midline shift. Vascular: No hyperdense vessel.  Atherosclerotic calcifications. Skull: Normal. Negative for fracture or focal lesion. Sinuses/Orbits: Visualized orbits show no acute finding. Mild paranasal sinus mucosal thickening. No significant mastoid effusion. ASPECTS [facility] Stroke Program Early CT Score) - Ganglionic level infarction (caudate,  lentiform nuclei, internal capsule, insula, M1-M3 cortex): 7 - Supraganglionic infarction (M4-M6 cortex): 3 Total score (0-10 with 10 being normal): 10 These results were communicated to Dr. Erlinda Hong At 5:57 pmon 6/1/2021by text page via the [facility] messaging system. IMPRESSION: No CT evidence of acute intracranial abnormality. Mild generalized parenchymal atrophy and chronic small vessel ischemic disease. Mild paranasal sinus mucosal thickening. Electronically Signed   By: Kellie Simmering DO   On: 08/10/2019 17:57    Assessment: 74 y.o. male with PMH of HTN, DM, HLD, heavy alcohol presented with global aphasia, left gaze and right hemiplegia. LSW at 4:30pm. NIHSS = 25. CT no acute abnormalities. tPA given after discussion with family. CTA head and neck showed left mid M2 occlusion. Will need IR for thrombectomy and family was in agreement.   Plan: - Admit to ICU for routine post IV tPA care  - Stat IR for thrombectomy due to left M2 occlusion - discussed with family and Dr. Norma Fredrickson - Check blood pressure and NIHSS every 15 min for 2 h, then every 30 min for 6 h, and finally every hour for 16 h - BP goal < 180/105 - CT or MRI brain 24 hours post tPA - Stat CT head without contrast if acute neuro changes - NPO until swallowing screen performed and passed - No antiplatelet agents or anticoagulants (including heparin for DVT prophylaxis) in first 24 hours - No Foley catheter, nasogastric tube, arterial catheter or central venous catheter for 24 hours, unless absolutely necessary -  Telemetry - Euglycemia  - Avoid hyperthermia, PRN acetaminophen - DVT prophylaxis with SCDs  This patient is critically ill due to acute left MCA infarct s/p tPA and needs IR and at significant risk of neurological worsening, death form recurrent stroke, hemorrhagic conversion, heart failure, seizure. This patient's care requires constant monitoring of vital signs, hemodynamics, respiratory and cardiac monitoring, review of  multiple databases, neurological assessment, discussion with family, other specialists and medical decision making of high complexity. I spent 60 minutes of neurocritical care time in the care of this patient. I had long discussion with sons and daughters at bedside, updated pt current condition, treatment plan and potential prognosis, and answered all the questions. They expressed understanding and appreciation. I also discussed with Dr. Norma Fredrickson.     Rosalin Hawking, MD PhD Stroke Neurology 08/10/2019 7:23 PM

## 2019-08-10 NOTE — Progress Notes (Signed)
Pt's belongings sent with his daughter. Bag included socks, shoes, gold watch, wallet, and cellphone.

## 2019-08-10 NOTE — Sedation Documentation (Signed)
8 Fr. angioseal to right groin

## 2019-08-10 NOTE — Progress Notes (Signed)
SBP goal post tPA <180 due to minimal revasc post IR.  Instructed RN over phone.   Will update orders on meds  -- Amie Portland, MD [facility] Pager: [phone] If 7pm to 7am, please call on call as listed on AMION.

## 2019-08-10 NOTE — Plan of Care (Signed)
I also called [facility] transfer line at 6:31pm, talked to the staff and was told taht Dr. Lenda Kelp will be the receiving Dr. However, he is also in the procedure and he will call me as soon as he finishes the procedure. I left my cell phone number for him to call back.   6:45pm Careline at bedside for transfer but we have not been able to touch base with receiving doctor and do not know whether pt will go to ER or straight IR. [facility] can not transfer pt like this. We have to wait. '  Currently, pt neuro condition not changed. Still has global aphasia, right hemiplegia with left gaze. NIHSS = 25. tPA now near completed.    Rosalin Hawking, MD PhD Stroke Neurology 08/10/2019 6:55 PM

## 2019-08-10 NOTE — Progress Notes (Signed)
[facility] transfer line called and pt info given with MD at RN's side. Bed placement will call back MD shortly. Kinsley Holderman, Rande Brunt, RN

## 2019-08-10 NOTE — Progress Notes (Addendum)
Dr. Lorraine Lax of neurology called this RN and clarified blood pressure goals. Dr. Lorraine Lax stated that patient's systolic blood pressure is to be less than 180. Will continue to monitor.

## 2019-08-10 NOTE — Progress Notes (Signed)
TPA started at 1749 and stopped at 1849. 52mL flush started at [place] running at same rate of 19ml/hr.

## 2019-08-10 NOTE — Transfer of Care (Signed)
Immediate Anesthesia Transfer of Care Note  Patient: Malik [name]  Procedure(s) Performed: IR WITH ANESTHESIA -CODE STROKE (N/A )  Patient Location: ICU  Anesthesia Type:General  Level of Consciousness: drowsy  Airway & Oxygen Therapy: Patient Spontanous Breathing and Patient connected to face mask oxygen  Post-op Assessment: Report given to RN and Post -op Vital signs reviewed and stable moving left arm vigorously   Post vital signs: Reviewed and stable  Last Vitals:  Vitals Value Taken Time  BP    Temp    Pulse 63 08/10/19 2212  Resp 20 08/10/19 2212  SpO2 98 % 08/10/19 2212  Vitals shown include unvalidated device data.  Last Pain: There were no vitals filed for this visit.       Complications: No apparent anesthesia complications

## 2019-08-10 NOTE — Procedures (Signed)
INTERVENTIONAL NEURORADIOLOGY BRIEF POSTPROCEDURE NOTE  Diagnostic cerebral angiogram and mechanical thrombectomy  Attending: Dr. Pedro Earls  Assistant: None  Diagnosis: Left M3 occlusion  Access site: RCFA  Access closure: 70F angioseal  Anesthesia: General  Medication used: refer to anesthesia documentation.  Complications: Minimal SAH.  Estimated blood loss: 50 mL  Specimen: None  Findings: There was a left M3/MCA superior division branch occlusion. There was also slow flow with subocclusive filling defect in left M4/MCA branches.  Mechanical thrombectomy performed in the M3 superior division branch. 2 direct aspiration passes and 1 stent retriever performed (3 mm solitaire). Despite retrieving clot x2, only minimal recanalization was achieved (TICI 2A). Flat panel CT showed contrast stagnation within the M3 branch with minimal extravasation.  The patient tolerated the procedure well without incident or complication and is in stable condition.

## 2019-08-10 NOTE — Anesthesia Preprocedure Evaluation (Addendum)
Anesthesia Evaluation  Patient identified by MRN, date of birth, ID bandPreop documentation limited or incomplete due to emergent nature of procedure.  Airway Mallampati: II   Neck ROM: full    Dental   Pulmonary sleep apnea , former smoker,    breath sounds clear to auscultation       Cardiovascular hypertension, + angina +CHF  + Valvular Problems/Murmurs MR  Rhythm:regular Rate:Normal  Dilated cardiomyopathy. Moderate MR.    Neuro/Psych CVA    GI/Hepatic   Endo/Other  diabetes, Type 2  Renal/GU      Musculoskeletal   Abdominal   Peds  Hematology  (+) Blood dyscrasia, anemia ,   Anesthesia Other Findings   Reproductive/Obstetrics                            Anesthesia Physical Anesthesia Plan  ASA: IV and emergent  Anesthesia Plan: General   Post-op Pain Management:    Induction: Intravenous, Rapid sequence and Cricoid pressure planned  PONV Risk Score and Plan: 2 and Ondansetron, Dexamethasone and Treatment may vary due to age or medical condition  Airway Management Planned: Oral ETT  Additional Equipment: Arterial line  Intra-op Plan:   Post-operative Plan: Possible Post-op intubation/ventilation  Informed Consent: I have reviewed the patients History and Physical, chart, labs and discussed the procedure including the risks, benefits and alternatives for the proposed anesthesia with the patient or authorized representative who has indicated his/her understanding and acceptance.       Plan Discussed with: CRNA, Anesthesiologist and Surgeon  Anesthesia Plan Comments:         Anesthesia Quick Evaluation

## 2019-08-11 ENCOUNTER — Inpatient Hospital Stay (HOSPITAL_COMMUNITY): Payer: Medicare Other

## 2019-08-11 ENCOUNTER — Encounter: Payer: Self-pay | Admitting: *Deleted

## 2019-08-11 DIAGNOSIS — E7800 Pure hypercholesterolemia, unspecified: Secondary | ICD-10-CM

## 2019-08-11 DIAGNOSIS — F1010 Alcohol abuse, uncomplicated: Secondary | ICD-10-CM

## 2019-08-11 DIAGNOSIS — I639 Cerebral infarction, unspecified: Secondary | ICD-10-CM

## 2019-08-11 DIAGNOSIS — I340 Nonrheumatic mitral (valve) insufficiency: Secondary | ICD-10-CM

## 2019-08-11 DIAGNOSIS — I161 Hypertensive emergency: Secondary | ICD-10-CM

## 2019-08-11 DIAGNOSIS — E1159 Type 2 diabetes mellitus with other circulatory complications: Secondary | ICD-10-CM

## 2019-08-11 LAB — BASIC METABOLIC PANEL
Anion gap: 10 (ref 5–15)
BUN: 9 mg/dL (ref 8–23)
CO2: 21 mmol/L — ABNORMAL LOW (ref 22–32)
Calcium: 8.1 mg/dL — ABNORMAL LOW (ref 8.9–10.3)
Chloride: 106 mmol/L (ref 98–111)
Creatinine, Ser: 1.32 mg/dL — ABNORMAL HIGH (ref 0.61–1.24)
GFR calc Af Amer: >60 mL/min (ref >60)
GFR calc non Af Amer: 52 mL/min — ABNORMAL LOW (ref >60)
Glucose, Bld: 195 mg/dL — ABNORMAL HIGH (ref 70–99)
Potassium: 5.8 mmol/L — ABNORMAL HIGH (ref 3.5–5.1)
Sodium: 137 mmol/L (ref 135–145)

## 2019-08-11 LAB — URINALYSIS, ROUTINE W REFLEX MICROSCOPIC
Bilirubin Urine: NEGATIVE
Glucose, UA: >=500 mg/dL — AB
Ketones, ur: NEGATIVE mg/dL
Nitrite: NEGATIVE
Protein, ur: 30 mg/dL — AB
Specific Gravity, Urine: 1.023 (ref 1.005–1.030)
pH: 6 (ref 5.0–8.0)

## 2019-08-11 LAB — GLUCOSE, CAPILLARY
Glucose-Capillary: 117 mg/dL — ABNORMAL HIGH (ref 70–99)
Glucose-Capillary: 144 mg/dL — ABNORMAL HIGH (ref 70–99)
Glucose-Capillary: 105 mg/dL — ABNORMAL HIGH (ref 70–99)
Glucose-Capillary: 147 mg/dL — ABNORMAL HIGH (ref 70–99)

## 2019-08-11 LAB — CBC
HCT: 28.7 % — ABNORMAL LOW (ref 39.0–52.0)
Hemoglobin: 8.5 g/dL — ABNORMAL LOW (ref 13.0–17.0)
MCH: 20.6 pg — ABNORMAL LOW (ref 26.0–34.0)
MCHC: 29.6 g/dL — ABNORMAL LOW (ref 30.0–36.0)
MCV: 69.7 fL — ABNORMAL LOW (ref 80.0–100.0)
Platelets: 387 10*3/uL (ref 150–400)
RBC: 4.12 MIL/uL — ABNORMAL LOW (ref 4.22–5.81)
RDW: 21 % — ABNORMAL HIGH (ref 11.5–15.5)
WBC: 11.7 10*3/uL — ABNORMAL HIGH (ref 4.0–10.5)
nRBC: 0.3 % — ABNORMAL HIGH (ref 0.0–0.2)

## 2019-08-11 LAB — POCT I-STAT, CHEM 8
BUN: 8 mg/dL (ref 8–23)
Calcium, Ion: 1.02 mmol/L — ABNORMAL LOW (ref 1.15–1.40)
Chloride: 107 mmol/L (ref 98–111)
Creatinine, Ser: 1.4 mg/dL — ABNORMAL HIGH (ref 0.61–1.24)
Glucose, Bld: 133 mg/dL — ABNORMAL HIGH (ref 70–99)
HCT: 36 % — ABNORMAL LOW (ref 39.0–52.0)
Hemoglobin: 12.2 g/dL — ABNORMAL LOW (ref 13.0–17.0)
Potassium: 3.4 mmol/L — ABNORMAL LOW (ref 3.5–5.1)
Sodium: 144 mmol/L (ref 135–145)
TCO2: 23 mmol/L (ref 22–32)

## 2019-08-11 LAB — HEMOGLOBIN A1C
Hgb A1c MFr Bld: 6.9 % — ABNORMAL HIGH (ref 4.8–5.6)
Hgb A1c MFr Bld: 5.4 % (ref 4.8–5.6)
Mean Plasma Glucose: 151.33 mg/dL
Mean Plasma Glucose: 108.28 mg/dL

## 2019-08-11 LAB — LIPID PANEL
Cholesterol: 189 mg/dL (ref 0–200)
HDL: 49 mg/dL (ref >40)
LDL Cholesterol: 127 mg/dL — ABNORMAL HIGH (ref 0–99)
Total CHOL/HDL Ratio: 3.9 RATIO
Triglycerides: 64 mg/dL (ref <150)
VLDL: 13 mg/dL (ref 0–40)

## 2019-08-11 LAB — ECHOCARDIOGRAM COMPLETE: Weight: 3530.89 oz

## 2019-08-11 LAB — RAPID URINE DRUG SCREEN, HOSP PERFORMED
Amphetamines: NOT DETECTED
Barbiturates: NOT DETECTED
Benzodiazepines: NOT DETECTED
Cocaine: NOT DETECTED
Opiates: NOT DETECTED
Tetrahydrocannabinol: NOT DETECTED

## 2019-08-11 LAB — MRSA PCR SCREENING: MRSA by PCR: NEGATIVE

## 2019-08-11 MED ORDER — THIAMINE HCL 100 MG/ML IJ SOLN
100.0000 mg | Freq: Every day | INTRAMUSCULAR | Status: DC
  Administered 2019-08-11 – 2019-08-13 (×3): 100 mg via INTRAVENOUS
  Filled 2019-08-11 (×3): qty 2

## 2019-08-11 MED ORDER — INSULIN ASPART 100 UNIT/ML ~~LOC~~ SOLN
0.0000 [IU] | SUBCUTANEOUS | Status: DC
  Administered 2019-08-11 – 2019-08-13 (×5): 2 [IU] via SUBCUTANEOUS
  Administered 2019-08-13: 3 [IU] via SUBCUTANEOUS
  Administered 2019-08-13: 2 [IU] via SUBCUTANEOUS
  Administered 2019-08-13 – 2019-08-14 (×2): 3 [IU] via SUBCUTANEOUS
  Administered 2019-08-14: 2 [IU] via SUBCUTANEOUS
  Administered 2019-08-14 (×4): 3 [IU] via SUBCUTANEOUS
  Administered 2019-08-15: 2 [IU] via SUBCUTANEOUS
  Administered 2019-08-15: 5 [IU] via SUBCUTANEOUS
  Administered 2019-08-15 (×2): 3 [IU] via SUBCUTANEOUS
  Administered 2019-08-15 (×2): 2 [IU] via SUBCUTANEOUS
  Administered 2019-08-15: 3 [IU] via SUBCUTANEOUS
  Administered 2019-08-16: 2 [IU] via SUBCUTANEOUS
  Administered 2019-08-16: 3 [IU] via SUBCUTANEOUS
  Administered 2019-08-16 (×3): 2 [IU] via SUBCUTANEOUS
  Administered 2019-08-17 (×2): 3 [IU] via SUBCUTANEOUS
  Administered 2019-08-17: 2 [IU] via SUBCUTANEOUS
  Administered 2019-08-17: 3 [IU] via SUBCUTANEOUS
  Administered 2019-08-18: 5 [IU] via SUBCUTANEOUS
  Administered 2019-08-18 (×2): 3 [IU] via SUBCUTANEOUS
  Administered 2019-08-18: 2 [IU] via SUBCUTANEOUS
  Administered 2019-08-18 – 2019-08-19 (×4): 3 [IU] via SUBCUTANEOUS
  Administered 2019-08-19 – 2019-08-20 (×2): 2 [IU] via SUBCUTANEOUS
  Administered 2019-08-20 (×3): 3 [IU] via SUBCUTANEOUS
  Administered 2019-08-21 – 2019-08-23 (×6): 2 [IU] via SUBCUTANEOUS
  Administered 2019-08-23: 3 [IU] via SUBCUTANEOUS
  Administered 2019-08-23: 2 [IU] via SUBCUTANEOUS
  Administered 2019-08-24: 3 [IU] via SUBCUTANEOUS

## 2019-08-11 MED ORDER — LORAZEPAM 2 MG/ML IJ SOLN
1.0000 mg | INTRAMUSCULAR | Status: AC | PRN
  Administered 2019-08-11: 2 mg via INTRAVENOUS
  Filled 2019-08-11: qty 1

## 2019-08-11 MED ORDER — ORAL CARE MOUTH RINSE
15.0000 mL | Freq: Two times a day (BID) | OROMUCOSAL | Status: DC
  Administered 2019-08-11 – 2019-08-24 (×23): 15 mL via OROMUCOSAL

## 2019-08-11 MED ORDER — LORAZEPAM 1 MG PO TABS: 1.0000 mg | ORAL_TABLET | ORAL | Status: AC | PRN

## 2019-08-11 MED ORDER — CHLORHEXIDINE GLUCONATE 0.12 % MT SOLN
15.0000 mL | Freq: Two times a day (BID) | OROMUCOSAL | Status: DC
  Administered 2019-08-11 – 2019-08-24 (×26): 15 mL via OROMUCOSAL
  Filled 2019-08-11 (×21): qty 15

## 2019-08-11 MED ORDER — FOLIC ACID 5 MG/ML IJ SOLN
1.0000 mg | Freq: Every day | INTRAMUSCULAR | Status: DC
  Administered 2019-08-11 – 2019-08-12 (×2): 1 mg via INTRAVENOUS
  Filled 2019-08-11 (×4): qty 0.2

## 2019-08-11 NOTE — Evaluation (Signed)
Clinical/Bedside Swallow Evaluation Patient Details  Name: Malik [name] MRN: [id number] Date of Birth: 1945/12/16  Today's Date: 08/11/2019 Time: SLP Start Time (ACUTE ONLY): 1115 SLP Stop Time (ACUTE ONLY): 1123 SLP Time Calculation (min) (ACUTE ONLY): 8 min  Past Medical History:  Past Medical History:  Diagnosis Date  . Angina of effort ([place])   . Chronic diastolic CHF (congestive heart failure) ([place])   . Diabetes ([place])   . Diabetes mellitus without complication ([place])   . Dilated cardiomyopathy ([place])   . Heart disease   . High blood pressure   . Hypertension   . 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 Moderate mitral insufficiency   . Neuropathy    with LE weakness  . Sleep apnea   . TBI (traumatic brain injury) [facility])    Past Surgical History:  Past Surgical History:  Procedure Laterality Date  . BACK SURGERY    . RADIOLOGY WITH ANESTHESIA N/A 08/10/2019   Procedure: IR WITH ANESTHESIA -CODE STROKE;  Surgeon: Radiologist, Medication, MD;  Location: [place];  Service: Radiology;  Laterality: N/A;  . 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 CT Head 6/2 showed significant progression of L MCA infarct with hypodensity in the L middle frontal gyrus extending into the L parietal lobe and L occipital lobe. BSE in 2019 recommended NPO but he progressed very quickly to regular solids/thin liquids as mentation improved. PMH includes: TBI, sleep apnea, HTN, DM, CHF, HLD, heavy alcohol use   Assessment / Plan / Recommendation Clinical Impression  Pt presents with R-sided facial weakness and suspected sensory issues with aphasia that also impacts his ability to attempt strategies. He has inadequate labial  seal to attempt a straw and thin liquids spill anteriorly from his R side. Coughing is noted with thin liquids, with concern for reduced oral control and decreased airway protection. He has moderate R buccal pocketing with purees, requiring oral suction to clear. He is not ready for a PO diet today. SLP will continue to follow with MBS likely beneficial as he becomes less drowsy.  SLP Visit Diagnosis: Dysphagia, oropharyngeal phase (R13.12)    Aspiration Risk  Moderate aspiration risk    Diet Recommendation NPO   Medication Administration: Via alternative means    Other  Recommendations Oral Care Recommendations: Oral care QID Other Recommendations: Have oral suction available   Follow up Recommendations (tba)      Frequency and Duration min 2x/week  2 weeks       Prognosis Prognosis for Safe Diet Advancement: Good Barriers to Reach Goals: Language deficits      Swallow Study   General HPI: Pt is a 74 yo male presenting with acute onset R weakness and difficulty speaking s/p tPA and L MCA thrombectomy 6/1. CT Head 6/2 showed significant progression of L MCA infarct with hypodensity in the L middle frontal gyrus extending into the L parietal lobe and L occipital lobe. BSE in 2019 recommended NPO but he progressed very quickly to regular solids/thin liquids as mentation improved. PMH includes: TBI, sleep apnea, HTN, DM, CHF, HLD, heavy alcohol use Type of Study: Bedside Swallow Evaluation Previous Swallow Assessment: see HPI Diet Prior to this Study: NPO Temperature Spikes Noted:  No Respiratory Status: Room air History of Recent Intubation: No Behavior/Cognition: Alert;Doesn't follow directions Oral Cavity Assessment: Within Functional Limits(as can be visualized - doesn't open mouth to command) Oral Care Completed by SLP: Yes Oral Cavity - Dentition: Missing dentition Self-Feeding Abilities: Total assist Patient Positioning: Upright in bed Baseline Vocal Quality:  Normal Volitional Cough: Cognitively unable to elicit Volitional Swallow: Unable to elicit    Oral/Motor/Sensory Function Overall Oral Motor/Sensory Function: Moderate impairment(mild-moderate) Facial ROM: Reduced right;Suspected CN VII (facial) dysfunction Facial Symmetry: Abnormal symmetry right;Suspected CN VII (facial) dysfunction Facial Strength: Reduced right;Suspected CN VII (facial) dysfunction Facial Sensation: Reduced right;Suspected CN V (Trigeminal) dysfunction   Ice Chips Ice chips: Impaired Presentation: Spoon Oral Phase Functional Implications: Oral holding Pharyngeal Phase Impairments: Suspected delayed Swallow   Thin Liquid Thin Liquid: Impaired Presentation: Spoon;Cup Oral Phase Impairments: Reduced labial seal;Other (comment)(not able to get liquid via straw) Oral Phase Functional Implications: Right anterior spillage;Prolonged oral transit Pharyngeal  Phase Impairments: Suspected delayed Swallow;Cough - Immediate    Nectar Thick Nectar Thick Liquid: Not tested   Honey Thick Honey Thick Liquid: Not tested   Puree Puree: Impaired Presentation: Spoon Oral Phase Functional Implications: Right lateral sulci pocketing;Prolonged oral transit   Solid     Solid: Not tested       Osie Bond., M.A. [place] Pager [phone] Office [phone]  08/11/2019,11:50 AM

## 2019-08-11 NOTE — Progress Notes (Signed)
24h MRI completed. Large LMCA stroke with HT. Hold aspirin for now due to HT  -- Amie Portland, MD [facility] Pager: [phone] If 7pm to 7am, please call on call as listed on AMION.

## 2019-08-11 NOTE — Progress Notes (Signed)
Pt's daughter, [name], at the bedside, stated that her father's POA is [name], Brooke Bonito, the second son. Earlier this morning, Robley Fries, the eldest son, called and told me he was the "overseer" of his dad's affairs. [name] was going to call [facility] and remind him he is not the POA. She was very irritated that Kelvin did this. She is going to try to find the actual copy of the POA documentation and bring it in.

## 2019-08-11 NOTE — Progress Notes (Signed)
Echocardiogram 2D Echocardiogram has been performed.  Oneal Deputy Sherry Blackard 08/11/2019, 10:41 AM

## 2019-08-11 NOTE — Anesthesia Postprocedure Evaluation (Signed)
Anesthesia Post Note  Patient: Malik [name]  Procedure(s) Performed: IR WITH ANESTHESIA -CODE STROKE (N/A )     Patient location during evaluation: ICU Anesthesia Type: General Level of consciousness: sedated and patient cooperative Pain management: pain level controlled Vital Signs Assessment: post-procedure vital signs reviewed and stable Respiratory status: spontaneous breathing Cardiovascular status: stable Anesthetic complications: no    Last Vitals:  Vitals:   08/11/19 0700 08/11/19 0800  BP: (!) 177/85 (!) 172/90  Pulse: 82 93  Resp: 14 16  Temp:  37 C  SpO2: 97% 91%    Last Pain:  Vitals:   08/11/19 0800  TempSrc: Axillary                 Nolon Nations

## 2019-08-11 NOTE — Progress Notes (Signed)
Referring Physician(s): Code Stroke- Erlinda Hong, [place]  Supervising Physician: Pedro Earls  Patient Status:  [facility] - In-pt  Chief Complaint: None- nonverbal  Subjective:  History of acute CVA s/p cerebral arteriogram with emergent mechanical thrombectomy of left MCA M3 occlusion achieving a TICI 2A revascularization via right femoral approach 08/10/2019 by Dr. Karenann Cai. Patient laying in bed resting comfortably. He opens eyes to voice. He is nonverbal and does not follow simple commands. Can spontaneously move left side but no movements of right side. Right groin incision c/d/i.  CT head this AM: 1. There has been significant progression of left MCA infarct. There is hypodensity in the left middle frontal gyrus extending into the left parietal lobe and left occipital lobe. 2. Linear hyperdensity in the left sylvian fissure likely resent represents a small amount of subarachnoid hemorrhage.   Allergies: Patient has no known allergies.  Medications: Prior to Admission medications   Medication Sig Start Date End Date Taking? Authorizing Provider  acetaminophen (TYLENOL) 325 MG tablet Take 1-2 tablets (325-650 mg total) by mouth every 4 (four) hours as needed for mild pain. 06/06/17   Love, Ivan Anchors, PA-C  amLODipine (NORVASC) 10 MG tablet Take 1 tablet (10 mg total) by mouth daily. 06/06/17   Love, Ivan Anchors, PA-C  ascorbic acid (VITAMIN C) 250 MG tablet Take 1 tablet (250 mg total) by mouth daily. 06/07/17   Love, Ivan Anchors, PA-C  atorvastatin (LIPITOR) 40 MG tablet Take 1 tablet (40 mg total) by mouth daily. 06/06/17   Love, Ivan Anchors, PA-C  calcium-vitamin D (OSCAL WITH D) 500-200 MG-UNIT tablet Take 1 tablet by mouth 2 (two) times daily. 06/06/17   Love, Ivan Anchors, PA-C  ferrous sulfate 325 (65 FE) MG tablet Take 1 tablet (325 mg total) by mouth daily with breakfast. 06/07/17   Love, Ivan Anchors, PA-C  folic acid (FOLVITE) 1 MG tablet Take 1 tablet (1 mg total) by mouth  daily. 06/06/17   Love, Ivan Anchors, PA-C  furosemide (LASIX) 20 MG tablet Take 1 tablet (20 mg total) by mouth daily. 06/07/17   Love, Ivan Anchors, PA-C  glipiZIDE (GLUCOTROL XL) 10 MG 24 hr tablet Take 1 tablet (10 mg total) by mouth daily with breakfast. 06/06/17   Love, Ivan Anchors, PA-C  hydrALAZINE (APRESOLINE) 100 MG tablet Take 1 tablet (100 mg total) by mouth every 8 (eight) hours. 06/06/17   Love, Ivan Anchors, PA-C  metFORMIN (GLUCOPHAGE) 500 MG tablet Take 1 tablet (500 mg total) by mouth 2 (two) times daily with a meal. 06/07/17   Love, Ivan Anchors, PA-C  methocarbamol (ROBAXIN) 500 MG tablet Take 1 tablet (500 mg total) by mouth every 6 (six) hours as needed for muscle spasms. 06/06/17   Love, Ivan Anchors, PA-C  metoprolol tartrate (LOPRESSOR) 25 MG tablet Take 1 tablet (25 mg total) by mouth 2 (two) times daily. 06/06/17   Love, Ivan Anchors, PA-C  Multiple Vitamins-Iron (MULTIVITAMINS WITH IRON) TABS tablet Take 1 tablet by mouth daily. 06/06/17   Love, Ivan Anchors, PA-C  pantoprazole (PROTONIX) 40 MG tablet Take 1 tablet (40 mg total) by mouth daily. 06/07/17   Love, Ivan Anchors, PA-C  polyethylene glycol (MIRALAX / GLYCOLAX) packet Take 17 g by mouth 2 (two) times daily. 06/06/17   Love, Ivan Anchors, PA-C  thiamine 100 MG tablet Take 1 tablet (100 mg total) by mouth daily. 06/06/17   Love, Ivan Anchors, PA-C  traMADol (ULTRAM) 50 MG tablet Take 1 tablet (50  mg total) by mouth every 6 (six) hours as needed for moderate pain. 06/06/17   Love, Ivan Anchors, PA-C  traZODone (DESYREL) 50 MG tablet Take 1 tablet (50 mg total) by mouth at bedtime as needed for sleep. 06/06/17   Love, Ivan Anchors, PA-C     Vital Signs: BP (!) 178/107    Pulse 91    Temp 98.6 F (37 C) (Axillary)    Resp 18    Wt 220 lb 10.9 oz (100.1 kg)    SpO2 98%    BMI 30.78 kg/m   Physical Exam Vitals and nursing note reviewed.  Constitutional:      General: He is not in acute distress. Pulmonary:     Effort: Pulmonary effort is normal. No respiratory distress.    Skin:    General: Skin is warm and dry.     Comments: Right groin incision soft without active bleeding or hematoma.  Neurological:     Comments: Alert and awake. He does not follow simple commands. Nonverbal. PERRL bilaterally. Left gaze preference. Can spontaneously move left side but no movements of right side. Distal pulses (DPs) 2+ bilaterally.     Imaging: CT Code Stroke CTA Head W/WO contrast  Result Date: 08/10/2019 CLINICAL DATA:  Stroke, follow-up EXAM: CT ANGIOGRAPHY HEAD AND NECK TECHNIQUE: Multidetector CT imaging of the head and neck was performed using the standard protocol during bolus administration of intravenous contrast. Multiplanar CT image reconstructions and MIPs were obtained to evaluate the vascular anatomy. Carotid stenosis measurements (when applicable) are obtained utilizing NASCET criteria, using the distal internal carotid diameter as the denominator. CONTRAST:  6mL OMNIPAQUE IOHEXOL 350 MG/ML SOLN COMPARISON:  Non-contrast head CT performed earlier the same day 05/10/2017, chest CT 05/10/2017. FINDINGS: CTA NECK FINDINGS Elevation is somewhat limited due to contrast bolus timing. Aortic arch: Standard aortic branching. Atherosclerotic calcification within the visualized aortic arch and proximal major branch vessels of the neck. No hemodynamically significant innominate or proximal subclavian artery stenosis. Right carotid system: Streak artifact from a dense right-sided contrast bolus limits evaluation of the proximal right CCA. Within this limitation, the CCA and ICA are patent within the neck without significant stenosis (50% or greater). Mild calcified plaque within the carotid bifurcation and carotid bulb. Left carotid system: CCA and ICA patent within the neck without significant stenosis (50% or greater). Mild to moderate mixed plaque within the carotid bifurcation and carotid bulb. Vertebral arteries: Codominant and patent within the neck bilaterally without  significant stenosis. Mild calcified plaque at the origin of the left vertebral artery. Skeleton: No acute bony abnormality or aggressive osseous lesion. Reversal of the expected cervical lordosis. Cervical spondylosis with multilevel disc space narrowing, posterior disc osteophyte complexes, uncovertebral and facet hypertrophy. Other neck: Heterogeneous enlarged thyroid gland extending into the superior mediastinum with multiple nodules and calcifications. A dominant right thyroid lobe nodule measures 3.5 cm (series 6, image 58). Upper chest: Emphysema. No consolidation within the imaged lung apices. Smooth interlobular septal thickening within the imaged lung apices which is nonspecific, but may reflect edema. Review of the MIP images confirms the above findings CTA HEAD FINDINGS Anterior circulation: Intracranial internal carotid arteries are patent bilaterally. Calcified plaque within these vessels bilaterally with no more than mild stenosis. The M1 middle cerebral arteries are patent without significant stenosis. There is abrupt occlusion of a proximal to mid left M2 MCA branch vessel (series 16, image 31) (series 17, image 71). The anterior cerebral arteries are patent bilaterally without significant proximal stenosis.  No intracranial aneurysm is identified. Posterior circulation: The intracranial vertebral arteries are patent without significant stenosis, as is the basilar artery. The posterior cerebral arteries are patent proximally without significant stenosis. Posterior communicating arteries are hypoplastic or absent bilaterally. Venous sinuses: Within limitations of contrast timing, no convincing thrombus. Hypoplastic left transverse and sigmoid dural venous sinuses. Anatomic variants: As described Review of the MIP images confirms the above findings These results were communicated to Dr. Erlinda Hong At 6:23 pmon 6/1/2021by text page via the [facility] messaging system. IMPRESSION: CTA neck: 1. The examination is  somewhat limited due to poor contrast bolus timing at the level of the neck. 2. With streak artifact from a dense right-sided contrast bolus limits evaluation of the proximal right CCA. Within this limitation, the bilateral common and internal carotid arteries are patent within the neck without significant stenosis. Atherosclerotic plaque within the carotid bifurcations and ICA bulbs bilaterally. 3. The vertebral arteries are patent within the neck bilaterally without significant stenosis. Mild calcified plaque at the origin of the left vertebral artery. 4. 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 A dominant right thyroid nodule measures 3.5 cm. Nonemergent thyroid ultrasound is recommended for further evaluation. CTA head: 1. Abrupt occlusion of a proximal to mid M2 left MCA branch vessel. 2. No other intracranial large vessel occlusion or proximal high-grade arterial stenosis is identified. 3. Mild atherosclerotic disease within the intracranial ICAs bilaterally. Electronically Signed   By: Kellie Simmering DO   On: 08/10/2019 18:38   CT HEAD WO CONTRAST  Result Date: 08/11/2019 CLINICAL DATA:  Stroke.  Left MCA thrombectomy yesterday. EXAM: CT HEAD WITHOUT CONTRAST TECHNIQUE: Contiguous axial images were obtained from the base of the skull through the vertex without intravenous contrast. COMPARISON:  CT head 08/10/2019 FINDINGS: Brain: Progressive infarction is now present in the left MCA territory. Infarct extends into the left parietal lobe and left occipital lobe. Linear density in the anterior sylvian fissure on the left likely represents a small amount of subarachnoid hemorrhage. No midline shift. Chronic infarct right cerebellum is small. Small chronic infarct right internal capsule. Ventricle size is normal. Vascular: Linear hyperdensity in the left anterior sylvian fissure may represent thrombus in a superior division of the left MCA versus a small amount of hemorrhage.  Skull: Negative Sinuses/Orbits: Mucosal edema paranasal sinuses.  Negative orbit Other: None IMPRESSION: 1. There has been significant progression of left MCA infarct. There is hypodensity in the left middle frontal gyrus extending into the left parietal lobe and left occipital lobe. 2. Linear hyperdensity in the left sylvian fissure likely resent represents a small amount of subarachnoid hemorrhage. Electronically Signed   By: Franchot Gallo M.D.   On: 08/11/2019 08:47   CT Code Stroke CTA Neck W/WO contrast  Result Date: 08/10/2019 CLINICAL DATA:  Stroke, follow-up EXAM: CT ANGIOGRAPHY HEAD AND NECK TECHNIQUE: Multidetector CT imaging of the head and neck was performed using the standard protocol during bolus administration of intravenous contrast. Multiplanar CT image reconstructions and MIPs were obtained to evaluate the vascular anatomy. Carotid stenosis measurements (when applicable) are obtained utilizing NASCET criteria, using the distal internal carotid diameter as the denominator. CONTRAST:  48mL OMNIPAQUE IOHEXOL 350 MG/ML SOLN COMPARISON:  Non-contrast head CT performed earlier the same day 05/10/2017, chest CT 05/10/2017. FINDINGS: CTA NECK FINDINGS Elevation is somewhat limited due to contrast bolus timing. Aortic arch: Standard aortic branching. Atherosclerotic calcification within the visualized aortic arch and proximal major branch vessels of the neck. No hemodynamically significant innominate  or proximal subclavian artery stenosis. Right carotid system: Streak artifact from a dense right-sided contrast bolus limits evaluation of the proximal right CCA. Within this limitation, the CCA and ICA are patent within the neck without significant stenosis (50% or greater). Mild calcified plaque within the carotid bifurcation and carotid bulb. Left carotid system: CCA and ICA patent within the neck without significant stenosis (50% or greater). Mild to moderate mixed plaque within the carotid bifurcation  and carotid bulb. Vertebral arteries: Codominant and patent within the neck bilaterally without significant stenosis. Mild calcified plaque at the origin of the left vertebral artery. Skeleton: No acute bony abnormality or aggressive osseous lesion. Reversal of the expected cervical lordosis. Cervical spondylosis with multilevel disc space narrowing, posterior disc osteophyte complexes, uncovertebral and facet hypertrophy. Other neck: Heterogeneous enlarged thyroid gland extending into the superior mediastinum with multiple nodules and calcifications. A dominant right thyroid lobe nodule measures 3.5 cm (series 6, image 58). Upper chest: Emphysema. No consolidation within the imaged lung apices. Smooth interlobular septal thickening within the imaged lung apices which is nonspecific, but may reflect edema. Review of the MIP images confirms the above findings CTA HEAD FINDINGS Anterior circulation: Intracranial internal carotid arteries are patent bilaterally. Calcified plaque within these vessels bilaterally with no more than mild stenosis. The M1 middle cerebral arteries are patent without significant stenosis. There is abrupt occlusion of a proximal to mid left M2 MCA branch vessel (series 16, image 31) (series 17, image 71). The anterior cerebral arteries are patent bilaterally without significant proximal stenosis. No intracranial aneurysm is identified. Posterior circulation: The intracranial vertebral arteries are patent without significant stenosis, as is the basilar artery. The posterior cerebral arteries are patent proximally without significant stenosis. Posterior communicating arteries are hypoplastic or absent bilaterally. Venous sinuses: Within limitations of contrast timing, no convincing thrombus. Hypoplastic left transverse and sigmoid dural venous sinuses. Anatomic variants: As described Review of the MIP images confirms the above findings These results were communicated to Dr. Erlinda Hong At 6:23 pmon  6/1/2021by text page via the [facility] messaging system. IMPRESSION: CTA neck: 1. The examination is somewhat limited due to poor contrast bolus timing at the level of the neck. 2. With streak artifact from a dense right-sided contrast bolus limits evaluation of the proximal right CCA. Within this limitation, the bilateral common and internal carotid arteries are patent within the neck without significant stenosis. Atherosclerotic plaque within the carotid bifurcations and ICA bulbs bilaterally. 3. The vertebral arteries are patent within the neck bilaterally without significant stenosis. Mild calcified plaque at the origin of the left vertebral artery. 4. 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 A dominant right thyroid nodule measures 3.5 cm. Nonemergent thyroid ultrasound is recommended for further evaluation. CTA head: 1. Abrupt occlusion of a proximal to mid M2 left MCA branch vessel. 2. No other intracranial large vessel occlusion or proximal high-grade arterial stenosis is identified. 3. Mild atherosclerotic disease within the intracranial ICAs bilaterally. Electronically Signed   By: Kellie Simmering DO   On: 08/10/2019 18:38   CT HEAD CODE STROKE WO CONTRAST  Result Date: 08/10/2019 CLINICAL DATA:  Code stroke. Ataxia, stroke suspected. Additional history provided: Sudden collapse, left gaze, right hemiplegia. EXAM: CT HEAD WITHOUT CONTRAST TECHNIQUE: Contiguous axial images were obtained from the base of the skull through the vertex without intravenous contrast. COMPARISON:  Noncontrast head CT 05/10/2017 FINDINGS: Brain: Mild ill-defined hypoattenuation within the cerebral white matter is nonspecific, but consistent with chronic small vessel ischemic disease. Mild  generalized parenchymal atrophy. There is no acute intracranial hemorrhage. No demarcated cortical infarct. No extra-axial fluid collection. No evidence of intracranial mass. No midline shift. Vascular: No  hyperdense vessel.  Atherosclerotic calcifications. Skull: Normal. Negative for fracture or focal lesion. Sinuses/Orbits: Visualized orbits show no acute finding. Mild paranasal sinus mucosal thickening. No significant mastoid effusion. ASPECTS [facility] Stroke Program Early CT Score) - Ganglionic level infarction (caudate, lentiform nuclei, internal capsule, insula, M1-M3 cortex): 7 - Supraganglionic infarction (M4-M6 cortex): 3 Total score (0-10 with 10 being normal): 10 These results were communicated to Dr. Erlinda Hong At 5:57 pmon 6/1/2021by text page via the [facility] messaging system. IMPRESSION: No CT evidence of acute intracranial abnormality. Mild generalized parenchymal atrophy and chronic small vessel ischemic disease. Mild paranasal sinus mucosal thickening. Electronically Signed   By: Kellie Simmering DO   On: 08/10/2019 17:57    Labs:  CBC: Recent Labs    08/10/19 1730  WBC 12.2*  HGB 8.9*  HCT 30.5*  PLT 466*    COAGS: Recent Labs    08/10/19 1730  INR 1.1  APTT 28    BMP: Recent Labs    08/10/19 1730 08/11/19 0625  NA 143 137  K 3.3* 5.8*  CL 108 106  CO2 23 21*  GLUCOSE 131* 195*  BUN 9 9  CALCIUM 8.6* 8.1*  CREATININE 1.34* 1.32*  GFRNONAA 51* 52*  GFRAA 60* >60    LIVER FUNCTION TESTS: Recent Labs    08/10/19 1730  BILITOT 0.8  AST 28  ALT 23  ALKPHOS 80  PROT 7.3  ALBUMIN 3.3*    Assessment and Plan:  History of acute CVA s/p cerebral arteriogram with emergent mechanical thrombectomy of left MCA M3 occlusion achieving a TICI 2A revascularization via right femoral approach 08/10/2019 by Dr. Karenann Cai. Patient's condition stable- opens eyes to voice but nonverbal and does not follow simple commands, can spontaneously move left side but no movements of right side. Right groin incision stable, distal pulses (DPs) 2+ bilaterally. Further plans per neurology- appreciate and agree with management. Please call NIR with questions/concerns.      Electronically Signed: Earley Abide, PA-C 08/11/2019, 9:59 AM   I spent a total of 25 Minutes at the the patient's bedside AND on the patient's hospital floor or unit, greater than 50% of which was counseling/coordinating care for CVA s/p revascularization.

## 2019-08-11 NOTE — Evaluation (Signed)
Speech Language Pathology Evaluation Patient Details Name: Malik [name] MRN: [id number] DOB: Nov 18, 1945 Today's Date: 08/11/2019 Time: [id number] SLP Time Calculation (min) (ACUTE ONLY): 10 min  Problem List:  Patient Active Problem List   Diagnosis Date Noted   Embolic stroke ([place]) [id number]   Thrombocytosis ([place])    Acute lower UTI    Labile blood pressure    Hydrothorax    Sleep disturbance    Pleural effusion on left    Hypertensive crisis    Hypertension    Leukocytosis    Benign essential HTN    Diabetes mellitus type 2 in nonobese [facility])    Acute blood loss anemia    Trauma 05/27/2017   TBI (traumatic brain injury) [facility])    Essential hypertension    Chronic diastolic congestive heart failure ([place])    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   Rib fractures 05/10/2017   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   Past Medical History:  Past Medical History:  Diagnosis Date   Angina of effort (HCC)    Chronic diastolic CHF (congestive heart failure) ([place])    Diabetes ([place])    Diabetes mellitus without complication ([place])    Dilated cardiomyopathy ([place])    Heart disease    High blood pressure    Hypertension    LVH (left ventricular hypertrophy) due to hypertensive disease    Moderate mitral insufficiency    Neuropathy    with LE weakness   Sleep apnea    TBI (traumatic brain injury) [facility])    Past Surgical History:  Past Surgical History:  Procedure Laterality Date   BACK SURGERY     RADIOLOGY WITH ANESTHESIA N/A 08/10/2019   Procedure: IR WITH ANESTHESIA -CODE STROKE;  Surgeon: Radiologist, Medication, MD;  Location: [place];  Service: Radiology;  Laterality: N/A;   SPLENECTOMY, TOTAL N/A 05/10/2017   Procedure: TRAUMA EXPLORATORY LAP FOR SPLENECTOMY;  Surgeon: Clovis Riley, MD;  Location: [place];  Service: General;  Laterality: N/A;   HPI:  Pt is a 74 yo male presenting with acute onset R weakness and difficulty  speaking s/p tPA and L MCA thrombectomy 6/1. CT Head 6/2 showed significant progression of L MCA infarct with hypodensity in the L middle frontal gyrus extending into the L parietal lobe and L occipital lobe. BSE in 2019 recommended NPO but he progressed very quickly to regular solids/thin liquids as mentation improved. PMH includes: TBI, sleep apnea, HTN, DM, CHF, HLD, heavy alcohol use   Assessment / Plan / Recommendation Clinical Impression  Pt presents with global aphasia given severity of impairments in both expressive and receptive language skill sets. He produces some vocalizations, seemingly in response to prompts such as yes/no questions or automatic speech tasks. There are no clear words produced spontaneously or with cueing and he does not repeat at the word or sound level. He does not answer yes/no questions and needs Total A to follow one-step commands. Recommend SLP f/u acutely and at next level of care.     SLP Assessment  SLP Recommendation/Assessment: Patient needs continued Speech Lanaguage Pathology Services SLP Visit Diagnosis: Aphasia (R47.01)    Follow Up Recommendations  (tba)    Frequency and Duration min 2x/week  2 weeks      SLP Evaluation Cognition  Overall Cognitive Status: Difficult to assess(aphasia) Arousal/Alertness: Awake/alert(drowsy) Orientation Level: Other (comment)(unable to assess given aphasia) Attention: Sustained Sustained Attention: Impaired Sustained Attention Impairment: Functional basic Awareness: Impaired Awareness Impairment: Emergent impairment Problem Solving: Impaired Problem Solving  Impairment: Functional basic Safety/Judgment: Impaired       Comprehension  Auditory Comprehension Overall Auditory Comprehension: Impaired Yes/No Questions: Impaired Basic Biographical Questions: 0-25% accurate Basic Immediate Environment Questions: 0-24% accurate Commands: Impaired One Step Basic Commands: 0-24% accurate    Expression  Expression Primary Mode of Expression: Verbal Verbal Expression Overall Verbal Expression: Impaired Initiation: Impaired Automatic Speech: (none) Level of Generative/Spontaneous Verbalization: (none) Repetition: Impaired Level of Impairment: Word level Naming: Impairment Pragmatics: Impairment Impairments: Eye contact Non-Verbal Means of Communication: Not applicable   Oral / Motor  Oral Motor/Sensory Function Overall Oral Motor/Sensory Function: Moderate impairment(mild-moderate) Facial ROM: Reduced right;Suspected CN VII (facial) dysfunction Facial Symmetry: Abnormal symmetry right;Suspected CN VII (facial) dysfunction Facial Strength: Reduced right;Suspected CN VII (facial) dysfunction Facial Sensation: Reduced right;Suspected CN V (Trigeminal) dysfunction Motor Speech Overall Motor Speech: (UTA)   GO                     Osie Bond., M.A. [place] [facility] Pager [phone] Office [phone]  08/11/2019, 12:14 PM

## 2019-08-11 NOTE — Progress Notes (Addendum)
STROKE TEAM PROGRESS NOTE   INTERVAL HISTORY His RN is at the bedside.  Pt still has left gaze preference, global aphasia and right hemiplegia. No significant change from yesterday. IR not successful last night and CT repeat today showed left moderate to large MCA infarct. Pending MRI and MRA. Did not pass swallow screen, not able to put in cortrak due to within 24 h of tPA.  Vitals:   08/11/19 0645 08/11/19 0700 08/11/19 0800 08/11/19 0900  BP: (!) 166/84 (!) 177/85 (!) 172/90 (!) 178/107  Pulse: 82 82 93 91  Resp: 15 14 16 18   Temp:   98.6 F (37 C)   TempSrc:   Axillary   SpO2: 96% 97% 91% 98%  Weight:        CBC:  Recent Labs  Lab 08/10/19 1730  WBC 12.2*  NEUTROABS 6.1  HGB 8.9*  HCT 30.5*  MCV 70.1*  PLT 466*    Basic Metabolic Panel:  Recent Labs  Lab 08/10/19 1730 08/11/19 0625  NA 143 137  K 3.3* 5.8*  CL 108 106  CO2 23 21*  GLUCOSE 131* 195*  BUN 9 9  CREATININE 1.34* 1.32*  CALCIUM 8.6* 8.1*   Lipid Panel:     Component Value Date/Time   CHOL 189 08/11/2019 0625   TRIG 64 08/11/2019 0625   HDL 49 08/11/2019 0625   CHOLHDL 3.9 08/11/2019 0625   VLDL 13 08/11/2019 0625   LDLCALC 127 (H) 08/11/2019 0625   HgbA1c:  Lab Results  Component Value Date   HGBA1C 6.3 (H) 05/11/2017   Urine Drug Screen:     Component Value Date/Time   LABOPIA NONE DETECTED 08/11/2019 0343   COCAINSCRNUR NONE DETECTED 08/11/2019 0343   LABBENZ NONE DETECTED 08/11/2019 0343   AMPHETMU NONE DETECTED 08/11/2019 0343   THCU NONE DETECTED 08/11/2019 0343   LABBARB NONE DETECTED 08/11/2019 0343    Alcohol Level     Component Value Date/Time   ETH 46 (H) 08/10/2019 1730    IMAGING past 24 hours CT Code Stroke CTA Head W/WO contrast  Result Date: 08/10/2019 CLINICAL DATA:  Stroke, follow-up EXAM: CT ANGIOGRAPHY HEAD AND NECK TECHNIQUE: Multidetector CT imaging of the head and neck was performed using the standard protocol during bolus administration of intravenous  contrast. Multiplanar CT image reconstructions and MIPs were obtained to evaluate the vascular anatomy. Carotid stenosis measurements (when applicable) are obtained utilizing NASCET criteria, using the distal internal carotid diameter as the denominator. CONTRAST:  26mL OMNIPAQUE IOHEXOL 350 MG/ML SOLN COMPARISON:  Non-contrast head CT performed earlier the same day 05/10/2017, chest CT 05/10/2017. FINDINGS: CTA NECK FINDINGS Elevation is somewhat limited due to contrast bolus timing. Aortic arch: Standard aortic branching. Atherosclerotic calcification within the visualized aortic arch and proximal major branch vessels of the neck. No hemodynamically significant innominate or proximal subclavian artery stenosis. Right carotid system: Streak artifact from a dense right-sided contrast bolus limits evaluation of the proximal right CCA. Within this limitation, the CCA and ICA are patent within the neck without significant stenosis (50% or greater). Mild calcified plaque within the carotid bifurcation and carotid bulb. Left carotid system: CCA and ICA patent within the neck without significant stenosis (50% or greater). Mild to moderate mixed plaque within the carotid bifurcation and carotid bulb. Vertebral arteries: Codominant and patent within the neck bilaterally without significant stenosis. Mild calcified plaque at the origin of the left vertebral artery. Skeleton: No acute bony abnormality or aggressive osseous lesion. Reversal of the expected  cervical lordosis. Cervical spondylosis with multilevel disc space narrowing, posterior disc osteophyte complexes, uncovertebral and facet hypertrophy. Other neck: Heterogeneous enlarged thyroid gland extending into the superior mediastinum with multiple nodules and calcifications. A dominant right thyroid lobe nodule measures 3.5 cm (series 6, image 58). Upper chest: Emphysema. No consolidation within the imaged lung apices. Smooth interlobular septal thickening within the  imaged lung apices which is nonspecific, but may reflect edema. Review of the MIP images confirms the above findings CTA HEAD FINDINGS Anterior circulation: Intracranial internal carotid arteries are patent bilaterally. Calcified plaque within these vessels bilaterally with no more than mild stenosis. The M1 middle cerebral arteries are patent without significant stenosis. There is abrupt occlusion of a proximal to mid left M2 MCA branch vessel (series 16, image 31) (series 17, image 71). The anterior cerebral arteries are patent bilaterally without significant proximal stenosis. No intracranial aneurysm is identified. Posterior circulation: The intracranial vertebral arteries are patent without significant stenosis, as is the basilar artery. The posterior cerebral arteries are patent proximally without significant stenosis. Posterior communicating arteries are hypoplastic or absent bilaterally. Venous sinuses: Within limitations of contrast timing, no convincing thrombus. Hypoplastic left transverse and sigmoid dural venous sinuses. Anatomic variants: As described Review of the MIP images confirms the above findings These results were communicated to Dr. Erlinda Hong At 6:23 pmon 6/1/2021by text page via the [facility] messaging system. IMPRESSION: CTA neck: 1. The examination is somewhat limited due to poor contrast bolus timing at the level of the neck. 2. With streak artifact from a dense right-sided contrast bolus limits evaluation of the proximal right CCA. Within this limitation, the bilateral common and internal carotid arteries are patent within the neck without significant stenosis. Atherosclerotic plaque within the carotid bifurcations and ICA bulbs bilaterally. 3. The vertebral arteries are patent within the neck bilaterally without significant stenosis. Mild calcified plaque at the origin of the left vertebral artery. 4. 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 A dominant right  thyroid nodule measures 3.5 cm. Nonemergent thyroid ultrasound is recommended for further evaluation. CTA head: 1. Abrupt occlusion of a proximal to mid M2 left MCA branch vessel. 2. No other intracranial large vessel occlusion or proximal high-grade arterial stenosis is identified. 3. Mild atherosclerotic disease within the intracranial ICAs bilaterally. Electronically Signed   By: Kellie Simmering DO   On: 08/10/2019 18:38   CT HEAD WO CONTRAST  Result Date: 08/11/2019 CLINICAL DATA:  Stroke.  Left MCA thrombectomy yesterday. EXAM: CT HEAD WITHOUT CONTRAST TECHNIQUE: Contiguous axial images were obtained from the base of the skull through the vertex without intravenous contrast. COMPARISON:  CT head 08/10/2019 FINDINGS: Brain: Progressive infarction is now present in the left MCA territory. Infarct extends into the left parietal lobe and left occipital lobe. Linear density in the anterior sylvian fissure on the left likely represents a small amount of subarachnoid hemorrhage. No midline shift. Chronic infarct right cerebellum is small. Small chronic infarct right internal capsule. Ventricle size is normal. Vascular: Linear hyperdensity in the left anterior sylvian fissure may represent thrombus in a superior division of the left MCA versus a small amount of hemorrhage. Skull: Negative Sinuses/Orbits: Mucosal edema paranasal sinuses.  Negative orbit Other: None IMPRESSION: 1. There has been significant progression of left MCA infarct. There is hypodensity in the left middle frontal gyrus extending into the left parietal lobe and left occipital lobe. 2. Linear hyperdensity in the left sylvian fissure likely resent represents a small amount of subarachnoid hemorrhage. Electronically Signed  By: Franchot Gallo M.D.   On: 08/11/2019 08:47   CT Code Stroke CTA Neck W/WO contrast  Result Date: 08/10/2019 CLINICAL DATA:  Stroke, follow-up EXAM: CT ANGIOGRAPHY HEAD AND NECK TECHNIQUE: Multidetector CT imaging of the  head and neck was performed using the standard protocol during bolus administration of intravenous contrast. Multiplanar CT image reconstructions and MIPs were obtained to evaluate the vascular anatomy. Carotid stenosis measurements (when applicable) are obtained utilizing NASCET criteria, using the distal internal carotid diameter as the denominator. CONTRAST:  2mL OMNIPAQUE IOHEXOL 350 MG/ML SOLN COMPARISON:  Non-contrast head CT performed earlier the same day 05/10/2017, chest CT 05/10/2017. FINDINGS: CTA NECK FINDINGS Elevation is somewhat limited due to contrast bolus timing. Aortic arch: Standard aortic branching. Atherosclerotic calcification within the visualized aortic arch and proximal major branch vessels of the neck. No hemodynamically significant innominate or proximal subclavian artery stenosis. Right carotid system: Streak artifact from a dense right-sided contrast bolus limits evaluation of the proximal right CCA. Within this limitation, the CCA and ICA are patent within the neck without significant stenosis (50% or greater). Mild calcified plaque within the carotid bifurcation and carotid bulb. Left carotid system: CCA and ICA patent within the neck without significant stenosis (50% or greater). Mild to moderate mixed plaque within the carotid bifurcation and carotid bulb. Vertebral arteries: Codominant and patent within the neck bilaterally without significant stenosis. Mild calcified plaque at the origin of the left vertebral artery. Skeleton: No acute bony abnormality or aggressive osseous lesion. Reversal of the expected cervical lordosis. Cervical spondylosis with multilevel disc space narrowing, posterior disc osteophyte complexes, uncovertebral and facet hypertrophy. Other neck: Heterogeneous enlarged thyroid gland extending into the superior mediastinum with multiple nodules and calcifications. A dominant right thyroid lobe nodule measures 3.5 cm (series 6, image 58). Upper chest:  Emphysema. No consolidation within the imaged lung apices. Smooth interlobular septal thickening within the imaged lung apices which is nonspecific, but may reflect edema. Review of the MIP images confirms the above findings CTA HEAD FINDINGS Anterior circulation: Intracranial internal carotid arteries are patent bilaterally. Calcified plaque within these vessels bilaterally with no more than mild stenosis. The M1 middle cerebral arteries are patent without significant stenosis. There is abrupt occlusion of a proximal to mid left M2 MCA branch vessel (series 16, image 31) (series 17, image 71). The anterior cerebral arteries are patent bilaterally without significant proximal stenosis. No intracranial aneurysm is identified. Posterior circulation: The intracranial vertebral arteries are patent without significant stenosis, as is the basilar artery. The posterior cerebral arteries are patent proximally without significant stenosis. Posterior communicating arteries are hypoplastic or absent bilaterally. Venous sinuses: Within limitations of contrast timing, no convincing thrombus. Hypoplastic left transverse and sigmoid dural venous sinuses. Anatomic variants: As described Review of the MIP images confirms the above findings These results were communicated to Dr. Erlinda Hong At 6:23 pmon 6/1/2021by text page via the [facility] messaging system. IMPRESSION: CTA neck: 1. The examination is somewhat limited due to poor contrast bolus timing at the level of the neck. 2. With streak artifact from a dense right-sided contrast bolus limits evaluation of the proximal right CCA. Within this limitation, the bilateral common and internal carotid arteries are patent within the neck without significant stenosis. Atherosclerotic plaque within the carotid bifurcations and ICA bulbs bilaterally. 3. The vertebral arteries are patent within the neck bilaterally without significant stenosis. Mild calcified plaque at the origin of the left vertebral  artery. 4. 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  A dominant right thyroid nodule measures 3.5 cm. Nonemergent thyroid ultrasound is recommended for further evaluation. CTA head: 1. Abrupt occlusion of a proximal to mid M2 left MCA branch vessel. 2. No other intracranial large vessel occlusion or proximal high-grade arterial stenosis is identified. 3. Mild atherosclerotic disease within the intracranial ICAs bilaterally. Electronically Signed   By: Kellie Simmering DO   On: 08/10/2019 18:38   CT HEAD CODE STROKE WO CONTRAST  Result Date: 08/10/2019 CLINICAL DATA:  Code stroke. Ataxia, stroke suspected. Additional history provided: Sudden collapse, left gaze, right hemiplegia. EXAM: CT HEAD WITHOUT CONTRAST TECHNIQUE: Contiguous axial images were obtained from the base of the skull through the vertex without intravenous contrast. COMPARISON:  Noncontrast head CT 05/10/2017 FINDINGS: Brain: Mild ill-defined hypoattenuation within the cerebral white matter is nonspecific, but consistent with chronic small vessel ischemic disease. Mild generalized parenchymal atrophy. There is no acute intracranial hemorrhage. No demarcated cortical infarct. No extra-axial fluid collection. No evidence of intracranial mass. No midline shift. Vascular: No hyperdense vessel.  Atherosclerotic calcifications. Skull: Normal. Negative for fracture or focal lesion. Sinuses/Orbits: Visualized orbits show no acute finding. Mild paranasal sinus mucosal thickening. No significant mastoid effusion. ASPECTS [facility] Stroke Program Early CT Score) - Ganglionic level infarction (caudate, lentiform nuclei, internal capsule, insula, M1-M3 cortex): 7 - Supraganglionic infarction (M4-M6 cortex): 3 Total score (0-10 with 10 being normal): 10 These results were communicated to Dr. Erlinda Hong At 5:57 pmon 6/1/2021by text page via the [facility] messaging system. IMPRESSION: No CT evidence of acute intracranial abnormality. Mild  generalized parenchymal atrophy and chronic small vessel ischemic disease. Mild paranasal sinus mucosal thickening. Electronically Signed   By: Kellie Simmering DO   On: 08/10/2019 17:57    PHYSICAL EXAM  Temp:  [97.6 F (36.4 C)-98.6 F (37 C)] 98.6 F (37 C) (06/02 0800) Pulse Rate:  [61-93] 85 (06/02 1100) Resp:  [14-107] 16 (06/02 1100) BP: (140-211)/(67-114) 153/87 (06/02 1100) SpO2:  [90 %-100 %] 97 % (06/02 1100) Weight:  [100.1 kg] 100.1 kg (06/01 1700)  General - well nourished, well developed, in no apparent distress.    Ophthalmologic - fundi not visualized due to noncooperation.    Cardiovascular - regular rhythm and rate with frequent PVCs on tele  Neuro - awake, eyes open, but global aphasia, not following commands, non verbal. Eyes left gaze preference, not cross midline. Mild right nasolabial fold flattening. Not blinking to visual threat on the right, but able to blink to visual threat on the left. LUE and LLE spontaneous movement against gravity, but RUE and RLE flaccid. Sensation, coordination and gait not tested.  ASSESSMENT/PLAN Malik [name] is a 74 y.o. male with history of HTN, DM, HLD, heavy alcohol who had sudden onset aphasia, L gaze preference and R hemiplegia. EKG showed AF en route. High BP treated with labetalol. Received tPA 08/10/2019 at 1749.  Stroke:   L MCA infarct s/p tPA and attempted IR L [id number] occlusion, embolic secondary to unknown source   Code Stroke CT head No acute abnormality. ASPECTS 10.     CTA head proximal to mid L M2 occlusion. Mild B ICA atherosclerosis.  CTA neck B ICA bifurcation and bulbs atherosclerosis. L VA origin atherosclerosis. 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 [place] recommended  Cerebral angio L M3 superior branch occlusion w/ subocclusive filling defect.    Post IR CT Minimal contrast extravasation L M3 branch  CT 6/2 progression of L MCA infarct, hypodensity L middle frontal gyrus  extending  into L parietal and L occipital lobe L sylvian fissure SAH.  MRI  pending  MRA  pending  2D Echo pending  LE venous doppler pending  LDL 127  HgbA1c 6.9   SCDs for VTE prophylaxis  No antithrombotic prior to admission, now on No antithrombotic within 24 h of tPA.    Therapy recommendations:  pending   Disposition:  pending   Hypertensive Emergency  SBP 211/133 on arrival  Home meds:  norvasc 10, lasix 20, hydralazine 100 q8, metoprolol 25 bid  Labetalol prior to tPA  Now on cleviprex gtt    Stable . BP goal < 180/105 d/t  Minimal revascularization post IR . Long-term BP goal normotensive  Hyperlipidemia  Home meds:  lipitor 40  LDL 127, goal < 70  Resume higher dose of statin once po access  Continue statin at discharge  Diabetes type II, hyperglycemia   Home meds:  Glipizide 10, metformin 500 bid  HgbA1c 6.9, goal < 7.0  CBGs   SSI  PCP follow up  ?? Afib  EMS EEG showed probably afib  So far tele in hospital not in afib  Pt will need loop recorder to rule out afib if his neuro status improves  Dysphagia . Secondary to stroke . NPO . Speech on board . 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 [place] recommended  Leukocytosis 12.2 ->11.7 (afebrile)   Hyperkalemia 3.3->5.8 ? Hemolysis   CKD 1.34->1.32  Hospital day # 1  This patient is critically ill due to left  MCA large infarct, cerebral edema, alcohol abusse and at significant risk of neurological worsening, death form recurrent stroke, brain herniation, hemorrhagic  Conversion, delirium tremor, aspiration. This patient's care requires constant monitoring of vital signs, hemodynamics, respiratory and cardiac monitoring, review of multiple databases, neurological assessment, discussion with family, other specialists and medical decision making of high complexity. I spent 35 minutes of neurocritical care time in the care of this patient. I had long discussion with Son Dannielle Huh over the phone (son Taelin was not available), updated pt current condition, treatment plan and potential prognosis, and answered all the questions. He expressed understanding and appreciation.    Rosalin Hawking, MD PhD Stroke Neurology 08/11/2019 4:29 PM     To contact Stroke Continuity provider, please refer to [email or web address] After hours, contact General Neurology

## 2019-08-11 NOTE — Progress Notes (Addendum)
PT Cancellation Note  Patient Details Name: Malik [name] MRN: [id number] DOB: January 08, 1946   Cancelled Treatment:    Reason Eval/Treat Not Completed: Active bedrest order. Pt receiving tPA last night and is on strict bedrest at this time. Will attempt to follow up tomorrow if medically appropriate to mobilize.   Zenaida Niece 08/11/2019, 9:48 AM

## 2019-08-12 ENCOUNTER — Inpatient Hospital Stay (HOSPITAL_COMMUNITY): Payer: Medicare Other

## 2019-08-12 DIAGNOSIS — I639 Cerebral infarction, unspecified: Secondary | ICD-10-CM

## 2019-08-12 LAB — CBC
HCT: 30.1 % — ABNORMAL LOW (ref 39.0–52.0)
Hemoglobin: 10.2 g/dL — ABNORMAL LOW (ref 13.0–17.0)
MCH: 23.4 pg — ABNORMAL LOW (ref 26.0–34.0)
MCHC: 33.9 g/dL (ref 30.0–36.0)
MCV: 69 fL — ABNORMAL LOW (ref 80.0–100.0)
Platelets: 374 10*3/uL (ref 150–400)
RBC: 4.36 MIL/uL (ref 4.22–5.81)
RDW: 20.6 % — ABNORMAL HIGH (ref 11.5–15.5)
WBC: 14.5 10*3/uL — ABNORMAL HIGH (ref 4.0–10.5)
nRBC: 0.5 % — ABNORMAL HIGH (ref 0.0–0.2)

## 2019-08-12 LAB — BASIC METABOLIC PANEL
Anion gap: 12 (ref 5–15)
BUN: 9 mg/dL (ref 8–23)
CO2: 24 mmol/L (ref 22–32)
Calcium: 8.1 mg/dL — ABNORMAL LOW (ref 8.9–10.3)
Chloride: 98 mmol/L (ref 98–111)
Creatinine, Ser: 0.92 mg/dL (ref 0.61–1.24)
GFR calc Af Amer: >60 mL/min (ref >60)
GFR calc non Af Amer: >60 mL/min (ref >60)
Glucose, Bld: 135 mg/dL — ABNORMAL HIGH (ref 70–99)
Potassium: 4.9 mmol/L (ref 3.5–5.1)
Sodium: 134 mmol/L — ABNORMAL LOW (ref 135–145)

## 2019-08-12 LAB — GLUCOSE, CAPILLARY
Glucose-Capillary: 118 mg/dL — ABNORMAL HIGH (ref 70–99)
Glucose-Capillary: 132 mg/dL — ABNORMAL HIGH (ref 70–99)
Glucose-Capillary: 107 mg/dL — ABNORMAL HIGH (ref 70–99)
Glucose-Capillary: 109 mg/dL — ABNORMAL HIGH (ref 70–99)
Glucose-Capillary: 132 mg/dL — ABNORMAL HIGH (ref 70–99)
Glucose-Capillary: 104 mg/dL — ABNORMAL HIGH (ref 70–99)

## 2019-08-12 MED ORDER — ASPIRIN 300 MG RE SUPP
300.0000 mg | Freq: Every day | RECTAL | Status: DC
  Administered 2019-08-12 – 2019-08-13 (×2): 300 mg via RECTAL
  Filled 2019-08-12 (×2): qty 1

## 2019-08-12 MED ORDER — SODIUM CHLORIDE 0.9 % IV SOLN
1.0000 g | INTRAVENOUS | Status: AC
  Administered 2019-08-12 – 2019-08-16 (×5): 1 g via INTRAVENOUS
  Filled 2019-08-12: qty 10
  Filled 2019-08-12: qty 1
  Filled 2019-08-12 (×4): qty 10

## 2019-08-12 NOTE — Progress Notes (Signed)
STROKE TEAM PROGRESS NOTE   INTERVAL HISTORY PT, OT and RN are at the bedside. PT/OT working with pt and he was sitting in the edge of bed with assistance. Pt still has global aphasia, right hemiplegia and left gaze. BP stable but around 180s, still on cleviprex. Did not pass swallow and will need cortrak tomorrow and then po BP meds.    Vitals:   08/12/19 0730 08/12/19 0800 08/12/19 0900 08/12/19 1000  BP: (!) 159/80 (!) 157/83 (!) 165/84 (!) 179/98  Pulse: 89 91 97 (!) 104  Resp:  18 20 (!) 26  Temp:  99 F (37.2 C)    TempSrc:  Axillary    SpO2:  95% 95% 100%  Weight:       CBC:  Recent Labs  Lab 08/10/19 1730 08/10/19 1732 08/11/19 1020 08/12/19 0651  WBC 12.2*   < > 11.7* 14.5*  NEUTROABS 6.1  --   --   --   HGB 8.9*   < > 8.5* 10.2*  HCT 30.5*   < > 28.7* 30.1*  MCV 70.1*   < > 69.7* 69.0*  PLT 466*   < > 387 374   < > = values in this interval not displayed.   Basic Metabolic Panel:  Recent Labs  Lab 08/11/19 0625 08/12/19 0651  NA 137 134*  K 5.8* 4.9  CL 106 98  CO2 21* 24  GLUCOSE 195* 135*  BUN 9 9  CREATININE 1.32* 0.92  CALCIUM 8.1* 8.1*   Lipid Panel:     Component Value Date/Time   CHOL 189 08/11/2019 0625   TRIG 64 08/11/2019 0625   HDL 49 08/11/2019 0625   CHOLHDL 3.9 08/11/2019 0625   VLDL 13 08/11/2019 0625   LDLCALC 127 (H) 08/11/2019 0625   HgbA1c:  Lab Results  Component Value Date   HGBA1C 5.4 08/11/2019   Urine Drug Screen:     Component Value Date/Time   LABOPIA NONE DETECTED 08/11/2019 0343   COCAINSCRNUR NONE DETECTED 08/11/2019 0343   LABBENZ NONE DETECTED 08/11/2019 0343   AMPHETMU NONE DETECTED 08/11/2019 0343   THCU NONE DETECTED 08/11/2019 0343   LABBARB NONE DETECTED 08/11/2019 0343    Alcohol Level     Component Value Date/Time   ETH 46 (H) 08/10/2019 1730    IMAGING past 24 hours MR ANGIO HEAD WO CONTRAST  Result Date: 08/11/2019 CLINICAL DATA:  Stroke post thrombectomy, follow-up EXAM: MRI HEAD WITHOUT  CONTRAST MRA HEAD WITHOUT CONTRAST TECHNIQUE: Multiplanar, multiecho pulse sequences of the brain and surrounding structures were obtained without intravenous contrast. Angiographic images of the head were obtained using MRA technique without contrast. COMPARISON:  Correlation made with prior CT imaging FINDINGS: MRI HEAD Brain: Large evolving acute left MCA territory infarction involving frontoparietal greater than temporal lobes as well as the anterior insula. There is also some left MCA/ACA and MCA/PCA watershed involvement as well as left ACA involvement. Areas of susceptibility likely reflecting petechial reperfusion hemorrhage. There is no significant mass effect. 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 Small chronic right cerebellar infarcts. Additional patchy T2 hyperintensity in the supratentorial white matter is nonspecific but probably reflects mild chronic microvascular ischemic changes. There is no intracranial mass or hydrocephalus. Vascular: Major vessel flow voids at the skull base are preserved. Skull and upper cervical spine: Normal marrow signal is preserved. Sinuses/Orbits: Mild mucosal thickening. Orbits are unremarkable. Other: Sella is unremarkable. Mastoid air cells  are clear. MRA HEAD Motion artifact is present. Intracranial internal carotid arteries are patent with atherosclerotic irregularity. Left M1 MCA is patent. There appears to be now more proximal left M2 MCA branch occlusion within limitation of motion. Proximal right middle and both prox anterior cerebral arteries are patent. Intracranial vertebral arteries, basilar artery, proximal posterior cerebral arteries are patent. 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 There is also some watershed involvement with the ACA and PCA as well some left ACA  involvement. No significant mass effect. Chronic findings detailed above. Motion degraded vascular imaging. Suspected more now proximal left M2 MCA branch occlusion. Electronically Signed   By: Macy Mis M.D.   On: 08/11/2019 17:22   MR BRAIN WO CONTRAST  Result Date: 08/11/2019 CLINICAL DATA:  Stroke post thrombectomy, follow-up EXAM: MRI HEAD WITHOUT CONTRAST MRA HEAD WITHOUT CONTRAST TECHNIQUE: Multiplanar, multiecho pulse sequences of the brain and surrounding structures were obtained without intravenous contrast. Angiographic images of the head were obtained using MRA technique without contrast. COMPARISON:  Correlation made with prior CT imaging FINDINGS: MRI HEAD Brain: Large evolving acute left MCA territory infarction involving frontoparietal greater than temporal lobes as well as the anterior insula. There is also some left MCA/ACA and MCA/PCA watershed involvement as well as left ACA involvement. Areas of susceptibility likely reflecting petechial reperfusion hemorrhage. There is no significant mass effect. 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 Small chronic right cerebellar infarcts. Additional patchy T2 hyperintensity in the supratentorial white matter is nonspecific but probably reflects mild chronic microvascular ischemic changes. There is no intracranial mass or hydrocephalus. Vascular: Major vessel flow voids at the skull base are preserved. Skull and upper cervical spine: Normal marrow signal is preserved. Sinuses/Orbits: Mild mucosal thickening. Orbits are unremarkable. Other: Sella is unremarkable. Mastoid air cells are clear. MRA HEAD Motion artifact is present. Intracranial internal carotid arteries are patent with atherosclerotic irregularity. Left M1 MCA is patent. There appears to be now more proximal left M2 MCA branch occlusion within limitation of motion. Proximal right middle and  both prox anterior cerebral arteries are patent. Intracranial vertebral arteries, basilar artery, proximal posterior cerebral arteries are patent. 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 There is also some watershed involvement with the ACA and PCA as well some left ACA involvement. No significant mass effect. Chronic findings detailed above. Motion degraded vascular imaging. Suspected more now proximal left M2 MCA branch occlusion. Electronically Signed   By: Macy Mis M.D.   On: 08/11/2019 17:22    PHYSICAL EXAM    Temp:  [97.5 F (36.4 C)-99.5 F (37.5 C)] 99 F (37.2 C) (06/03 0800) Pulse Rate:  [85-104] 104 (06/03 1000) Resp:  [14-26] 26 (06/03 1000) BP: (132-182)/(74-100) 179/98 (06/03 1000) SpO2:  [94 %-100 %] 100 % (06/03 1000)  General - well nourished, well developed, in no apparent distress.    Ophthalmologic - fundi not visualized due to noncooperation.    Cardiovascular - regular rhythm and rate with frequent PVCs on tele  Neuro - awake, eyes open, but global aphasia, not following commands, non verbal. Eyes left gaze preference, not cross midline. Mild right nasolabial fold flattening. Not blinking to visual threat on the right, but able to blink to visual threat on the left. LUE and LLE spontaneous movement against gravity, but RUE and RLE flaccid. Sensation,  coordination and gait not tested.  ASSESSMENT/PLAN Mr. Jeffray Stoney is a 74 y.o. male with history of HTN, DM, HLD, heavy alcohol who had sudden onset aphasia, L gaze preference and R hemiplegia. EKG showed AF en route. High BP treated with labetalol. Received tPA 08/10/2019 at 1749.  Stroke:   L MCA infarct s/p tPA and attempted IR L [id number] occlusion, embolic secondary to unknown source   Code Stroke CT head No acute abnormality. ASPECTS 10.     CTA head proximal to mid L M2 occlusion. Mild B ICA  atherosclerosis.  CTA neck B ICA bifurcation and bulbs atherosclerosis. L VA origin atherosclerosis. 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 [place] recommended  Cerebral angio L M3 superior branch occlusion w/ subocclusive filling defect.    Post IR CT Minimal contrast extravasation L M3 branch  CT 6/2 progression of L MCA infarct, hypodensity L middle frontal gyrus extending into L parietal and L occipital lobe L sylvian fissure SAH.  MRI  Large evolving L MCA infarct w/ hemorrhagic transformation w/o discrete hematoma. Also L ACA infarct and ACA/PCA watershed territory. No significant mass effect.   MRA  Motion. Proximal L M2 branch occlusion   2D Echo EF 50-55%. No source of embolus. B atrial mildly dilated.  LE venous doppler pending   LDL 127  HgbA1c 5.4  SCDs for VTE prophylaxis  No antithrombotic prior to admission, on ASA 300 PR now. No DAPT due to petechial HT.   Therapy recommendations:  pending   Disposition:  pending   Hypertensive Emergency  SBP 211/133 on arrival  Home meds:  norvasc 10, lasix 20, hydralazine 100 q8, metoprolol 25 bid  Labetalol prior to tPA  On cleviprex gtt    Stable . SBP goal < 180 . Will add po BP meds once po access . Long-term BP goal normotensive  Hyperlipidemia  Home meds:  lipitor 40  LDL 127, goal < 70  Consider statin once po access   Resume statin at discharge   Diabetes type II, hyperglycemia   Home meds:  Glipizide 10, metformin 500 bid  HgbA1c 5.4, goal < 7.0  CBGs   SSI  PCP follow up  ?? Afib  EMS ECG showed probably afib  So far tele in hospital not in afib  Pt will need loop recorder to rule out afib if his neuro status improves  Dysphagia . Secondary to stroke . NPO . Speech on board . Consider to place cortrak tomorrow   Leukocytosis  Probable UTI   WBC 12.2 ->11.7->14.5 (afebrile)   UA large LE, 21-50WBC, few bacteria.  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 [place] recommended  Hyperkalemia 3.3->5.8->4.9 - resolved  CKD 1.34->1.32->0.92 - stable  Hospital day # 2  Patient continues to be critically ill for the last 24 hours, has developed leukocytosis and possible UTI, continues to have left gaze,  right hemiplegia and global aphasia, and I added IV Abx, reviewed MRI and put on ASA. Will do urine culture and follow up with speech, will consider cortrak in am.    This patient is critically ill due to left MCA large infarct, cerebral edema, alcohol abusse and at significant risk of neurological worsening, death form recurrent stroke, brain herniation, hemorrhagic  Conversion, delirium tremor, aspiration. This patient's care requires constant monitoring of vital signs, hemodynamics, respiratory and cardiac monitoring, review of multiple databases, neurological assessment, discussion with family, other specialists and medical decision making of high complexity. I spent 35 minutes of neurocritical care time in the care of this patient.   Rosalin Hawking, MD PhD Stroke Neurology 08/12/2019 10:57 AM     To contact Stroke Continuity provider, please refer to [email or web address] After hours, contact General Neurology

## 2019-08-12 NOTE — Progress Notes (Signed)
  Speech Language Pathology Treatment: Dysphagia  Patient Details Name: Malik [name] MRN: [id number] DOB: 1945-09-13 Today's Date: 08/12/2019 Time: [id number] SLP Time Calculation (min) (ACUTE ONLY): 14 min  Assessment / Plan / Recommendation Clinical Impression  Pt is drowsier this morning and showing less signs of awareness for PO intake. When given tactile cues, pt lifted his head for repositioning x1. No further command following noted this session. He had oral holding of ice chips followed by coughing in the absence of any hyolaryngeal movement. Additional trials of ice and honey thick liquids were attempted but without oral opening to accept them. Would remain NPO with consideration of temporary alternative means of nutrition.    HPI HPI: Pt is a 74 yo male presenting with acute onset R weakness and difficulty speaking s/p tPA and L MCA thrombectomy 6/1. CT Head 6/2 showed significant progression of L MCA infarct with hypodensity in the L middle frontal gyrus extending into the L parietal lobe and L occipital lobe. BSE in 2019 recommended NPO but he progressed very quickly to regular solids/thin liquids as mentation improved. PMH includes: TBI, sleep apnea, HTN, DM, CHF, HLD, heavy alcohol use      SLP Plan  Continue with current plan of care       Recommendations  Diet recommendations: NPO Medication Administration: Via alternative means                Oral Care Recommendations: Oral care QID Follow up Recommendations: 24 hour supervision/assistance;Other (comment)(SLP f/u at next level of care) SLP Visit Diagnosis: Dysphagia, oropharyngeal phase (R13.12) Plan: Continue with current plan of care       GO                 Osie Bond., M.A. [facility] Pager [phone] Office [phone]  08/12/2019, 9:10 AM

## 2019-08-12 NOTE — ED Provider Notes (Signed)
[facility] NEURO/TRAUMA/SURGICAL ICU Provider Note   CSN: [id number] Arrival date & time: 08/10/19  1725     History   Malik [name] is a 74 y.o. male.  Chief complaint: right weakness/stroke  Patient presents via EMS with acute onset aphasia and right-sided weakness. Was last seen normal at/around 1630 when was noted to fall and have acute onset being unable to communicated or move right side. Patient aphasic, unable to provide additional history - level 5 caveat. Patient arrives as code stroke activation.   The history is provided by the patient and the EMS personnel. The history is limited by the condition of the patient.       Past Medical History:  Diagnosis Date   Angina of effort ([place])    Chronic diastolic CHF (congestive heart failure) (HCC)    Diabetes ([place])    Diabetes mellitus without complication ([place])    Dilated cardiomyopathy ([place])    Heart disease    High blood pressure    Hypertension    LVH (left ventricular hypertrophy) due to hypertensive disease    Moderate mitral insufficiency    Neuropathy    with LE weakness   Sleep apnea    TBI (traumatic brain injury) [facility])     Patient Active Problem List   Diagnosis Date Noted   Embolic stroke ([place]) 08/67/6195   Thrombocytosis ([place])    Acute lower UTI    Labile blood pressure    Hydrothorax    Sleep disturbance    Pleural effusion on left    Hypertensive crisis    Hypertension    Leukocytosis    Benign essential HTN    Diabetes mellitus type 2 in nonobese (HCC)    Acute blood loss anemia    Trauma 05/27/2017   TBI (traumatic brain injury) ([place])    Essential hypertension    Chronic diastolic congestive heart failure ([place])    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   Rib fractures 05/10/2017   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    Past Surgical History:  Procedure Laterality Date   BACK SURGERY     IR CT HEAD LTD  08/10/2019   IR PERCUTANEOUS ART  THROMBECTOMY/INFUSION INTRACRANIAL INC DIAG ANGIO  08/10/2019   RADIOLOGY WITH ANESTHESIA N/A 08/10/2019   Procedure: IR WITH ANESTHESIA -CODE STROKE;  Surgeon: Radiologist, Medication, MD;  Location: [place];  Service: Radiology;  Laterality: N/A;   SPLENECTOMY, TOTAL N/A 05/10/2017   Procedure: TRAUMA EXPLORATORY LAP FOR SPLENECTOMY;  Surgeon: Clovis Riley, MD;  Location: [facility] OR;  Service: General;  Laterality: N/A;       Family History  Problem Relation Age of Onset   Hypertension Mother    Hypertension Unknown     Social History   Tobacco Use   Smoking status: Former Smoker    Packs/day: 1.50    Years: 30.00    Pack years: 45.00    Types: Cigarettes   Smokeless tobacco: Never Used  Substance Use Topics   Alcohol use: Yes   Drug use: No    Home Medications Prior to Admission medications   Medication Sig Start Date End Date Taking? Authorizing Provider  acetaminophen (TYLENOL) 325 MG tablet Take 1-2 tablets (325-650 mg total) by mouth every 4 (four) hours as needed for mild pain. 06/06/17   Love, Ivan Anchors, PA-C  amLODipine (NORVASC) 10 MG tablet Take 1 tablet (10 mg total) by mouth daily. 06/06/17   Love, Ivan Anchors, PA-C  ascorbic acid (VITAMIN C)  250 MG tablet Take 1 tablet (250 mg total) by mouth daily. 06/07/17   Love, Ivan Anchors, PA-C  atorvastatin (LIPITOR) 40 MG tablet Take 1 tablet (40 mg total) by mouth daily. 06/06/17   Love, Ivan Anchors, PA-C  calcium-vitamin D (OSCAL WITH D) 500-200 MG-UNIT tablet Take 1 tablet by mouth 2 (two) times daily. 06/06/17   Love, Ivan Anchors, PA-C  ferrous sulfate 325 (65 FE) MG tablet Take 1 tablet (325 mg total) by mouth daily with breakfast. 06/07/17   Love, Ivan Anchors, PA-C  folic acid (FOLVITE) 1 MG tablet Take 1 tablet (1 mg total) by mouth daily. 06/06/17   Love, Ivan Anchors, PA-C  furosemide (LASIX) 20 MG tablet Take 1 tablet (20 mg total) by mouth daily. 06/07/17   Love, Ivan Anchors, PA-C  glipiZIDE (GLUCOTROL XL) 10 MG 24 hr tablet Take 1 tablet  (10 mg total) by mouth daily with breakfast. 06/06/17   Love, Ivan Anchors, PA-C  hydrALAZINE (APRESOLINE) 100 MG tablet Take 1 tablet (100 mg total) by mouth every 8 (eight) hours. 06/06/17   Love, Ivan Anchors, PA-C  metFORMIN (GLUCOPHAGE) 500 MG tablet Take 1 tablet (500 mg total) by mouth 2 (two) times daily with a meal. 06/07/17   Love, Ivan Anchors, PA-C  methocarbamol (ROBAXIN) 500 MG tablet Take 1 tablet (500 mg total) by mouth every 6 (six) hours as needed for muscle spasms. 06/06/17   Love, Ivan Anchors, PA-C  metoprolol tartrate (LOPRESSOR) 25 MG tablet Take 1 tablet (25 mg total) by mouth 2 (two) times daily. 06/06/17   Love, Ivan Anchors, PA-C  Multiple Vitamins-Iron (MULTIVITAMINS WITH IRON) TABS tablet Take 1 tablet by mouth daily. 06/06/17   Love, Ivan Anchors, PA-C  pantoprazole (PROTONIX) 40 MG tablet Take 1 tablet (40 mg total) by mouth daily. 06/07/17   Love, Ivan Anchors, PA-C  polyethylene glycol (MIRALAX / GLYCOLAX) packet Take 17 g by mouth 2 (two) times daily. 06/06/17   Love, Ivan Anchors, PA-C  thiamine 100 MG tablet Take 1 tablet (100 mg total) by mouth daily. 06/06/17   Love, Ivan Anchors, PA-C  traMADol (ULTRAM) 50 MG tablet Take 1 tablet (50 mg total) by mouth every 6 (six) hours as needed for moderate pain. 06/06/17   Love, Ivan Anchors, PA-C  traZODone (DESYREL) 50 MG tablet Take 1 tablet (50 mg total) by mouth at bedtime as needed for sleep. 06/06/17   Bary Leriche, PA-C    Allergies    Patient has no known allergies.  Review of Systems   Review of Systems  Unable to perform ROS: Patient nonverbal  level 5 caveat, stroke/aphasia.     Physical Exam Updated Vital Signs BP (!) 160/81    Pulse 93    Temp 99.3 F (37.4 C) (Axillary)    Resp 18    Wt 100.1 kg    SpO2 96%    BMI 30.78 kg/m   Physical Exam Vitals and nursing note reviewed.  Constitutional:      Appearance: Normal appearance. He is well-developed.  HENT:     Head: Atraumatic.     Nose: Nose normal.     Mouth/Throat:     Mouth: Mucous  membranes are moist.     Pharynx: Oropharynx is clear.  Eyes:     General: No scleral icterus.    Conjunctiva/sclera: Conjunctivae normal.     Pupils: Pupils are equal, round, and reactive to light.  Neck:     Vascular: No carotid bruit.  Trachea: No tracheal deviation.  Cardiovascular:     Rate and Rhythm: Normal rate and regular rhythm.     Pulses: Normal pulses.     Heart sounds: Normal heart sounds. No murmur. No friction rub. No gallop.   Pulmonary:     Effort: Pulmonary effort is normal. No accessory muscle usage or respiratory distress.     Breath sounds: Normal breath sounds.  Abdominal:     General: Bowel sounds are normal. There is no distension.     Palpations: Abdomen is soft.     Tenderness: There is no abdominal tenderness. There is no guarding.  Genitourinary:    Comments: No cva tenderness. Musculoskeletal:        General: No swelling.     Cervical back: Normal range of motion and neck supple. No rigidity.     Comments: CTLS spine, non tender, aligned, no step off.   Skin:    General: Skin is warm and dry.     Findings: No rash.  Neurological:     Mental Status: He is alert.     Comments: Alert appearing. Aphasic. Right hemiplegia.   Psychiatric:        Mood and Affect: Mood normal.      ED Results / Procedures / Treatments   Labs (all labs ordered are listed, but only abnormal results are displayed) Labs Reviewed  ETHANOL - Abnormal; Notable for the following components:      Result Value   Alcohol, Ethyl (B) 46 (*)    All other components within normal limits  CBC - Abnormal; Notable for the following components:   WBC 12.2 (*)    Hemoglobin 8.9 (*)    HCT 30.5 (*)    MCV 70.1 (*)    MCH 20.5 (*)    MCHC 29.2 (*)    RDW 21.2 (*)    Platelets 466 (*)    All other components within normal limits  DIFFERENTIAL - Abnormal; Notable for the following components:   Lymphs Abs 4.8 (*)    Basophils Absolute 0.4 (*)    All other components within  normal limits  COMPREHENSIVE METABOLIC PANEL - Abnormal; Notable for the following components:   Potassium 3.3 (*)    Glucose, Bld 131 (*)    Creatinine, Ser 1.34 (*)    Calcium 8.6 (*)    Albumin 3.3 (*)    GFR calc non Af Amer 51 (*)    GFR calc Af Amer 60 (*)    All other components within normal limits  URINALYSIS, ROUTINE W REFLEX MICROSCOPIC - Abnormal; Notable for the following components:   Color, Urine STRAW (*)    Glucose, UA >=500 (*)    Hgb urine dipstick SMALL (*)    Protein, ur 30 (*)    Leukocytes,Ua LARGE (*)    Bacteria, UA FEW (*)    All other components within normal limits  LIPID PANEL - Abnormal; Notable for the following components:   LDL Cholesterol 127 (*)    All other components within normal limits  BASIC METABOLIC PANEL - Abnormal; Notable for the following components:   Potassium 5.8 (*)    CO2 21 (*)    Glucose, Bld 195 (*)    Creatinine, Ser 1.32 (*)    Calcium 8.1 (*)    GFR calc non Af Amer 52 (*)    All other components within normal limits  CBC - Abnormal; Notable for the following components:   WBC 11.7 (*)  RBC 4.12 (*)    Hemoglobin 8.5 (*)    HCT 28.7 (*)    MCV 69.7 (*)    MCH 20.6 (*)    MCHC 29.6 (*)    RDW 21.0 (*)    nRBC 0.3 (*)    All other components within normal limits  HEMOGLOBIN A1C - Abnormal; Notable for the following components:   Hgb A1c MFr Bld 6.9 (*)    All other components within normal limits  CBC - Abnormal; Notable for the following components:   WBC 14.5 (*)    Hemoglobin 10.2 (*)    HCT 30.1 (*)    MCV 69.0 (*)    MCH 23.4 (*)    RDW 20.6 (*)    nRBC 0.5 (*)    All other components within normal limits  GLUCOSE, CAPILLARY - Abnormal; Notable for the following components:   Glucose-Capillary 117 (*)    All other components within normal limits  GLUCOSE, CAPILLARY - Abnormal; Notable for the following components:   Glucose-Capillary 144 (*)    All other components within normal limits  GLUCOSE,  CAPILLARY - Abnormal; Notable for the following components:   Glucose-Capillary 105 (*)    All other components within normal limits  GLUCOSE, CAPILLARY - Abnormal; Notable for the following components:   Glucose-Capillary 147 (*)    All other components within normal limits  GLUCOSE, CAPILLARY - Abnormal; Notable for the following components:   Glucose-Capillary 118 (*)    All other components within normal limits  POCT I-STAT, CHEM 8 - Abnormal; Notable for the following components:   Potassium 3.4 (*)    Creatinine, Ser 1.40 (*)    Glucose, Bld 133 (*)    Calcium, Ion 1.02 (*)    Hemoglobin 12.2 (*)    HCT 36.0 (*)    All other components within normal limits  SARS CORONAVIRUS 2 BY RT PCR (HOSPITAL ORDER, [facility])  MRSA PCR SCREENING  PROTIME-INR  APTT  RAPID URINE DRUG SCREEN, HOSP PERFORMED  HEMOGLOBIN I7O  BASIC METABOLIC PANEL  I-STAT CHEM 8, ED    EKG None  Radiology CT Code Stroke CTA Head W/WO contrast  Result Date: 08/10/2019 CLINICAL DATA:  Stroke, follow-up EXAM: CT ANGIOGRAPHY HEAD AND NECK TECHNIQUE: Multidetector CT imaging of the head and neck was performed using the standard protocol during bolus administration of intravenous contrast. Multiplanar CT image reconstructions and MIPs were obtained to evaluate the vascular anatomy. Carotid stenosis measurements (when applicable) are obtained utilizing NASCET criteria, using the distal internal carotid diameter as the denominator. CONTRAST:  13m OMNIPAQUE IOHEXOL 350 MG/ML SOLN COMPARISON:  Non-contrast head CT performed earlier the same day 05/10/2017, chest CT 05/10/2017. FINDINGS: CTA NECK FINDINGS Elevation is somewhat limited due to contrast bolus timing. Aortic arch: Standard aortic branching. Atherosclerotic calcification within the visualized aortic arch and proximal major branch vessels of the neck. No hemodynamically significant innominate or proximal subclavian artery stenosis.  Right carotid system: Streak artifact from a dense right-sided contrast bolus limits evaluation of the proximal right CCA. Within this limitation, the CCA and ICA are patent within the neck without significant stenosis (50% or greater). Mild calcified plaque within the carotid bifurcation and carotid bulb. Left carotid system: CCA and ICA patent within the neck without significant stenosis (50% or greater). Mild to moderate mixed plaque within the carotid bifurcation and carotid bulb. Vertebral arteries: Codominant and patent within the neck bilaterally without significant stenosis. Mild calcified plaque at the  origin of the left vertebral artery. Skeleton: No acute bony abnormality or aggressive osseous lesion. Reversal of the expected cervical lordosis. Cervical spondylosis with multilevel disc space narrowing, posterior disc osteophyte complexes, uncovertebral and facet hypertrophy. Other neck: Heterogeneous enlarged thyroid gland extending into the superior mediastinum with multiple nodules and calcifications. A dominant right thyroid lobe nodule measures 3.5 cm (series 6, image 58). Upper chest: Emphysema. No consolidation within the imaged lung apices. Smooth interlobular septal thickening within the imaged lung apices which is nonspecific, but may reflect edema. Review of the MIP images confirms the above findings CTA HEAD FINDINGS Anterior circulation: Intracranial internal carotid arteries are patent bilaterally. Calcified plaque within these vessels bilaterally with no more than mild stenosis. The M1 middle cerebral arteries are patent without significant stenosis. There is abrupt occlusion of a proximal to mid left M2 MCA branch vessel (series 16, image 31) (series 17, image 71). The anterior cerebral arteries are patent bilaterally without significant proximal stenosis. No intracranial aneurysm is identified. Posterior circulation: The intracranial vertebral arteries are patent without significant  stenosis, as is the basilar artery. The posterior cerebral arteries are patent proximally without significant stenosis. Posterior communicating arteries are hypoplastic or absent bilaterally. Venous sinuses: Within limitations of contrast timing, no convincing thrombus. Hypoplastic left transverse and sigmoid dural venous sinuses. Anatomic variants: As described Review of the MIP images confirms the above findings These results were communicated to Dr. Erlinda Hong At 6:23 pmon 6/1/2021by text page via the [facility] messaging system. IMPRESSION: CTA neck: 1. The examination is somewhat limited due to poor contrast bolus timing at the level of the neck. 2. With streak artifact from a dense right-sided contrast bolus limits evaluation of the proximal right CCA. Within this limitation, the bilateral common and internal carotid arteries are patent within the neck without significant stenosis. Atherosclerotic plaque within the carotid bifurcations and ICA bulbs bilaterally. 3. The vertebral arteries are patent within the neck bilaterally without significant stenosis. Mild calcified plaque at the origin of the left vertebral artery. 4. 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 A dominant right thyroid nodule measures 3.5 cm. Nonemergent thyroid ultrasound is recommended for further evaluation. CTA head: 1. Abrupt occlusion of a proximal to mid M2 left MCA branch vessel. 2. No other intracranial large vessel occlusion or proximal high-grade arterial stenosis is identified. 3. Mild atherosclerotic disease within the intracranial ICAs bilaterally. Electronically Signed   By: Kellie Simmering DO   On: 08/10/2019 18:38   CT HEAD WO CONTRAST  Result Date: 08/11/2019 CLINICAL DATA:  Stroke.  Left MCA thrombectomy yesterday. EXAM: CT HEAD WITHOUT CONTRAST TECHNIQUE: Contiguous axial images were obtained from the base of the skull through the vertex without intravenous contrast. COMPARISON:  CT head 08/10/2019  FINDINGS: Brain: Progressive infarction is now present in the left MCA territory. Infarct extends into the left parietal lobe and left occipital lobe. Linear density in the anterior sylvian fissure on the left likely represents a small amount of subarachnoid hemorrhage. No midline shift. Chronic infarct right cerebellum is small. Small chronic infarct right internal capsule. Ventricle size is normal. Vascular: Linear hyperdensity in the left anterior sylvian fissure may represent thrombus in a superior division of the left MCA versus a small amount of hemorrhage. Skull: Negative Sinuses/Orbits: Mucosal edema paranasal sinuses.  Negative orbit Other: None IMPRESSION: 1. There has been significant progression of left MCA infarct. There is hypodensity in the left middle frontal gyrus extending into the left parietal lobe and left occipital lobe.  2. Linear hyperdensity in the left sylvian fissure likely resent represents a small amount of subarachnoid hemorrhage. Electronically Signed   By: Franchot Gallo M.D.   On: 08/11/2019 08:47   CT Code Stroke CTA Neck W/WO contrast  Result Date: 08/10/2019 CLINICAL DATA:  Stroke, follow-up EXAM: CT ANGIOGRAPHY HEAD AND NECK TECHNIQUE: Multidetector CT imaging of the head and neck was performed using the standard protocol during bolus administration of intravenous contrast. Multiplanar CT image reconstructions and MIPs were obtained to evaluate the vascular anatomy. Carotid stenosis measurements (when applicable) are obtained utilizing NASCET criteria, using the distal internal carotid diameter as the denominator. CONTRAST:  39m OMNIPAQUE IOHEXOL 350 MG/ML SOLN COMPARISON:  Non-contrast head CT performed earlier the same day 05/10/2017, chest CT 05/10/2017. FINDINGS: CTA NECK FINDINGS Elevation is somewhat limited due to contrast bolus timing. Aortic arch: Standard aortic branching. Atherosclerotic calcification within the visualized aortic arch and proximal major branch  vessels of the neck. No hemodynamically significant innominate or proximal subclavian artery stenosis. Right carotid system: Streak artifact from a dense right-sided contrast bolus limits evaluation of the proximal right CCA. Within this limitation, the CCA and ICA are patent within the neck without significant stenosis (50% or greater). Mild calcified plaque within the carotid bifurcation and carotid bulb. Left carotid system: CCA and ICA patent within the neck without significant stenosis (50% or greater). Mild to moderate mixed plaque within the carotid bifurcation and carotid bulb. Vertebral arteries: Codominant and patent within the neck bilaterally without significant stenosis. Mild calcified plaque at the origin of the left vertebral artery. Skeleton: No acute bony abnormality or aggressive osseous lesion. Reversal of the expected cervical lordosis. Cervical spondylosis with multilevel disc space narrowing, posterior disc osteophyte complexes, uncovertebral and facet hypertrophy. Other neck: Heterogeneous enlarged thyroid gland extending into the superior mediastinum with multiple nodules and calcifications. A dominant right thyroid lobe nodule measures 3.5 cm (series 6, image 58). Upper chest: Emphysema. No consolidation within the imaged lung apices. Smooth interlobular septal thickening within the imaged lung apices which is nonspecific, but may reflect edema. Review of the MIP images confirms the above findings CTA HEAD FINDINGS Anterior circulation: Intracranial internal carotid arteries are patent bilaterally. Calcified plaque within these vessels bilaterally with no more than mild stenosis. The M1 middle cerebral arteries are patent without significant stenosis. There is abrupt occlusion of a proximal to mid left M2 MCA branch vessel (series 16, image 31) (series 17, image 71). The anterior cerebral arteries are patent bilaterally without significant proximal stenosis. No intracranial aneurysm is  identified. Posterior circulation: The intracranial vertebral arteries are patent without significant stenosis, as is the basilar artery. The posterior cerebral arteries are patent proximally without significant stenosis. Posterior communicating arteries are hypoplastic or absent bilaterally. Venous sinuses: Within limitations of contrast timing, no convincing thrombus. Hypoplastic left transverse and sigmoid dural venous sinuses. Anatomic variants: As described Review of the MIP images confirms the above findings These results were communicated to Dr. XErlinda HongAt 6:23 pmon 6/1/2021by text page via the [facility] system. IMPRESSION: CTA neck: 1. The examination is somewhat limited due to poor contrast bolus timing at the level of the neck. 2. With streak artifact from a dense right-sided contrast bolus limits evaluation of the proximal right CCA. Within this limitation, the bilateral common and internal carotid arteries are patent within the neck without significant stenosis. Atherosclerotic plaque within the carotid bifurcations and ICA bulbs bilaterally. 3. The vertebral arteries are patent within the neck bilaterally without significant stenosis. Mild  calcified plaque at the origin of the left vertebral artery. 4. 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 A dominant right thyroid nodule measures 3.5 cm. Nonemergent thyroid ultrasound is recommended for further evaluation. CTA head: 1. Abrupt occlusion of a proximal to mid M2 left MCA branch vessel. 2. No other intracranial large vessel occlusion or proximal high-grade arterial stenosis is identified. 3. Mild atherosclerotic disease within the intracranial ICAs bilaterally. Electronically Signed   By: Kellie Simmering DO   On: 08/10/2019 18:38   MR ANGIO HEAD WO CONTRAST  Result Date: 08/11/2019 CLINICAL DATA:  Stroke post thrombectomy, follow-up EXAM: MRI HEAD WITHOUT CONTRAST MRA HEAD WITHOUT CONTRAST TECHNIQUE: Multiplanar,  multiecho pulse sequences of the brain and surrounding structures were obtained without intravenous contrast. Angiographic images of the head were obtained using MRA technique without contrast. COMPARISON:  Correlation made with prior CT imaging FINDINGS: MRI HEAD Brain: Large evolving acute left MCA territory infarction involving frontoparietal greater than temporal lobes as well as the anterior insula. There is also some left MCA/ACA and MCA/PCA watershed involvement as well as left ACA involvement. Areas of susceptibility likely reflecting petechial reperfusion hemorrhage. There is no significant mass effect. 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 Small chronic right cerebellar infarcts. Additional patchy T2 hyperintensity in the supratentorial white matter is nonspecific but probably reflects mild chronic microvascular ischemic changes. There is no intracranial mass or hydrocephalus. Vascular: Major vessel flow voids at the skull base are preserved. Skull and upper cervical spine: Normal marrow signal is preserved. Sinuses/Orbits: Mild mucosal thickening. Orbits are unremarkable. Other: Sella is unremarkable. Mastoid air cells are clear. MRA HEAD Motion artifact is present. Intracranial internal carotid arteries are patent with atherosclerotic irregularity. Left M1 MCA is patent. There appears to be now more proximal left M2 MCA branch occlusion within limitation of motion. Proximal right middle and both prox anterior cerebral arteries are patent. Intracranial vertebral arteries, basilar artery, proximal posterior cerebral arteries are patent. 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 There is also some watershed involvement with the ACA and PCA as well some left ACA involvement. No significant mass effect. Chronic findings  detailed above. Motion degraded vascular imaging. Suspected more now proximal left M2 MCA branch occlusion. Electronically Signed   By: Macy Mis M.D.   On: 08/11/2019 17:22   MR BRAIN WO CONTRAST  Result Date: 08/11/2019 CLINICAL DATA:  Stroke post thrombectomy, follow-up EXAM: MRI HEAD WITHOUT CONTRAST MRA HEAD WITHOUT CONTRAST TECHNIQUE: Multiplanar, multiecho pulse sequences of the brain and surrounding structures were obtained without intravenous contrast. Angiographic images of the head were obtained using MRA technique without contrast. COMPARISON:  Correlation made with prior CT imaging FINDINGS: MRI HEAD Brain: Large evolving acute left MCA territory infarction involving frontoparietal greater than temporal lobes as well as the anterior insula. There is also some left MCA/ACA and MCA/PCA watershed involvement as well as left ACA involvement. Areas of susceptibility likely reflecting petechial reperfusion hemorrhage. There is no significant mass effect. 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 Small chronic right cerebellar infarcts. Additional patchy T2 hyperintensity in the supratentorial white matter is nonspecific but probably reflects mild chronic microvascular ischemic changes. There is no intracranial mass or hydrocephalus. Vascular: Major vessel flow voids at the skull base are preserved. Skull and upper cervical spine: Normal marrow  signal is preserved. Sinuses/Orbits: Mild mucosal thickening. Orbits are unremarkable. Other: Sella is unremarkable. Mastoid air cells are clear. MRA HEAD Motion artifact is present. Intracranial internal carotid arteries are patent with atherosclerotic irregularity. Left M1 MCA is patent. There appears to be now more proximal left M2 MCA branch occlusion within limitation of motion. Proximal right middle and both prox anterior cerebral arteries are patent. Intracranial  vertebral arteries, basilar artery, proximal posterior cerebral arteries are patent. 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 There is also some watershed involvement with the ACA and PCA as well some left ACA involvement. No significant mass effect. Chronic findings detailed above. Motion degraded vascular imaging. Suspected more now proximal left M2 MCA branch occlusion. Electronically Signed   By: Macy Mis M.D.   On: 08/11/2019 17:22   IR CT Head Ltd  Result Date: 08/11/2019 INDICATION: [age]-year-old male with past medical history significant for hypertension, diabetes mellitus, hyperlipidemia and heavy alcohol use with a modified Rankin scale of 0. He had a sudden onset of right-sided weakness at 16:30 on 08/10/2019. EMS was called and at arrival, it was noted BP 230s with aphasia, left gaze and right hemiplegia. En route, EKG showed probable Afib. Admission, NIHSS was 25. Head CT showed subtle decreased density in the cortex in the left frontal and parietal regions and no hemorrhage. IV tPA was administered at 17:49. CT angiogram showed a left M3/MCA superior division branch occlusion. Initially, it was planned for the patient to be transferred the to an outside hospital for intervention given that the angio suite was occupied by another emergency intervention. However, the first intervention was completed prior to patient's transfer. Therefore, we accepted the patient to our service for an emergency diagnostic cerebral angiogram and mechanical thrombectomy. EXAM: Diagnostic cerebral angiogram Mechanical thrombectomy Flat panel head CT COMPARISON:  CT/CT angiogram of the head and neck August 10, 2019. MEDICATIONS: No antibiotics given. ANESTHESIA/SEDATION: The procedure was performed in the general anesthesia provided by the Department of Anesthesiology. FLUOROSCOPY TIME:  Fluoroscopy Time: 40 minutes (1090 mGy).  COMPLICATIONS: None immediate. TECHNIQUE: Informed written consent was obtained from the patient's son after a thorough discussion of the procedural risks, benefits and alternatives. All questions were addressed. Maximal Sterile Barrier Technique was utilized including caps, mask, sterile gowns, sterile gloves, sterile drape, hand hygiene and skin antiseptic. A timeout was performed prior to the initiation of the procedure. The right groin was prepped and draped in the usual sterile fashion. Using a micropuncture kit and the modified Seldinger technique, access was gained to the right common femoral artery and an 8 French sheath was placed. Under fluoroscopy a Zoom 88 catheter was advanced over a 6 [place] Berenstein catheter and a Terumo 0.035 inch glidewire into the aortic arch. The catheter was then placed into the left common carotid artery. Frontal and lateral angiograms of the neck were obtained. The catheter was then advanced into the mid cervical segment of the left ICA. Frontal and lateral angiograms of the head were obtained. FINDINGS: 1. Minimal irregularity of the left carotid bulb, suggesting early atherosclerotic disease without stenosis. 2. Increased tortuosity of the cervical segment of the left ICA, may be related to longstanding hypertension. 3. There is no occlusion of the proximal M3 segment of the left MCA superior division branch. 4. Slow flow is noted in some A5/ACA and multiple M4/MCA posterior division branches. PROCEDURE: Under biplane roadmap, a Zoom 35 aspiration catheter was navigated over  a synchro support microguidewire into the left M3/MCA superior division branch. The wire was removed and the catheter was connected to an aspiration pump. Continuous aspiration performed for 4 minutes. The catheter was then removed under continuous aspiration. A piece of clot was noted in the aspiration pump. Follow-up angiogram showed persistent occlusion of the left M3/MCA superior division branch. In  a similar fashion, a second aspiration pass was performed. Follow-up angiogram showed persistent occlusion of the left M3/MCA superior division branch. Under biplane roadmap, a phenom 21 microcatheter was navigated over an Aristotle 14 microwire into the left M3/MCA superior division branch. Then, a 3 mm solitaire stent retriever was deployed spanning the M3 segment. The device was allowed to intercalated with the clot for 4 minutes. The microcatheter was connected to a penumbra aspiration pump. The thrombectomy device was removed under constant aspiration. A piece of clot was noted in the stent retriever. Follow-up angiogram showed small recanalize length of the M3 segment with persistent occlusion of the distal left M3/MCA superior division branch. Flat panel CT of the head was obtained and post processed in a separate workstation with concurrent attending physician supervision. Selected images were sent to PACS. Hyperdensity noted within the anterior left sylvian fissure likely contrast retained in the occluded vessel with minimal surrounding subarachnoid hemorrhage. Delayed left ICA angiograms with frontal and lateral views of the head were obtained showing persistent occlusion of left M3/MCA anterior division branch as well as persistent A5/ACA and M4/MCA slow flow. The catheter was subsequently withdrawn. A right common femoral artery angiogram was obtained with frontal and lateral views. The puncture is at the common femoral artery. The sheath was exchanged over the wire for an 8 [place] Angio-Seal which was utilized for access closure. Immediate hemostasis was achieved. IMPRESSION: 1. Mechanical thrombectomy performed for treatment of a left M3/MCA superior division branch with to direct aspiration passes and 1 stent retriever pass. Only minimal recanalization was achieved (TICI 2A) despite retrieval of clot in 2 passes. 2. Slow flow in A5/ACA and M4/MCA branches, too distal for safe endovascular access. 3.  Postprocedural flat panel CT showing negligible amount of subarachnoid hemorrhage in the left sylvian fissure PLAN: Patient remained intubated and transferred to ICU for further care. Electronically Signed   By: Pedro Earls M.D.   On: 08/11/2019 15:25   ECHOCARDIOGRAM COMPLETE  Result Date: 08/11/2019    ECHOCARDIOGRAM REPORT   Patient Name:   Malik [name] Date of Exam: 08/11/2019 Medical Rec #:  [id number]         Height:       71.0 in Accession #:    [id number]        Weight:       220.7 lb Date of Birth:  12-Aug-1945         BSA:          2.199 m Patient Age:    10 years          BP:           166/88 mmHg Patient Gender: M                 HR:           91 bpm. Exam Location:  Inpatient Procedure: 2D Echo, Color Doppler and Cardiac Doppler Indications:    Stroke i163.9  History:        Patient has no prior history of Echocardiogram examinations.  CHF; Risk Factors:Hypertension, Dyslipidemia and Sleep Apnea.  Sonographer:    Raquel Sarna Senior RDCS Referring Phys: [id number] [place]  1. Left ventricular ejection fraction, by estimation, is 50 to 55%. The left ventricle has low normal function. The left ventricle has no regional wall motion abnormalities. There is severe left ventricular hypertrophy. Left ventricular diastolic parameters are consistent with Grade II diastolic dysfunction (pseudonormalization).  2. Right ventricular systolic function is mildly reduced. The right ventricular size is mildly enlarged. There is severely elevated pulmonary artery systolic pressure. The estimated right ventricular systolic pressure is 59.5 mmHg.  3. Left atrial size was mildly dilated.  4. Right atrial size was mildly dilated.  5. The mitral valve is normal in structure. Mild mitral valve regurgitation. No evidence of mitral stenosis.  6. The aortic valve is tricuspid. Aortic valve regurgitation is not visualized. Mild aortic valve sclerosis is present, with no evidence of aortic  valve stenosis.  7. The inferior vena cava is dilated in size with <50% respiratory variability, suggesting right atrial pressure of 15 mmHg. FINDINGS  Left Ventricle: Left ventricular ejection fraction, by estimation, is 50 to 55%. The left ventricle has low normal function. The left ventricle has no regional wall motion abnormalities. The left ventricular internal cavity size was normal in size. There is severe left ventricular hypertrophy. Left ventricular diastolic parameters are consistent with Grade II diastolic dysfunction (pseudonormalization). Right Ventricle: The right ventricular size is mildly enlarged. No increase in right ventricular wall thickness. Right ventricular systolic function is mildly reduced. There is severely elevated pulmonary artery systolic pressure. The tricuspid regurgitant velocity is 3.43 m/s, and with an assumed right atrial pressure of 15 mmHg, the estimated right ventricular systolic pressure is 63.8 mmHg. Left Atrium: Left atrial size was mildly dilated. Right Atrium: Right atrial size was mildly dilated. Pericardium: Trivial pericardial effusion is present. Mitral Valve: The mitral valve is normal in structure. Mild mitral valve regurgitation. No evidence of mitral valve stenosis. Tricuspid Valve: The tricuspid valve is normal in structure. Tricuspid valve regurgitation is trivial. Aortic Valve: The aortic valve is tricuspid. Aortic valve regurgitation is not visualized. Mild aortic valve sclerosis is present, with no evidence of aortic valve stenosis. Pulmonic Valve: The pulmonic valve was normal in structure. Pulmonic valve regurgitation is trivial. Aorta: The aortic root is normal in size and structure. Venous: The inferior vena cava is dilated in size with less than 50% respiratory variability, suggesting right atrial pressure of 15 mmHg. IAS/Shunts: No atrial level shunt detected by color flow Doppler.  LEFT VENTRICLE PLAX 2D LVIDd:         5.04 cm  Diastology LVIDs:          4.06 cm  LV e' lateral:   5.66 cm/s LV PW:         1.61 cm  LV E/e' lateral: 17.1 LV IVS:        1.42 cm  LV e' medial:    5.98 cm/s LVOT diam:     2.00 cm  LV E/e' medial:  16.2 LV SV:         59 LV SV Index:   27 LVOT Area:     3.14 cm  RIGHT VENTRICLE RV S prime:     10.00 cm/s TAPSE (M-mode): 2.2 cm LEFT ATRIUM             Index       RIGHT ATRIUM           Index LA diam:  5.20 cm 2.37 cm/m  RA Area:     25.90 cm LA Vol (A2C):   82.7 ml 37.61 ml/m RA Volume:   84.00 ml  38.21 ml/m LA Vol (A4C):   57.1 ml 25.97 ml/m LA Biplane Vol: 76.2 ml 34.66 ml/m  AORTIC VALVE LVOT Vmax:   86.50 cm/s LVOT Vmean:  56.100 cm/s LVOT VTI:    0.188 m  AORTA Ao Root diam: 3.30 cm Ao Asc diam:  3.60 cm MITRAL VALVE               TRICUSPID VALVE MV Area (PHT): 4.21 cm    TR Peak grad:   47.1 mmHg MV Decel Time: 180 msec    TR Vmax:        343.00 cm/s MV E velocity: 97.00 cm/s MV A velocity: 89.20 cm/s  SHUNTS MV E/A ratio:  1.09        Systemic VTI:  0.19 m                            Systemic Diam: 2.00 cm Loralie Champagne MD Electronically signed by Loralie Champagne MD Signature Date/Time: 08/11/2019/5:29:10 PM    Final    IR PERCUTANEOUS ART THROMBECTOMY/INFUSION INTRACRANIAL INC DIAG ANGIO  Result Date: 08/11/2019 INDICATION: [age]-year-old male with past medical history significant for hypertension, diabetes mellitus, hyperlipidemia and heavy alcohol use with a modified Rankin scale of 0. He had a sudden onset of right-sided weakness at 16:30 on 08/10/2019. EMS was called and at arrival, it was noted BP 230s with aphasia, left gaze and right hemiplegia. En route, EKG showed probable Afib. Admission, NIHSS was 25. Head CT showed subtle decreased density in the cortex in the left frontal and parietal regions and no hemorrhage. IV tPA was administered at 17:49. CT angiogram showed a left M3/MCA superior division branch occlusion. Initially, it was planned for the patient to be transferred the to an outside hospital for  intervention given that the angio suite was occupied by another emergency intervention. However, the first intervention was completed prior to patient's transfer. Therefore, we accepted the patient to our service for an emergency diagnostic cerebral angiogram and mechanical thrombectomy. EXAM: Diagnostic cerebral angiogram Mechanical thrombectomy Flat panel head CT COMPARISON:  CT/CT angiogram of the head and neck August 10, 2019. MEDICATIONS: No antibiotics given. ANESTHESIA/SEDATION: The procedure was performed in the general anesthesia provided by the Department of Anesthesiology. FLUOROSCOPY TIME:  Fluoroscopy Time: 40 minutes (1090 mGy). COMPLICATIONS: None immediate. TECHNIQUE: Informed written consent was obtained from the patient's son after a thorough discussion of the procedural risks, benefits and alternatives. All questions were addressed. Maximal Sterile Barrier Technique was utilized including caps, mask, sterile gowns, sterile gloves, sterile drape, hand hygiene and skin antiseptic. A timeout was performed prior to the initiation of the procedure. The right groin was prepped and draped in the usual sterile fashion. Using a micropuncture kit and the modified Seldinger technique, access was gained to the right common femoral artery and an 8 French sheath was placed. Under fluoroscopy a Zoom 88 catheter was advanced over a 6 [place] Berenstein catheter and a Terumo 0.035 inch glidewire into the aortic arch. The catheter was then placed into the left common carotid artery. Frontal and lateral angiograms of the neck were obtained. The catheter was then advanced into the mid cervical segment of the left ICA. Frontal and lateral angiograms of the head were obtained. FINDINGS: 1. Minimal irregularity of the  left carotid bulb, suggesting early atherosclerotic disease without stenosis. 2. Increased tortuosity of the cervical segment of the left ICA, may be related to longstanding hypertension. 3. There is no  occlusion of the proximal M3 segment of the left MCA superior division branch. 4. Slow flow is noted in some A5/ACA and multiple M4/MCA posterior division branches. PROCEDURE: Under biplane roadmap, a Zoom 35 aspiration catheter was navigated over a synchro support microguidewire into the left M3/MCA superior division branch. The wire was removed and the catheter was connected to an aspiration pump. Continuous aspiration performed for 4 minutes. The catheter was then removed under continuous aspiration. A piece of clot was noted in the aspiration pump. Follow-up angiogram showed persistent occlusion of the left M3/MCA superior division branch. In a similar fashion, a second aspiration pass was performed. Follow-up angiogram showed persistent occlusion of the left M3/MCA superior division branch. Under biplane roadmap, a phenom 21 microcatheter was navigated over an Aristotle 14 microwire into the left M3/MCA superior division branch. Then, a 3 mm solitaire stent retriever was deployed spanning the M3 segment. The device was allowed to intercalated with the clot for 4 minutes. The microcatheter was connected to a penumbra aspiration pump. The thrombectomy device was removed under constant aspiration. A piece of clot was noted in the stent retriever. Follow-up angiogram showed small recanalize length of the M3 segment with persistent occlusion of the distal left M3/MCA superior division branch. Flat panel CT of the head was obtained and post processed in a separate workstation with concurrent attending physician supervision. Selected images were sent to PACS. Hyperdensity noted within the anterior left sylvian fissure likely contrast retained in the occluded vessel with minimal surrounding subarachnoid hemorrhage. Delayed left ICA angiograms with frontal and lateral views of the head were obtained showing persistent occlusion of left M3/MCA anterior division branch as well as persistent A5/ACA and M4/MCA slow flow.  The catheter was subsequently withdrawn. A right common femoral artery angiogram was obtained with frontal and lateral views. The puncture is at the common femoral artery. The sheath was exchanged over the wire for an 8 [place] Angio-Seal which was utilized for access closure. Immediate hemostasis was achieved. IMPRESSION: 1. Mechanical thrombectomy performed for treatment of a left M3/MCA superior division branch with to direct aspiration passes and 1 stent retriever pass. Only minimal recanalization was achieved (TICI 2A) despite retrieval of clot in 2 passes. 2. Slow flow in A5/ACA and M4/MCA branches, too distal for safe endovascular access. 3. Postprocedural flat panel CT showing negligible amount of subarachnoid hemorrhage in the left sylvian fissure PLAN: Patient remained intubated and transferred to ICU for further care. Electronically Signed   By: Pedro Earls M.D.   On: 08/11/2019 15:25   CT HEAD CODE STROKE WO CONTRAST  Result Date: 08/10/2019 CLINICAL DATA:  Code stroke. Ataxia, stroke suspected. Additional history provided: Sudden collapse, left gaze, right hemiplegia. EXAM: CT HEAD WITHOUT CONTRAST TECHNIQUE: Contiguous axial images were obtained from the base of the skull through the vertex without intravenous contrast. COMPARISON:  Noncontrast head CT 05/10/2017 FINDINGS: Brain: Mild ill-defined hypoattenuation within the cerebral white matter is nonspecific, but consistent with chronic small vessel ischemic disease. Mild generalized parenchymal atrophy. There is no acute intracranial hemorrhage. No demarcated cortical infarct. No extra-axial fluid collection. No evidence of intracranial mass. No midline shift. Vascular: No hyperdense vessel.  Atherosclerotic calcifications. Skull: Normal. Negative for fracture or focal lesion. Sinuses/Orbits: Visualized orbits show no acute finding. Mild paranasal sinus mucosal thickening. No  significant mastoid effusion. ASPECTS [facility] Stroke  Program Early CT Score) - Ganglionic level infarction (caudate, lentiform nuclei, internal capsule, insula, M1-M3 cortex): 7 - Supraganglionic infarction (M4-M6 cortex): 3 Total score (0-10 with 10 being normal): 10 These results were communicated to Dr. Erlinda Hong At 5:57 pmon 6/1/2021by text page via the [facility] messaging system. IMPRESSION: No CT evidence of acute intracranial abnormality. Mild generalized parenchymal atrophy and chronic small vessel ischemic disease. Mild paranasal sinus mucosal thickening. Electronically Signed   By: Kellie Simmering DO   On: 08/10/2019 17:57    Procedures Procedures (including critical care time)  Medications Ordered in ED Medications  labetalol (NORMODYNE) 5 MG/ML injection (has no administration in time range)  clevidipine (CLEVIPREX) infusion 0.5 mg/mL (6 mg/hr Intravenous Rate/Dose Verify 08/12/19 0700)  clevidipine (CLEVIPREX) 0.5 MG/ML infusion (has no administration in time range)   stroke: mapping our early stages of recovery book (has no administration in time range)  0.9 %  sodium chloride infusion ( Intravenous Rate/Dose Verify 08/12/19 0700)  acetaminophen (TYLENOL) tablet 650 mg (has no administration in time range)    Or  acetaminophen (TYLENOL) 160 MG/5ML solution 650 mg (has no administration in time range)    Or  acetaminophen (TYLENOL) suppository 650 mg (has no administration in time range)  senna-docusate (Senokot-S) tablet 1 tablet (has no administration in time range)  pantoprazole (PROTONIX) injection 40 mg (40 mg Intravenous Given 08/11/19 2133)  iohexol (OMNIPAQUE) 240 MG/ML injection (has no administration in time range)  Chlorhexidine Gluconate Cloth 2 % PADS 6 each (6 each Topical Given 08/11/19 1741)  MEDLINE mouth rinse (0 mLs Mouth Rinse Duplicate 06/15/63 9935)  chlorhexidine (PERIDEX) 0.12 % solution 15 mL (15 mLs Mouth Rinse Given 08/11/19 2128)  insulin aspart (novoLOG) injection 0-15 Units (0 Units Subcutaneous Not Given 08/12/19 0335)    LORazepam (ATIVAN) tablet 1-4 mg ( Oral See Alternative 08/11/19 2133)    Or  LORazepam (ATIVAN) injection 1-4 mg (2 mg Intravenous Given 7/0/17 7939)  folic acid injection 1 mg (1 mg Intravenous Given 08/11/19 1738)  thiamine (B-1) injection 100 mg (100 mg Intravenous Given 08/11/19 1752)  fentaNYL citrate (PF) (SUBLIMAZE) 250 MCG/5ML injection (  Override pull for Anesthesia 08/10/19 2032)  alteplase (ACTIVASE) 1 mg/mL infusion 90 mg (0 mg Intravenous Stopped 08/10/19 1849)    Followed by  0.9 %  sodium chloride infusion (50 mLs Intravenous New Bag/Given 08/10/19 1849)  iohexol (OMNIPAQUE) 350 MG/ML injection 75 mL (75 mLs Intravenous Contrast Given 08/10/19 1808)  labetalol (NORMODYNE) injection 20 mg (20 mg Intravenous Given 08/10/19 1735)  labetalol (NORMODYNE) injection 20 mg (20 mg Intravenous Given 08/10/19 1803)  iohexol (OMNIPAQUE) 240 MG/ML injection 150 mL (50 mLs Intra-arterial Contrast Given 08/10/19 2125)    ED Course  I have reviewed the triage vital signs and the nursing notes.  Pertinent labs & imaging results that were available during my care of the patient were reviewed by me and considered in my medical decision making (see chart for details).    MDM Rules/Calculators/A&P                      Iv ns. Continuous pulse ox and monitor. Stat labs. Stat ct scan.  Patient arrived as code stroke activation, neurology/stroke team met in ED, and facilitated emergent imaging.  Reviewed nursing notes and prior charts for additional history.   MDM Number of Diagnoses or Management Options   Amount and/or Complexity of Data Reviewed Clinical lab tests: ordered  and reviewed Tests in the radiology section of CPT: ordered and reviewed Tests in the medicine section of CPT: reviewed and ordered Discussion of test results with the performing providers: yes Decide to obtain previous medical records or to obtain history from someone other than the patient: yes Obtain history from someone other  than the patient: yes Review and summarize past medical records: yes Discuss the patient with other providers: yes Independent visualization of images, tracings, or specimens: yes  Risk of Complications, Morbidity, and/or Mortality Presenting problems: high Diagnostic procedures: high Management options: high   Initial labs reviewed/interpreted by me - k sl low, 3.3  CT/MRI reviewed/interpreted by me - left mca stroke.   Neurology did order TPA, and emergent IR.  CRITICAL CARE RE: acute CVA with emergent TPA and IR therapy Performed by: Mirna Mires Total critical care time: 35 minutes Critical care time was exclusive of separately billable procedures and treating other patients. Critical care was necessary to treat or prevent imminent or life-threatening deterioration. Critical care was time spent personally by me on the following activities: development of treatment plan with patient and/or surrogate as well as nursing, discussions with consultants, evaluation of patient's response to treatment, examination of patient, obtaining history from patient or surrogate, ordering and performing treatments and interventions, ordering and review of laboratory studies, ordering and review of radiographic studies, pulse oximetry and re-evaluation of patient's condition.  Final Clinical Impression(s) / ED Diagnoses Final diagnoses:  Stroke (cerebrum) [facility])    Rx / DC Orders ED Discharge Orders    None       Lajean Saver, MD 08/12/19 [phone]

## 2019-08-12 NOTE — Progress Notes (Signed)
Occupational Therapy Evaluation   Pt admitted with the below listed diagosis, and demonstrates the below listed deficits.  He presents with Rt hemiplegia, Lt gaze preference with Rt neglect, pusher syndrome, impaired balance, impaired communication, visual deficits, and impaired cognition.  He follows occasional Lt sided motor commands with a delay and multi modal cuing.  He will look toward therapist when on his Lt, but otherwise does not engage with his environment.  He requires total A for all aspects of ADLs and requires total A +2 for bed mobility, and max A +2 for sitting EOB.   Pt unable to provide info re: PLOF and no family available.  Anticipate he will require SNF level rehab at discharge.     08/12/19 1400  OT Visit Information  Last OT Received On 08/12/19  Assistance Needed +2  PT/OT/SLP Co-Evaluation/Treatment Yes  Reason for Co-Treatment Complexity of the patient's impairments (multi-system involvement);Necessary to address cognition/behavior during functional activity;For patient/therapist safety;To address functional/ADL transfers  OT goals addressed during session ADL's and self-care;Strengthening/ROM  History of Present Illness This 74 y.o. male admitted with sudden onset of aphasia, Lt gaze preference, and Rt hemiplegia.   He was found to have Lt MCA infarct with proximal to mid M2/M3 occlusion.  He is s/p tPA and thrombectomy.  Repeast CT showed significant progression of Lt MCA infarct with hypodensity in teh Lt middle frontal gyrus extending into the Lt parietal and Lt occipital lobe.  Linear hyperdensity in the Lt sylvian fissure likely represents small SAH.  Repeat MRI showed large Lt evolving MCA infarct with evidence of hemorrhagic conversion as well as some watershed involvement in the ACA, PCA, and LT ACA.   PMH includes:  TBI, neuropathy, LVH, HTN, Dilated cardiomyopathy, DM, chronic diastolic CHF.  ETOH abuse.   Precautions  Precautions Fall  Precaution Comments Lt  pusher   Home Living  Family/patient expects to be discharged to: Private residence  Additional Comments Pt unable to provide info and no family present   Prior Function  Comments Pt unable to provide info, and no family available.  Per chart review, pt discharged from [facility] in 2019 at supervision level with sister providing assist/supervision   Communication  Communication Expressive difficulties;Receptive difficulties  Pain Assessment  Pain Assessment Faces  Faces Pain Scale 0  Cognition  Arousal/Alertness Lethargic  Behavior During Therapy Flat affect  Overall Cognitive Status Impaired/Different from baseline  Area of Impairment Attention;Following commands;Problem solving  Current Attention Level Focused  Following Commands Follows one step commands inconsistently;Follows one step commands with increased time  Problem Solving Slow processing;Decreased initiation  General Comments Pt does not respond to cues, noise on the Rt side, however, he will open eyes, and turn head to the Lt when cues, commands provided on the Lt.  He is lethargic, and will follow one step commands intermittently with Lt LE with a delay and with tactile and auditory cues   Upper Extremity Assessment  Upper Extremity Assessment RUE deficits/detail  RUE Deficits / Details Rt UE with no active movvement.  Mild tighntess noted throughout Lt UE.     RUE Coordination decreased fine motor;decreased gross motor  Lower Extremity Assessment  Lower Extremity Assessment Defer to PT evaluation  Cervical / Trunk Assessment  Cervical / Trunk Assessment Other exceptions  Cervical / Trunk Exceptions Pt with decreased isolated movement of trunk.  Pt pushes heavily to the Rt   ADL  Overall ADL's  Needs assistance/impaired  Eating/Feeding NPO  Grooming Wash/dry hands;Wash/dry face;Oral care;Brushing  hair;Total assistance;Sitting  Grooming Details (indicate cue type and reason) does not engage with objects.  Requires hand over  hand assist for simple grooming activities   Upper Body Bathing Total assistance;Sitting;Bed level  Lower Body Bathing Total assistance;Bed level  Upper Body Dressing  Total assistance;Bed level  Lower Body Dressing Total assistance;Bed level  Toilet Transfer Total assistance  Toilet Transfer Details (indicate cue type and reason) unable to attempt   Toileting- Clothing Manipulation and Hygiene Total assistance;Bed level  Functional mobility during ADLs Total assistance  General ADL Comments Pt does not attempt to engage in ADL tasks   Vision- Assessment  Additional Comments Pt with Lt gaze preference.  He will look to midline, but not past midline   Perception  Perception Tested? Yes  Perception Deficits Inattention/neglect  Inattention/Neglect Does not attend to right visual field;Does not attend to right side of body  Spatial deficits Pt demonstrates Rt neglect   Praxis  Praxis-Other Comments unable to accuratetly assess   Bed Mobility  Overal bed mobility Needs Assistance  Bed Mobility Supine to Sit;Sit to Supine  Supine to sit Total assist;+2 for physical assistance;+2 for safety/equipment  Sit to supine Total assist;+2 for physical assistance;+2 for safety/equipment  General bed mobility comments requires asssist for all aspects. He did spontaneously attempt to lift LE onto bed and minimal spontaneous movement of Rt LE noted as he attempted to lift it onto bed   Transfers  General transfer comment unable to safely attempt   Balance  Overall balance assessment Needs assistance  Sitting-balance support Feet supported;Single extremity supported;No upper extremity supported  Sitting balance-Leahy Scale Poor  Sitting balance - Comments Pt pushes heavily to the Rt with the Lt UE.  He requires max A +2 (for safety) to maintain EOB sitting.    Postural control Right lateral lean  General Comments  General comments (skin integrity, edema, etc.) on 5L O2 sitting EOB noted one episode of  spillage of secretions, BP 179/98 sitting EOB and HR 106, SpO2 100% RN in room upon return to supine BP 156/86  Exercises  Exercises Other exercises  Other Exercises  Other Exercises while EOB sitting, worked on [employer] on [employer] as as rotation to [employer] with max facilitation to reduce pushing and improve sitting balance   OT - End of Session  Equipment Utilized During Treatment Oxygen  Activity Tolerance Patient limited by fatigue  Patient left in bed;with call bell/phone within reach;with bed alarm set;with nursing/sitter in room  Nurse Communication Mobility status  OT Assessment  OT Recommendation/Assessment Patient needs continued OT Services  OT Visit Diagnosis Hemiplegia and hemiparesis  Hemiplegia - Right/Left Right  Hemiplegia - dominant/non-dominant Dominant  Hemiplegia - caused by Cerebral infarction  OT Problem List Decreased strength;Decreased range of motion;Decreased activity tolerance;Impaired balance (sitting and/or standing);Impaired vision/perception;Decreased coordination;Decreased cognition;Decreased safety awareness;Decreased knowledge of use of DME or AE;Cardiopulmonary status limiting activity;Impaired sensation;Impaired tone;Impaired UE functional use  Barriers to Discharge Decreased caregiver support  Barriers to Discharge Comments unsure if family able to provide appropriate level of assist at discharge   OT Plan  OT Frequency (ACUTE ONLY) Min 2X/week  OT Treatment/Interventions (ACUTE ONLY) Self-care/ADL training;Neuromuscular education;DME and/or AE instruction;Manual therapy;Splinting;Therapeutic activities;Cognitive remediation/compensation;Visual/perceptual remediation/compensation;Patient/family education;Balance training  AM-PAC OT "6 Clicks" Daily Activity Outcome Measure (Version 2)  Help from another person eating meals? 1  Help from another person taking care of personal grooming? 1  Help from another person toileting, which includes using toliet,  bedpan, or urinal? 1  Help from another person bathing (including washing, rinsing, drying)? 1  Help from another person to put on and taking off regular upper body clothing? 1  Help from another person to put on and taking off regular lower body clothing? 1  6 Click Score 6  OT Recommendation  Follow Up Recommendations SNF  OT Equipment None recommended by OT  Individuals Consulted  Consulted and Agree with Results and Recommendations Patient unable/family or caregiver not available  Acute Rehab OT Goals  OT Goal Formulation Patient unable to participate in goal setting  Time For Goal Achievement 08/26/19  Potential to Achieve Goals Good  OT Time Calculation  OT Start Time (ACUTE ONLY) 0945  OT Stop Time (ACUTE ONLY) 1011  OT Time Calculation (min) 26 min  OT General Charges  $OT Visit 1 Visit  OT Evaluation  $OT Eval Moderate Complexity 1 Mod  Written Expression  Dominant Hand Right  Nilsa Nutting., OTR/L Acute Rehabilitation Services Pager [phone] Office [phone]

## 2019-08-12 NOTE — Progress Notes (Signed)
Bilateral lower extremity venous duplex has been completed. Preliminary results can be found in CV Proc through chart review.   08/12/19 12:35 PM Malik [name] RVT

## 2019-08-12 NOTE — Evaluation (Signed)
Physical Therapy Evaluation Patient Details Name: Malik [name] MRN: [id number] DOB: 1945-08-07 Today's Date: 08/12/2019   History of Present Illness  This 74 y.o. male admitted with sudden onset of aphasia, Lt gaze preference, and Rt hemiplegia.   He was found to have Lt MCA infarct with proximal to mid M2/M3 occlusion.  He is s/p tPA and thrombectomy.  Repeast CT showed significant progression of Lt MCA infarct with hypodensity in teh Lt middle frontal gyrus extending into the Lt parietal and Lt occipital lobe.  Linear hyperdensity in the Lt sylvian fissure likely represents small SAH.  Repeat MRI showed large Lt evolving MCA infarct with evidence of hemorrhagic conversion as well as some watershed involvement in the ACA, PCA, and LT ACA.   PMH includes:  TBI, neuropathy, LVH, HTN, Dilated cardiomyopathy, DM, chronic diastolic CHF.  ETOH abuse.   Clinical Impression  Patient presents with decreased arousal, decreased awareness, decreased communication, decreased activity tolerance, decreased balance, decreased R side awareness/strength all limiting independence and safety with mobility.  Today patient assisted with +2 total A to EOB and needing +2 max A for balance up at EOB due to pushing back and to R with L UE.  Assuming he was independent prior, but no family present to ask.  Feel he will need SNF level rehab upon d/c.  PT to follow acutely.     Follow Up Recommendations SNF    Equipment Recommendations  None recommended by PT    Recommendations for Other Services       Precautions / Restrictions Precautions Precautions: Fall Precaution Comments: Lt pusher       Mobility  Bed Mobility Overal bed mobility: Needs Assistance Bed Mobility: Supine to Sit;Sit to Supine     Supine to sit: Total assist;+2 for physical assistance;+2 for safety/equipment Sit to supine: Total assist;+2 for physical assistance;+2 for safety/equipment   General bed mobility comments: requires asssist  for all aspects. He did spontaneously attempt to lift LE onto bed and minimal spontaneous movement of Rt LE noted as he attempted to lift it onto bed   Transfers                 General transfer comment: unable to safely attempt   Ambulation/Gait                Stairs            Wheelchair Mobility    Modified Rankin (Stroke Patients Only) Modified Rankin (Stroke Patients Only) Pre-Morbid Rankin Score: No significant disability(not sure of baseline) Modified Rankin: Severe disability     Balance Overall balance assessment: Needs assistance Sitting-balance support: Feet supported;No upper extremity supported;Single extremity supported Sitting balance-Leahy Scale: Zero Sitting balance - Comments: puhes back and to R wiht L UE max A +2 for sitting EOB Postural control: Right lateral lean                                   Pertinent Vitals/Pain Pain Assessment: Faces Faces Pain Scale: No hurt    Home Living Family/patient expects to be discharged to:: Private residence                 Additional Comments: Pt unable to provide info and no family present     Prior Function           Comments: Pt unable to provide info, and no family available.  Per chart review,  pt discharged from [place] in 2019 at supervision level with sister providing assist/supervision      Hand Dominance   Dominant Hand: Right    Extremity/Trunk Assessment   Upper Extremity Assessment Upper Extremity Assessment: Defer to OT evaluation RUE Deficits / Details: Rt UE with no active movvement.  Mild tighntess noted throughout Lt UE.    RUE Coordination: decreased fine motor;decreased gross motor    Lower Extremity Assessment Lower Extremity Assessment: RLE deficits/detail;LLE deficits/detail RLE Deficits / Details: PROM WFL, increased tone with hip/knee flexion and with leg straight some extensor tone noted; patient did iniitate with R LE to lift into bed once  laying on left side with legs off bed RLE Coordination: decreased gross motor LLE Deficits / Details: PROM WFL, no active movement to command, some movement seen to lift leg into bed after sitting at EOB    Cervical / Trunk Assessment Cervical / Trunk Assessment: Other exceptions Cervical / Trunk Exceptions: Pt with decreased isolated movement of trunk.  Pt pushes heavily to the Rt   Communication   Communication: Expressive difficulties;Receptive difficulties  Cognition Arousal/Alertness: Lethargic Behavior During Therapy: Flat affect Overall Cognitive Status: Impaired/Different from baseline Area of Impairment: Attention;Following commands;Problem solving                   Current Attention Level: Focused   Following Commands: Follows one step commands inconsistently;Follows one step commands with increased time     Problem Solving: Slow processing;Decreased initiation General Comments: Pt does not respond to cues, noise on the Rt side, however, he will open eyes, and turn head to the Lt when cues, commands provided on the Lt.  He is lethargic, and will follow one step commands intermittently with Lt LE with a delay and with tactile and auditory cues       General Comments General comments (skin integrity, edema, etc.): on 5L O2 sitting EOB noted one episode of spillage of secretions, BP 179/98 sitting EOB and HR 106, SpO2 100% RN in room upon return to supine BP 156/86    Exercises Other Exercises Other Exercises: while EOB sitting, worked on [employer] on [employer] as as rotation to [employer] with max facilitation to reduce pushing and improve sitting balance    Assessment/Plan    PT Assessment Patient needs continued PT services  PT Problem List Decreased strength;Decreased mobility;Decreased balance;Decreased knowledge of use of DME;Decreased activity tolerance;Decreased cognition       PT Treatment Interventions Therapeutic activities;Therapeutic exercise;Patient/family  education;Balance training;Functional mobility training;Neuromuscular re-education    PT Goals (Current goals can be found in the Care Plan section)  Acute Rehab PT Goals Patient Stated Goal: patient unable to state, no family present PT Goal Formulation: Patient unable to participate in goal setting Time For Goal Achievement: 08/26/19 Potential to Achieve Goals: Fair    Frequency Min 3X/week   Barriers to discharge        Co-evaluation PT/OT/SLP Co-Evaluation/Treatment: Yes Reason for Co-Treatment: For patient/therapist safety;Necessary to address cognition/behavior during functional activity;Complexity of the patient's impairments (multi-system involvement) PT goals addressed during session: Mobility/safety with mobility;Strengthening/ROM OT goals addressed during session: ADL's and self-care;Strengthening/ROM       AM-PAC PT "6 Clicks" Mobility  Outcome Measure Help needed turning from your back to your side while in a flat bed without using bedrails?: Total Help needed moving from lying on your back to sitting on the side of a flat bed without using bedrails?: Total Help needed moving to and from a  bed to a chair (including a wheelchair)?: Total Help needed standing up from a chair using your arms (e.g., wheelchair or bedside chair)?: Total Help needed to walk in hospital room?: Total Help needed climbing 3-5 steps with a railing? : Total 6 Click Score: 6    End of Session Equipment Utilized During Treatment: Oxygen Activity Tolerance: Patient limited by lethargy Patient left: in bed;with call bell/phone within reach;with bed alarm set Nurse Communication: Mobility status PT Visit Diagnosis: Other abnormalities of gait and mobility (R26.89);Hemiplegia and hemiparesis;Muscle weakness (generalized) (M62.81) Hemiplegia - Right/Left: Right Hemiplegia - dominant/non-dominant: Dominant Hemiplegia - caused by: Cerebral infarction    Time: [id number] PT Time Calculation (min)  (ACUTE ONLY): 26 min   Charges:   PT Evaluation $PT Eval High Complexity: 1 High          Magda Kiel, PT Acute Rehabilitation Services [phone] 08/12/2019   Reginia Naas 08/12/2019, 3:52 PM

## 2019-08-13 ENCOUNTER — Inpatient Hospital Stay (HOSPITAL_COMMUNITY): Payer: Medicare Other

## 2019-08-13 DIAGNOSIS — R509 Fever, unspecified: Secondary | ICD-10-CM

## 2019-08-13 DIAGNOSIS — D72829 Elevated white blood cell count, unspecified: Secondary | ICD-10-CM

## 2019-08-13 DIAGNOSIS — L8990 Pressure ulcer of unspecified site, unspecified stage: Secondary | ICD-10-CM | POA: Diagnosis not present

## 2019-08-13 LAB — BASIC METABOLIC PANEL
Anion gap: 11 (ref 5–15)
BUN: 7 mg/dL — ABNORMAL LOW (ref 8–23)
CO2: 23 mmol/L (ref 22–32)
Calcium: 8.3 mg/dL — ABNORMAL LOW (ref 8.9–10.3)
Chloride: 101 mmol/L (ref 98–111)
Creatinine, Ser: 1.07 mg/dL (ref 0.61–1.24)
GFR calc Af Amer: >60 mL/min (ref >60)
GFR calc non Af Amer: >60 mL/min (ref >60)
Glucose, Bld: 133 mg/dL — ABNORMAL HIGH (ref 70–99)
Potassium: 3.9 mmol/L (ref 3.5–5.1)
Sodium: 135 mmol/L (ref 135–145)

## 2019-08-13 LAB — URINE CULTURE: Culture: NO GROWTH

## 2019-08-13 LAB — MAGNESIUM
Magnesium: 1.9 mg/dL (ref 1.7–2.4)
Magnesium: 1.9 mg/dL (ref 1.7–2.4)

## 2019-08-13 LAB — CBC
HCT: 32.3 % — ABNORMAL LOW (ref 39.0–52.0)
Hemoglobin: 9.6 g/dL — ABNORMAL LOW (ref 13.0–17.0)
MCH: 20.4 pg — ABNORMAL LOW (ref 26.0–34.0)
MCHC: 29.7 g/dL — ABNORMAL LOW (ref 30.0–36.0)
MCV: 68.6 fL — ABNORMAL LOW (ref 80.0–100.0)
Platelets: 371 10*3/uL (ref 150–400)
RBC: 4.71 MIL/uL (ref 4.22–5.81)
RDW: 20.3 % — ABNORMAL HIGH (ref 11.5–15.5)
WBC: 17.2 10*3/uL — ABNORMAL HIGH (ref 4.0–10.5)
nRBC: 0.5 % — ABNORMAL HIGH (ref 0.0–0.2)

## 2019-08-13 LAB — GLUCOSE, CAPILLARY
Glucose-Capillary: 139 mg/dL — ABNORMAL HIGH (ref 70–99)
Glucose-Capillary: 144 mg/dL — ABNORMAL HIGH (ref 70–99)
Glucose-Capillary: 102 mg/dL — ABNORMAL HIGH (ref 70–99)
Glucose-Capillary: 156 mg/dL — ABNORMAL HIGH (ref 70–99)
Glucose-Capillary: 177 mg/dL — ABNORMAL HIGH (ref 70–99)

## 2019-08-13 LAB — PHOSPHORUS
Phosphorus: 2.6 mg/dL (ref 2.5–4.6)
Phosphorus: 2.6 mg/dL (ref 2.5–4.6)

## 2019-08-13 MED ORDER — SILVER SULFADIAZINE 1 % EX CREA
TOPICAL_CREAM | Freq: Every day | CUTANEOUS | Status: DC
  Filled 2019-08-13: qty 85

## 2019-08-13 MED ORDER — ASPIRIN 81 MG PO CHEW: 81.0000 mg | CHEWABLE_TABLET | Freq: Every day | ORAL | Status: DC

## 2019-08-13 MED ORDER — VITAL HIGH PROTEIN PO LIQD
1000.0000 mL | ORAL | Status: DC
Start: 2019-08-13 — End: 2019-08-13

## 2019-08-13 MED ORDER — FOLIC ACID 1 MG PO TABS
1.0000 mg | ORAL_TABLET | Freq: Every day | ORAL | Status: DC
  Administered 2019-08-13 – 2019-08-20 (×8): 1 mg
  Filled 2019-08-13 (×8): qty 1

## 2019-08-13 MED ORDER — ATORVASTATIN CALCIUM 80 MG PO TABS
80.0000 mg | ORAL_TABLET | Freq: Every day | ORAL | Status: DC
  Administered 2019-08-13 – 2019-08-20 (×8): 80 mg
  Filled 2019-08-13 (×8): qty 1

## 2019-08-13 MED ORDER — METOPROLOL TARTRATE 25 MG PO TABS
25.0000 mg | ORAL_TABLET | Freq: Two times a day (BID) | ORAL | Status: DC
  Administered 2019-08-13: 25 mg via ORAL
  Filled 2019-08-13 (×2): qty 1

## 2019-08-13 MED ORDER — LABETALOL HCL 5 MG/ML IV SOLN
5.0000 mg | INTRAVENOUS | Status: DC | PRN
  Administered 2019-08-13: 20 mg via INTRAVENOUS
  Filled 2019-08-13: qty 4

## 2019-08-13 MED ORDER — PRO-STAT SUGAR FREE PO LIQD
30.0000 mL | Freq: Two times a day (BID) | ORAL | Status: DC
Start: 2019-08-13 — End: 2019-08-13

## 2019-08-13 MED ORDER — ASPIRIN 81 MG PO CHEW
324.0000 mg | CHEWABLE_TABLET | Freq: Every day | ORAL | Status: DC
  Administered 2019-08-14 – 2019-08-20 (×7): 324 mg
  Filled 2019-08-13 (×7): qty 4

## 2019-08-13 MED ORDER — THIAMINE HCL 100 MG PO TABS
100.0000 mg | ORAL_TABLET | Freq: Every day | ORAL | Status: DC
  Administered 2019-08-14 – 2019-08-23 (×10): 100 mg
  Filled 2019-08-13 (×10): qty 1

## 2019-08-13 MED ORDER — CLOPIDOGREL BISULFATE 75 MG PO TABS
75.0000 mg | ORAL_TABLET | Freq: Every day | ORAL | Status: DC
  Administered 2019-08-13: 75 mg
  Filled 2019-08-13: qty 1

## 2019-08-13 MED ORDER — OSMOLITE 1.5 CAL PO LIQD
1000.0000 mL | ORAL | Status: DC
  Administered 2019-08-13 – 2019-08-17 (×5): 1000 mL
  Filled 2019-08-13 (×10): qty 1000

## 2019-08-13 MED ORDER — PRO-STAT SUGAR FREE PO LIQD
30.0000 mL | Freq: Three times a day (TID) | ORAL | Status: DC
  Administered 2019-08-13 – 2019-08-18 (×16): 30 mL
  Filled 2019-08-13 (×15): qty 30

## 2019-08-13 MED ORDER — PROSIGHT PO TABS
1.0000 | ORAL_TABLET | Freq: Every day | ORAL | Status: DC
  Filled 2019-08-13 (×2): qty 1

## 2019-08-13 MED ORDER — ENOXAPARIN SODIUM 40 MG/0.4ML ~~LOC~~ SOLN
40.0000 mg | SUBCUTANEOUS | Status: DC
  Administered 2019-08-13 – 2019-08-24 (×12): 40 mg via SUBCUTANEOUS
  Filled 2019-08-13 (×12): qty 0.4

## 2019-08-13 NOTE — Procedures (Signed)
Cortrak  Person Inserting Tube:  Zyan Coby, RD Tube Type:  Cortrak - 43 inches Tube Location:  Right nare Initial Placement:  Stomach Secured by: Bridle Technique Used to Measure Tube Placement:  Documented cm marking at nare/ corner of mouth Cortrak Secured At:  73 cm   No x-ray is required. RN may begin using tube.    If the tube becomes dislodged please keep the tube and contact the Cortrak team at [email or web address] (password [email or web address]) for replacement.  If after hours and replacement cannot be delayed, place a NG tube and confirm placement with an abdominal x-ray.    Mariana Single RD, LDN Clinical Nutrition Pager listed in [place]

## 2019-08-13 NOTE — Progress Notes (Addendum)
Patient received from 4N,vital signs obtained, pt oriented to room, call bell within reach, bed in lowest position.

## 2019-08-13 NOTE — Progress Notes (Signed)
Initial Nutrition Assessment  DOCUMENTATION CODES:   Not applicable  INTERVENTION:   Initiate tube feeding via Cortrak tube (gastric): Osmolite 1.5 at 50 ml/h (1200 ml per day) Pro-stat 30 ml TID  Provides 2100 kcal, 120 gm protein, 916 ml free water daily   NUTRITION DIAGNOSIS:   Inadequate oral intake related to inability to eat as evidenced by NPO status.  GOAL:   Patient will meet greater than or equal to 90% of their needs  MONITOR:   Diet advancement, TF tolerance  REASON FOR ASSESSMENT:   Consult Enteral/tube feeding initiation and management  ASSESSMENT:   Pt with PMH of HTN, DM, HLD, CHF, and heavy ETOH use admitted with L large MCA infarct s/p tPA and IR for revascularization.   Pt discussed during ICU rounds and with RN.  Pt with aphasia, L gaze preference, and R hemiplegia.   6/2 failed swallow eval  6/3  per MD Large evolving L MCA infarct w/ hemorrhagic transformation. Also L ACA infarct and ACA/PCA watershed territory.  Medications reviewed and include: folic acid, SSI, thiamine  Cleviprex currently off, plan to start oral hypertensive via tube Labs reviewed CBG's: [id number]    NUTRITION - FOCUSED PHYSICAL EXAM:    Most Recent Value  Orbital Region  No depletion  Upper Arm Region  No depletion  Thoracic and Lumbar Region  No depletion  Buccal Region  No depletion  Temple Region  No depletion  Clavicle Bone Region  Moderate depletion  Clavicle and Acromion Bone Region  No depletion  Scapular Bone Region  Unable to assess  Dorsal Hand  No depletion  Patellar Region  No depletion  Anterior Thigh Region  No depletion  Posterior Calf Region  No depletion  Edema (RD Assessment)  None  Hair  Reviewed  Eyes  Unable to assess  Mouth  Unable to assess  Skin  Reviewed  Nails  Reviewed       Diet Order:   Diet Order            Diet NPO time specified  Diet effective now              EDUCATION NEEDS:   No education needs have  been identified at this time  Skin:  Skin Assessment: Skin Integrity Issues: Skin Integrity Issues:: Stage II Stage II: penis  Last BM:  unknown  Height:   Ht Readings from Last 1 Encounters:  05/21/17 5\' 11"  (1.803 m)    Weight:   Wt Readings from Last 1 Encounters:  08/10/19 100.1 kg    Ideal Body Weight:     BMI:  Body mass index is 30.78 kg/m.  Estimated Nutritional Needs:   Kcal:  2200-2400  Protein:  120-140 grams  Fluid:  2 L/day  Lockie Pares., RD, LDN, CNSC See AMiON for contact information

## 2019-08-13 NOTE — Progress Notes (Signed)
SLP Cancellation Note  Patient Details Name: Jen Eppinger MRN: [id number] DOB: 09-07-1945   Cancelled treatment:       Reason Eval/Treat Not Completed: Fatigue/lethargy limiting ability to participate. Pt not appropriate for PO trials today per RN. Plan is to pursue cortrak. Will f/u as able.    Osie Bond., M.A. [place] Acute Rehabilitation Services Pager [phone] Office [phone]  08/13/2019, 10:31 AM

## 2019-08-13 NOTE — Consult Note (Signed)
WOC Nurse Consult Note: Reason for Consult: pressure injury penis Wound type: medical device related pressure injury; partial thickness  Pressure Injury POA: Yes Measurement:circufrencial  Wound bed: pink Drainage (amount, consistency, odor) minimal Periwound: intact  Dressing procedure/placement/frequency: Silvadene to affected areas; using male external urinary collection device to contain urine.  Condom catheter discontinued.   [place], [place], [place]

## 2019-08-13 NOTE — Plan of Care (Signed)
Late entry, premorbid mRS = 0. 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   Rosalin Hawking, MD PhD Stroke Neurology 08/13/2019 9:43 AM

## 2019-08-13 NOTE — Progress Notes (Signed)
STROKE TEAM PROGRESS NOTE   INTERVAL HISTORY RN is at the bedside. Pt neuro unchanged, still has global aphasia, right hemiplegia and left gaze. Had cortrak placed. Fever at 100.7, will continue rocephin.   Vitals:   08/13/19 1300 08/13/19 1439 08/13/19 1649 08/13/19 1946  BP: (!) 165/81 (!) 178/110 (!) 169/79 (!) 149/87  Pulse: 94 90 90 100  Resp: (!) 21 18 20 20   Temp:  98 F (36.7 C) 97.8 F (36.6 C) (!) 100.7 F (38.2 C)  TempSrc:  Oral Oral Oral  SpO2: 98% 97% 99% 99%  Weight:       CBC:  Recent Labs  Lab 08/10/19 1730 08/10/19 1732 08/12/19 0651 08/13/19 0726  WBC 12.2*   < > 14.5* 17.2*  NEUTROABS 6.1  --   --   --   HGB 8.9*   < > 10.2* 9.6*  HCT 30.5*   < > 30.1* 32.3*  MCV 70.1*   < > 69.0* 68.6*  PLT 466*   < > 374 371   < > = values in this interval not displayed.   Basic Metabolic Panel:  Recent Labs  Lab 08/12/19 0651 08/13/19 0726 08/13/19 1218 08/13/19 1638  NA 134* 135  --   --   K 4.9 3.9  --   --   CL 98 101  --   --   CO2 24 23  --   --   GLUCOSE 135* 133*  --   --   BUN 9 7*  --   --   CREATININE 0.92 1.07  --   --   CALCIUM 8.1* 8.3*  --   --   MG  --   --  1.9 1.9  PHOS  --   --  2.6 2.6   Lipid Panel:     Component Value Date/Time   CHOL 189 08/11/2019 0625   TRIG 64 08/11/2019 0625   HDL 49 08/11/2019 0625   CHOLHDL 3.9 08/11/2019 0625   VLDL 13 08/11/2019 0625   LDLCALC 127 (H) 08/11/2019 0625   HgbA1c:  Lab Results  Component Value Date   HGBA1C 5.4 08/11/2019   Urine Drug Screen:     Component Value Date/Time   LABOPIA NONE DETECTED 08/11/2019 0343   COCAINSCRNUR NONE DETECTED 08/11/2019 0343   LABBENZ NONE DETECTED 08/11/2019 0343   AMPHETMU NONE DETECTED 08/11/2019 0343   THCU NONE DETECTED 08/11/2019 0343   LABBARB NONE DETECTED 08/11/2019 0343    Alcohol Level     Component Value Date/Time   ETH 46 (H) 08/10/2019 1730    IMAGING past 24 hours DG CHEST PORT 1 VIEW  Result Date: 08/13/2019 CLINICAL DATA:   Leukocytosis.  History of CHF, diabetes. EXAM: PORTABLE CHEST 1 VIEW COMPARISON:  Chest x-ray 06/02/2017.  CT 05/10/2017. FINDINGS: Feeding tube noted with tip below left hemidiaphragm. Stable right paratracheal soft tissue prominence consistent with known thyroid enlargement. Mediastinum and hilar structures are stable. Cardiomegaly with mild pulmonary venous congestion and bibasilar interstitial prominence. CHF cannot be excluded. Low lung volumes with bibasilar atelectasis. Mild left base infiltrate cannot be excluded. Interval resolution of previously identified large left pleural effusion. No pneumothorax. Old rib fractures again noted. Surgical clips left upper quadrant. IMPRESSION: 1.  Feeding tube noted with tip below left hemidiaphragm. 2. Cardiomegaly with mild pulmonary venous congestion and bibasilar interstitial prominence. Mild CHF cannot be excluded. Pneumonitis cannot be excluded. 3. Mild left base infiltrate cannot be excluded. Previously identified large left base pleural  effusion has resolved. Electronically Signed   By: Marcello Moores  Register   On: 08/13/2019 14:18    PHYSICAL EXAM    Temp:  [97.8 F (36.6 C)-100.7 F (38.2 C)] 100.7 F (38.2 C) (06/04 1946) Pulse Rate:  [74-103] 100 (06/04 1946) Resp:  [16-22] 20 (06/04 1946) BP: (149-181)/(78-110) 149/87 (06/04 1946) SpO2:  [94 %-100 %] 99 % (06/04 1946)  General - well nourished, well developed, in no apparent distress.    Ophthalmologic - fundi not visualized due to noncooperation.    Cardiovascular - regular rhythm and rate with frequent PVCs on tele  Neuro - awake, eyes open, but global aphasia, not following commands, non verbal. Eyes left gaze preference, not cross midline. Mild right nasolabial fold flattening. Not blinking to visual threat on the right, but able to blink to visual threat on the left. LUE and LLE spontaneous movement against gravity, but RUE and RLE flaccid. Sensation, coordination and gait not  tested.  ASSESSMENT/PLAN [name] is a 74 y.o. male with history of HTN, DM, HLD, heavy alcohol who had sudden onset aphasia, L gaze preference and R hemiplegia. EKG showed AF en route. High BP treated with labetalol. Received tPA 08/10/2019 at 1749.  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 ASPECTS 10.     CTA head proximal to mid L M2 occlusion. Mild B ICA atherosclerosis.  CTA neck B ICA bifurcation and bulbs atherosclerosis. L VA origin atherosclerosis. 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 [place] recommended  Cerebral angio L M3 superior branch occlusion w/ subocclusive filling defect.    Post IR CT Minimal contrast extravasation L M3 branch  CT 6/2 progression of L MCA infarct, hypodensity L middle frontal gyrus extending into L parietal and L occipital lobe L sylvian fissure SAH.  MRI  Large evolving L MCA infarct w/ hemorrhagic transformation w/o discrete hematoma. Also L ACA infarct and ACA/PCA watershed territory. No significant mass effect.   MRA  Motion. Proximal L M2 branch occlusion   2D Echo EF 50-55%. No source of embolus. B atrial mildly dilated.  LE venous doppler no DVT  LDL 127  HgbA1c 5.4  SCDs for VTE prophylaxis  No antithrombotic prior to admission, on ASA 325. Not on DAPT due to possible PEG placement.    Therapy recommendations:  pending   Disposition:  pending   Hypertensive Emergency  SBP 211/133 on arrival  Home meds:  norvasc 10, lasix 20, hydralazine 100 q8, metoprolol 25 bid (pt not taking)  Labetalol prior to tPA  Off cleviprex gtt    Stable on the high end . SBP goal < 180 . Resume metoprolol 25 bid . Long-term BP goal 130-150 given left M2 occlusion  Hyperlipidemia  Home meds:  lipitor 40  LDL 127, goal < 70  On lipitor 80   Resume statin at discharge   Diabetes type II, hyperglycemia    Home meds:  Glipizide 10, metformin 500 bid  HgbA1c 5.4, goal < 7.0  CBGs   SSI  PCP follow up  ?? Afib  EMS ECG showed probably afib  So far tele in hospital not in afib  Pt will need loop recorder to rule out afib if his neuro status improves  Dysphagia . Secondary to stroke . NPO . Speech on board . S/p cortrak  . On TF @ 35 and IVF @ 50 .  May need PEG    Fever Leukocytosis   Fever 100.7  WBC 12.2 ->11.7->14.5-> 17.2  UA large LE, 21-50 WBC, few bacteria.  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 [place] recommended  Hyperkalemia 3.3->5.8->4.9 - resolved  CKD 1.34->1.32->0.92 ->1.07  Hospital day # 3  Patient continues to be critically ill for the last 24 hours, has developed fever, worsening leukocytosis and elevated BP, continues to have left gaze, right hemiplegia and global aphasia, and I added IV Abx, placed cortrak, started TF, and ordered BP meds and statin.     This patient is critically ill due to left MCA large infarct, cerebral edema, alcohol abusse and at significant risk of neurological worsening, death form recurrent stroke, brain herniation, hemorrhagic  Conversion, delirium tremor, aspiration. This patient's care requires constant monitoring of vital signs, hemodynamics, respiratory and cardiac monitoring, review of multiple databases, neurological assessment, discussion with family, other specialists and medical decision  making of high complexity. I spent 35 minutes of neurocritical care time in the care of this patient.   Rosalin Hawking, MD PhD Stroke Neurology 08/13/2019 8:36 PM     To contact Stroke Continuity provider, please refer to [email or web address] After hours, contact General Neurology

## 2019-08-14 ENCOUNTER — Inpatient Hospital Stay (HOSPITAL_COMMUNITY): Payer: Medicare Other

## 2019-08-14 LAB — BASIC METABOLIC PANEL
Anion gap: 9 (ref 5–15)
BUN: 18 mg/dL (ref 8–23)
CO2: 21 mmol/L — ABNORMAL LOW (ref 22–32)
Calcium: 8.3 mg/dL — ABNORMAL LOW (ref 8.9–10.3)
Chloride: 107 mmol/L (ref 98–111)
Creatinine, Ser: 1.29 mg/dL — ABNORMAL HIGH (ref 0.61–1.24)
GFR calc Af Amer: >60 mL/min (ref >60)
GFR calc non Af Amer: 54 mL/min — ABNORMAL LOW (ref >60)
Glucose, Bld: 192 mg/dL — ABNORMAL HIGH (ref 70–99)
Potassium: 3.5 mmol/L (ref 3.5–5.1)
Sodium: 137 mmol/L (ref 135–145)

## 2019-08-14 LAB — CBC
HCT: 31.1 % — ABNORMAL LOW (ref 39.0–52.0)
Hemoglobin: 9 g/dL — ABNORMAL LOW (ref 13.0–17.0)
MCH: 20.3 pg — ABNORMAL LOW (ref 26.0–34.0)
MCHC: 28.9 g/dL — ABNORMAL LOW (ref 30.0–36.0)
MCV: 70.2 fL — ABNORMAL LOW (ref 80.0–100.0)
Platelets: 418 10*3/uL — ABNORMAL HIGH (ref 150–400)
RBC: 4.43 MIL/uL (ref 4.22–5.81)
RDW: 19.8 % — ABNORMAL HIGH (ref 11.5–15.5)
WBC: 14.1 10*3/uL — ABNORMAL HIGH (ref 4.0–10.5)
nRBC: 0.4 % — ABNORMAL HIGH (ref 0.0–0.2)

## 2019-08-14 LAB — PHOSPHORUS
Phosphorus: 3.1 mg/dL (ref 2.5–4.6)
Phosphorus: 3 mg/dL (ref 2.5–4.6)

## 2019-08-14 LAB — GLUCOSE, CAPILLARY
Glucose-Capillary: 135 mg/dL — ABNORMAL HIGH (ref 70–99)
Glucose-Capillary: 163 mg/dL — ABNORMAL HIGH (ref 70–99)
Glucose-Capillary: 170 mg/dL — ABNORMAL HIGH (ref 70–99)
Glucose-Capillary: 177 mg/dL — ABNORMAL HIGH (ref 70–99)
Glucose-Capillary: 181 mg/dL — ABNORMAL HIGH (ref 70–99)
Glucose-Capillary: 194 mg/dL — ABNORMAL HIGH (ref 70–99)

## 2019-08-14 LAB — MAGNESIUM
Magnesium: 2 mg/dL (ref 1.7–2.4)
Magnesium: 2.1 mg/dL (ref 1.7–2.4)

## 2019-08-14 MED ORDER — LORAZEPAM 1 MG PO TABS: 1.0000 mg | ORAL_TABLET | ORAL | Status: AC | PRN

## 2019-08-14 MED ORDER — AMLODIPINE BESYLATE 10 MG PO TABS
10.0000 mg | ORAL_TABLET | Freq: Every day | ORAL | Status: DC
  Administered 2019-08-15 – 2019-08-23 (×9): 10 mg
  Filled 2019-08-14 (×9): qty 1

## 2019-08-14 MED ORDER — ADULT MULTIVITAMIN LIQUID CH
15.0000 mL | Freq: Every day | ORAL | Status: DC
  Administered 2019-08-14 – 2019-08-23 (×10): 15 mL
  Filled 2019-08-14 (×10): qty 15

## 2019-08-14 MED ORDER — METOPROLOL TARTRATE 25 MG/10 ML ORAL SUSPENSION
25.0000 mg | Freq: Two times a day (BID) | ORAL | Status: DC
  Administered 2019-08-14 – 2019-08-23 (×19): 25 mg
  Filled 2019-08-14 (×22): qty 10

## 2019-08-14 MED ORDER — LORAZEPAM 2 MG/ML IJ SOLN: 1.0000 mg | INTRAMUSCULAR | Status: AC | PRN

## 2019-08-14 MED ORDER — WHITE PETROLATUM EX OINT
TOPICAL_OINTMENT | CUTANEOUS | Status: AC
  Filled 2019-08-14: qty 28.35

## 2019-08-14 MED ORDER — FREE WATER
200.0000 mL | Status: DC
  Administered 2019-08-14 – 2019-08-23 (×52): 200 mL

## 2019-08-14 MED ORDER — AMLODIPINE BESYLATE 10 MG PO TABS: 10.0000 mg | ORAL_TABLET | Freq: Every day | ORAL | Status: DC

## 2019-08-14 NOTE — Progress Notes (Signed)
STROKE TEAM PROGRESS NOTE   INTERVAL HISTORY  Pt younger son at bedside. Pt seems more awake alert than before, eyes open, mumbling words, intangible. Left forced gaze improved but still not cross midline. Cre elevated to 1.29 but leukocytosis improved. Spiking fever last night 101.3. this morning no fever, BP on the high side.   Vitals:   08/14/19 0145 08/14/19 0356 08/14/19 0829 08/14/19 1156  BP:  (!) 143/87 140/88 (!) 164/83  Pulse:  80 82 83  Resp:  18 20 18   Temp: 100.2 F (37.9 C) (!) 97.4 F (36.3 C) 97.6 F (36.4 C) 98.4 F (36.9 C)  TempSrc: Oral Oral Oral Oral  SpO2:  98% 98% 97%  Weight:       CBC:  Recent Labs  Lab 08/10/19 1730 08/10/19 1732 08/13/19 0726 08/14/19 0623  WBC 12.2*   < > 17.2* 14.1*  NEUTROABS 6.1  --   --   --   HGB 8.9*   < > 9.6* 9.0*  HCT 30.5*   < > 32.3* 31.1*  MCV 70.1*   < > 68.6* 70.2*  PLT 466*   < > 371 418*   < > = values in this interval not displayed.   Basic Metabolic Panel:  Recent Labs  Lab 08/13/19 0726 08/13/19 1218 08/13/19 1638 08/14/19 0623  NA 135  --   --  137  K 3.9  --   --  3.5  CL 101  --   --  107  CO2 23  --   --  21*  GLUCOSE 133*  --   --  192*  BUN 7*  --   --  18  CREATININE 1.07  --   --  1.29*  CALCIUM 8.3*  --   --  8.3*  MG  --    < > 1.9 2.0  PHOS  --    < > 2.6 3.1   < > = values in this interval not displayed.   Lipid Panel:     Component Value Date/Time   CHOL 189 08/11/2019 0625   TRIG 64 08/11/2019 0625   HDL 49 08/11/2019 0625   CHOLHDL 3.9 08/11/2019 0625   VLDL 13 08/11/2019 0625   LDLCALC 127 (H) 08/11/2019 0625   HgbA1c:  Lab Results  Component Value Date   HGBA1C 5.4 08/11/2019   Urine Drug Screen:     Component Value Date/Time   LABOPIA NONE DETECTED 08/11/2019 0343   COCAINSCRNUR NONE DETECTED 08/11/2019 0343   LABBENZ NONE DETECTED 08/11/2019 0343   AMPHETMU NONE DETECTED 08/11/2019 0343   THCU NONE DETECTED 08/11/2019 0343   LABBARB NONE DETECTED 08/11/2019  0343    Alcohol Level     Component Value Date/Time   ETH 46 (H) 08/10/2019 1730    IMAGING past 24 hours  DG CHEST PORT 1 VIEW  Result Date: 08/14/2019 CLINICAL DATA:  Fever EXAM: PORTABLE CHEST 1 VIEW COMPARISON:  Chest radiograph 08/13/2019. FINDINGS: Monitoring leads overlie the patient. Enteric tube courses inferior to the diaphragm. Stable enlarged cardiac and mediastinal contours. Bibasilar heterogeneous opacities. Multiple bilateral rib fractures. IMPRESSION: Cardiomegaly. Bibasilar heterogeneous opacities may represent atelectasis. Infection not excluded. Electronically Signed   By: Lovey Newcomer M.D.   On: 08/14/2019 14:09    PHYSICAL EXAM  Temp:  [97.4 F (36.3 C)-101.3 F (38.5 C)] 98.4 F (36.9 C) (06/05 1156) Pulse Rate:  [80-100] 83 (06/05 1156) Resp:  [18-20] 18 (06/05 1156) BP: (140-169)/(79-88) 164/83 (06/05 1156) SpO2:  [  97 %-99 %] 97 % (06/05 1156)  General - well nourished, well developed, in no apparent distress.    Ophthalmologic - fundi not visualized due to noncooperation.    Cardiovascular - regular rhythm and rate with frequent PVCs on tele  Neuro - awake, eyes open, but global aphasia, not following commands, mumbling words but intangible. Eyes left gaze preference, barely cross midline. Mild right nasolabial fold flattening. Not blinking to visual threat on the right, but able to blink to visual threat on the left. LUE and LLE spontaneous movement against gravity, but RUE and RLE flaccid. Sensation, coordination and gait not tested.  ASSESSMENT/PLAN Malik [name] is a 74 y.o. male with history of HTN, DM, HLD, heavy alcohol who had sudden onset aphasia, L gaze preference and R hemiplegia. EKG showed AF en route. High BP treated with labetalol. Received tPA 08/10/2019 at 1749.  Stroke:   L MCA infarct s/p tPA and attempted IR L Z6/W1 occlusion, embolic secondary to unknown source   Code Stroke CT head No acute abnormality. ASPECTS 10.     CTA  head proximal to mid L M2 occlusion. Mild B ICA atherosclerosis.  CTA neck B ICA bifurcation and bulbs atherosclerosis. L VA origin atherosclerosis. 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 [place] recommended  Cerebral angio L M3 superior branch occlusion w/ subocclusive filling defect.    Post IR CT Minimal contrast extravasation L M3 branch  CT 6/2 progression of L MCA infarct, hypodensity L middle frontal gyrus extending into L parietal and L occipital lobe L sylvian fissure SAH.  MRI  Large evolving L MCA infarct w/ hemorrhagic transformation w/o discrete hematoma. Also L ACA infarct and ACA/PCA watershed territory. No significant mass effect.   MRA  Motion. Proximal L M2 branch occlusion   2D Echo EF 50-55%. No source of embolus. B atrial mildly dilated.  LE venous doppler no DVT  LDL 127  HgbA1c 5.4  SCDs for VTE prophylaxis  No antithrombotic prior to admission, on ASA 325. Not on DAPT due to possible PEG placement.    Therapy recommendations:  SNF recommended  Disposition:  pending   Hypertensive Emergency  SBP 211/133 on arrival  Home meds:  norvasc 10, lasix 20, hydralazine 100 q8, metoprolol 25 bid (pt not taking)  Labetalol prior to tPA  Off cleviprex gtt    Stable on the high end . SBP goal < 180 . Resume metoprolol 25 bid and amlodipine 10 . Long-term BP goal 130-150 given left M2 occlusion  Hyperlipidemia  Home meds:  lipitor 40  LDL 127, goal < 70  On lipitor 80   Resume statin at discharge   Diabetes type II, hyperglycemia   Home meds:  Glipizide 10, metformin 500 bid  HgbA1c 5.4, goal < 7.0  CBGs   SSI  PCP follow up  ?? Afib  EMS ECG showed probably afib  So far tele in hospital not in afib  Pt will need loop recorder to rule out afib if his neuro status improves  Dysphagia . Secondary to stroke . NPO . Speech on board . S/p cortrak  . On TF @ 50 and IVF @ 50 and add FW 200 Q4 . May need PEG     Fever Leukocytosis   Fever 100.7->101.3  WBC 12.2 ->11.7->14.5->17.2->14.1  UA large LE, 21-50 WBC, few bacteria.  UCx neg  CXR - Cardiomegaly. Bibasilar atelectasis. Infection not excluded.  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 [place] recommended  Hyperkalemia 3.3->5.8->4.9 - resolved ->3.5  Hospital day # 4  I had long discussion with son at bedside, updated pt current condition, treatment plan and potential prognosis, and answered all the questions. He expressed understanding and appreciation.    Rosalin Hawking, MD PhD Stroke Neurology 08/14/2019 5:48 PM  To contact Stroke Continuity provider, please refer to [email or web address] After hours, contact General Neurology

## 2019-08-14 NOTE — Plan of Care (Signed)
  Problem: Education: Goal: Knowledge of disease or condition will improve Outcome: Not Progressing Goal: Knowledge of secondary prevention will improve Outcome: Not Progressing Goal: Knowledge of patient specific risk factors addressed and post discharge goals established will improve Outcome: Not Progressing Goal: Individualized Educational Video(s) Outcome: Not Progressing   Problem: Coping: Goal: Will verbalize positive feelings about self Outcome: Not Progressing Goal: Will identify appropriate support needs Outcome: Not Progressing   Problem: Health Behavior/Discharge Planning: Goal: Ability to manage health-related needs will improve Outcome: Not Progressing   Problem: Self-Care: Goal: Ability to participate in self-care as condition permits will improve Outcome: Not Progressing Goal: Verbalization of feelings and concerns over difficulty with self-care will improve Outcome: Not Progressing Goal: Ability to communicate needs accurately will improve Outcome: Not Progressing   Problem: Nutrition: Goal: Risk of aspiration will decrease Outcome: Not Progressing Goal: Dietary intake will improve Outcome: Not Progressing   Problem: Ischemic Stroke/TIA Tissue Perfusion: Goal: Complications of ischemic stroke/TIA will be minimized Outcome: Not Progressing

## 2019-08-15 LAB — CBC
HCT: 30.6 % — ABNORMAL LOW (ref 39.0–52.0)
Hemoglobin: 8.9 g/dL — ABNORMAL LOW (ref 13.0–17.0)
MCH: 20.6 pg — ABNORMAL LOW (ref 26.0–34.0)
MCHC: 29.1 g/dL — ABNORMAL LOW (ref 30.0–36.0)
MCV: 70.7 fL — ABNORMAL LOW (ref 80.0–100.0)
Platelets: 412 10*3/uL — ABNORMAL HIGH (ref 150–400)
RBC: 4.33 MIL/uL (ref 4.22–5.81)
RDW: 19.9 % — ABNORMAL HIGH (ref 11.5–15.5)
WBC: 13.4 10*3/uL — ABNORMAL HIGH (ref 4.0–10.5)
nRBC: 0 % (ref 0.0–0.2)

## 2019-08-15 LAB — GLUCOSE, CAPILLARY
Glucose-Capillary: 124 mg/dL — ABNORMAL HIGH (ref 70–99)
Glucose-Capillary: 135 mg/dL — ABNORMAL HIGH (ref 70–99)
Glucose-Capillary: 145 mg/dL — ABNORMAL HIGH (ref 70–99)
Glucose-Capillary: 154 mg/dL — ABNORMAL HIGH (ref 70–99)
Glucose-Capillary: 161 mg/dL — ABNORMAL HIGH (ref 70–99)
Glucose-Capillary: 189 mg/dL — ABNORMAL HIGH (ref 70–99)
Glucose-Capillary: 203 mg/dL — ABNORMAL HIGH (ref 70–99)

## 2019-08-15 LAB — BASIC METABOLIC PANEL
Anion gap: 8 (ref 5–15)
BUN: 14 mg/dL (ref 8–23)
CO2: 21 mmol/L — ABNORMAL LOW (ref 22–32)
Calcium: 8.2 mg/dL — ABNORMAL LOW (ref 8.9–10.3)
Chloride: 109 mmol/L (ref 98–111)
Creatinine, Ser: 1.12 mg/dL (ref 0.61–1.24)
GFR calc Af Amer: >60 mL/min (ref >60)
GFR calc non Af Amer: >60 mL/min (ref >60)
Glucose, Bld: 210 mg/dL — ABNORMAL HIGH (ref 70–99)
Potassium: 3.9 mmol/L (ref 3.5–5.1)
Sodium: 138 mmol/L (ref 135–145)

## 2019-08-15 NOTE — Progress Notes (Signed)
STROKE TEAM PROGRESS NOTE   INTERVAL HISTORY   No family member at bedside. Pt patient is drowsy but can be aroused, mumbling words, intangible. Left forced gaze improved but still not cross midline. Cre now normalized at 1.12 and leukocytosis improved to 13.4.Marland Kitchen   this morning no fever, BP on the high side.  No neurological changes. Vitals:   08/15/19 0500 08/15/19 0811 08/15/19 1038 08/15/19 1230  BP:  (!) 164/89 (!) 160/101 (!) 166/81  Pulse:  84 85 86  Resp:  18  18  Temp:  99.1 F (37.3 C)  99.1 F (37.3 C)  TempSrc:  Oral  Oral  SpO2:  97%  95%  Weight: 93.8 kg     Height:       CBC:  Recent Labs  Lab 08/10/19 1730 08/10/19 1732 08/14/19 0623 08/15/19 0545  WBC 12.2*   < > 14.1* 13.4*  NEUTROABS 6.1  --   --   --   HGB 8.9*   < > 9.0* 8.9*  HCT 30.5*   < > 31.1* 30.6*  MCV 70.1*   < > 70.2* 70.7*  PLT 466*   < > 418* 412*   < > = values in this interval not displayed.   Basic Metabolic Panel:  Recent Labs  Lab 08/14/19 0623 08/14/19 1642 08/15/19 0545  NA 137  --  138  K 3.5  --  3.9  CL 107  --  109  CO2 21*  --  21*  GLUCOSE 192*  --  210*  BUN 18  --  14  CREATININE 1.29*  --  1.12  CALCIUM 8.3*  --  8.2*  MG 2.0 2.1  --   PHOS 3.1 3.0  --    Lipid Panel:     Component Value Date/Time   CHOL 189 08/11/2019 0625   TRIG 64 08/11/2019 0625   HDL 49 08/11/2019 0625   CHOLHDL 3.9 08/11/2019 0625   VLDL 13 08/11/2019 0625   LDLCALC 127 (H) 08/11/2019 0625   HgbA1c:  Lab Results  Component Value Date   HGBA1C 5.4 08/11/2019   Urine Drug Screen:     Component Value Date/Time   LABOPIA NONE DETECTED 08/11/2019 0343   COCAINSCRNUR NONE DETECTED 08/11/2019 0343   LABBENZ NONE DETECTED 08/11/2019 0343   AMPHETMU NONE DETECTED 08/11/2019 0343   THCU NONE DETECTED 08/11/2019 0343   LABBARB NONE DETECTED 08/11/2019 0343    Alcohol Level     Component Value Date/Time   ETH 46 (H) 08/10/2019 1730    IMAGING past 24 hours  No results  found.  PHYSICAL EXAM    Temp:  [97.6 F (36.4 C)-99.1 F (37.3 C)] 99.1 F (37.3 C) (06/06 1230) Pulse Rate:  [84-90] 86 (06/06 1230) Resp:  [18-22] 18 (06/06 1230) BP: (139-166)/(58-101) 166/81 (06/06 1230) SpO2:  [95 %-100 %] 95 % (06/06 1230) Weight:  [93.8 kg-95.2 kg] 93.8 kg (06/06 0500)  General - well nourished, well developed, in no apparent distress.    Ophthalmologic - fundi not visualized due to noncooperation.    Cardiovascular - regular rhythm and rate   Neuro - awake, eyes open, but global aphasia, not following commands, mumbling words but intangible. Eyes left gaze preference, barely cross midline. Mild right nasolabial fold flattening. Not blinking to visual threat on the right, but able to blink to visual threat on the left. LUE and LLE spontaneous movement against gravity, but RUE and RLE flaccid. Sensation, coordination and gait not tested.  ASSESSMENT/PLAN [name] is a 74 y.o. male with history of HTN, DM, HLD, heavy alcohol who had sudden onset aphasia, L gaze preference and R hemiplegia. EKG showed AF en route. High BP treated with labetalol. Received tPA 08/10/2019 at 1749.  Stroke:   L MCA infarct s/p tPA and attempted IR L J8/J1 occlusion, embolic secondary to unknown source   Code Stroke CT head No acute abnormality. ASPECTS 10.     CTA head proximal to mid L M2 occlusion. Mild B ICA atherosclerosis.  CTA neck B ICA bifurcation and bulbs atherosclerosis. L [place] origin atherosclerosis. 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 [place] recommended  Cerebral angio L M3 superior branch occlusion w/ subocclusive filling defect.    Post IR CT Minimal contrast extravasation L M3 branch  CT 6/2 progression of L MCA infarct, hypodensity L middle frontal gyrus extending into L parietal and L occipital lobe L sylvian fissure SAH.  MRI  Large evolving L MCA infarct w/ hemorrhagic transformation w/o discrete hematoma. Also L ACA infarct and  ACA/PCA watershed territory. No significant mass effect.   MRA  Motion. Proximal L M2 branch occlusion   2D Echo EF 50-55%. No source of embolus. B atrial mildly dilated.  LE venous doppler no DVT  LDL 127  HgbA1c 5.4  SCDs for VTE prophylaxis  No antithrombotic prior to admission, on ASA 325. Not on DAPT due to possible PEG placement.    Therapy recommendations:  SNF recommended  Disposition:  pending   Hypertensive Emergency  SBP 211/133 on arrival  Home meds:  norvasc 10, lasix 20, hydralazine 100 q8, metoprolol 25 bid (pt not taking)  Labetalol prior to tPA  Off cleviprex gtt    Stable on the high end . SBP goal < 180 . Resume metoprolol 25 bid and amlodipine 10 . Long-term BP goal 130-150 given left M2 occlusion  Hyperlipidemia  Home meds:  lipitor 40  LDL 127, goal < 70  On lipitor 80   Resume statin at discharge   Diabetes type II, hyperglycemia   Home meds:  Glipizide 10, metformin 500 bid  HgbA1c 5.4, goal < 7.0  CBGs   SSI  PCP follow up  ?? Afib  EMS ECG showed probably afib  So far tele in hospital not in afib  Pt will need loop recorder to rule out afib if his neuro status improves  Dysphagia . Secondary to stroke . NPO . Speech on board . S/p cortrak  . On TF @ 50 and IVF @ 50 and add FW 200 Q4 . May need PEG    Fever Leukocytosis   Fever 100.7->101.3  WBC 12.2 ->11.7->14.5->17.2->14.1-13.2  UA large LE, 21-50 WBC, few bacteria.  UCx neg  CXR - Cardiomegaly. Bibasilar atelectasis. Infection not excluded.  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 [place] recommended  Hyperkalemia 3.3->5.8->4.9 - resolved ->3.5  Hospital day # 5  Continue ongoing medical management.  Will need to discuss PEG tube placement with family next week when available.  Antony Contras, MD.   To contact Stroke Continuity provider, please refer to [email or web address] After hours, contact General Neurology

## 2019-08-16 ENCOUNTER — Inpatient Hospital Stay (HOSPITAL_COMMUNITY): Payer: Medicare Other

## 2019-08-16 LAB — GLUCOSE, CAPILLARY
Glucose-Capillary: 124 mg/dL — ABNORMAL HIGH (ref 70–99)
Glucose-Capillary: 144 mg/dL — ABNORMAL HIGH (ref 70–99)
Glucose-Capillary: 145 mg/dL — ABNORMAL HIGH (ref 70–99)
Glucose-Capillary: 146 mg/dL — ABNORMAL HIGH (ref 70–99)
Glucose-Capillary: 151 mg/dL — ABNORMAL HIGH (ref 70–99)
Glucose-Capillary: 163 mg/dL — ABNORMAL HIGH (ref 70–99)

## 2019-08-16 NOTE — Progress Notes (Signed)
STROKE TEAM PROGRESS NOTE   INTERVAL HISTORY   No family member at bedside. Pt patient is drowsy but can be aroused, mumbling words, difficult to understand left forced gaze improved but still not cross midline. this morning no fever, BP on the high side.  No neurological changes. Vitals:   08/16/19 0348 08/16/19 0758 08/16/19 0919 08/16/19 1236  BP: (!) 146/79 (!) 171/92 (!) 176/83 (!) 160/87  Pulse: 87 86  84  Resp: 20 20  18   Temp: 97.7 F (36.5 C) 98.6 F (37 C)  98.7 F (37.1 C)  TempSrc: Oral Oral  Oral  SpO2: 98% 100%  97%  Weight:      Height:       CBC:  Recent Labs  Lab 08/10/19 1730 08/10/19 1732 08/14/19 0623 08/15/19 0545  WBC 12.2*   < > 14.1* 13.4*  NEUTROABS 6.1  --   --   --   HGB 8.9*   < > 9.0* 8.9*  HCT 30.5*   < > 31.1* 30.6*  MCV 70.1*   < > 70.2* 70.7*  PLT 466*   < > 418* 412*   < > = values in this interval not displayed.   Basic Metabolic Panel:  Recent Labs  Lab 08/14/19 0623 08/14/19 1642 08/15/19 0545  NA 137  --  138  K 3.5  --  3.9  CL 107  --  109  CO2 21*  --  21*  GLUCOSE 192*  --  210*  BUN 18  --  14  CREATININE 1.29*  --  1.12  CALCIUM 8.3*  --  8.2*  MG 2.0 2.1  --   PHOS 3.1 3.0  --    Lipid Panel:     Component Value Date/Time   CHOL 189 08/11/2019 0625   TRIG 64 08/11/2019 0625   HDL 49 08/11/2019 0625   CHOLHDL 3.9 08/11/2019 0625   VLDL 13 08/11/2019 0625   LDLCALC 127 (H) 08/11/2019 0625   HgbA1c:  Lab Results  Component Value Date   HGBA1C 5.4 08/11/2019   Urine Drug Screen:     Component Value Date/Time   LABOPIA NONE DETECTED 08/11/2019 0343   COCAINSCRNUR NONE DETECTED 08/11/2019 0343   LABBENZ NONE DETECTED 08/11/2019 0343   AMPHETMU NONE DETECTED 08/11/2019 0343   THCU NONE DETECTED 08/11/2019 0343   LABBARB NONE DETECTED 08/11/2019 0343    Alcohol Level     Component Value Date/Time   ETH 46 (H) 08/10/2019 1730    IMAGING past 24 hours  No results found.  PHYSICAL EXAM    Temp:   [97.7 F (36.5 C)-99.7 F (37.6 C)] 98.7 F (37.1 C) (06/07 1236) Pulse Rate:  [84-88] 84 (06/07 1236) Resp:  [18-20] 18 (06/07 1236) BP: (146-182)/(77-92) 160/87 (06/07 1236) SpO2:  [96 %-100 %] 97 % (06/07 1236)  General - well nourished, well developed, in no apparent distress.    Ophthalmologic - fundi not visualized due to noncooperation.    Cardiovascular - regular rhythm and rate   Neuro - awake, eyes open, but global aphasia, not following commands, mumbling words but intangible. Eyes left gaze preference, barely cross midline. Mild right nasolabial fold flattening. Not blinking to visual threat on the right, but able to blink to visual threat on the left. LUE and LLE spontaneous movement against gravity, but RUE and RLE flaccid. Sensation, coordination and gait not tested.  ASSESSMENT/PLAN [name] is a 74 y.o. male with history of HTN, DM, HLD,  heavy alcohol who had sudden onset aphasia, L gaze preference and R hemiplegia. EKG showed AF en route. High BP treated with labetalol. Received tPA 08/10/2019 at 1749.  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 ASPECTS 10.     CTA head proximal to mid L M2 occlusion. Mild B ICA atherosclerosis.  CTA neck B ICA bifurcation and bulbs atherosclerosis. L VA origin atherosclerosis. 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 [place] recommended  Cerebral angio L M3 superior branch occlusion w/ subocclusive filling defect.    Post IR CT Minimal contrast extravasation L M3 branch  CT 6/2 progression of L MCA infarct, hypodensity L middle frontal gyrus extending into L parietal and L occipital lobe L sylvian fissure SAH.  MRI  Large evolving L MCA infarct w/ hemorrhagic transformation w/o discrete hematoma. Also L ACA infarct and ACA/PCA watershed territory. No significant mass effect.   MRA  Motion. Proximal L  M2 branch occlusion   2D Echo EF 50-55%. No source of embolus. B atrial mildly dilated.  LE venous doppler no DVT  LDL 127  HgbA1c 5.4  SCDs for VTE prophylaxis  No antithrombotic prior to admission, on ASA 325. Not on DAPT due to possible PEG placement.    Therapy recommendations:  SNF recommended  Disposition:  pending   Hypertensive Emergency  SBP 211/133 on arrival  Home meds:  norvasc 10, lasix 20, hydralazine 100 q8, metoprolol 25 bid (pt not taking)  Labetalol prior to tPA  Off cleviprex gtt    Stable on the high end . SBP goal < 180 . Resume metoprolol 25 bid and amlodipine 10 . Long-term BP goal 130-150 given left M2 occlusion  Hyperlipidemia  Home meds:  lipitor 40  LDL 127, goal < 70  On lipitor 80   Resume statin at discharge   Diabetes type II, hyperglycemia   Home meds:  Glipizide 10, metformin 500 bid  HgbA1c 5.4, goal < 7.0  CBGs   SSI  PCP follow up  ?? Afib  EMS ECG showed probably afib  So far tele in hospital not in afib  Pt will need loop recorder to rule out afib if his neuro status improves  Dysphagia . Secondary to stroke . NPO . Speech on board . S/p cortrak  . On TF @ 50 and IVF @ 50 and add FW 200 Q4 . May need PEG    Fever Leukocytosis   Fever 100.7->101.3  WBC 12.2 ->11.7->14.5->17.2->14.1-13.2  UA large LE, 21-50 WBC, few bacteria.  UCx neg  CXR - Cardiomegaly. Bibasilar atelectasis. Infection not excluded.  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 [place] recommended  Hyperkalemia 3.3->5.8->4.9 - resolved ->3.5  Hospital day # 6  Continue ongoing medical  management.  Speech therapy to check modified barium swallow today and in case he fails he will   need to discuss PEG tube placement with family . Antony Contras, MD.   To contact Stroke Continuity provider, please refer to [email or web address] After hours, contact General Neurology

## 2019-08-16 NOTE — Progress Notes (Signed)
Occupational Therapy Treatment Patient Details Name: Malik [name] MRN: [id number] DOB: 08-09-1945 Today's Date: 08/16/2019    History of present illness This 74 y.o. male admitted with sudden onset of aphasia, Lt gaze preference, and Rt hemiplegia.   He was found to have Lt MCA infarct with proximal to mid M2/M3 occlusion.  He is s/p tPA and thrombectomy.  Repeast CT showed significant progression of Lt MCA infarct with hypodensity in teh Lt middle frontal gyrus extending into the Lt parietal and Lt occipital lobe.  Linear hyperdensity in the Lt sylvian fissure likely represents small SAH.  Repeat MRI showed large Lt evolving MCA infarct with evidence of hemorrhagic conversion as well as some watershed involvement in the ACA, PCA, and LT ACA.   PMH includes:  TBI, neuropathy, LVH, HTN, Dilated cardiomyopathy, DM, chronic diastolic CHF.  ETOH abuse.    OT comments  Patient supine in bed, engaged in OT/PT session. Requires total assist +2 for bed mobility, max assist +2 for EOB sitting while engaging in ADLs and continuous hand over hand support to L hand to avoid pushing (heavy pushing towards R side).  Attempted engagement of L hand for washing face and requires hand over hand support, max-total assist.  Leaning on R forearm, sitting balance improved to min-mod assist +2.  Follows minimal commands with L LE given increased time, preference to R gaze but once supine automatically looks out window to L side.  Will follow and assess next session for resting hand splint.    Follow Up Recommendations  SNF    Equipment Recommendations  None recommended by OT    Recommendations for Other Services      Precautions / Restrictions Precautions Precautions: Fall Precaution Comments: Lt pusher, cortrak  Restrictions Weight Bearing Restrictions: No       Mobility Bed Mobility Overal bed mobility: Needs Assistance Bed Mobility: Supine to Sit;Sit to Sidelying;Rolling Rolling: Total assist;+2 for  physical assistance;+2 for safety/equipment   Supine to sit: Total assist;+2 for physical assistance;+2 for safety/equipment;HOB elevated   Sit to sidelying: Total assist;+2 for physical assistance;+2 for safety/equipment;HOB elevated General bed mobility comments: modified helicopter transition to EOB with total assist +2, returned to sidelying with total assist +2 for trunk and LB support  Transfers                 General transfer comment: unable to safely attempt     Balance Overall balance assessment: Needs assistance Sitting-balance support: No upper extremity supported;Feet supported Sitting balance-Leahy Scale: Poor Sitting balance - Comments: requires redirection of L hand onto lap otherwise heavy pusher with L UE to R with max assist +2, lateral lean on R forearm fading to min-mod assist  Postural control: Right lateral lean                                 ADL either performed or assessed with clinical judgement   ADL Overall ADL's : Needs assistance/impaired     Grooming: Wash/dry face;Maximal assistance;Sitting Grooming Details (indicate cue type and reason): requires hand over hand support to engage L UE to face to wash                              Functional mobility during ADLs: Maximal assistance;+2 for physical assistance;+2 for safety/equipment;Total assistance General ADL Comments: pt requires hand over hand support to engage in ADL tasks,  maximal redirection required with L hand to ensure not pulling at lines or pushing      Vision   Additional Comments: L gaze preference sitting EOB, automatically looking out window to L upon exit    Perception     Praxis      Cognition Arousal/Alertness: Lethargic Behavior During Therapy: Flat affect Overall Cognitive Status: Impaired/Different from baseline Area of Impairment: Attention;Following commands;Problem solving                   Current Attention Level: Focused    Following Commands: Follows one step commands inconsistently;Follows one step commands with increased time     Problem Solving: Slow processing;Decreased initiation General Comments: pt opening eyes and attempts scanning towards L side, follows commands intermittently with L side (LE >UE), requires increased time and multimodal cueing         Exercises     Shoulder Instructions       General Comments pt on RA, VSS; PROM to R UE from shoulder to hand provided slight increase in hand tone noted after PROM      Pertinent Vitals/ Pain       Pain Assessment: Faces Faces Pain Scale: No hurt  Home Living                                          Prior Functioning/Environment              Frequency  Min 2X/week        Progress Toward Goals  OT Goals(current goals can now be found in the care plan section)  Progress towards OT goals: Progressing toward goals  Acute Rehab OT Goals Patient Stated Goal: patient unable to state, no family present OT Goal Formulation: Patient unable to participate in goal setting  Plan Discharge plan remains appropriate;Frequency remains appropriate    Co-evaluation    PT/OT/SLP Co-Evaluation/Treatment: Yes Reason for Co-Treatment: Necessary to address cognition/behavior during functional activity;For patient/therapist safety;To address functional/ADL transfers;Complexity of the patient's impairments (multi-system involvement)   OT goals addressed during session: ADL's and self-care      AM-PAC OT "6 Clicks" Daily Activity     Outcome Measure   Help from another person eating meals?: Total Help from another person taking care of personal grooming?: Total Help from another person toileting, which includes using toliet, bedpan, or urinal?: Total Help from another person bathing (including washing, rinsing, drying)?: Total Help from another person to put on and taking off regular upper body clothing?: Total Help from  another person to put on and taking off regular lower body clothing?: Total 6 Click Score: 6    End of Session    OT Visit Diagnosis: Hemiplegia and hemiparesis Hemiplegia - Right/Left: Right Hemiplegia - dominant/non-dominant: Dominant Hemiplegia - caused by: Cerebral infarction   Activity Tolerance Patient tolerated treatment well   Patient Left in bed;with call bell/phone within reach;with bed alarm set;with SCD's reapplied;with restraints reapplied   Nurse Communication Mobility status        Time: 7824-2353 OT Time Calculation (min): 23 min  Charges: OT General Charges $OT Visit: 1 Visit OT Treatments $Self Care/Home Management : 8-22 mins  Jolaine Artist, OT Acute Rehabilitation Services Pager [phone] Office [phone]    Delight Stare 08/16/2019, 2:09 PM

## 2019-08-16 NOTE — Progress Notes (Signed)
  Speech Language Pathology Treatment: Dysphagia  Patient Details Name: Malik [name] MRN: [id number] DOB: 11-19-1945 Today's Date: 08/16/2019 Time: 1212-1223 SLP Time Calculation (min) (ACUTE ONLY): 11 min  Assessment / Plan / Recommendation Clinical Impression  Pt is more alert and interactive today compared to previous SLP visits. He tries to speak but it is not comprehensible. He follows a few one-step commands and makes spontaneous efforts to engage in self-feeding. Pt still has signs of dysphagia that include R-sided buccal pocketing and mild anterior spillage, but he has no overt s/s of aspiration. Recommend proceeding with MBS to better evaluate oropharyngeal swallow and potential to begin PO diet.    HPI HPI: Pt is a 74 yo male presenting with acute onset R weakness and difficulty speaking s/p tPA and L MCA thrombectomy 6/1. CT Head 6/2 showed significant progression of L MCA infarct with hypodensity in the L middle frontal gyrus extending into the L parietal lobe and L occipital lobe. BSE in 2019 recommended NPO but he progressed very quickly to regular solids/thin liquids as mentation improved. PMH includes: TBI, sleep apnea, HTN, DM, CHF, HLD, heavy alcohol use      SLP Plan  MBS       Recommendations  Diet recommendations: NPO Medication Administration: Via alternative means                Oral Care Recommendations: Oral care QID Follow up Recommendations: Skilled Nursing facility SLP Visit Diagnosis: Dysphagia, oropharyngeal phase (R13.12) Plan: MBS       GO                 Osie Bond., M.A. [place] Acute Rehabilitation Services Pager [phone] Office [phone]  08/16/2019, 1:19 PM

## 2019-08-16 NOTE — TOC CAGE-AID Note (Signed)
Transition of Care [facility]) - CAGE-AID Screening   Patient Details  Name: Kail Fraley MRN: [id number] Date of Birth: [birth year]  Transition of [facility] CM/SW Contact:    Emeterio Reeve, [place] Phone Number: 08/16/2019, 4:38 PM   Clinical Narrative:  Pt did not participate in assessment. Pt would look at csw and look away.   CAGE-AID Screening: Substance Abuse Screening unable to be completed due to: : Patient Refused               Emeterio Reeve, [place], [place] Social Worker [phone]

## 2019-08-16 NOTE — Progress Notes (Signed)
Physical Therapy Treatment Patient Details Name: Malik [name] MRN: [id number] DOB: 07-Dec-1945 Today's Date: 08/16/2019    History of Present Illness This 74 y.o. male admitted with sudden onset of aphasia, Lt gaze preference, and Rt hemiplegia.   He was found to have Lt MCA infarct with proximal to mid M2/M3 occlusion.  He is s/p tPA and thrombectomy.  Repeast CT showed significant progression of Lt MCA infarct with hypodensity in teh Lt middle frontal gyrus extending into the Lt parietal and Lt occipital lobe.  Linear hyperdensity in the Lt sylvian fissure likely represents small SAH.  Repeat MRI showed large Lt evolving MCA infarct with evidence of hemorrhagic conversion as well as some watershed involvement in the ACA, PCA, and LT ACA.   PMH includes:  TBI, neuropathy, LVH, HTN, Dilated cardiomyopathy, DM, chronic diastolic CHF.  ETOH abuse.     PT Comments    Pt in bed upon arrival of PT/OT, agreeable to PT session with focus on progressing activity tolerance and seated balance. The pt continues to present with deficits in functional R-sided strength, coordination, stability, and cognition that impairs his ability to participate in bed mobility or maintain seated position without significant assistance. The pt was able to complete transfers and sitting EOB, but requires total/maxA due to inability to coordinate motor movements and tendency to push to R despite R-sided weakness. The pt will continue to benefit from skilled PT to further address aforementioned deficits in order to reduce caregiver burden.     Follow Up Recommendations  SNF     Equipment Recommendations  None recommended by PT    Recommendations for Other Services       Precautions / Restrictions Precautions Precautions: Fall Precaution Comments: Lt pusher, cortrak, pt with mitt on RUE Restrictions Weight Bearing Restrictions: No    Mobility  Bed Mobility Overal bed mobility: Needs Assistance Bed Mobility:  Supine to Sit;Sit to Sidelying;Rolling Rolling: Total assist;+2 for physical assistance;+2 for safety/equipment   Supine to sit: Total assist;+2 for physical assistance;+2 for safety/equipment;HOB elevated   Sit to sidelying: Total assist;+2 for physical assistance;+2 for safety/equipment;HOB elevated General bed mobility comments: modified helicopter transition to EOB with total assist +2, returned to sidelying with total assist +2 for trunk and LB support  Transfers                 General transfer comment: unable to safely attempt   Ambulation/Gait                 Stairs             Wheelchair Mobility    Modified Rankin (Stroke Patients Only) Modified Rankin (Stroke Patients Only) Pre-Morbid Rankin Score: No significant disability(unsure of baseline) Modified Rankin: Severe disability     Balance Overall balance assessment: Needs assistance Sitting-balance support: No upper extremity supported;Feet supported Sitting balance-Leahy Scale: Poor Sitting balance - Comments: requires redirection of L hand onto lap otherwise heavy pusher with L UE to R with max assist +2, lateral lean on R forearm fading to min-mod assist  Postural control: Right lateral lean(pushing to R)                                  Cognition Arousal/Alertness: Lethargic Behavior During Therapy: Flat affect Overall Cognitive Status: Impaired/Different from baseline Area of Impairment: Attention;Following commands;Problem solving  Current Attention Level: Focused   Following Commands: Follows one step commands inconsistently;Follows one step commands with increased time     Problem Solving: Slow processing;Decreased initiation General Comments: pt opening eyes and attempts scanning towards L side, follows commands intermittently with L side (LE >UE), requires increased time and multimodal cueing. Able to lift RLE on command x2 through session,  benefits from tactile input and extra time      Exercises      General Comments General comments (skin integrity, edema, etc.): VSS on RA. PROM provided by OT during session to RUE, PROM provided to RLE with increased tone noted with ankle DF      Pertinent Vitals/Pain Pain Assessment: Faces Faces Pain Scale: No hurt Pain Intervention(s): Monitored during session;Repositioned    Home Living                      Prior Function            PT Goals (current goals can now be found in the care plan section) Acute Rehab PT Goals Patient Stated Goal: patient unable to state, no family present PT Goal Formulation: Patient unable to participate in goal setting Time For Goal Achievement: 08/26/19 Potential to Achieve Goals: Fair Progress towards PT goals: Progressing toward goals    Frequency    Min 3X/week      PT Plan Current plan remains appropriate    Co-evaluation PT/OT/SLP Co-Evaluation/Treatment: Yes Reason for Co-Treatment: Necessary to address cognition/behavior during functional activity;For patient/therapist safety;To address functional/ADL transfers;Complexity of the patient's impairments (multi-system involvement) PT goals addressed during session: Mobility/safety with mobility;Balance;Strengthening/ROM OT goals addressed during session: ADL's and self-care      AM-PAC PT "6 Clicks" Mobility   Outcome Measure  Help needed turning from your back to your side while in a flat bed without using bedrails?: Total Help needed moving from lying on your back to sitting on the side of a flat bed without using bedrails?: Total Help needed moving to and from a bed to a chair (including a wheelchair)?: Total Help needed standing up from a chair using your arms (e.g., wheelchair or bedside chair)?: Total Help needed to walk in hospital room?: Total Help needed climbing 3-5 steps with a railing? : Total 6 Click Score: 6    End of Session Equipment Utilized  During Treatment: (pt with mitt on LUE)   Patient left: in bed;with call bell/phone within reach;with bed alarm set Nurse Communication: Mobility status PT Visit Diagnosis: Other abnormalities of gait and mobility (R26.89);Hemiplegia and hemiparesis;Muscle weakness (generalized) (M62.81) Hemiplegia - Right/Left: Right Hemiplegia - dominant/non-dominant: Dominant Hemiplegia - caused by: Cerebral infarction     Time: 4010-2725 PT Time Calculation (min) (ACUTE ONLY): 24 min  Charges:  $Therapeutic Activity: 8-22 mins                     Karma Ganja, PT, DPT   Acute Rehabilitation Department Pager #: [phone]   Otho Bellows 08/16/2019, 2:59 PM

## 2019-08-17 ENCOUNTER — Inpatient Hospital Stay (HOSPITAL_COMMUNITY): Payer: Medicare Other

## 2019-08-17 LAB — BASIC METABOLIC PANEL
Anion gap: 9 (ref 5–15)
BUN: 15 mg/dL (ref 8–23)
CO2: 21 mmol/L — ABNORMAL LOW (ref 22–32)
Calcium: 8.6 mg/dL — ABNORMAL LOW (ref 8.9–10.3)
Chloride: 107 mmol/L (ref 98–111)
Creatinine, Ser: 0.87 mg/dL (ref 0.61–1.24)
GFR calc Af Amer: >60 mL/min (ref >60)
GFR calc non Af Amer: >60 mL/min (ref >60)
Glucose, Bld: 122 mg/dL — ABNORMAL HIGH (ref 70–99)
Potassium: 4.3 mmol/L (ref 3.5–5.1)
Sodium: 137 mmol/L (ref 135–145)

## 2019-08-17 LAB — CBC
HCT: 30.5 % — ABNORMAL LOW (ref 39.0–52.0)
Hemoglobin: 8.8 g/dL — ABNORMAL LOW (ref 13.0–17.0)
MCH: 20.4 pg — ABNORMAL LOW (ref 26.0–34.0)
MCHC: 28.9 g/dL — ABNORMAL LOW (ref 30.0–36.0)
MCV: 70.8 fL — ABNORMAL LOW (ref 80.0–100.0)
Platelets: 418 10*3/uL — ABNORMAL HIGH (ref 150–400)
RBC: 4.31 MIL/uL (ref 4.22–5.81)
RDW: 20.2 % — ABNORMAL HIGH (ref 11.5–15.5)
WBC: 13 10*3/uL — ABNORMAL HIGH (ref 4.0–10.5)
nRBC: 0.2 % (ref 0.0–0.2)

## 2019-08-17 LAB — GLUCOSE, CAPILLARY
Glucose-Capillary: 125 mg/dL — ABNORMAL HIGH (ref 70–99)
Glucose-Capillary: 177 mg/dL — ABNORMAL HIGH (ref 70–99)
Glucose-Capillary: 112 mg/dL — ABNORMAL HIGH (ref 70–99)
Glucose-Capillary: 160 mg/dL — ABNORMAL HIGH (ref 70–99)
Glucose-Capillary: 113 mg/dL — ABNORMAL HIGH (ref 70–99)

## 2019-08-17 MED ORDER — RESOURCE THICKENUP CLEAR PO POWD
ORAL | Status: DC | PRN
  Filled 2019-08-17: qty 125

## 2019-08-17 MED ORDER — PANTOPRAZOLE SODIUM 40 MG PO PACK
40.0000 mg | PACK | Freq: Every day | ORAL | Status: DC
  Administered 2019-08-17 – 2019-08-22 (×6): 40 mg
  Filled 2019-08-17 (×6): qty 20

## 2019-08-17 NOTE — Progress Notes (Signed)
Modified Barium Swallow Progress Note  Patient Details  Name: Malik [name] MRN: [id number] Date of Birth: 09-24-1945  Today's Date: 08/17/2019  Modified Barium Swallow completed.  Full report located under Chart Review in the Imaging Section.  Brief recommendations include the following:  Clinical Impression  Pt presents with a moderate oral and more mild pharyngeal dysphagia. Of note, he had facial grimacing and was resistent to intake during study, suspect related to the taste of barium although difficult to determine with certainty given communication deficits. He had more oral holding than was observed at bedside. Significant amounts of R-sided anterior spillage was noted with liquids via spoon and cup but he could not suck on the straw to get any liquid today. There is also posterior spillage into the pharynx, reaching the valleculae primarily but at times the pyriform sinuses. Anterior and posterior spillage is reduced with nectar thick liquids. Thin liquids by cup are aspirated before the swallow with a spontaneous cough reflex triggered. Most of the thin liquids from a spoon were lost anteriorly. Pt had improved airway protection before and during the swallow with nectar thick liquids, with penetration x1 after the swallow with oral residue from a cup sip, although question if he could have also aspirated some secretions. SLP plans to trial nectar thick liquids and purees at bedside to see if he can be further challenged when he does not seem to have such a taste aversion. If there are no clinical s/s of aspiration as were elicited during aspiration on MBS, could start a diet of these consistencies.    Swallow Evaluation Recommendations       SLP Diet Recommendations: NPO (pending additional trials with SLP)       Medication Administration: Via alternative means       Oral Care Recommendations: Oral care QID   Other Recommendations: Have oral suction available     Osie Bond., M.A. [place] Pager [phone] Office [phone]  08/17/2019,9:38 AM

## 2019-08-17 NOTE — Progress Notes (Signed)
PHARMACIST - PHYSICIAN COMMUNICATION  DR:   Leonie Man  CONCERNING: IV to Oral Route Change Policy  RECOMMENDATION: This patient is receiving pantoprazole by the intravenous route.  Based on criteria approved by the Pharmacy and Therapeutics Committee, the intravenous medication(s) is/are being converted to the equivalent oral dose form(s).   DESCRIPTION: These criteria include:  The patient is eating (either orally or via tube) and/or has been taking other orally administered medications for a least 24 hours  The patient has no evidence of active gastrointestinal bleeding or impaired GI absorption (gastrectomy, short bowel, patient on TNA or NPO).  If you have questions about this conversion, please contact the Pharmacy Department  []   [phone] )  [facility] []   [phone] )  [facility] [x]   [phone] )  [facility] []   [phone] )  [facility] []   [phone] )  [place], [place] 08/17/2019 12:50 PM

## 2019-08-17 NOTE — Progress Notes (Signed)
  Speech Language Pathology Treatment: Dysphagia  Patient Details Name: Malik [name] MRN: [id number] DOB: [birth year] Today's Date: 08/17/2019 Time: [id number] SLP Time Calculation (min) (ACUTE ONLY): 12 min  Assessment / Plan / Recommendation Clinical Impression  Pt was seen for f/u diagnostic trials after MBS in order to provide additional challenging. Pt did still have prolonged oral transit with purees but much improved timing from presentation of nectar thick liquids via spoon until hyolaryngeal movement is noted to palpation. He does still have difficulty getting liquids in via cup due to anterior loss and SLP suctioned mild R buccal pocketing of purees. No overt s/s of aspiration noted. SLP recommended starting Dys 1 diet and nectar thick liquids via spoon only under full supervision. After discussing with NP, diet is on hold pending results of lab work and CXR. Will f/u for potential to start diet and additional swallowing/communciation therapy.    HPI HPI: Pt is a 74 yo male presenting with acute onset R weakness and difficulty speaking s/p tPA and L MCA thrombectomy 6/1. CT Head 6/2 showed significant progression of L MCA infarct with hypodensity in the L middle frontal gyrus extending into the L parietal lobe and L occipital lobe. BSE in 2019 recommended NPO but he progressed very quickly to regular solids/thin liquids as mentation improved. PMH includes: TBI, sleep apnea, HTN, DM, CHF, HLD, heavy alcohol use      SLP Plan  Continue with current plan of care       Recommendations  Diet recommendations: Dysphagia 1 (puree);Nectar-thick liquid Liquids provided via: Teaspoon Medication Administration: Crushed with puree Supervision: Staff to assist with self feeding;Full supervision/cueing for compensatory strategies Compensations: Minimize environmental distractions;Slow rate;Small sips/bites;Monitor for anterior loss;Other (Comment)(suction oral residue PRN) Postural Changes  and/or Swallow Maneuvers: Seated upright 90 degrees;Upright 30-60 min after meal                Oral Care Recommendations: Oral care BID;Oral care before and after PO Follow up Recommendations: Skilled Nursing facility SLP Visit Diagnosis: Dysphagia, oropharyngeal phase (R13.12) Plan: Continue with current plan of care       GO                 Osie Bond., M.A. [place] [facility] Pager [phone] Office [phone]  08/17/2019, 10:10 AM
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
## 2019-08-17 NOTE — Progress Notes (Signed)
STROKE TEAM PROGRESS NOTE   INTERVAL HISTORY   No family member at bedside. Pt patient is more drowsy but can be aroused, mumbling words, difficult to understand left forced gaze improved but still not cross midline. this morning no fever, BP on the high side.  No neurological changes.  Speech therapy recommend n.p.o. status and will need PEG tube Vitals:   08/16/19 2346 08/17/19 0404 08/17/19 0500 08/17/19 0732  BP: (!) 167/90 (!) 142/66  (!) 161/88  Pulse: 84 79  79  Resp: (!) 21 19  20   Temp: 99.4 F (37.4 C) 99.5 F (37.5 C)  97.8 F (36.6 C)  TempSrc: Oral Oral  Oral  SpO2: 97% 98%  100%  Weight:   96.7 kg   Height:       CBC:  Recent Labs  Lab 08/10/19 1730 08/10/19 1732 08/14/19 0623 08/15/19 0545  WBC 12.2*   < > 14.1* 13.4*  NEUTROABS 6.1  --   --   --   HGB 8.9*   < > 9.0* 8.9*  HCT 30.5*   < > 31.1* 30.6*  MCV 70.1*   < > 70.2* 70.7*  PLT 466*   < > 418* 412*   < > = values in this interval not displayed.   Basic Metabolic Panel:  Recent Labs  Lab 08/14/19 0623 08/14/19 1642 08/15/19 0545  NA 137  --  138  K 3.5  --  3.9  CL 107  --  109  CO2 21*  --  21*  GLUCOSE 192*  --  210*  BUN 18  --  14  CREATININE 1.29*  --  1.12  CALCIUM 8.3*  --  8.2*  MG 2.0 2.1  --   PHOS 3.1 3.0  --    Lipid Panel:     Component Value Date/Time   CHOL 189 08/11/2019 0625   TRIG 64 08/11/2019 0625   HDL 49 08/11/2019 0625   CHOLHDL 3.9 08/11/2019 0625   VLDL 13 08/11/2019 0625   LDLCALC 127 (H) 08/11/2019 0625   HgbA1c:  Lab Results  Component Value Date   HGBA1C 5.4 08/11/2019   Urine Drug Screen:     Component Value Date/Time   LABOPIA NONE DETECTED 08/11/2019 0343   COCAINSCRNUR NONE DETECTED 08/11/2019 0343   LABBENZ NONE DETECTED 08/11/2019 0343   AMPHETMU NONE DETECTED 08/11/2019 0343   THCU NONE DETECTED 08/11/2019 0343   LABBARB NONE DETECTED 08/11/2019 0343    Alcohol Level     Component Value Date/Time   ETH 46 (H) 08/10/2019 1730     IMAGING past 24 hours  DG Swallowing Func-Speech Pathology  Result Date: 08/17/2019 Objective Swallowing Evaluation: Type of Study: MBS-Modified Barium Swallow Study  Patient Details Name: Loui Massenburg MRN: [id number] Date of Birth: 1945/11/28 Today's Date: 08/17/2019 Time: SLP Start Time (ACUTE ONLY): 0827 -SLP Stop Time (ACUTE ONLY): 0853 SLP Time Calculation (min) (ACUTE ONLY): 26 min Past Medical History: Past Medical History: Diagnosis Date . Angina of effort ([place])  . Chronic diastolic CHF (congestive heart failure) ([place])  . Diabetes ([place])  . Diabetes mellitus without complication ([place])  . Dilated cardiomyopathy ([place])  . Heart disease  . High blood pressure  . Hypertension  . LVH (left ventricular hypertrophy) due to hypertensive disease  . Moderate mitral insufficiency  . Neuropathy   with LE weakness . Sleep apnea  . TBI (traumatic brain injury) [facility])  Past Surgical History: Past Surgical History: Procedure Laterality Date . BACK SURGERY   .  IR CT HEAD LTD  08/10/2019 . IR PERCUTANEOUS ART THROMBECTOMY/INFUSION INTRACRANIAL INC DIAG ANGIO  08/10/2019 . RADIOLOGY WITH ANESTHESIA N/A 08/10/2019  Procedure: IR WITH ANESTHESIA -CODE STROKE;  Surgeon: Radiologist, Medication, MD;  Location: [place];  Service: Radiology;  Laterality: N/A; . 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 CT Head 6/2 showed significant progression of L MCA infarct with hypodensity in the L middle frontal gyrus extending into the L parietal lobe and L occipital lobe. BSE in 2019 recommended NPO but he progressed very quickly to regular solids/thin liquids as mentation improved. PMH includes: TBI, sleep apnea, HTN, DM, CHF, HLD, heavy alcohol use  Subjective: pt alert, aphasic Assessment /  Plan / Recommendation CHL IP CLINICAL IMPRESSIONS 08/17/2019 Clinical Impression Pt presents with a moderate oral and more mild pharyngeal dysphagia. Of note, he had facial grimacing and was resistent to intake during study, suspect related to the taste of barium although difficult to determine with certainty given communication deficits. He had more oral holding than was observed at bedside. Significant amounts of R-sided anterior spillage was noted with liquids via spoon and cup but he could not suck on the straw to get any liquid today. There is also posterior spillage into the pharynx, reaching the valleculae primarily but at times the pyriform sinuses. Anterior and posterior spillage is reduced with nectar thick liquids. Thin liquids by cup are aspirated before the swallow with a spontaneous cough reflex triggered. Most of the thin liquids from a spoon were lost anteriorly. Pt had improved airway protection before and during the swallow with nectar thick liquids, with penetration x1 after the swallow with oral residue from a cup sip, although question if he could have also aspirated some secretions. SLP plans to trial nectar thick liquids and purees at bedside to see if he can be further challenged when he does not seem to have such a taste aversion. If there are no clinical s/s of aspiration as were elicited during aspiration on MBS, could start a diet of these consistencies.  SLP Visit Diagnosis Dysphagia, oropharyngeal phase (R13.12) Attention and concentration deficit following -- Frontal lobe and executive function deficit following -- Impact on safety and function Moderate aspiration risk   CHL IP TREATMENT RECOMMENDATION 08/17/2019 Treatment Recommendations Therapy as outlined in treatment plan below   Prognosis 08/17/2019 Prognosis for Safe Diet Advancement Good Barriers to Reach Goals Language deficits Barriers/Prognosis Comment -- CHL IP DIET RECOMMENDATION 08/17/2019 SLP Diet Recommendations NPO Liquid  Administration via -- Medication Administration Via alternative means Compensations -- Postural Changes --   CHL IP OTHER RECOMMENDATIONS 08/17/2019 Recommended Consults -- Oral Care Recommendations Oral care QID Other Recommendations Have oral suction available   CHL IP FOLLOW UP RECOMMENDATIONS 08/17/2019 Follow up Recommendations Skilled Nursing facility   [facility] IP FREQUENCY AND DURATION 08/17/2019 Speech Therapy Frequency (ACUTE ONLY) min 2x/week Treatment Duration 2 weeks      CHL IP ORAL PHASE 08/17/2019 Oral Phase Impaired Oral - Pudding Teaspoon -- Oral - Pudding Cup -- Oral - Honey Teaspoon -- Oral - Honey Cup -- Oral - Nectar Teaspoon Right anterior bolus loss;Holding of bolus;Right pocketing in lateral sulci;Lingual/palatal residue;Delayed oral transit;Premature spillage Oral - Nectar Cup Right anterior bolus loss;Holding of bolus;Right pocketing in lateral sulci;Lingual/palatal  residue;Delayed oral transit;Premature spillage Oral - Nectar Straw -- Oral - Thin Teaspoon Right anterior bolus loss;Holding of bolus;Right pocketing in lateral sulci;Lingual/palatal residue;Delayed oral transit;Premature spillage Oral - Thin Cup Right anterior bolus loss;Holding of bolus;Right pocketing in lateral sulci;Lingual/palatal residue;Delayed oral transit;Premature spillage Oral - Thin Straw -- Oral - Puree Right anterior bolus loss;Holding of bolus;Right pocketing in lateral sulci;Lingual/palatal residue;Delayed oral transit Oral - Mech Soft -- Oral - Regular -- Oral - Multi-Consistency -- Oral - Pill -- Oral Phase - Comment --  CHL IP PHARYNGEAL PHASE 08/17/2019 Pharyngeal Phase Impaired Pharyngeal- Pudding Teaspoon -- Pharyngeal -- Pharyngeal- Pudding Cup -- Pharyngeal -- Pharyngeal- Honey Teaspoon -- Pharyngeal -- Pharyngeal- Honey Cup -- Pharyngeal -- Pharyngeal- Nectar Teaspoon WFL Pharyngeal -- Pharyngeal- Nectar Cup Penetration/Apiration after swallow Pharyngeal Material enters airway, remains ABOVE vocal cords then ejected  out Pharyngeal- Nectar Straw -- Pharyngeal -- Pharyngeal- Thin Teaspoon WFL Pharyngeal -- Pharyngeal- Thin Cup Penetration/Aspiration before swallow Pharyngeal Material enters airway, passes BELOW cords and not ejected out despite cough attempt by patient Pharyngeal- Thin Straw -- Pharyngeal -- Pharyngeal- Puree WFL Pharyngeal -- Pharyngeal- Mechanical Soft -- Pharyngeal -- Pharyngeal- Regular -- Pharyngeal -- Pharyngeal- Multi-consistency -- Pharyngeal -- Pharyngeal- Pill -- Pharyngeal -- Pharyngeal Comment --  CHL IP CERVICAL ESOPHAGEAL PHASE 08/17/2019 Cervical Esophageal Phase WFL Pudding Teaspoon -- Pudding Cup -- Honey Teaspoon -- Honey Cup -- Nectar Teaspoon -- Nectar Cup -- Nectar Straw -- Thin Teaspoon -- Thin Cup -- Thin Straw -- Puree -- Mechanical Soft -- Regular -- Multi-consistency -- Pill -- Cervical Esophageal Comment -- Osie Bond., M.A. CCC-SLP Acute Rehabilitation Services Pager [phone] Office [phone] 08/17/2019, 9:50 AM               PHYSICAL EXAM    Temp:  [97.8 F (36.6 C)-99.8 F (37.7 C)] 97.8 F (36.6 C) (06/08 0732) Pulse Rate:  [79-93] 79 (06/08 0732) Resp:  [15-21] 20 (06/08 0732) BP: (142-167)/(66-90) 161/88 (06/08 0732) SpO2:  [94 %-100 %] 100 % (06/08 0732) Weight:  [96.7 kg] 96.7 kg (06/08 0500)  General - well nourished, well developed elderly African-American male, in no apparent distress.    Ophthalmologic - fundi not visualized due to noncooperation.    Cardiovascular - regular rhythm and rate   Neuro - awake, eyes open, but global aphasia, not following commands, mumbling words but intangible. Eyes left gaze preference, barely cross midline. Mild right nasolabial fold flattening. Not blinking to visual threat on the right, but able to blink to visual threat on the left. LUE and LLE spontaneous movement against gravity, but RUE and RLE flaccid. Sensation, coordination and gait not tested.  ASSESSMENT/PLAN Mr. Even Budlong is a 75 y.o. male with  history of HTN, DM, HLD, heavy alcohol who had sudden onset aphasia, L gaze preference and R hemiplegia. EKG showed AF en route. High BP treated with labetalol. Received tPA 08/10/2019 at 1749.  Stroke:   L MCA infarct s/p tPA and attempted IR L Q5/Z5 occlusion, embolic secondary to unknown source   Code Stroke CT head No acute abnormality. ASPECTS 10.     CTA head proximal to mid L M2 occlusion. Mild B ICA atherosclerosis.  CTA neck B ICA bifurcation and bulbs atherosclerosis. L VA origin atherosclerosis. 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 [place] recommended  Cerebral angio L M3 superior branch occlusion w/ subocclusive filling defect.    Post IR CT Minimal contrast extravasation L M3 branch  CT 6/2 progression of L MCA infarct, hypodensity  L middle frontal gyrus extending into L parietal and L occipital lobe L sylvian fissure SAH.  MRI  Large evolving L MCA infarct w/ hemorrhagic transformation w/o discrete hematoma. Also L ACA infarct and ACA/PCA watershed territory. No significant mass effect.   MRA  Motion. Proximal L M2 branch occlusion   2D Echo EF 50-55%. No source of embolus. B atrial mildly dilated.  LE venous doppler no DVT  LDL 127  HgbA1c 5.4  SCDs for VTE prophylaxis  No antithrombotic prior to admission, on ASA 325. Not on DAPT due to possible PEG placement.    Therapy recommendations:  SNF recommended  Disposition:  pending   Hypertensive Emergency  SBP 211/133 on arrival  Home meds:  norvasc 10, lasix 20, hydralazine 100 q8, metoprolol 25 bid (pt not taking)  Labetalol prior to tPA  Off cleviprex gtt    Stable on the high end . SBP goal < 180 . Resume metoprolol 25 bid and amlodipine 10 . Long-term BP goal 130-150 given left M2 occlusion  Hyperlipidemia  Home meds:  lipitor 40  LDL 127, goal < 70  On lipitor 80   Resume statin at discharge   Diabetes type II, hyperglycemia   Home meds:  Glipizide 10, metformin 500  bid  HgbA1c 5.4, goal < 7.0  CBGs   SSI  PCP follow up  ?? Afib  EMS ECG showed probably afib  So far tele in hospital not in afib  Pt will need loop recorder to rule out afib if his neuro status improves  Dysphagia . Secondary to stroke . NPO . Speech on board . S/p cortrak  . On TF @ 50 and IVF @ 50 and add FW 200 Q4 . Did not pass MBSS - trauma consulted for PEG    Fever Leukocytosis   Fever 100.7->101.3  WBC 12.2 ->11.7->14.5->17.2->14.1-13.2  UA large LE, 21-50 WBC, few bacteria.  UCx neg  CXR - Cardiomegaly. Bibasilar atelectasis. Infection not excluded.  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 [place] recommended  Hyperkalemia 3.3->5.8->4.9 - resolved ->3.5  Hospital day # 7  Continue ongoing medical management.    he will   need to discuss PEG tube placement and will consult trauma team.  Check chest x-ray and CBC and CMP.  Greater than 50% time during this 25-minute visit was spent on counseling and coordination of care and answering questions.  Discussed with trauma team Antony Contras, MD.   To contact Stroke Continuity provider, please refer to [email or web address] After hours, contact General Neurology

## 2019-08-18 LAB — GLUCOSE, CAPILLARY
Glucose-Capillary: 103 mg/dL — ABNORMAL HIGH (ref 70–99)
Glucose-Capillary: 146 mg/dL — ABNORMAL HIGH (ref 70–99)
Glucose-Capillary: 160 mg/dL — ABNORMAL HIGH (ref 70–99)
Glucose-Capillary: 160 mg/dL — ABNORMAL HIGH (ref 70–99)
Glucose-Capillary: 173 mg/dL — ABNORMAL HIGH (ref 70–99)
Glucose-Capillary: 214 mg/dL — ABNORMAL HIGH (ref 70–99)

## 2019-08-18 MED ORDER — PRO-STAT SUGAR FREE PO LIQD
30.0000 mL | Freq: Two times a day (BID) | ORAL | Status: DC
  Administered 2019-08-18 – 2019-08-20 (×4): 30 mL
  Filled 2019-08-18 (×4): qty 30

## 2019-08-18 MED ORDER — OSMOLITE 1.5 CAL PO LIQD
650.0000 mL | ORAL | Status: DC
  Administered 2019-08-19: 650 mL
  Filled 2019-08-18 (×3): qty 711

## 2019-08-18 NOTE — Progress Notes (Signed)
STROKE TEAM PROGRESS NOTE   INTERVAL HISTORY   No family member at bedside. Pt patient is alert and interactive today.  He is now cleared for dysphagia 1 diet and is being fed.  Mumbling words, difficult to understand left forced gaze improved but still not cross midline. this morning no fever, chest x-ray yesterday showed bibasilar atelectasis but no clear infiltrate or pneumonia.  CBC was slightly elevated at 13.0.  He remains afebrile. No neurological changes.  Speech therapy recommend dysphagia 1 diet but will have to monitor his intakeTo see if he can maintain his nutrition.  Trauma team has been consulted for PEG and will follow his progress over the next 2 days to make a final decision Vitals:   08/17/19 2335 08/18/19 0344 08/18/19 0721 08/18/19 1158  BP: (!) 183/91 (!) 171/80 (!) 164/94 (!) 153/76  Pulse: 82 80 78 79  Resp: 19 17 18 18   Temp: 98.3 F (36.8 C) 99.3 F (37.4 C) 98.4 F (36.9 C) 98.6 F (37 C)  TempSrc: Oral Oral Oral Oral  SpO2: 96% 97% 94% 99%  Weight:      Height:       CBC:  Recent Labs  Lab 08/15/19 0545 08/17/19 1149  WBC 13.4* 13.0*  HGB 8.9* 8.8*  HCT 30.6* 30.5*  MCV 70.7* 70.8*  PLT 412* 161*   Basic Metabolic Panel:  Recent Labs  Lab 08/14/19 0623 08/14/19 0623 08/14/19 1642 08/15/19 0545 08/17/19 1149  NA 137   < >  --  138 137  K 3.5   < >  --  3.9 4.3  CL 107   < >  --  109 107  CO2 21*   < >  --  21* 21*  GLUCOSE 192*   < >  --  210* 122*  BUN 18   < >  --  14 15  CREATININE 1.29*   < >  --  1.12 0.87  CALCIUM 8.3*   < >  --  8.2* 8.6*  MG 2.0  --  2.1  --   --   PHOS 3.1  --  3.0  --   --    < > = values in this interval not displayed.   Lipid Panel:     Component Value Date/Time   CHOL 189 08/11/2019 0625   TRIG 64 08/11/2019 0625   HDL 49 08/11/2019 0625   CHOLHDL 3.9 08/11/2019 0625   VLDL 13 08/11/2019 0625   LDLCALC 127 (H) 08/11/2019 0625   HgbA1c:  Lab Results  Component Value Date   HGBA1C 5.4 08/11/2019    Urine Drug Screen:     Component Value Date/Time   LABOPIA NONE DETECTED 08/11/2019 0343   COCAINSCRNUR NONE DETECTED 08/11/2019 0343   LABBENZ NONE DETECTED 08/11/2019 0343   AMPHETMU NONE DETECTED 08/11/2019 0343   THCU NONE DETECTED 08/11/2019 0343   LABBARB NONE DETECTED 08/11/2019 0343    Alcohol Level     Component Value Date/Time   ETH 46 (H) 08/10/2019 1730    IMAGING past 24 hours  No results found.  PHYSICAL EXAM    Temp:  [98.3 F (36.8 C)-99.3 F (37.4 C)] 98.6 F (37 C) (06/09 1158) Pulse Rate:  [78-85] 79 (06/09 1158) Resp:  [17-19] 18 (06/09 1158) BP: (139-183)/(76-94) 153/76 (06/09 1158) SpO2:  [94 %-100 %] 99 % (06/09 1158)  General - well nourished, well developed elderly African-American male, in no apparent distress.    Ophthalmologic - fundi  not visualized due to noncooperation.    Cardiovascular - regular rhythm and rate   Neuro - awake, eyes open, but global aphasia, not following commands, mumbling words but intangible. Eyes left gaze preference, barely cross midline. Mild right nasolabial fold flattening. Not blinking to visual threat on the right, but able to blink to visual threat on the left. LUE and LLE spontaneous movement against gravity, but RUE and RLE flaccid. Sensation, coordination and gait not tested.  ASSESSMENT/PLAN Malik [name] is a 74 y.o. male with history of HTN, DM, HLD, heavy alcohol who had sudden onset aphasia, L gaze preference and R hemiplegia. EKG showed AF en route. High BP treated with labetalol. Received tPA 08/10/2019 at 1749.  Stroke:   L MCA infarct s/p tPA and attempted IR L V4/M0 occlusion, embolic secondary to unknown source   Code Stroke CT head No acute abnormality. ASPECTS 10.     CTA head proximal to mid L M2 occlusion. Mild B ICA atherosclerosis.  CTA neck B ICA bifurcation and bulbs atherosclerosis. L VA origin atherosclerosis. 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 [place]  recommended  Cerebral angio L M3 superior branch occlusion w/ subocclusive filling defect.    Post IR CT Minimal contrast extravasation L M3 branch  CT 6/2 progression of L MCA infarct, hypodensity L middle frontal gyrus extending into L parietal and L occipital lobe L sylvian fissure SAH.  MRI  Large evolving L MCA infarct w/ hemorrhagic transformation w/o discrete hematoma. Also L ACA infarct and ACA/PCA watershed territory. No significant mass effect.   MRA  Motion. Proximal L M2 branch occlusion   2D Echo EF 50-55%. No source of embolus. B atrial mildly dilated.  LE venous doppler no DVT  LDL 127  HgbA1c 5.4  SCDs for VTE prophylaxis  No antithrombotic prior to admission, on ASA 325. Not on DAPT due to possible PEG placement.    Therapy recommendations:  SNF recommended  Disposition:  pending   Hypertensive Emergency  SBP 211/133 on arrival  Home meds:  norvasc 10, lasix 20, hydralazine 100 q8, metoprolol 25 bid (pt not taking)  Labetalol prior to tPA  Off cleviprex gtt    Stable on the high end . SBP goal < 180 . Resume metoprolol 25 bid and amlodipine 10 . Long-term BP goal 130-150 given left M2 occlusion  Hyperlipidemia  Home meds:  lipitor 40  LDL 127, goal < 70  On lipitor 80   Resume statin at discharge   Diabetes type II, hyperglycemia   Home meds:  Glipizide 10, metformin 500 bid  HgbA1c 5.4, goal < 7.0  CBGs   SSI  PCP follow up  ?? Afib  EMS ECG showed probably afib  So far tele in hospital not in afib  Pt will need loop recorder to rule out afib if his neuro status improves  Dysphagia . Secondary to stroke . NPO . Speech on board . S/p cortrak  . On TF @ 50 and IVF @ 50 and add FW 200 Q4 . Did   pass MBSS on dysphagia 1 diet but will monitor his oral intake to see if he can maintain his nutrition- trauma consulted for PEG if needed   Fever Leukocytosis   Fever 100.7->101.3  WBC 12.2  ->11.7->14.5->17.2->14.1-13.2  UA large LE, 21-50 WBC, few bacteria.  UCx neg  CXR - Cardiomegaly. Bibasilar atelectasis. Infection not excluded.  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 [place] recommended  Hyperkalemia 3.3->5.8->4.9 - resolved ->3.5  Hospital day # 8  Continue ongoing medical management.  Encourage dysphagia 1 diet oral intake and monitor it carefully over the next few days if he is unable to meet his nutritional needs will need PEG tube    Greater than 50% time during this 25-minute visit was spent on counseling and coordination of care and answering questions.  Discussed with trauma team Antony Contras, MD.   To contact Stroke Continuity provider, please refer to [email or web address] After hours, contact General Neurology

## 2019-08-18 NOTE — Progress Notes (Signed)
Nutrition Follow-up  DOCUMENTATION CODES:   Not applicable  INTERVENTION:  Calorie count per MD  Feeding assistance with all meals  Vital Cuisine shakes po TID, each supplement provides 500 kcal and 22 grams of protein  Transition to nocturnal tube feeding via Cortrak: -Osmolite 1.5 cal @ 96ml/hr for 10 hours (between 2000-0600) -44ml Pro-stat BID -260ml free water Q4H  Nocturnal tube feeding regimen will provide 1175 kcal (meets 53% estimated needs), 70 grams of protein, 437ml free water (1658ml total free water with flushes)   NUTRITION DIAGNOSIS:   Inadequate oral intake related to inability to eat as evidenced by NPO status.  Progressing, pt now on Dysphagia 1 diet with Nectar thick liquids  GOAL:   Patient will meet greater than or equal to 90% of their needs  Progressing.  MONITOR:   Diet advancement, TF tolerance  REASON FOR ASSESSMENT:   Consult Enteral/tube feeding initiation and management  ASSESSMENT:   Pt with PMH of HTN, DM, HLD, CHF, and heavy ETOH use admitted with L large MCA infarct s/p tPA and IR for revascularization.  6/2 failed swallow eval  6/3 per MD Large evolving L MCA infarct w/ hemorrhagic transformation. Also L ACA infarct and ACA/PCA watershed territory. 6/4 Cortrak placed, tip in stomach 6/8 dysphagia 1 diet with nectar thick liquids  Pt noted to make unintelligible attempts at communication, but is mostly nonverbal. Discussed pt with RN. Per MD, although pt has passed for D1 nectar thick liquids, he will likely still need a PEG tube. Tentative plans for placement on 6/10 or 6/11. Pt will need feeding assistance.   TF via Cortrak: Osmolite 1.5 cal @ 1ml/hr, 43ml Pro-stat TID, 235ml free water Q4H  UOP: 73ml x24 hours I/O: +6,848.60ml since admit  Labs reviewed. CBGs 160-146-160 Medications: folvite, novolog, liquid MVI, thiamine  Diet Order:   Diet Order            DIET - DYS 1 Room service appropriate? No; Fluid  consistency: Nectar Thick  Diet effective now              EDUCATION NEEDS:   No education needs have been identified at this time  Skin:  Skin Assessment: Skin Integrity Issues: Skin Integrity Issues:: Stage II Stage II: penis  Last BM:  6/8 type 7  Height:   Ht Readings from Last 1 Encounters:  08/14/19 5\' 10"  (1.778 m)    Weight:   Wt Readings from Last 1 Encounters:  08/17/19 96.7 kg    BMI:  Body mass index is 30.59 kg/m.  Estimated Nutritional Needs:   Kcal:  2200-2400  Protein:  120-140 grams  Fluid:  2 L/day    Larkin Ina, MS, RD, LDN RD pager number and weekend/on-call pager number located in [place].

## 2019-08-18 NOTE — TOC Initial Note (Signed)
Transition of Care [facility]) - Initial/Assessment Note    Patient Details  Name: Malik [name] MRN: [id number] Date of Birth: January 16, 1946  Transition of Care [facility]) CM/SW Contact:    Pollie Friar, RN Phone Number: 08/18/2019, 2:36 PM  Clinical Narrative:                 Recommendations are for SNF rehab. Pt is unable to communicate verbally currently. CM reached out to his son: Malik [name] and he is in agreement with SNF rehab. He asked that his father be faxed out in the [facility] area.  Awaiting to see if PEG tube to be placed for nutrition.  TOC following.  Expected Discharge Plan: Skilled Nursing Facility Barriers to Discharge: Continued Medical Work up   Patient Goals and CMS Choice   CMS Medicare.gov Compare Post Acute Care list provided to:: Patient Represenative (must comment) Choice offered to / list presented to : Adult Children  Expected Discharge Plan and Services Expected Discharge Plan: [place] In-house Referral: Clinical Social Work Discharge Planning Services: CM Consult Post Acute Care Choice: [place] arrangements for the past 2 months: [place]                                      Prior Living Arrangements/Services Living arrangements for the past 2 months: Single Family Home Lives with:: Self Patient language and need for interpreter reviewed:: Yes        Need for Family Participation in Patient Care: Yes (Comment) Care giver support system in place?: No (comment)   Criminal Activity/Legal Involvement Pertinent to Current Situation/Hospitalization: No - Comment as needed  Activities of Daily Living   ADL Screening (condition at time of admission) Patient's cognitive ability adequate to safely complete daily activities?: No Is the patient deaf or have difficulty hearing?: No Does the patient have difficulty seeing, even when wearing glasses/contacts?: Yes Does the patient have difficulty  concentrating, remembering, or making decisions?: Yes Patient able to express need for assistance with ADLs?: No Does the patient have difficulty dressing or bathing?: Yes Independently performs ADLs?: No Communication: Needs assistance Is this a change from baseline?: Change from baseline, expected to last >3 days Dressing (OT): Needs assistance Is this a change from baseline?: Change from baseline, expected to last >3 days Grooming: Needs assistance Is this a change from baseline?: Change from baseline, expected to last >3 days Feeding: Needs assistance Is this a change from baseline?: Change from baseline, expected to last >3 days Bathing: Needs assistance Is this a change from baseline?: Change from baseline, expected to last >3 days Toileting: Needs assistance Is this a change from baseline?: Change from baseline, expected to last >3days In/Out Bed: Needs assistance Is this a change from baseline?: Change from baseline, expected to last >3 days Walks in Home: Needs assistance Is this a change from baseline?: Change from baseline, expected to last >3 days Does the patient have difficulty walking or climbing stairs?: Yes Weakness of Legs: Right Weakness of Arms/Hands: Right  Permission Sought/Granted                  Emotional Assessment       Orientation: : Oriented to Self   Psych Involvement: No (comment)  Admission diagnosis:  Stroke (cerebrum) ([place]) [A41.6] Embolic stroke ([place]) [S06.3] Cerebrovascular accident (CVA), unspecified mechanism ([place]) [I63.9] Patient Active Problem List   Diagnosis  Date Noted  . Pressure injury of skin 08/13/2019  . Embolic stroke ([place]) 57/90/3833  . Thrombocytosis ([place])   . Acute lower UTI   . Labile blood pressure   . Hydrothorax   . Sleep disturbance   . Pleural effusion on left   . Hypertensive crisis   . Hypertension   . Leukocytosis   . Benign essential HTN   . Diabetes mellitus type 2 in nonobese (HCC)   . Acute blood  loss anemia   . Trauma 05/27/2017  . TBI (traumatic brain injury) ([place])   . Essential hypertension   . Chronic diastolic congestive heart failure ([place])   . 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 Rib fractures 05/10/2017  . 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   PCP:  Donnie Coffin, MD Pharmacy:   [id number] - [place], [place] - [address] [place] [address] Phone: [phone] Fax: [phone]     Social Determinants of Health (SDOH) Interventions    Readmission Risk Interventions No flowsheet data found.

## 2019-08-18 NOTE — Progress Notes (Signed)
  Speech Language Pathology Treatment: Dysphagia  Patient Details Name: Malik [name] MRN: [id number] DOB: [birth year] Today's Date: 08/18/2019 Time: 2778-2423 SLP Time Calculation (min) (ACUTE ONLY): 13 min  Assessment / Plan / Recommendation Clinical Impression  Pt is more alert today and has more complete oral clearance. He ate a full container of applesauce and ~3 ounces of nectar thick liquids by spoon with a single cough. Otherwise, no overt s/s of aspiration were observed. SLP provided intermittent suction to his oral cavity (particularly his R buccal cavity) with minimal food or secretions removed. Would continue with current diet under full supervision to utilize safety precautions.   HPI HPI: Pt is a 74 yo male presenting with acute onset R weakness and difficulty speaking s/p tPA and L MCA thrombectomy 6/1. CT Head 6/2 showed significant progression of L MCA infarct with hypodensity in the L middle frontal gyrus extending into the L parietal lobe and L occipital lobe. BSE in 2019 recommended NPO but he progressed very quickly to regular solids/thin liquids as mentation improved. PMH includes: TBI, sleep apnea, HTN, DM, CHF, HLD, heavy alcohol use      SLP Plan  Continue with current plan of care       Recommendations  Diet recommendations: Dysphagia 1 (puree);Nectar-thick liquid Liquids provided via: Teaspoon Medication Administration: Crushed with puree Supervision: Staff to assist with self feeding;Full supervision/cueing for compensatory strategies Compensations: Minimize environmental distractions;Slow rate;Small sips/bites;Monitor for anterior loss;Other (Comment)(suction oral residue PRN) Postural Changes and/or Swallow Maneuvers: Seated upright 90 degrees;Upright 30-60 min after meal                Oral Care Recommendations: Oral care BID;Oral care before and after PO Follow up Recommendations: Skilled Nursing facility SLP Visit Diagnosis: Dysphagia,  oropharyngeal phase (R13.12) Plan: Continue with current plan of care       GO                 Malik [name]., M.A. [facility] Pager [phone] Office [phone]  08/18/2019, 4:33 PM

## 2019-08-18 NOTE — NC FL2 (Addendum)
[place] MEDICAID FL2 LEVEL OF CARE SCREENING TOOL     IDENTIFICATION  Patient Name: Malik [name] Birthdate: 09-Feb-1946 Sex: male Admission Date (Current Location): 08/10/2019  [facility] and [place] Number:  Engineering geologist and Address:  [facility] [place] [place] [address] [place], [place] [address]      Provider Number: [id number]  Attending Physician Name and Address:  Garvin Fila, MD  Relative Name and Phone Number:       Current Level of Care: Hospital Recommended Level of Care: [place] Prior Approval Number:    Date Approved/Denied:   PASRR Number: [id number]  Discharge Plan: SNF    Current Diagnoses: Patient Active Problem List   Diagnosis Date Noted   Pressure injury of skin 68/01/5725   Embolic stroke ([place]) 20/35/5974   Thrombocytosis ([place])    Acute lower UTI    Labile blood pressure    Hydrothorax    Sleep disturbance    Pleural effusion on left    Hypertensive crisis    Hypertension    Leukocytosis    Benign essential HTN    Diabetes mellitus type 2 in nonobese [facility])    Acute blood loss anemia    Trauma 05/27/2017   TBI (traumatic brain injury) [facility])    Essential hypertension    Chronic diastolic congestive heart failure ([place])    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   Rib fractures 05/10/2017   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    Orientation RESPIRATION BLADDER Height & Weight     Self(follows commands)  Normal Incontinent Weight: 96.7 kg Height:  5\' 10"  (177.8 cm)  BEHAVIORAL SYMPTOMS/MOOD NEUROLOGICAL BOWEL NUTRITION STATUS      Incontinent Diet, Feeding tube(dysphagia 1 with nectar thick--PEG to be placed and will need osmolite 1.5 at 65/hour)  AMBULATORY STATUS COMMUNICATION OF NEEDS Skin   Extensive Assist (follows commands) Other (Comment)(MASD to penis)                       Personal Care Assistance Level of Assistance  Bathing, Feeding, Dressing Bathing Assistance:  Maximum assistance Feeding assistance: Maximum assistance Dressing Assistance: Maximum assistance     Functional Limitations Info  Sight, Hearing, Speech Sight Info: Adequate Hearing Info: Adequate Speech Info: Impaired(mumbles/ dysarthric)    SPECIAL CARE FACTORS FREQUENCY  PT (By licensed PT), OT (By licensed OT), Speech therapy     PT Frequency: 5x/wk OT Frequency: 5x/wk     Speech Therapy Frequency: 5x/wk      Contractures Contractures Info: Not present    Additional Factors Info  Code Status, Allergies, Insulin Sliding Scale Code Status Info: Full Allergies Info: NKA   Insulin Sliding Scale Info: Novolog 0-15 units SQ every 4 hours       Current Medications (08/18/2019):  This is the current hospital active medication list Current Facility-Administered Medications  Medication Dose Route Frequency Provider Last Rate Last Admin    stroke: mapping our early stages of recovery book   Does not apply Once Rosalin Hawking, MD       0.9 %  sodium chloride infusion   Intravenous Continuous Rosalin Hawking, MD 50 mL/hr at 08/18/19 1219 New Bag at 08/18/19 1219   acetaminophen (TYLENOL) tablet 650 mg  650 mg Oral Q4H PRN Rosalin Hawking, MD       Or   acetaminophen (TYLENOL) 160 MG/5ML solution 650 mg  650 mg Per Tube Q4H PRN Rosalin Hawking, MD   [phone]  mg at 08/16/19 1559   Or   acetaminophen (TYLENOL) suppository 650 mg  650 mg Rectal Q4H PRN Rosalin Hawking, MD       amLODipine (NORVASC) tablet 10 mg  10 mg Per Tube Daily Rosalin Hawking, MD   10 mg at 08/18/19 0840   aspirin chewable tablet 324 mg  324 mg Per Tube Daily Rosalin Hawking, MD   324 mg at 08/18/19 0839   atorvastatin (LIPITOR) tablet 80 mg  80 mg Per Tube Daily Rosalin Hawking, MD   80 mg at 08/18/19 0839   chlorhexidine (PERIDEX) 0.12 % solution 15 mL  15 mL Mouth Rinse BID Rosalin Hawking, MD   15 mL at 08/18/19 0840   enoxaparin (LOVENOX) injection 40 mg  40 mg Subcutaneous Q24H Rosalin Hawking, MD   40 mg at 08/18/19 1209   feeding supplement  (OSMOLITE 1.5 CAL) liquid 650 mL  650 mL Per Tube Q24H Garvin Fila, MD       feeding supplement (PRO-STAT SUGAR FREE 64) liquid 30 mL  30 mL Per Tube BID Garvin Fila, MD       folic acid (FOLVITE) tablet 1 mg  1 mg Per Tube Daily Rosalin Hawking, MD   1 mg at 08/18/19 0840   free water 200 mL  200 mL Per Tube Q4H Rosalin Hawking, MD   200 mL at 08/18/19 0841   insulin aspart (novoLOG) injection 0-15 Units  0-15 Units Subcutaneous Q4H Burnetta Sabin L, NP   5 Units at 08/18/19 1223   labetalol (NORMODYNE) injection 5-20 mg  5-20 mg Intravenous Q2H PRN Rosalin Hawking, MD   20 mg at 08/13/19 1259   MEDLINE mouth rinse  15 mL Mouth Rinse BID Aroor, Karena Addison R, MD   15 mL at 08/18/19 0841   metoprolol tartrate (LOPRESSOR) 25 mg/10 mL oral suspension 25 mg  25 mg Per Tube BID Rosalin Hawking, MD   25 mg at 08/18/19 0840   multivitamin liquid 15 mL  15 mL Per Tube Daily Rosalin Hawking, MD   15 mL at 08/18/19 0839   pantoprazole sodium (PROTONIX) 40 mg/20 mL oral suspension 40 mg  40 mg Per Tube QHS Garvin Fila, MD   40 mg at 08/17/19 2219   Resource ThickenUp Clear   Oral PRN Garvin Fila, MD       senna-docusate (Senokot-S) tablet 1 tablet  1 tablet Oral QHS PRN Rosalin Hawking, MD       silver sulfADIAZINE (SILVADENE) 1 % cream   Topical Daily Donzetta Starch, NP   Given at 08/18/19 0841   thiamine tablet 100 mg  100 mg Per Tube Daily Rosalin Hawking, MD   100 mg at 08/18/19 6222     Discharge Medications: Please see discharge summary for a list of discharge medications.  Relevant Imaging Results:  Relevant Lab Results:   Additional Information SS#: [id number]  Pollie Friar, RN   I have personally obtained history,examined this patient, reviewed notes, independently viewed imaging studies, participated in medical decision making and plan of care.ROS completed by me personally and pertinent positives fully documented  I have made any additions or clarifications directly to the above note. Agree with note  above.    Antony Contras, MD Medical Director [facility] Pager: [phone] 08/18/2019 2:48 PM

## 2019-08-19 LAB — GLUCOSE, CAPILLARY
Glucose-Capillary: 100 mg/dL — ABNORMAL HIGH (ref 70–99)
Glucose-Capillary: 106 mg/dL — ABNORMAL HIGH (ref 70–99)
Glucose-Capillary: 122 mg/dL — ABNORMAL HIGH (ref 70–99)
Glucose-Capillary: 152 mg/dL — ABNORMAL HIGH (ref 70–99)
Glucose-Capillary: 153 mg/dL — ABNORMAL HIGH (ref 70–99)
Glucose-Capillary: 173 mg/dL — ABNORMAL HIGH (ref 70–99)
Glucose-Capillary: 95 mg/dL (ref 70–99)

## 2019-08-19 NOTE — Progress Notes (Signed)
STROKE TEAM PROGRESS NOTE   INTERVAL HISTORY   No family member at bedside. Pt patient is alert and interactive today.   He has been eating up to 60-70% of his meals.  No new changes.  Vital signs stable. Vitals:   08/18/19 2339 08/19/19 0359 08/19/19 0738 08/19/19 1145  BP: (!) 152/92 (!) 167/95 (!) 155/81 (!) 154/83  Pulse: 86 84 87 86  Resp: 20 20 20 20   Temp: 98.3 F (36.8 C) 98.6 F (37 C) 99.4 F (37.4 C) 98.3 F (36.8 C)  TempSrc: Oral Oral Oral Oral  SpO2: 97% 96% 96% 96%  Weight:      Height:       CBC:  Recent Labs  Lab 08/15/19 0545 08/17/19 1149  WBC 13.4* 13.0*  HGB 8.9* 8.8*  HCT 30.6* 30.5*  MCV 70.7* 70.8*  PLT 412* 542*   Basic Metabolic Panel:  Recent Labs  Lab 08/14/19 0623 08/14/19 0623 08/14/19 1642 08/15/19 0545 08/17/19 1149  NA 137   < >  --  138 137  K 3.5   < >  --  3.9 4.3  CL 107   < >  --  109 107  CO2 21*   < >  --  21* 21*  GLUCOSE 192*   < >  --  210* 122*  BUN 18   < >  --  14 15  CREATININE 1.29*   < >  --  1.12 0.87  CALCIUM 8.3*   < >  --  8.2* 8.6*  MG 2.0  --  2.1  --   --   PHOS 3.1  --  3.0  --   --    < > = values in this interval not displayed.   Lipid Panel:     Component Value Date/Time   CHOL 189 08/11/2019 0625   TRIG 64 08/11/2019 0625   HDL 49 08/11/2019 0625   CHOLHDL 3.9 08/11/2019 0625   VLDL 13 08/11/2019 0625   LDLCALC 127 (H) 08/11/2019 0625   HgbA1c:  Lab Results  Component Value Date   HGBA1C 5.4 08/11/2019   Urine Drug Screen:     Component Value Date/Time   LABOPIA NONE DETECTED 08/11/2019 0343   COCAINSCRNUR NONE DETECTED 08/11/2019 0343   LABBENZ NONE DETECTED 08/11/2019 0343   AMPHETMU NONE DETECTED 08/11/2019 0343   THCU NONE DETECTED 08/11/2019 0343   LABBARB NONE DETECTED 08/11/2019 0343    Alcohol Level     Component Value Date/Time   ETH 46 (H) 08/10/2019 1730    IMAGING past 24 hours  No results found.  PHYSICAL EXAM    Temp:  [98.3 F (36.8 C)-99.8 F (37.7 C)]  98.3 F (36.8 C) (06/10 1145) Pulse Rate:  [84-92] 86 (06/10 1145) Resp:  [18-20] 20 (06/10 1145) BP: (152-173)/(72-95) 154/83 (06/10 1145) SpO2:  [96 %-98 %] 96 % (06/10 1145)  General - well nourished, well developed elderly African-American male, in no apparent distress.    Ophthalmologic - fundi not visualized due to noncooperation.    Cardiovascular - regular rhythm and rate   Neuro - awake, eyes open, but global aphasia, not following commands, mumbling words but intangible. Eyes left gaze preference, barely cross midline. Mild right nasolabial fold flattening. Not blinking to visual threat on the right, but able to blink to visual threat on the left. LUE and LLE spontaneous movement against gravity, but RUE and RLE flaccid. Sensation, coordination and gait not tested.  ASSESSMENT/PLAN Mr.  Malik [name] is a 74 y.o. male with history of HTN, DM, HLD, heavy alcohol who had sudden onset aphasia, L gaze preference and R hemiplegia. EKG showed AF en route. High BP treated with labetalol. Received tPA 08/10/2019 at 1749.  Stroke:   L MCA infarct s/p tPA and attempted IR L M5/H8 occlusion, embolic secondary to unknown source   Code Stroke CT head No acute abnormality. ASPECTS 10.     CTA head proximal to mid L M2 occlusion. Mild B ICA atherosclerosis.  CTA neck B ICA bifurcation and bulbs atherosclerosis. L VA origin atherosclerosis. 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 [place] recommended  Cerebral angio L M3 superior branch occlusion w/ subocclusive filling defect.    Post IR CT Minimal contrast extravasation L M3 branch  CT 6/2 progression of L MCA infarct, hypodensity L middle frontal gyrus extending into L parietal and L occipital lobe L sylvian fissure SAH.  MRI  Large evolving L MCA infarct w/ hemorrhagic transformation w/o discrete hematoma. Also L ACA infarct and ACA/PCA watershed territory. No significant mass effect.   MRA  Motion. Proximal L M2 branch  occlusion   2D Echo EF 50-55%. No source of embolus. B atrial mildly dilated.  LE venous doppler no DVT  LDL 127  HgbA1c 5.4  SCDs for VTE prophylaxis  No antithrombotic prior to admission, on ASA 325. Not on DAPT due to possible PEG placement.    Therapy recommendations:  SNF recommended  Disposition:  pending   Hypertensive Emergency  SBP 211/133 on arrival  Home meds:  norvasc 10, lasix 20, hydralazine 100 q8, metoprolol 25 bid (pt not taking)  Labetalol prior to tPA  Off cleviprex gtt    Stable on the high end . SBP goal < 180 . Resume metoprolol 25 bid and amlodipine 10 . Long-term BP goal 130-150 given left M2 occlusion  Hyperlipidemia  Home meds:  lipitor 40  LDL 127, goal < 70  On lipitor 80   Resume statin at discharge   Diabetes type II, hyperglycemia   Home meds:  Glipizide 10, metformin 500 bid  HgbA1c 5.4, goal < 7.0  CBGs   SSI  PCP follow up  ?? Afib  EMS ECG showed probably afib  So far tele in hospital not in afib  Pt will need loop recorder to rule out afib if his neuro status improves  Dysphagia . Secondary to stroke . NPO . Speech on board . S/p cortrak  . On TF @ 50 and IVF @ 50 and add FW 200 Q4 . Did   pass MBSS on dysphagia 1 diet but will monitor his oral intake to see if he can maintain his nutrition- trauma consulted for PEG if needed   Fever Leukocytosis   Fever 100.7->101.3  WBC 12.2 ->11.7->14.5->17.2->14.1-13.2  UA large LE, 21-50 WBC, few bacteria.  UCx neg  CXR - Cardiomegaly. Bibasilar atelectasis. Infection not excluded.  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 [place] recommended  Hyperkalemia 3.3->5.8->4.9 - resolved ->3.5  Hospital day # 9  Continue ongoing medical management.  Encourage dysphagia 1 diet oral intake and monitor it carefully and if is eating adequately may consider transfer to skilled nursing facility tomorrow if bed available.  Discussed with patient and Education officer, museum.    Greater than 50% time during this 25-minute visit was spent on counseling and coordination of care and answering questions.    Antony Contras, MD.   To contact Stroke Continuity provider, please refer to [email or web address] After hours, contact General Neurology

## 2019-08-19 NOTE — Progress Notes (Signed)
Physical Therapy Treatment Patient Details Name: Malik [name] MRN: [id number] DOB: 08/03/1945 Today's Date: 08/19/2019    History of Present Illness This 74 y.o. male admitted with sudden onset of aphasia, Lt gaze preference, and Rt hemiplegia.   He was found to have Lt MCA infarct with proximal to mid M2/M3 occlusion.  He is s/p tPA and thrombectomy.  Repeast CT showed significant progression of Lt MCA infarct with hypodensity in teh Lt middle frontal gyrus extending into the Lt parietal and Lt occipital lobe.  Linear hyperdensity in the Lt sylvian fissure likely represents small SAH.  Repeat MRI showed large Lt evolving MCA infarct with evidence of hemorrhagic conversion as well as some watershed involvement in the ACA, PCA, and LT ACA.   PMH includes:  TBI, neuropathy, LVH, HTN, Dilated cardiomyopathy, DM, chronic diastolic CHF.  ETOH abuse.     PT Comments    Patient seen for mobility progression. This session focused on bed mobility and sitting balance EOB. Pt requires max-total A +2 for bed mobility. Continue to progress as tolerated with anticipated d/c to SNF for further skilled PT services.      Follow Up Recommendations  SNF     Equipment Recommendations  None recommended by PT    Recommendations for Other Services       Precautions / Restrictions Precautions Precautions: Fall Precaution Comments: Lt pusher, cortrak Restrictions Weight Bearing Restrictions: No    Mobility  Bed Mobility Overal bed mobility: Needs Assistance Bed Mobility: Supine to Sit;Sit to Sidelying;Rolling Rolling: Mod assist   Supine to sit: +2 for physical assistance;+2 for safety/equipment;HOB elevated;Max assist   Sit to sidelying: Total assist;+2 for physical assistance;+2 for safety/equipment;HOB elevated General bed mobility comments: mod A to roll toward R side with hand over hand assist to reach for rail; assist to bring R LE and hips to EOB and then to elevate trunk into sitting    Transfers                    Ambulation/Gait                 Stairs             Wheelchair Mobility    Modified Rankin (Stroke Patients Only) Modified Rankin (Stroke Patients Only) Pre-Morbid Rankin Score: No significant disability (unsure of baseline) Modified Rankin: Severe disability     Balance Overall balance assessment: Needs assistance Sitting-balance support: No upper extremity supported;Feet supported Sitting balance-Leahy Scale: Poor Sitting balance - Comments: worked on midline posture and lateral pushups; multimodal cues and assist to prevent pushing with L UE  Postural control: Right lateral lean                                  Cognition Arousal/Alertness: Awake/alert Behavior During Therapy: Flat affect Overall Cognitive Status: Impaired/Different from baseline Area of Impairment: Attention;Following commands;Problem solving                   Current Attention Level: Focused   Following Commands: Follows one step commands inconsistently;Follows one step commands with increased time     Problem Solving: Slow processing;Decreased initiation        Exercises      General Comments        Pertinent Vitals/Pain Pain Assessment: Faces Faces Pain Scale: No hurt    Home Living  Prior Function            PT Goals (current goals can now be found in the care plan section) Progress towards PT goals: Progressing toward goals    Frequency    Min 3X/week      PT Plan Current plan remains appropriate    Co-evaluation              AM-PAC PT "6 Clicks" Mobility   Outcome Measure  Help needed turning from your back to your side while in a flat bed without using bedrails?: A Lot Help needed moving from lying on your back to sitting on the side of a flat bed without using bedrails?: Total Help needed moving to and from a bed to a chair (including a wheelchair)?:  Total Help needed standing up from a chair using your arms (e.g., wheelchair or bedside chair)?: Total Help needed to walk in hospital room?: Total Help needed climbing 3-5 steps with a railing? : Total 6 Click Score: 7    End of Session   Activity Tolerance: Patient tolerated treatment well Patient left: in bed;with call bell/phone within reach;with bed alarm set Nurse Communication: Mobility status;Need for lift equipment PT Visit Diagnosis: Other abnormalities of gait and mobility (R26.89);Hemiplegia and hemiparesis;Muscle weakness (generalized) (M62.81) Hemiplegia - Right/Left: Right Hemiplegia - dominant/non-dominant: Dominant Hemiplegia - caused by: Cerebral infarction     Time: 1046-1110 PT Time Calculation (min) (ACUTE ONLY): 24 min  Charges:  $Therapeutic Activity: 23-37 mins                     Earney Navy, PTA Acute Rehabilitation Services Pager: [phone] Office: [phone]     Darliss Cheney 08/19/2019, 2:16 PM

## 2019-08-19 NOTE — Progress Notes (Signed)
Tele called to report paroxysmal SVT burst with HR reaching 142. Pt assessed and asymptomatic, HR now in 80s-90s. Dr. Lorraine Lax notified.    08/19/19 2018  Vitals  BP (!) 158/77 (recheck )  MAP (mmHg) 102  BP Location Left Arm  BP Method Automatic  Patient Position (if appropriate) Lying  Pulse Rate 90

## 2019-08-19 NOTE — Progress Notes (Signed)
Patient discussed with Dr. Grandville Silos and with stroke service.  We will hold on PEG tube placement as patient has woken up more and seems to be doing better with his D1 diet.  Plan to hold on PEG at this time.  Please reconsult if something changes.  Henreitta Cea 3:40 PM 08/19/2019

## 2019-08-19 NOTE — Progress Notes (Signed)
Patient had three large loose stools from the time of 1900-2030. Dr. Lorraine Lax made aware. Dr. Lorraine Lax asked to not give night time tube feeding.

## 2019-08-19 NOTE — Progress Notes (Signed)
Calorie Count Note  48 hour calorie count ordered.  Diet: Dysphagia 1, Nectar thick Liquids Supplements: Vital Cuisine shake po TID   Day 1 Results: Breakfast: 235 kcals, 11 grams of protein Lunch: 366 kcals, 18 grams of protein Dinner: 477 kcals, 19 grams of protein Supplements: 250 kcals, 11 grams of protein  Total intake: 1328 kcal (60% of minimum estimated needs)  59 protein (49% of minimum estimated needs)  Day 2 Results (to be completed 6/11) Breakfast: 398 kcals, 14 grams of protein Pt did not consume Vital Cuisine shake at breakfast.    Nutrition Dx: Inadequate oral intake related to inability to eat as evidenced by NPO status.  Progressing, pt now on Dysphagia 1 diet with Nectar thick liquids  Goal: Patient will meet greater than or equal to 90% of their needs  Progressing.  Intervention: Continue Vital Cuisine shake TID   Larkin Ina, MS, RD, LDN RD pager number and weekend/on-call pager number located in [place].

## 2019-08-19 NOTE — TOC Progression Note (Signed)
Transition of Care [facility]) - Progression Note    Patient Details  Name: Malik [name] MRN: [id number] Date of Birth: [birth year]  Transition of Care [facility]) CM/SW Contact  Pollie Friar, RN Phone Number: 08/19/2019, 2:03 PM  Clinical Narrative:    Per MD a PEG may not be needed. CM has updated the SNF the son was interested in: [facility] CM awaiting to hear if [facility] offer [facility] a re[facility] bed.    Expected Discharge Plan: [place] Barriers to Discharge: Continued Medical Work up  Expected Discharge Plan and Services Expected Discharge Plan: Between In-house Referral: Clinical Social Work Discharge Planning Services: CM Consult Post Acute Care Choice: [place] arrangements for the past 2 months: Single Family Home                                       Social Determinants of Health (SDOH) Interventions    Readmission Risk Interventions No flowsheet data found.

## 2019-08-19 NOTE — NC FL2 (Signed)
MEDICAID FL2 LEVEL OF CARE SCREENING TOOL     IDENTIFICATION  Patient Name: Malik [name] Birthdate: 1945-08-31 Sex: male Admission Date (Current Location): 08/10/2019  [facility] and [place] Number:  Engineering geologist and Address:  [facility] [place], [place] [address], [place], [place] [address]      Provider Number: [id number]  Attending Physician Name and Address:  Garvin Fila, MD  Relative Name and Phone Number:       Current Level of Care: Hospital Recommended Level of Care: [place] Prior Approval Number:    Date Approved/Denied:   PASRR Number: [id number]  Discharge Plan: SNF    Current Diagnoses: Patient Active Problem List   Diagnosis Date Noted  . Pressure injury of skin 08/13/2019  . Embolic stroke ([place]) 54/27/0623  . Thrombocytosis ([place])   . Acute lower UTI   . Labile blood pressure   . Hydrothorax   . Sleep disturbance   . Pleural effusion on left   . Hypertensive crisis   . Hypertension   . Leukocytosis   . Benign essential HTN   . Diabetes mellitus type 2 in nonobese (HCC)   . Acute blood loss anemia   . Trauma 05/27/2017  . TBI (traumatic brain injury) ([place])   . Essential hypertension   . Chronic diastolic congestive heart failure ([place])   . 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 Rib fractures 05/10/2017  . 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    Orientation RESPIRATION BLADDER Height & Weight     Self (follows commands)  Normal Incontinent Weight: 96.7 kg Height:  5\' 10"  (177.8 cm)  BEHAVIORAL SYMPTOMS/MOOD NEUROLOGICAL BOWEL NUTRITION STATUS      Incontinent Diet (dyshphagia 1 with nectar thick)  AMBULATORY STATUS COMMUNICATION OF NEEDS Skin   Extensive Assist  (follows commands) Other (Comment) (MASD to penis)                       Personal Care Assistance Level of Assistance  Bathing, Feeding, Dressing Bathing Assistance: Maximum assistance Feeding assistance:  Maximum assistance Dressing Assistance: Maximum assistance     Functional Limitations Info  Sight, Hearing, Speech Sight Info: Adequate Hearing Info: Adequate Speech Info: Impaired (mumbles/ dysarthric)    SPECIAL CARE FACTORS FREQUENCY  PT (By licensed PT), OT (By licensed OT), Speech therapy     PT Frequency: 5x/wk OT Frequency: 5x/wk     Speech Therapy Frequency: 5x/wk      Contractures Contractures Info: Not present    Additional Factors Info  Code Status, Allergies, Insulin Sliding Scale Code Status Info: Full Allergies Info: NKA   Insulin Sliding Scale Info: Novolog 0-15 units SQ every 4 hours       Current Medications (08/19/2019):  This is the current hospital active medication list Current Facility-Administered Medications  Medication Dose Route Frequency Provider Last Rate Last Admin  .  stroke: mapping our early stages of recovery book   Does not apply Once Rosalin Hawking, MD      . 0.9 %  sodium chloride infusion   Intravenous Continuous Rosalin Hawking, MD 50 mL/hr at 08/18/19 1219 New Bag at 08/18/19 1219  . acetaminophen (TYLENOL) tablet 650 mg  650 mg Oral Q4H PRN Rosalin Hawking, MD       Or  . acetaminophen (TYLENOL) 160 MG/5ML solution 650 mg  650 mg Per Tube Q4H PRN Rosalin Hawking, MD   650 mg at 08/16/19 1559   Or  .  acetaminophen (TYLENOL) suppository 650 mg  650 mg Rectal Q4H PRN Rosalin Hawking, MD      . amLODipine (NORVASC) tablet 10 mg  10 mg Per Tube Daily Rosalin Hawking, MD   10 mg at 08/19/19 0916  . aspirin chewable tablet 324 mg  324 mg Per Tube Daily Rosalin Hawking, MD   324 mg at 08/19/19 0916  . atorvastatin (LIPITOR) tablet 80 mg  80 mg Per Tube Daily Rosalin Hawking, MD   80 mg at 08/19/19 0916  . chlorhexidine (PERIDEX) 0.12 % solution 15 mL  15 mL Mouth Rinse BID Rosalin Hawking, MD   15 mL at 08/19/19 0916  . enoxaparin (LOVENOX) injection 40 mg  40 mg Subcutaneous Q24H Rosalin Hawking, MD   40 mg at 08/19/19 1219  . feeding supplement (OSMOLITE 1.5 CAL) liquid 650  mL  650 mL Per Tube Q24H Garvin Fila, MD      . feeding supplement (PRO-STAT SUGAR FREE 64) liquid 30 mL  30 mL Per Tube BID Garvin Fila, MD   30 mL at 08/19/19 0916  . folic acid (FOLVITE) tablet 1 mg  1 mg Per Tube Daily Rosalin Hawking, MD   1 mg at 08/19/19 0916  . free water 200 mL  200 mL Per Tube Q4H Rosalin Hawking, MD   200 mL at 08/19/19 0916  . insulin aspart (novoLOG) injection 0-15 Units  0-15 Units Subcutaneous Q4H Donzetta Starch, NP   3 Units at 08/19/19 1220  . labetalol (NORMODYNE) injection 5-20 mg  5-20 mg Intravenous Q2H PRN Rosalin Hawking, MD   20 mg at 08/13/19 1259  . MEDLINE mouth rinse  15 mL Mouth Rinse BID Aroor, Lanice Schwab, MD   15 mL at 08/19/19 0917  . metoprolol tartrate (LOPRESSOR) 25 mg/10 mL oral suspension 25 mg  25 mg Per Tube BID Rosalin Hawking, MD   25 mg at 08/19/19 0916  . multivitamin liquid 15 mL  15 mL Per Tube Daily Rosalin Hawking, MD   15 mL at 08/19/19 0915  . pantoprazole sodium (PROTONIX) 40 mg/20 mL oral suspension 40 mg  40 mg Per Tube QHS Garvin Fila, MD   40 mg at 08/18/19 2146  . Resource ThickenUp Clear   Oral PRN Garvin Fila, MD      . senna-docusate (Senokot-S) tablet 1 tablet  1 tablet Oral QHS PRN Rosalin Hawking, MD      . silver sulfADIAZINE (SILVADENE) 1 % cream   Topical Daily Donzetta Starch, NP   Given at 08/19/19 [phone]  . thiamine tablet 100 mg  100 mg Per Tube Daily Rosalin Hawking, MD   100 mg at 08/19/19 3646     Discharge Medications: Please see discharge summary for a list of discharge medications.  Relevant Imaging Results:  Relevant Lab Results:   Additional Information SS#: [id number]  Pollie Friar, RN

## 2019-08-20 LAB — GLUCOSE, CAPILLARY
Glucose-Capillary: 158 mg/dL — ABNORMAL HIGH (ref 70–99)
Glucose-Capillary: 104 mg/dL — ABNORMAL HIGH (ref 70–99)
Glucose-Capillary: 158 mg/dL — ABNORMAL HIGH (ref 70–99)
Glucose-Capillary: 77 mg/dL (ref 70–99)
Glucose-Capillary: 190 mg/dL — ABNORMAL HIGH (ref 70–99)
Glucose-Capillary: 133 mg/dL — ABNORMAL HIGH (ref 70–99)

## 2019-08-20 MED ORDER — ASPIRIN 81 MG PO CHEW
324.0000 mg | CHEWABLE_TABLET | Freq: Every day | ORAL | Status: DC
  Administered 2019-08-21 – 2019-08-23 (×3): 324 mg via ORAL
  Filled 2019-08-20 (×3): qty 4

## 2019-08-20 MED ORDER — ATORVASTATIN CALCIUM 80 MG PO TABS
80.0000 mg | ORAL_TABLET | Freq: Every day | ORAL | Status: DC
  Administered 2019-08-21 – 2019-08-24 (×4): 80 mg via ORAL
  Filled 2019-08-20 (×4): qty 1

## 2019-08-20 MED ORDER — FOLIC ACID 1 MG PO TABS
1.0000 mg | ORAL_TABLET | Freq: Every day | ORAL | Status: DC
  Administered 2019-08-21 – 2019-08-24 (×4): 1 mg via ORAL
  Filled 2019-08-20 (×4): qty 1

## 2019-08-20 NOTE — Progress Notes (Signed)
Calorie Count Note: Day 2  48-hour calorie count ordered. Please see note from 08/19/19 for results of day 1. Day 2 results are below.  Diet: dysphagia 1, nectar-thick liquids Supplements: Vital Cuisine Shake po TID  Day 2 (08/19/19): Breakfast: 398 kcal, 14 grams protein Lunch: 292 kcal, 3 grams protein Dinner: 417 kcal, 23 grams protein  Total 24-hour intake: 1107 kcal (50% of minimum estimated needs)  39 grams protein (33% of minimum estimated needs)  Nutrition Diagnosis: Inadequate oral intakerelated to inability to eatas evidenced by NPO status.  Goal: Patient will meet greater than or equal to 90% of their needs  Intervention: - d/c calorie count - Continue Vital Cuisine Shake TID, each supplement provides 520 kcal and 22 grams of protein - Continue feeding assistance with all meals - Recommend continuing nocturnal tube feeds via Cortrak: Osmolite 1.5 @ 65 ml/hr for 10 hours with Pro-stat 30 ml BID and free water flushes (meets ~50% of estimated needs)   Gaynell Face, MS, RD, LDN Inpatient Clinical Dietitian Pager: [phone] Weekend/After Hours: [phone]

## 2019-08-20 NOTE — Progress Notes (Signed)
STROKE TEAM PROGRESS NOTE   INTERVAL HISTORY   Speech therapist is  at bedside. Pt patient is alert and interactive today.   He has been eating up to 60-70% of his meals.  No new changes.  Vital signs stable. Vitals:   08/20/19 0305 08/20/19 0730 08/20/19 1204 08/20/19 1237  BP:  (!) 152/94 (!) 153/78 137/73  Pulse:  86 77 81  Resp:  (!) 22 18   Temp:  98.5 F (36.9 C) 97.8 F (36.6 C)   TempSrc:  Oral Oral   SpO2:  96% 95%   Weight: 97.7 kg     Height:       CBC:  Recent Labs  Lab 08/15/19 0545 08/17/19 1149  WBC 13.4* 13.0*  HGB 8.9* 8.8*  HCT 30.6* 30.5*  MCV 70.7* 70.8*  PLT 412* 709*   Basic Metabolic Panel:  Recent Labs  Lab 08/14/19 0623 08/14/19 0623 08/14/19 1642 08/15/19 0545 08/17/19 1149  NA 137   < >  --  138 137  K 3.5   < >  --  3.9 4.3  CL 107   < >  --  109 107  CO2 21*   < >  --  21* 21*  GLUCOSE 192*   < >  --  210* 122*  BUN 18   < >  --  14 15  CREATININE 1.29*   < >  --  1.12 0.87  CALCIUM 8.3*   < >  --  8.2* 8.6*  MG 2.0  --  2.1  --   --   PHOS 3.1  --  3.0  --   --    < > = values in this interval not displayed.   Lipid Panel:     Component Value Date/Time   CHOL 189 08/11/2019 0625   TRIG 64 08/11/2019 0625   HDL 49 08/11/2019 0625   CHOLHDL 3.9 08/11/2019 0625   VLDL 13 08/11/2019 0625   LDLCALC 127 (H) 08/11/2019 0625   HgbA1c:  Lab Results  Component Value Date   HGBA1C 5.4 08/11/2019   Urine Drug Screen:     Component Value Date/Time   LABOPIA NONE DETECTED 08/11/2019 0343   COCAINSCRNUR NONE DETECTED 08/11/2019 0343   LABBENZ NONE DETECTED 08/11/2019 0343   AMPHETMU NONE DETECTED 08/11/2019 0343   THCU NONE DETECTED 08/11/2019 0343   LABBARB NONE DETECTED 08/11/2019 0343    Alcohol Level     Component Value Date/Time   ETH 46 (H) 08/10/2019 1730    IMAGING past 24 hours  No results found.  PHYSICAL EXAM    Temp:  [97.8 F (36.6 C)-99.5 F (37.5 C)] 97.8 F (36.6 C) (06/11 1204) Pulse Rate:   [77-92] 81 (06/11 1237) Resp:  [18-23] 18 (06/11 1204) BP: (137-183)/(73-94) 137/73 (06/11 1237) SpO2:  [95 %-100 %] 95 % (06/11 1204) Weight:  [97.7 kg] 97.7 kg (06/11 0305)  General - well nourished, well developed elderly African-American male, in no apparent distress.    Ophthalmologic - fundi not visualized due to noncooperation.    Cardiovascular - regular rhythm and rate   Neuro - awake, eyes open, but global aphasia, not following commands, mumbling words but intangible. Eyes left gaze preference, barely cross midline. Mild right nasolabial fold flattening. Not blinking to visual threat on the right, but able to blink to visual threat on the left. LUE and LLE spontaneous movement against gravity, but RUE and RLE flaccid. Sensation, coordination and gait not tested.  ASSESSMENT/PLAN Malik [name] is a 74 y.o. male with history of HTN, DM, HLD, heavy alcohol who had sudden onset aphasia, L gaze preference and R hemiplegia. EKG showed AF en route. High BP treated with labetalol. Received tPA 08/10/2019 at 1749.  Stroke:   L MCA infarct s/p tPA and attempted IR L G3/O7 occlusion, embolic secondary to unknown source   Code Stroke CT head No acute abnormality. ASPECTS 10.     CTA head proximal to mid L M2 occlusion. Mild B ICA atherosclerosis.  CTA neck B ICA bifurcation and bulbs atherosclerosis. L VA origin atherosclerosis. 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 [place] recommended  Cerebral angio L M3 superior branch occlusion w/ subocclusive filling defect.    Post IR CT Minimal contrast extravasation L M3 branch  CT 6/2 progression of L MCA infarct, hypodensity L middle frontal gyrus extending into L parietal and L occipital lobe L sylvian fissure SAH.  MRI  Large evolving L MCA infarct w/ hemorrhagic transformation w/o discrete hematoma. Also L ACA infarct and ACA/PCA watershed territory. No significant mass effect.   MRA  Motion. Proximal L M2 branch  occlusion   2D Echo EF 50-55%. No source of embolus. B atrial mildly dilated.  LE venous doppler no DVT  LDL 127  HgbA1c 5.4  SCDs for VTE prophylaxis  No antithrombotic prior to admission, on ASA 325. Not on DAPT due to possible PEG placement.    Therapy recommendations:  SNF recommended  Disposition:  pending   Hypertensive Emergency  SBP 211/133 on arrival  Home meds:  norvasc 10, lasix 20, hydralazine 100 q8, metoprolol 25 bid (pt not taking)  Labetalol prior to tPA  Off cleviprex gtt    Stable on the high end  SBP goal < 180  Resume metoprolol 25 bid and amlodipine 10  Long-term BP goal 130-150 given left M2 occlusion  Hyperlipidemia  Home meds:  lipitor 40  LDL 127, goal < 70  On lipitor 80   Resume statin at discharge   Diabetes type II, hyperglycemia   Home meds:  Glipizide 10, metformin 500 bid  HgbA1c 5.4, goal < 7.0  CBGs   SSI  PCP follow up  ?? Afib  EMS ECG showed probably afib  So far tele in hospital not in afib  Pt will need loop recorder to rule out afib if his neuro status improves  Dysphagia  Secondary to stroke  NPO  Speech on board  S/p cortrak   On TF @ 64 and IVF @ 50 and add FW 200 Q4  Did   pass MBSS on dysphagia 1 diet but will monitor his oral intake to see if he can maintain his nutrition- trauma consulted for PEG if needed   Fever Leukocytosis   Fever 100.7->101.3  WBC 12.2 ->11.7->14.5->17.2->14.1-13.2  UA large LE, 21-50 WBC, few bacteria.  UCx neg  CXR - Cardiomegaly. Bibasilar atelectasis. Infection not excluded.  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 [place] recommended  Hyperkalemia 3.3->5.8->4.9 - resolved ->3.5  Hospital day # 10  Continue ongoing medical management. DC tube feeds. Encourage dysphagia 1 diet oral intake and monitor it carefully and if is eating adequately may consider transfer to skilled nursing facility early next week and may not need PEG tube. Trauma team on standby for PEG if needed.    Antony Contras, MD.   To contact Stroke Continuity provider, please refer to [email or web address] After hours, contact General Neurology

## 2019-08-20 NOTE — Plan of Care (Signed)
  Problem: Education: Goal: Knowledge of disease or condition will improve Outcome: Progressing Goal: Knowledge of secondary prevention will improve Outcome: Progressing Goal: Knowledge of patient specific risk factors addressed and post discharge goals established will improve Outcome: Progressing Goal: Individualized Educational Video(s) Outcome: Progressing   Problem: Coping: Goal: Will verbalize positive feelings about self Outcome: Progressing Goal: Will identify appropriate support needs Outcome: Progressing   Problem: Health Behavior/Discharge Planning: Goal: Ability to manage health-related needs will improve Outcome: Progressing   Problem: Self-Care: Goal: Ability to participate in self-care as condition permits will improve Outcome: Progressing Goal: Verbalization of feelings and concerns over difficulty with self-care will improve Outcome: Progressing Goal: Ability to communicate needs accurately will improve Outcome: Progressing   Problem: Ischemic Stroke/TIA Tissue Perfusion: Goal: Complications of ischemic stroke/TIA will be minimized Outcome: Progressing   Problem: Education: Goal: Knowledge of General Education information will improve Description: Including pain rating scale, medication(s)/side effects and non-pharmacologic comfort measures Outcome: Progressing   Problem: Coping: Goal: Level of anxiety will decrease Outcome: Progressing   Problem: Elimination: Goal: Will not experience complications related to bowel motility Outcome: Progressing Goal: Will not experience complications related to urinary retention Outcome: Progressing

## 2019-08-20 NOTE — TOC Progression Note (Signed)
Transition of Care [facility]) - Progression Note    Patient Details  Name: Evertte Sones MRN: [id number] Date of Birth: 1946/01/15  [facility]) CM/SW Contact  Pollie Friar, RN Phone Number: 08/20/2019, 4:30 PM  Clinical Narrative:    Waiting to hear from insurance for SNF rehab. Also waiting to hear [facility].  TOC following.   Expected Discharge Plan: [place] Barriers to Discharge: Continued Medical Work up  Expected Discharge Plan and Services Expected Discharge Plan: [place] In-house Referral: Clinical Social Work Discharge Planning Services: CM Consult Post Acute Care Choice: [place] arrangements for the past 2 months: Single Family Home                                       Social Determinants of Health (SDOH) Interventions    Readmission Risk Interventions No flowsheet data found.

## 2019-08-20 NOTE — Progress Notes (Signed)
Physical Therapy Treatment Note  Patient is making gradual progress toward PT goals. Pt able to stand X 2 trials with +2 assist utilizing Stedy steady frame. Pt continues to present with R side hemiplegia and follows single step cues inconsistently and with increased time/repetition. Continue to progress as tolerated with anticipated d/c to SNF for further skilled PT services.     08/20/19 1127  PT Visit Information  Last PT Received On 08/20/19  Assistance Needed +2  PT/OT/SLP Co-Evaluation/Treatment Yes  Reason for Co-Treatment Complexity of the patient's impairments (multi-system involvement);Necessary to address cognition/behavior during functional activity;For patient/therapist safety;To address functional/ADL transfers  PT goals addressed during session Mobility/safety with mobility;Balance  History of Present Illness This 74 y.o. male admitted with sudden onset of aphasia, Lt gaze preference, and Rt hemiplegia.   He was found to have Lt MCA infarct with proximal to mid M2/M3 occlusion.  He is s/p tPA and thrombectomy.  Repeast CT showed significant progression of Lt MCA infarct with hypodensity in teh Lt middle frontal gyrus extending into the Lt parietal and Lt occipital lobe.  Linear hyperdensity in the Lt sylvian fissure likely represents small SAH.  Repeat MRI showed large Lt evolving MCA infarct with evidence of hemorrhagic conversion as well as some watershed involvement in the ACA, PCA, and LT ACA.   PMH includes:  TBI, neuropathy, LVH, HTN, Dilated cardiomyopathy, DM, chronic diastolic CHF.  ETOH abuse.   Subjective Data  Patient Stated Goal patient unable to state, no family present  Precautions  Precautions Fall  Precaution Comments Lt pusher, cortrak  Restrictions  Weight Bearing Restrictions No  Pain Assessment  Pain Assessment Faces  Faces Pain Scale 4 (pt with difficulty commmunicating with therapists; tried use of "yes" "no" communication board with little success)  Pain  Location grimacing during mobility; likely experiencing pain in stomach based on gestures  Pain Descriptors / Indicators Grimacing  Pain Intervention(s) Monitored during session;Repositioned;Other (comment) (notified RN)  Cognition  Arousal/Alertness Awake/alert  Behavior During Therapy Flat affect  Overall Cognitive Status Impaired/Different from baseline  Area of Impairment Attention;Following commands;Problem solving  Current Attention Level Focused  Following Commands Follows one step commands inconsistently;Follows one step commands with increased time  Problem Solving Slow processing;Decreased initiation  General Comments pt follows commands intermittently; pt continues to be flat during session. overall, pt requires increased time and multimodal cues to follow commands  Bed Mobility  Overal bed mobility Needs Assistance  Bed Mobility Supine to Sit;Sit to Sidelying;Rolling  Rolling Mod assist  Supine to sit +2 for physical assistance;+2 for safety/equipment;HOB elevated;Max assist  Sit to supine Total assist;+2 for physical assistance;+2 for safety/equipment  General bed mobility comments pt able to roll toward R side with mod A; assist required to bring bilat LE/hips to EOB with use of bed pad and to elevate trunk into sitting; pt able to assist with L UE/LE with multimodal cues and increased time   Transfers  Overall transfer level Needs assistance  Equipment used Ambulation equipment used  Transfer via Editor, commissioning Sit to/from Stand  Sit to Stand Mod assist;+2 physical assistance  General transfer comment Pt required assist to positon BUEs on stedy and required MAX A for sitting balance on pts R side, however pt able to power up into standing with MOD A +2 x2 trials with R UE supported   Modified Rankin (Stroke Patients Only)  Pre-Morbid Rankin Score 1 (unsure of baseline)  Modified Rankin 5  Balance  Overall balance assessment  Needs assistance   Sitting-balance support Feet supported;Bilateral upper extremity supported  Sitting balance-Leahy Scale Poor  Sitting balance - Comments pt required multimodal cues to achieve midline position neeeding MAX A initially on R side however progressing to MIN A on R side  Postural control Right lateral lean  Standing balance support Bilateral upper extremity supported  Standing balance-Leahy Scale Zero  PT - End of Session  Activity Tolerance Patient tolerated treatment well  Patient left in bed;with call bell/phone within reach;with bed alarm set  Nurse Communication Mobility status;Need for lift equipment   PT - Assessment/Plan  PT Plan Current plan remains appropriate  PT Visit Diagnosis Other abnormalities of gait and mobility (R26.89);Hemiplegia and hemiparesis;Muscle weakness (generalized) (M62.81)  Hemiplegia - Right/Left Right  Hemiplegia - dominant/non-dominant Dominant  Hemiplegia - caused by Cerebral infarction  PT Frequency (ACUTE ONLY) Min 3X/week  Follow Up Recommendations SNF  PT equipment None recommended by PT  AM-PAC PT "6 Clicks" Mobility Outcome Measure (Version 2)  Help needed turning from your back to your side while in a flat bed without using bedrails? 2  Help needed moving from lying on your back to sitting on the side of a flat bed without using bedrails? 1  Help needed moving to and from a bed to a chair (including a wheelchair)? 1  Help needed standing up from a chair using your arms (e.g., wheelchair or bedside chair)? 1  Help needed to walk in hospital room? 1  Help needed climbing 3-5 steps with a railing?  1  6 Click Score 7  Consider Recommendation of Discharge To: CIR/SNF/LTACH  PT Goal Progression  Progress towards PT goals Progressing toward goals  PT Time Calculation  PT Start Time (ACUTE ONLY) 6701  PT Stop Time (ACUTE ONLY) 0902  PT Time Calculation (min) (ACUTE ONLY) 41 min  PT General Charges  $$ ACUTE PT VISIT 1 Visit  PT Treatments   $Therapeutic Activity 8-22 mins  $Neuromuscular Re-education 8-22 mins   Earney Navy, PTA Acute Rehabilitation Services Pager: [phone] Office: [phone]

## 2019-08-20 NOTE — Progress Notes (Signed)
  Speech Language Pathology Treatment: Dysphagia;Cognitive-Linquistic  Patient Details Name: Malik [name] MRN: [id number] DOB: 1945/11/17 Today's Date: 08/20/2019 Time: 9179-1505 SLP Time Calculation (min) (ACUTE ONLY): 16 min  Assessment / Plan / Recommendation Clinical Impression  Pt is alert and more interactive, sustaining attention to self-feeding once given Min cues for initiation. When self-feeding he tends to get larger bites and places his food more in the R side of his mouth where he has less ability to clear. He has more oral residue as a result, but this is managed by SLP providing Max-Total A for command following to guide him more toward the center/L side of his mouth while feeding. No overt s/s of aspiration noted today. SLP will continue to follow for dysphagia and communication goals.    HPI HPI: Pt is a 74 yo male presenting with acute onset R weakness and difficulty speaking s/p tPA and L MCA thrombectomy 6/1. CT Head 6/2 showed significant progression of L MCA infarct with hypodensity in the L middle frontal gyrus extending into the L parietal lobe and L occipital lobe. BSE in 2019 recommended NPO but he progressed very quickly to regular solids/thin liquids as mentation improved. PMH includes: TBI, sleep apnea, HTN, DM, CHF, HLD, heavy alcohol use      SLP Plan  Continue with current plan of care       Recommendations  Diet recommendations: Dysphagia 1 (puree);Nectar-thick liquid Liquids provided via: Teaspoon Medication Administration: Crushed with puree Supervision: Staff to assist with self feeding;Full supervision/cueing for compensatory strategies Compensations: Minimize environmental distractions;Slow rate;Small sips/bites;Monitor for anterior loss;Other (Comment) Postural Changes and/or Swallow Maneuvers: Seated upright 90 degrees;Upright 30-60 min after meal                Oral Care Recommendations: Oral care BID Follow up Recommendations: Skilled  Nursing facility SLP Visit Diagnosis: Dysphagia, oropharyngeal phase (R13.12);Aphasia (R47.01) Plan: Continue with current plan of care       GO                 Osie Bond., M.A. [place] Acute Rehabilitation Services Pager [phone] Office [phone]  08/20/2019, 12:55 PM

## 2019-08-20 NOTE — Progress Notes (Signed)
Occupational Therapy Treatment Patient Details Name: Malik [name] MRN: [id number] DOB: [birth year] Today's Date: 08/20/2019    History of present illness This 74 y.o. male admitted with sudden onset of aphasia, Lt gaze preference, and Rt hemiplegia.   He was found to have Lt MCA infarct with proximal to mid M2/M3 occlusion.  He is s/p tPA and thrombectomy.  Repeast CT showed significant progression of Lt MCA infarct with hypodensity in teh Lt middle frontal gyrus extending into the Lt parietal and Lt occipital lobe.  Linear hyperdensity in the Lt sylvian fissure likely represents small SAH.  Repeat MRI showed large Lt evolving MCA infarct with evidence of hemorrhagic conversion as well as some watershed involvement in the ACA, PCA, and LT ACA.   PMH includes:  TBI, neuropathy, LVH, HTN, Dilated cardiomyopathy, DM, chronic diastolic CHF.  ETOH abuse.    OT comments  Pt seen in conjunction with PT to optimize pts activity tolerance and progress pt towards functional mobility goals. Overall, pt requires MAX- total A +2 for bed mobility. Pt continues to present with poor sitting balance needing MAX - MIN A on pts R side to maintain upright posture. Pt was able to progress to standing x2 this session with use of stedy and MODA +2 to power up. Pt following commands intermittent during session. Continue to recommend SNF for DC ; will follow acutely per POC.   Follow Up Recommendations  SNF    Equipment Recommendations  None recommended by OT    Recommendations for Other Services      Precautions / Restrictions Precautions Precautions: Fall Precaution Comments: Lt pusher, cortrak       Mobility Bed Mobility Overal bed mobility: Needs Assistance Bed Mobility: Supine to Sit;Sit to Sidelying;Rolling Rolling: Mod assist   Supine to sit: +2 for physical assistance;+2 for safety/equipment;HOB elevated;Max assist Sit to supine: Total assist;+2 for physical assistance;+2 for safety/equipment    General bed mobility comments: pt able to roll with MOD A this session; MAX A +2 for supine>sit with needing assist with manage BLEs to EOB with HOB elevated. total A to return to supine  Transfers Overall transfer level: Needs assistance Equipment used: Ambulation equipment used Transfers: Sit to/from Stand Sit to Stand: Mod assist;+2 physical assistance         General transfer comment: Pt required assist to [place] on stedy and required MAX A for sitting balance on pts R side, however pt able to power up into standing with MOD A +2 x2 trials    Balance Overall balance assessment: Needs assistance Sitting-balance support: Feet supported;Bilateral upper extremity supported Sitting balance-Leahy Scale: Poor Sitting balance - Comments: pt required multimodal cues to achieve midline position neeeding MAX A initially on R side however progressing to MIN A on R side Postural control: Right lateral lean Standing balance support: Bilateral upper extremity supported Standing balance-Leahy Scale: Poor Standing balance comment: reliant on BUE support                           ADL either performed or assessed with clinical judgement   ADL Overall ADL's : Needs assistance/impaired     Grooming: Wash/dry face;Sitting;Total assistance Grooming Details (indicate cue type and reason): requires hand over hand support to engage L UE to face to wash                  Toilet Transfer: Moderate assistance;+2 for physical assistance Toilet Transfer Details (indicate cue type  and reason): sit<>stand only from stedy x2         Functional mobility during ADLs: Maximal assistance;+2 for physical assistance;+2 for safety/equipment;Total assistance General ADL Comments: pt able to progress to sit<>stand with stedy this session MOD A +2. Pt continues to present with poor sitting balance, RUE weakness and decreased activity tolerance impacting pts activity tolerance     Vision        Perception     Praxis      Cognition Arousal/Alertness: Awake/alert Behavior During Therapy: Flat affect Overall Cognitive Status: Impaired/Different from baseline Area of Impairment: Attention;Following commands;Problem solving                   Current Attention Level: Focused   Following Commands: Follows one step commands inconsistently;Follows one step commands with increased time     Problem Solving: Slow processing;Decreased initiation General Comments: pt follows commands intermittently; pt continues to be flat during session. overall, pt requires increased time and multimodal cues to follow commands        Exercises     Shoulder Instructions       General Comments attempted use of communication board with "yes" and "no" with little success. Pt noted to have redness around penis; RN aware    Pertinent Vitals/ Pain       Pain Assessment: Faces Faces Pain Scale: Hurts little more (pt with difficulty commmunicating with therapists; tried use of "yes" "no" communication board with little success) Pain Location: grimacing during mobility; likely experiencing pain in stomach based on gestures Pain Descriptors / Indicators: Grimacing Pain Intervention(s): Limited activity within patient's tolerance;Monitored during session;Repositioned;Other (comment) (RN aware)  Home Living                                          Prior Functioning/Environment              Frequency  Min 2X/week        Progress Toward Goals  OT Goals(current goals can now be found in the care plan section)  Progress towards OT goals: Progressing toward goals  Acute Rehab OT Goals Patient Stated Goal: patient unable to state, no family present OT Goal Formulation: Patient unable to participate in goal setting Time For Goal Achievement: 08/26/19 Potential to Achieve Goals: Good  Plan Discharge plan remains appropriate;Frequency remains appropriate     Co-evaluation    PT/OT/SLP Co-Evaluation/Treatment: Yes Reason for Co-Treatment: Complexity of the patient's impairments (multi-system involvement);For patient/therapist safety;Necessary to address cognition/behavior during functional activity;To address functional/ADL transfers   OT goals addressed during session: ADL's and self-care      AM-PAC OT "6 Clicks" Daily Activity     Outcome Measure   Help from another person eating meals?: Total Help from another person taking care of personal grooming?: Total Help from another person toileting, which includes using toliet, bedpan, or urinal?: Total Help from another person bathing (including washing, rinsing, drying)?: Total Help from another person to put on and taking off regular upper body clothing?: Total Help from another person to put on and taking off regular lower body clothing?: Total 6 Click Score: 6    End of Session Equipment Utilized During Treatment: Gait belt;Other (comment) (stedy)  OT Visit Diagnosis: Hemiplegia and hemiparesis Hemiplegia - Right/Left: Right Hemiplegia - dominant/non-dominant: Dominant Hemiplegia - caused by: Cerebral infarction   Activity Tolerance Patient tolerated treatment well  Patient Left in bed;with call bell/phone within reach;with bed alarm set   Nurse Communication Mobility status        Time: 0821-0902 OT Time Calculation (min): 41 min  Charges: OT General Charges $OT Visit: 1 Visit OT Treatments $Self Care/Home Management : 8-22 mins  Lanier Clam., COTA/L Acute Rehabilitation Services [phone]    Ihor Gully 08/20/2019, 10:15 AM

## 2019-08-21 LAB — GLUCOSE, CAPILLARY
Glucose-Capillary: 90 mg/dL (ref 70–99)
Glucose-Capillary: 112 mg/dL — ABNORMAL HIGH (ref 70–99)
Glucose-Capillary: 132 mg/dL — ABNORMAL HIGH (ref 70–99)
Glucose-Capillary: 132 mg/dL — ABNORMAL HIGH (ref 70–99)
Glucose-Capillary: 110 mg/dL — ABNORMAL HIGH (ref 70–99)
Glucose-Capillary: 106 mg/dL — ABNORMAL HIGH (ref 70–99)

## 2019-08-21 NOTE — TOC Progression Note (Signed)
Transition of Care [facility]) - Progression Note    Patient Details  Name: Malik [name] MRN: [id number] Date of Birth: Apr 29, 1945  Transition of Care [facility]) CM/SW [place], [place] Phone Number: 08/21/2019, 12:14 PM  Clinical Narrative:     CSW spoke with patients daughter [name] and she is agreement to SNF placement at [facility] in [place]. CSW updated insurance authorization with facility choice. Insurance authorization is still pending. Insurance authorization reference number is [id number].  Patient has bed at [facility] when medically ready for discharge. Insurance authorization is still pending.  Expected Discharge Plan: [place] Barriers to Discharge: Continued Medical Work up  Expected Discharge Plan and Services Expected Discharge Plan: [place] In-house Referral: Clinical Social Work Discharge Planning Services: CM Consult Post Acute Care Choice: [place] arrangements for the past 2 months: Single Family Home                                       Social Determinants of Health (SDOH) Interventions    Readmission Risk Interventions No flowsheet data found.

## 2019-08-21 NOTE — Progress Notes (Signed)
STROKE TEAM PROGRESS NOTE   INTERVAL HISTORY   Nurse at bedside, no changes overnight, neurologically stable. Ate 50% of his breakfast today, non verbal, may grunt, no meaningful speech, still has Cortrak in place and calorie counting to see if that can come out. Nectar thick liquids doing well no coughing.  No new changes.  Vital signs stable. CIWA stable for days no withdrawal can stop protocol at this time.    Vitals:   08/21/19 0308 08/21/19 0759 08/21/19 1000 08/21/19 1218  BP: (!) 153/81 (!) 148/85 (!) 165/90 135/80  Pulse: 82 84 87 79  Resp: 18 18  18   Temp: 98.6 F (37 C) 99 F (37.2 C)  98.8 F (37.1 C)  TempSrc: Oral Oral  Oral  SpO2: 94% 99%  95%  Weight:      Height:       CBC:  Recent Labs  Lab 08/15/19 0545 08/17/19 1149  WBC 13.4* 13.0*  HGB 8.9* 8.8*  HCT 30.6* 30.5*  MCV 70.7* 70.8*  PLT 412* 101*   Basic Metabolic Panel:  Recent Labs  Lab 08/14/19 1642 08/15/19 0545 08/17/19 1149  NA  --  138 137  K  --  3.9 4.3  CL  --  109 107  CO2  --  21* 21*  GLUCOSE  --  210* 122*  BUN  --  14 15  CREATININE  --  1.12 0.87  CALCIUM  --  8.2* 8.6*  MG 2.1  --   --   PHOS 3.0  --   --    Lipid Panel:     Component Value Date/Time   CHOL 189 08/11/2019 0625   TRIG 64 08/11/2019 0625   HDL 49 08/11/2019 0625   CHOLHDL 3.9 08/11/2019 0625   VLDL 13 08/11/2019 0625   LDLCALC 127 (H) 08/11/2019 0625   HgbA1c:  Lab Results  Component Value Date   HGBA1C 5.4 08/11/2019   Urine Drug Screen:     Component Value Date/Time   LABOPIA NONE DETECTED 08/11/2019 0343   COCAINSCRNUR NONE DETECTED 08/11/2019 0343   LABBENZ NONE DETECTED 08/11/2019 0343   AMPHETMU NONE DETECTED 08/11/2019 0343   THCU NONE DETECTED 08/11/2019 0343   LABBARB NONE DETECTED 08/11/2019 0343    Alcohol Level     Component Value Date/Time   ETH 46 (H) 08/10/2019 1730    IMAGING past 24 hours  No results found.  PHYSICAL EXAM   Temp:  [98.4 F (36.9 C)-99 F (37.2 C)]  98.8 F (37.1 C) (06/12 1218) Pulse Rate:  [79-87] 79 (06/12 1218) Resp:  [18-22] 18 (06/12 1218) BP: (135-168)/(78-90) 135/80 (06/12 1218) SpO2:  [94 %-100 %] 95 % (06/12 1218) Weight:  [98.5 kg] 98.5 kg (06/12 0145)  General - well nourished, well developed elderly African-American male, in no apparent distress.    Ophthalmologic - fundi not visualized due to noncooperation.    Cardiovascular - regular rhythm and rate   Neuro - sleeping but opens eyes to stim, hard to keep him awake,  global aphasia, follows simple command on the left with encouragement, mumbling words but intangible. Eyes left gaze preference, barely cross midline. Mild right nasolabial fold flattening. Not blinking to visual threat on the right, but able to blink to visual threat on the left. LUE and LLE spontaneous movement against gravity without drift, but RUE and RLE 0/5 with increased tone in the right arm. Sensation, coordination and gait not cooperative.  ASSESSMENT/PLAN [name] is a  74 y.o. male with history of HTN, DM, HLD, heavy alcohol who had sudden onset aphasia, L gaze preference and R hemiplegia. EKG showed AF en route. High BP treated with labetalol. Received tPA 08/10/2019 at 1749.  Stroke:   L MCA infarct s/p tPA and attempted IR L H4/V4 occlusion, embolic secondary to unknown source   Code Stroke CT head No acute abnormality. ASPECTS 10.     CTA head proximal to mid L M2 occlusion. Mild B ICA atherosclerosis.  CTA neck B ICA bifurcation and bulbs atherosclerosis. L VA origin atherosclerosis. 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 [place] recommended  Cerebral angio L M3 superior branch occlusion w/ subocclusive filling defect.    Post IR CT Minimal contrast extravasation L M3 branch  CT 6/2 progression of L MCA infarct, hypodensity L middle frontal gyrus extending into L parietal and L occipital lobe L sylvian fissure SAH.  MRI  Large evolving L MCA infarct w/  hemorrhagic transformation w/o discrete hematoma. Also L ACA infarct and ACA/PCA watershed territory. No significant mass effect.   MRA  Motion. Proximal L M2 branch occlusion   2D Echo EF 50-55%. No source of embolus. B atrial mildly dilated.  LE venous doppler no DVT  LDL 127  HgbA1c 5.4  SCDs for VTE prophylaxis  No antithrombotic prior to admission, on ASA 325. Not on DAPT due to possible PEG placement.    Therapy recommendations:  SNF recommended  Disposition:  pending   Hypertensive Emergency  SBP 211/133 on arrival  Home meds:  norvasc 10, lasix 20, hydralazine 100 q8, metoprolol 25 bid (pt not taking)  Labetalol prior to tPA  Off cleviprex gtt    Stable on the high end . SBP goal < 180 . Resume metoprolol 25 bid and amlodipine 10 . Long-term BP goal 130-150 given left M2 occlusion (148/85  ;  165/90)  Hyperlipidemia  Home meds:  lipitor 40  LDL 127, goal < 70  On lipitor 80   Resume statin at discharge   Diabetes type II, hyperglycemia   Home meds:  Glipizide 10, metformin 500 bid  HgbA1c 5.4, goal < 7.0  CBGs   SSI  PCP follow up  ?? Afib  EMS ECG showed probably afib  So far tele in hospital not in afib  Pt will need loop recorder to rule out afib if his neuro status improves  Dysphagia . Secondary to stroke . NPO . Speech on board . S/p cortrak  . On TF @ 50 and IVF @ 50 and add FW 200 Q4 . Did   pass MBSS on dysphagia 1 diet but will monitor his oral intake to see if he can maintain his nutrition- trauma consulted for PEG if needed. Marland Kitchen PEG currently on hold per trauma service note due to increased PO intake.  Not on DAPT due to possible PEG placement.     Fever Leukocytosis   Fever 100.7->101.3.......99 repeat labs pending  WBC 12.2 ->11.7->14.5->17.2->14.1-13.2......13.3->13.0 - repeat labs pending  UA large LE, 21-50 WBC, few bacteria.  UCx neg  CXR - Cardiomegaly. Bibasilar atelectasis. Infection not excluded.  On  Rocephin 6/3>> x 5 days (currently off all antibiotics)  CKD - stage IIIa  Cre 1.34->1.32->0.92 ->1.07 ->1.29.....0.87 repeat labs pending  Continue TF and IVF, add FW  BMP monitoring  ETOH abuse  Alcohol level 46  As per family, he is heavy drinker  CIWA protocol - discontinued  B1 and FA and MVI  Other Stroke Risk  Factors  Advanced age  Former cigarette smoker  Obesity, Body mass index is 31.16 kg/m., recommend weight loss, diet and exercise as appropriate   Coronary artery disease - angina   Obstructive sleep apnea  Chronic diastolic Congestive heart failure  Dilated cardiomyopathy, LVH  Other Active Problems  Hx TBI  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 A dominant right thyroid nodule measures 3.5 cm. Nonemergent thyroid ultrasound is recommended for further evaluation. (by CTA 6/1)  Hyperkalemia 3.3->5.8->4.9 - resolved ->3.5 .....4.3 repeat labs pending  Anemia - Hb - 8.9->8.8 repeat labs pending  Check labs in AM  Hospital day # 11   Continue ongoing medical management. DC tube feeds. Encourage dysphagia 1 diet oral intake and monitor it carefully and if is eating adequately may consider transfer to skilled nursing facility early next week and may not need PEG tube. Trauma team on standby for PEG if needed.     Personally examined patient and images, and have participated in and made any corrections needed to history, physical, neuro exam,assessment and plan as stated above.  I have personally obtained the history, evaluated lab date, reviewed imaging studies and agree with radiology interpretations.    Sarina Ill, MD Stroke Neurology  I spent 25 minutes of face-to-face and non-face-to-face time with patient. This included prechart review, lab review, study review, order entry, electronic health record documentation, patient education on the different diagnostic and therapeutic options, counseling and  coordination of care, risks and benefits of management, compliance, or risk factor reduction   To contact Stroke Continuity provider, please refer to [email or web address] After hours, contact General Neurology

## 2019-08-22 DIAGNOSIS — R1312 Dysphagia, oropharyngeal phase: Secondary | ICD-10-CM

## 2019-08-22 DIAGNOSIS — D509 Iron deficiency anemia, unspecified: Secondary | ICD-10-CM

## 2019-08-22 LAB — GLUCOSE, CAPILLARY
Glucose-Capillary: 107 mg/dL — ABNORMAL HIGH (ref 70–99)
Glucose-Capillary: 108 mg/dL — ABNORMAL HIGH (ref 70–99)
Glucose-Capillary: 111 mg/dL — ABNORMAL HIGH (ref 70–99)
Glucose-Capillary: 124 mg/dL — ABNORMAL HIGH (ref 70–99)
Glucose-Capillary: 127 mg/dL — ABNORMAL HIGH (ref 70–99)
Glucose-Capillary: 150 mg/dL — ABNORMAL HIGH (ref 70–99)

## 2019-08-22 LAB — CBC
HCT: 25.8 % — ABNORMAL LOW (ref 39.0–52.0)
Hemoglobin: 7.7 g/dL — ABNORMAL LOW (ref 13.0–17.0)
MCH: 20.7 pg — ABNORMAL LOW (ref 26.0–34.0)
MCHC: 29.8 g/dL — ABNORMAL LOW (ref 30.0–36.0)
MCV: 69.4 fL — ABNORMAL LOW (ref 80.0–100.0)
Platelets: 588 10*3/uL — ABNORMAL HIGH (ref 150–400)
RBC: 3.72 MIL/uL — ABNORMAL LOW (ref 4.22–5.81)
RDW: 19.9 % — ABNORMAL HIGH (ref 11.5–15.5)
WBC: 13.9 10*3/uL — ABNORMAL HIGH (ref 4.0–10.5)
nRBC: 0.1 % (ref 0.0–0.2)

## 2019-08-22 LAB — TSH: TSH: 0.534 u[IU]/mL (ref 0.350–4.500)

## 2019-08-22 LAB — BASIC METABOLIC PANEL
Anion gap: 8 (ref 5–15)
BUN: 10 mg/dL (ref 8–23)
CO2: 22 mmol/L (ref 22–32)
Calcium: 8.2 mg/dL — ABNORMAL LOW (ref 8.9–10.3)
Chloride: 102 mmol/L (ref 98–111)
Creatinine, Ser: 0.94 mg/dL (ref 0.61–1.24)
GFR calc Af Amer: >60 mL/min (ref >60)
GFR calc non Af Amer: >60 mL/min (ref >60)
Glucose, Bld: 114 mg/dL — ABNORMAL HIGH (ref 70–99)
Potassium: 4 mmol/L (ref 3.5–5.1)
Sodium: 132 mmol/L — ABNORMAL LOW (ref 135–145)

## 2019-08-22 MED ORDER — FERROUS SULFATE 325 (65 FE) MG PO TABS
325.0000 mg | ORAL_TABLET | Freq: Three times a day (TID) | ORAL | Status: DC
  Administered 2019-08-22 – 2019-08-24 (×6): 325 mg via ORAL
  Filled 2019-08-22 (×6): qty 1

## 2019-08-22 NOTE — Progress Notes (Signed)
STROKE TEAM PROGRESS NOTE   INTERVAL HISTORY   No family at bedside, pt is awake with eyes open, tracking on the right, but still global aphasia and right hemiplegia. Able to have left gaze now but incomplete. On diet, calorie count pending.    Vitals:   08/21/19 2330 08/21/19 2342 08/22/19 0316 08/22/19 0358  BP: (!) 175/100 (!) 149/78 (!) 155/74   Pulse: 91 79 83   Resp: 19  (!) 22   Temp: 98.2 F (36.8 C)  98.4 F (36.9 C)   TempSrc: Oral  Oral   SpO2: 92% 98% 98%   Weight:    96.9 kg  Height:       CBC:  Recent Labs  Lab 08/17/19 1149 08/22/19 0324  WBC 13.0* 13.9*  HGB 8.8* 7.7*  HCT 30.5* 25.8*  MCV 70.8* 69.4*  PLT 418* 914*   Basic Metabolic Panel:  Recent Labs  Lab 08/17/19 1149 08/22/19 0324  NA 137 132*  K 4.3 4.0  CL 107 102  CO2 21* 22  GLUCOSE 122* 114*  BUN 15 10  CREATININE 0.87 0.94  CALCIUM 8.6* 8.2*   Lipid Panel:     Component Value Date/Time   CHOL 189 08/11/2019 0625   TRIG 64 08/11/2019 0625   HDL 49 08/11/2019 0625   CHOLHDL 3.9 08/11/2019 0625   VLDL 13 08/11/2019 0625   LDLCALC 127 (H) 08/11/2019 0625   HgbA1c:  Lab Results  Component Value Date   HGBA1C 5.4 08/11/2019   Urine Drug Screen:     Component Value Date/Time   LABOPIA NONE DETECTED 08/11/2019 0343   COCAINSCRNUR NONE DETECTED 08/11/2019 0343   LABBENZ NONE DETECTED 08/11/2019 0343   AMPHETMU NONE DETECTED 08/11/2019 0343   THCU NONE DETECTED 08/11/2019 0343   LABBARB NONE DETECTED 08/11/2019 0343    Alcohol Level     Component Value Date/Time   ETH 46 (H) 08/10/2019 1730    IMAGING past 24 hours  No results found.  PHYSICAL EXAM   Temp:  [98.2 F (36.8 C)-99 F (37.2 C)] 98.4 F (36.9 C) (06/13 0316) Pulse Rate:  [79-91] 83 (06/13 0316) Resp:  [18-22] 22 (06/13 0316) BP: (135-175)/(74-100) 155/74 (06/13 0316) SpO2:  [92 %-99 %] 98 % (06/13 0316) Weight:  [96.9 kg] 96.9 kg (06/13 0358)  General - well nourished, well developed elderly  African-American male, in no apparent distress.    Ophthalmologic - fundi not visualized due to noncooperation.    Cardiovascular - regular rhythm and rate    Neuro - awake with eyes open, able to track on the left but not on the right, not following simple commands, global aphasia. Blinking to visual threat on the left but not on the right, PERRL, right gaze incomplete. Right facial droop, tongue protrusion not cooperative. Right arm and leg flaccid. Left UE and LE spontaneous movement against gravity. DTR diminished on the right, no babinski. Sensation, coordination and gait not tested.   ASSESSMENT/PLAN [name] is a 74 y.o. male with history of HTN, DM, HLD, heavy alcohol who had sudden onset aphasia, L gaze preference and R hemiplegia. EKG showed AF en route. High BP treated with labetalol. Received tPA 08/10/2019 at 1749.  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 ASPECTS 10.     CTA head proximal to mid L M2 occlusion. Mild B ICA atherosclerosis.  CTA neck B ICA bifurcation and bulbs atherosclerosis. L VA origin atherosclerosis. 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 [place] recommended  Cerebral angio L M3 superior branch occlusion w/ subocclusive filling defect.    Post IR CT Minimal contrast extravasation L M3 branch  CT 6/2 progression of L MCA infarct, hypodensity L middle frontal gyrus extending into L parietal and L occipital lobe L sylvian fissure SAH.  MRI  Large evolving L MCA infarct w/ hemorrhagic transformation w/o discrete hematoma. Also L ACA infarct and ACA/PCA watershed territory. No significant mass effect.   MRA  Motion. Proximal L M2 branch occlusion   2D Echo EF 50-55%. No source of embolus. B atrial mildly dilated.  LE venous doppler no DVT  LDL 127  HgbA1c 5.4  SCDs for VTE prophylaxis  No antithrombotic prior to  admission, on ASA 325. Not on DAPT due to possible PEG placement, calorie count pending.    Therapy recommendations:  SNF recommended  Disposition:  pending   Hypertensive Emergency  SBP 211/133 on arrival  Home meds:  norvasc 10, lasix 20, hydralazine 100 q8, metoprolol 25 bid (pt not taking)  Labetalol prior to tPA  Off cleviprex gtt    Stable on the high end . SBP goal < 180 . Resume metoprolol 25 bid and amlodipine 10 . Long-term BP goal 130-150 given left M2 occlusion   Hyperlipidemia  Home meds:  lipitor 40  LDL 127, goal < 70  On lipitor 80   Resume statin at discharge   Diabetes type II, hyperglycemia   Home meds:  Glipizide 10, metformin 500 bid  HgbA1c 5.4, goal < 7.0  CBGs   SSI  PCP follow up  ?? Afib  EMS ECG showed probably afib  So far tele in hospital not in afib  Pt will need loop recorder to rule out afib if his neuro status improves  Dysphagia . Secondary to stroke . Speech on board . s/p cortrak, off TF now . Still on IVF @ 50 and add FW 200 Q4 . Now on dysphagia 1 diet and nectar thick liquid  . Calorie count pending . trauma consult for PEG if needed.   Fever, resolved Leukocytosis   Fever 100.7->101.3......->afebrile  WBC 12.2 ->11.7->14.5->17.2->14.1-13.2......13.3->13.9   UA large LE, 21-50 WBC, few bacteria.  UCx neg  CXR - 6/5 Cardiomegaly. Bibasilar atelectasis. Infection not excluded.  On Rocephin 6/3>>6/8  Iron deficiency anemia  Hb - 8.9->8.8->7.7  Low MCV and MCH  Iron penal pending  On iron pill tid  AKI on CKD IIIa  Cre 1.34->1.32->0.92 ->1.07 ->1.29.....0.87->0.94  Continue IVF, add FW  BMP monitoring  ETOH abuse  Alcohol level 46  As per family, he is heavy drinker  CIWA protocol - discontinued  B1 and FA and MVI  Other Stroke Risk Factors  Advanced age  Former cigarette smoker  Obesity, Body mass index is 30.65 kg/m., recommend weight loss, diet and exercise as  appropriate   Coronary artery disease - angina   Obstructive sleep apnea  Chronic diastolic Congestive heart failure  Dilated cardiomyopathy, LVH  Other Active Problems  Hx TBI  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 A dominant right thyroid nodule measures 3.5 cm. Nonemergent thyroid ultrasound is recommended for further evaluation. (by CTA 6/1)  Hyperkalemia 3.3->5.8->4.9 - resolved ->3.5 .....->4.3, resolved  Thrombocytosis - platelet 371->418->588  Hospital day # 12    Rosalin Hawking, MD PhD Stroke Neurology 08/22/2019 4:45 PM  To contact Stroke Continuity provider, please refer to [email or web address] After hours, contact General Neurology

## 2019-08-23 LAB — GLUCOSE, CAPILLARY
Glucose-Capillary: 102 mg/dL — ABNORMAL HIGH (ref 70–99)
Glucose-Capillary: 106 mg/dL — ABNORMAL HIGH (ref 70–99)
Glucose-Capillary: 141 mg/dL — ABNORMAL HIGH (ref 70–99)
Glucose-Capillary: 143 mg/dL — ABNORMAL HIGH (ref 70–99)
Glucose-Capillary: 177 mg/dL — ABNORMAL HIGH (ref 70–99)
Glucose-Capillary: 167 mg/dL — ABNORMAL HIGH (ref 70–99)

## 2019-08-23 LAB — IRON AND TIBC
Iron: 13 ug/dL — ABNORMAL LOW (ref 45–182)
Saturation Ratios: 4 % — ABNORMAL LOW (ref 17.9–39.5)
TIBC: 295 ug/dL (ref 250–450)
UIBC: 282 ug/dL

## 2019-08-23 LAB — SARS CORONAVIRUS 2 (TAT 6-24 HRS): SARS Coronavirus 2: NEGATIVE

## 2019-08-23 LAB — BASIC METABOLIC PANEL
Anion gap: 8 (ref 5–15)
BUN: 9 mg/dL (ref 8–23)
CO2: 22 mmol/L (ref 22–32)
Calcium: 8.4 mg/dL — ABNORMAL LOW (ref 8.9–10.3)
Chloride: 104 mmol/L (ref 98–111)
Creatinine, Ser: 0.98 mg/dL (ref 0.61–1.24)
GFR calc Af Amer: >60 mL/min (ref >60)
GFR calc non Af Amer: >60 mL/min (ref >60)
Glucose, Bld: 108 mg/dL — ABNORMAL HIGH (ref 70–99)
Potassium: 3.9 mmol/L (ref 3.5–5.1)
Sodium: 134 mmol/L — ABNORMAL LOW (ref 135–145)

## 2019-08-23 LAB — CBC
HCT: 28.4 % — ABNORMAL LOW (ref 39.0–52.0)
Hemoglobin: 8.3 g/dL — ABNORMAL LOW (ref 13.0–17.0)
MCH: 20.5 pg — ABNORMAL LOW (ref 26.0–34.0)
MCHC: 29.2 g/dL — ABNORMAL LOW (ref 30.0–36.0)
MCV: 70.3 fL — ABNORMAL LOW (ref 80.0–100.0)
Platelets: 714 10*3/uL — ABNORMAL HIGH (ref 150–400)
RBC: 4.04 MIL/uL — ABNORMAL LOW (ref 4.22–5.81)
RDW: 20.6 % — ABNORMAL HIGH (ref 11.5–15.5)
WBC: 13.2 10*3/uL — ABNORMAL HIGH (ref 4.0–10.5)
nRBC: 0.2 % (ref 0.0–0.2)

## 2019-08-23 LAB — FERRITIN: Ferritin: 25 ng/mL (ref 24–336)

## 2019-08-23 MED ORDER — AMLODIPINE BESYLATE 10 MG PO TABS
10.0000 mg | ORAL_TABLET | Freq: Every day | ORAL | Status: DC
  Administered 2019-08-24: 10 mg via ORAL
  Filled 2019-08-23: qty 1

## 2019-08-23 MED ORDER — ASPIRIN EC 325 MG PO TBEC
325.0000 mg | DELAYED_RELEASE_TABLET | Freq: Every day | ORAL | Status: DC
  Administered 2019-08-24: 325 mg via ORAL
  Filled 2019-08-23: qty 1

## 2019-08-23 MED ORDER — THIAMINE HCL 100 MG PO TABS
100.0000 mg | ORAL_TABLET | Freq: Every day | ORAL | Status: DC
  Administered 2019-08-24: 100 mg via ORAL
  Filled 2019-08-23: qty 1

## 2019-08-23 MED ORDER — CLOPIDOGREL BISULFATE 75 MG PO TABS
75.0000 mg | ORAL_TABLET | Freq: Every day | ORAL | Status: DC
  Administered 2019-08-23 – 2019-08-24 (×2): 75 mg via ORAL
  Filled 2019-08-23 (×2): qty 1

## 2019-08-23 MED ORDER — PROSIGHT PO TABS
1.0000 | ORAL_TABLET | Freq: Every day | ORAL | Status: DC
  Administered 2019-08-24: 1 via ORAL
  Filled 2019-08-23: qty 1

## 2019-08-23 MED ORDER — METOPROLOL TARTRATE 25 MG PO TABS
25.0000 mg | ORAL_TABLET | Freq: Two times a day (BID) | ORAL | Status: DC
  Administered 2019-08-23 – 2019-08-24 (×2): 25 mg via ORAL
  Filled 2019-08-23 (×2): qty 1

## 2019-08-23 MED ORDER — PANTOPRAZOLE SODIUM 40 MG PO TBEC
40.0000 mg | DELAYED_RELEASE_TABLET | Freq: Every day | ORAL | Status: DC
  Administered 2019-08-23 – 2019-08-24 (×2): 40 mg via ORAL
  Filled 2019-08-23 (×2): qty 1

## 2019-08-23 MED ORDER — ENSURE ENLIVE PO LIQD
237.0000 mL | Freq: Three times a day (TID) | ORAL | Status: DC
  Administered 2019-08-23 – 2019-08-24 (×4): 237 mL via ORAL

## 2019-08-23 NOTE — Progress Notes (Signed)
Nutrition Follow-up  DOCUMENTATION CODES:   Not applicable  INTERVENTION:  D/c Vital Cuisine shake po TID  Ensure Enlive po TID, each supplement provides 350 kcal and 20 grams of protein (thickened to nectar consistency)  Continue feeding assistance with all meals   Recommend returning pt to nocturnal feeding until decision has been made regarding PEG tube:  -Osmolite 1.5 cal @ 24ml/hr for 10 hours (between 2000-0600) -78ml Pro-stat BID -298ml free water Q4H  Nocturnal tube feeding regimen will provide 1175 kcal (meets 53% estimated needs), 70 grams of protein, 419ml free water (1696ml total free water with flushes)  NUTRITION DIAGNOSIS:   Inadequate oral intake related to inability to eat as evidenced by NPO status.  Progressing, pt now on Dysphagia 1 diet with Nectar thick liquids  GOAL:   Patient will meet greater than or equal to 90% of their needs  Progressing.   MONITOR:   Diet advancement, TF tolerance  REASON FOR ASSESSMENT:   Consult Enteral/tube feeding initiation and management  ASSESSMENT:   Pt with PMH of HTN, DM, HLD, CHF, and heavy ETOH use admitted with L large MCA infarct s/p tPA and IR for revascularization.  6/2 failed swallow eval  6/3 per MDLarge evolving L MCA infarct w/ hemorrhagic transformation.Also L ACA infarct and ACA/PCA watershed territory. 6/4 Cortrak placed, tip in stomach 6/8 dysphagia 1 diet with nectar thick liquids  Pt sleeping at time of RD visit.    Results of pt's calorie count were as follows:   Day 1 -- Total Intake: 1328 kcal (60% of minimum estimated needs)  59 protein (49% of minimum estimated needs)  Day 2 -- Total Intake: 1107 kcal (50% of minimum estimated needs)  39 grams protein (33% of minimum estimated needs)  Since the calorie count was completed, the patient's intake has been 0-75%. Per RN, pt refused breakfast today and has been refusing the Vital Cuisine shakes. Discussed pt's intake with MD. If  pt is not able to increase po intake, will likely need PEG tube. It looks as though pt has not been receiving nocturnal tube feeding since 6/11. Recommend re-initiating nocturnal tube feeding and adjusting oral nutrition supplement regimen until decision has been made regarding PEG tube placement.    UOP: 3,459ml x24 hours I/O: +1,368.51ml since admit  Labs: Na 134 (L) CBGs 102-106-141 Medications: Ferrous sulfate, folvite, MVI, Thiamine, Novolog, 271ml free water Q4H  Diet Order:   Diet Order            DIET - DYS 1 Room service appropriate? No; Fluid consistency: Nectar Thick  Diet effective now                 EDUCATION NEEDS:   No education needs have been identified at this time  Skin:  Skin Assessment: Skin Integrity Issues: Skin Integrity Issues:: Stage II Stage II: penis  Last BM:  6/8 type 7  Height:   Ht Readings from Last 1 Encounters:  08/14/19 5\' 10"  (1.778 m)    Weight:   Wt Readings from Last 1 Encounters:  08/23/19 96.5 kg   BMI:  Body mass index is 30.53 kg/m.  Estimated Nutritional Needs:   Kcal:  2200-2400  Protein:  120-140 grams  Fluid:  2 L/day    Larkin Ina, MS, RD, LDN RD pager number and weekend/on-call pager number located in [place].

## 2019-08-23 NOTE — Progress Notes (Signed)
STROKE TEAM PROGRESS NOTE   INTERVAL HISTORY   No family at the bedside.  Patient awake alert, laying in bed, still has left gaze, right neglect, right hemiplegia and global aphasia.  Patient did not eat breakfast due to grogginess, however ate 65% of lunch and near 100% for dinner.  Will DC cortrak tube and plan for SNF placement tomorrow.  Vitals:   08/22/19 2343 08/23/19 0332 08/23/19 0746 08/23/19 1152  BP: (!) 143/76 (!) 150/77 (!) 159/81 (!) 145/80  Pulse: 71 67 79 (!) 59  Resp: 18 18 18 20   Temp: 98.5 F (36.9 C) 98 F (36.7 C) 99.1 F (37.3 C) 98.1 F (36.7 C)  TempSrc: Oral Oral Oral   SpO2: 96% 98% 97% 95%  Weight:  96.5 kg    Height:       CBC:  Recent Labs  Lab 08/22/19 0324 08/23/19 0925  WBC 13.9* 13.2*  HGB 7.7* 8.3*  HCT 25.8* 28.4*  MCV 69.4* 70.3*  PLT 588* 937*   Basic Metabolic Panel:  Recent Labs  Lab 08/22/19 0324 08/23/19 0925  NA 132* 134*  K 4.0 3.9  CL 102 104  CO2 22 22  GLUCOSE 114* 108*  BUN 10 9  CREATININE 0.94 0.98  CALCIUM 8.2* 8.4*   Lipid Panel:     Component Value Date/Time   CHOL 189 08/11/2019 0625   TRIG 64 08/11/2019 0625   HDL 49 08/11/2019 0625   CHOLHDL 3.9 08/11/2019 0625   VLDL 13 08/11/2019 0625   LDLCALC 127 (H) 08/11/2019 0625   HgbA1c:  Lab Results  Component Value Date   HGBA1C 5.4 08/11/2019   Urine Drug Screen:     Component Value Date/Time   LABOPIA NONE DETECTED 08/11/2019 0343   COCAINSCRNUR NONE DETECTED 08/11/2019 0343   LABBENZ NONE DETECTED 08/11/2019 0343   AMPHETMU NONE DETECTED 08/11/2019 0343   THCU NONE DETECTED 08/11/2019 0343   LABBARB NONE DETECTED 08/11/2019 0343    Alcohol Level     Component Value Date/Time   ETH 46 (H) 08/10/2019 1730    IMAGING past 24 hours  No results found.  PHYSICAL EXAM  Temp:  [98 F (36.7 C)-99.1 F (37.3 C)] 98.1 F (36.7 C) (06/14 1152) Pulse Rate:  [59-79] 59 (06/14 1152) Resp:  [18-20] 20 (06/14 1152) BP: (143-159)/(65-81) 145/80  (06/14 1152) SpO2:  [95 %-98 %] 95 % (06/14 1152) Weight:  [96.5 kg] 96.5 kg (06/14 0332)  General - well nourished, well developed elderly African-American male, in no apparent distress.    Ophthalmologic - fundi not visualized due to noncooperation.    Cardiovascular - regular rhythm and rate    Neuro - awake with eyes open, able to track on the left but not on the right, not following simple commands, global aphasia. Blinking to visual threat on the left but not on the right, PERRL, right gaze incomplete. Right facial droop, tongue protrusion not cooperative. Right arm and leg flaccid. Left UE and LE spontaneous movement against gravity. DTR diminished on the right, no babinski. Sensation, coordination and gait not tested.   ASSESSMENT/PLAN [name] is a 74 y.o. male with history of HTN, DM, HLD, heavy alcohol who had sudden onset aphasia, L gaze preference and R hemiplegia. EKG showed AF en route. High BP treated with labetalol. Received tPA 08/10/2019 at 1749.  Stroke:   L MCA infarct s/p tPA and attempted IR L T0/W4 occlusion, embolic secondary to unknown source   Code Stroke CT  head No acute abnormality. ASPECTS 10.     CTA head proximal to mid L M2 occlusion. Mild B ICA atherosclerosis.  CTA neck B ICA bifurcation and bulbs atherosclerosis. L VA origin atherosclerosis. 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 [place] recommended  Cerebral angio L M3 superior branch occlusion w/ subocclusive filling defect.    Post IR CT Minimal contrast extravasation L M3 branch  CT 6/2 progression of L MCA infarct, hypodensity L middle frontal gyrus extending into L parietal and L occipital lobe L sylvian fissure SAH.  MRI  Large evolving L MCA infarct w/ hemorrhagic transformation w/o discrete hematoma. Also L ACA infarct and ACA/PCA watershed territory. No significant mass effect.   MRA  Motion. Proximal L M2 branch occlusion   2D Echo EF 50-55%. No source of  embolus. B atrial mildly dilated.  LE venous doppler no DVT  May consider loop recorder if further improves at next neuro clinic visit.   LDL 127  HgbA1c 5.4  SCDs for VTE prophylaxis  No antithrombotic prior to admission, on ASA 325 and plavix DAPT for 3 months and then ASA alone.    Therapy recommendations:  SNF   Disposition:  pending   Hypertensive Emergency  SBP 211/133 on arrival  Home meds:  norvasc 10, lasix 20, hydralazine 100 q8, metoprolol 25 bid (pt not taking)  Labetalol prior to tPA  Off cleviprex gtt    Stable on the high end . SBP goal < 180 . On metoprolol 25 bid and amlodipine 10 . Long-term BP goal 130-150 given left M2 occlusion   Hyperlipidemia  Home meds:  lipitor 40  LDL 127, goal < 70  On lipitor 80   Resume statin at discharge   Diabetes type II, hyperglycemia   Home meds:  Glipizide 10, metformin 500 bid  HgbA1c 5.4, goal < 7.0  CBGs   SSI  PCP follow up  Dysphagia . Secondary to stroke . Speech on board . s/p cortrak -> d/c 6/14 . On IVF @ 73  . Now on dysphagia 1 diet and nectar thick liquid  . Pt has been eating adequate . Ensure enlive added.   Fever, resolved Leukocytosis   afebrile  WBC 12.2 ->11.7......13.3->13.9->13.2  UA large LE, 21-50 WBC, few bacteria.  UCx neg  CXR - 6/5 Cardiomegaly. Bibasilar atelectasis. Infection not excluded.  On Rocephin 6/3>>6/8  Iron deficiency anemia  Hb - 8.9->8.8->7.7->8.3  Low MCV and MCH  Iron penal Fe 13 (L)m TIBC 295, Sat 4 (L), UIBC 282, Fer 25  On iron pill tid  AKI on CKD IIIa  Cre 1.34.....0.87->0.94->0.98  Continue IVF  Encourage po intake  BMP monitoring  ETOH abuse  Alcohol level 46  As per family, he is heavy drinker  CIWA protocol - discontinued  B1 and FA and MVI  Other Stroke Risk Factors  Advanced age  Former cigarette smoker  Obesity, Body mass index is 30.53 kg/m., recommend weight loss, diet and exercise as  appropriate   Coronary artery disease - angina   Obstructive sleep apnea  Chronic diastolic Congestive heart failure  Dilated cardiomyopathy, LVH  Other Active Problems  Hx TBI  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 A dominant right thyroid nodule measures 3.5 cm. Nonemergent thyroid ultrasound is recommended for further evaluation. (by CTA 6/1)  Hyperkalemia - resolved  Thrombocytosis - platelet 588->714  Hospital day # 13    Rosalin Hawking, MD PhD Stroke Neurology 08/23/2019 2:00 PM  To contact Stroke Continuity provider, please refer to [email or web address] After hours, contact General Neurology

## 2019-08-23 NOTE — Progress Notes (Signed)
Physical Therapy Treatment Patient Details Name: Malik [name] MRN: [id number] DOB: 1945/04/08 Today's Date: 08/23/2019    History of Present Illness This 74 y.o. male admitted with sudden onset of aphasia, Lt gaze preference, and Rt hemiplegia.   He was found to have Lt MCA infarct with proximal to mid M2/M3 occlusion.  He is s/p tPA and thrombectomy.  Repeast CT showed significant progression of Lt MCA infarct with hypodensity in teh Lt middle frontal gyrus extending into the Lt parietal and Lt occipital lobe.  Linear hyperdensity in the Lt sylvian fissure likely represents small SAH.  Repeat MRI showed large Lt evolving MCA infarct with evidence of hemorrhagic conversion as well as some watershed involvement in the ACA, PCA, and LT ACA.   PMH includes:  TBI, neuropathy, LVH, HTN, Dilated cardiomyopathy, DM, chronic diastolic CHF.  ETOH abuse.     PT Comments    Patient seen for mobility progression. This session focused on bed mobility and sitting balance EOB. Pt requires +2 assist for safety with mobility given R hemiplegia and impaired cognition/communication. Continue to progress as tolerated with anticipated d/c to SNF for further skilled PT services.     Follow Up Recommendations  SNF     Equipment Recommendations  None recommended by PT    Recommendations for Other Services       Precautions / Restrictions Precautions Precautions: Fall Precaution Comments: Lt pusher, cortrak    Mobility  Bed Mobility Overal bed mobility: Needs Assistance Bed Mobility: Supine to Sit;Sit to Supine     Supine to sit: HOB elevated;Max assist Sit to supine: Max assist;+2 for physical assistance;+2 for safety/equipment   General bed mobility comments: assist to bring R LE/hips to EOB and to elevate trunk into sitting; use of bed pad to scoot hips; pt assisted with L UE to come into sitting and able to bring L LE over EOB with verbal and tactile cues   Transfers                  General transfer comment: attempted X 2 to stand however pt not assisting despite multimodal cues  Ambulation/Gait                 Stairs             Wheelchair Mobility    Modified Rankin (Stroke Patients Only) Modified Rankin (Stroke Patients Only) Pre-Morbid Rankin Score: No significant disability (unsure of baseline) Modified Rankin: Severe disability     Balance Overall balance assessment: Needs assistance Sitting-balance support: Feet supported;Bilateral upper extremity supported Sitting balance-Leahy Scale: Poor Sitting balance - Comments: pt initially required max A to maintain sitting balance (+2 for pt/therapist safety) but able to progress to min A with L UE supported and cues for weight shifting Postural control: Right lateral lean                                  Cognition Arousal/Alertness: Awake/alert Behavior During Therapy: Flat affect Overall Cognitive Status: Impaired/Different from baseline Area of Impairment: Attention;Problem solving;Safety/judgement;Following commands                   Current Attention Level: Focused   Following Commands: Follows one step commands inconsistently;Follows one step commands with increased time Safety/Judgement: Decreased awareness of safety   Problem Solving: Slow processing;Decreased initiation;Requires verbal cues;Requires tactile cues General Comments: increased time/repetition/multimodal cues needed for mobility tasks; pt waved "bye"  when therapist leaving; L gaze preference and did not track past midline       Exercises      General Comments        Pertinent Vitals/Pain Pain Assessment: Faces Faces Pain Scale: Hurts little more Pain Location: grimacing when repositioning R LE; unable to specify source of pain Pain Descriptors / Indicators: Grimacing Pain Intervention(s): Limited activity within patient's tolerance;Monitored during session;Repositioned    Home Living                       Prior Function            PT Goals (current goals can now be found in the care plan section) Acute Rehab PT Goals Patient Stated Goal: patient unable to state, no family present Progress towards PT goals: Progressing toward goals    Frequency    Min 3X/week      PT Plan Current plan remains appropriate    Co-evaluation              AM-PAC PT "6 Clicks" Mobility   Outcome Measure  Help needed turning from your back to your side while in a flat bed without using bedrails?: A Lot Help needed moving from lying on your back to sitting on the side of a flat bed without using bedrails?: Total Help needed moving to and from a bed to a chair (including a wheelchair)?: Total Help needed standing up from a chair using your arms (e.g., wheelchair or bedside chair)?: Total Help needed to walk in hospital room?: Total Help needed climbing 3-5 steps with a railing? : Total 6 Click Score: 7    End of Session   Activity Tolerance: Patient tolerated treatment well Patient left: in bed;with call bell/phone within reach;with bed alarm set;Other (comment) (pt in chair position) Nurse Communication: Mobility status;Need for lift equipment PT Visit Diagnosis: Other abnormalities of gait and mobility (R26.89);Hemiplegia and hemiparesis;Muscle weakness (generalized) (M62.81) Hemiplegia - Right/Left: Right Hemiplegia - dominant/non-dominant: Dominant Hemiplegia - caused by: Cerebral infarction     Time: 1200-1220 PT Time Calculation (min) (ACUTE ONLY): 20 min  Charges:  $Neuromuscular Re-education: 8-22 mins                     Earney Navy, PTA Acute Rehabilitation Services Pager: [phone] Office: [phone]     Darliss Cheney 08/23/2019, 12:48 PM

## 2019-08-23 NOTE — TOC Progression Note (Signed)
Transition of Care [facility]) - Progression Note    Patient Details  Name: Grainger Mccarley MRN: [id number] Date of Birth: Oct 31, 1945  [facility] CM/SW Contact  Pollie Friar, RN Phone Number: 08/23/2019, 1:06 PM  Clinical Narrative:    CM spoke to [facility] health about changing the SNF auth to [facility] per family request. Olivia Mackie with [facility] has offered a bed. [facility] requesting additional clinicals. CM has faxed those to [facility].  TOC following.   Expected Discharge Plan: [place] Barriers to Discharge: Continued Medical Work up  Expected Discharge Plan and Services Expected Discharge Plan: [place] In-house Referral: Clinical Social Work Discharge Planning Services: CM Consult Post Acute Care Choice: [place] arrangements for the past 2 months: Single Family Home                                       Social Determinants of Health (SDOH) Interventions    Readmission Risk Interventions No flowsheet data found.

## 2019-08-24 DIAGNOSIS — E119 Type 2 diabetes mellitus without complications: Secondary | ICD-10-CM

## 2019-08-24 DIAGNOSIS — E43 Unspecified severe protein-calorie malnutrition: Secondary | ICD-10-CM | POA: Diagnosis present

## 2019-08-24 DIAGNOSIS — N179 Acute kidney failure, unspecified: Secondary | ICD-10-CM

## 2019-08-24 DIAGNOSIS — D508 Other iron deficiency anemias: Secondary | ICD-10-CM

## 2019-08-24 DIAGNOSIS — E042 Nontoxic multinodular goiter: Secondary | ICD-10-CM | POA: Diagnosis present

## 2019-08-24 DIAGNOSIS — I10 Essential (primary) hypertension: Secondary | ICD-10-CM

## 2019-08-24 DIAGNOSIS — I5032 Chronic diastolic (congestive) heart failure: Secondary | ICD-10-CM

## 2019-08-24 DIAGNOSIS — I63511 Cerebral infarction due to unspecified occlusion or stenosis of right middle cerebral artery: Secondary | ICD-10-CM

## 2019-08-24 DIAGNOSIS — D509 Iron deficiency anemia, unspecified: Secondary | ICD-10-CM | POA: Diagnosis present

## 2019-08-24 DIAGNOSIS — E785 Hyperlipidemia, unspecified: Secondary | ICD-10-CM | POA: Diagnosis present

## 2019-08-24 DIAGNOSIS — E669 Obesity, unspecified: Secondary | ICD-10-CM | POA: Diagnosis present

## 2019-08-24 DIAGNOSIS — N183 Chronic kidney disease, stage 3 unspecified: Secondary | ICD-10-CM | POA: Diagnosis present

## 2019-08-24 DIAGNOSIS — F1010 Alcohol abuse, uncomplicated: Secondary | ICD-10-CM | POA: Diagnosis present

## 2019-08-24 DIAGNOSIS — N1832 Chronic kidney disease, stage 3b: Secondary | ICD-10-CM

## 2019-08-24 DIAGNOSIS — I69391 Dysphagia following cerebral infarction: Secondary | ICD-10-CM

## 2019-08-24 LAB — BASIC METABOLIC PANEL
Anion gap: 9 (ref 5–15)
BUN: 14 mg/dL (ref 8–23)
CO2: 21 mmol/L — ABNORMAL LOW (ref 22–32)
Calcium: 8.3 mg/dL — ABNORMAL LOW (ref 8.9–10.3)
Chloride: 105 mmol/L (ref 98–111)
Creatinine, Ser: 1.02 mg/dL (ref 0.61–1.24)
GFR calc Af Amer: >60 mL/min (ref >60)
GFR calc non Af Amer: >60 mL/min (ref >60)
Glucose, Bld: 119 mg/dL — ABNORMAL HIGH (ref 70–99)
Potassium: 3.8 mmol/L (ref 3.5–5.1)
Sodium: 135 mmol/L (ref 135–145)

## 2019-08-24 LAB — GLUCOSE, CAPILLARY
Glucose-Capillary: 111 mg/dL — ABNORMAL HIGH (ref 70–99)
Glucose-Capillary: 117 mg/dL — ABNORMAL HIGH (ref 70–99)
Glucose-Capillary: 189 mg/dL — ABNORMAL HIGH (ref 70–99)

## 2019-08-24 LAB — CBC
HCT: 26.5 % — ABNORMAL LOW (ref 39.0–52.0)
Hemoglobin: 7.9 g/dL — ABNORMAL LOW (ref 13.0–17.0)
MCH: 20.7 pg — ABNORMAL LOW (ref 26.0–34.0)
MCHC: 29.8 g/dL — ABNORMAL LOW (ref 30.0–36.0)
MCV: 69.6 fL — ABNORMAL LOW (ref 80.0–100.0)
Platelets: 647 10*3/uL — ABNORMAL HIGH (ref 150–400)
RBC: 3.81 MIL/uL — ABNORMAL LOW (ref 4.22–5.81)
RDW: 20.3 % — ABNORMAL HIGH (ref 11.5–15.5)
WBC: 14.6 10*3/uL — ABNORMAL HIGH (ref 4.0–10.5)
nRBC: 0.2 % (ref 0.0–0.2)

## 2019-08-24 MED ORDER — ATORVASTATIN CALCIUM 80 MG PO TABS: 80.0000 mg | ORAL_TABLET | Freq: Every day | ORAL | Status: DC

## 2019-08-24 MED ORDER — FERROUS SULFATE 325 (65 FE) MG PO TABS: 325.0000 mg | ORAL_TABLET | Freq: Three times a day (TID) | ORAL | 3 refills | Status: DC

## 2019-08-24 MED ORDER — RESOURCE THICKENUP CLEAR PO POWD: 1.0000 | ORAL | Status: AC | PRN

## 2019-08-24 MED ORDER — ASPIRIN 325 MG PO TBEC: 325.0000 mg | DELAYED_RELEASE_TABLET | Freq: Every day | ORAL | 0 refills | Status: DC

## 2019-08-24 MED ORDER — SILVER SULFADIAZINE 1 % EX CREA: TOPICAL_CREAM | Freq: Every day | CUTANEOUS | 0 refills | Status: DC

## 2019-08-24 MED ORDER — CLOPIDOGREL BISULFATE 75 MG PO TABS: 75.0000 mg | ORAL_TABLET | Freq: Every day | ORAL | Status: AC

## 2019-08-24 MED ORDER — PROSIGHT PO TABS: 1.0000 | ORAL_TABLET | Freq: Every day | ORAL | 0 refills | Status: DC

## 2019-08-24 MED ORDER — METOPROLOL TARTRATE 25 MG PO TABS: 25.0000 mg | ORAL_TABLET | Freq: Two times a day (BID) | ORAL | Status: DC

## 2019-08-24 MED ORDER — ENSURE ENLIVE PO LIQD: 237.0000 mL | Freq: Three times a day (TID) | ORAL | 12 refills | Status: AC

## 2019-08-24 NOTE — Discharge Summary (Addendum)
Stroke Discharge Summary  Patient ID: Malik [name]   MRN: [id number]      DOB: Apr 28, 1945  Date of Admission: 08/10/2019 Date of Discharge: 08/24/2019  Attending Physician:  Rosalin Hawking, MD, Stroke MD Consultant(s):    Wound Ostomy Continence RN,  Trauma/Surgical Team (PEG consideration) Patient's PCP:  Donnie Coffin, MD  DISCHARGE DIAGNOSIS:  Principal Problem:   Embolic stroke ([place]) L MCA s/p tPA, unknown source Active Problems:   TBI (traumatic brain injury) ([place])   Chronic diastolic congestive heart failure (HCC)   Leukocytosis   Benign essential HTN   Diabetes mellitus type 2 in nonobese ([place])   Hypertensive crisis   Pressure injury of skin   Hyperlipidemia LDL goal <70   Dysphagia due to recent cerebral infarction   Severe protein-calorie malnutrition (HCC)   Iron deficiency anemia   AKI (acute kidney injury) ([place])   CKD (chronic kidney disease), stage IIIa   Alcohol abuse   Obesity   Multinodular thyroid   Allergies as of 08/24/2019   No Known Allergies     Medication List    STOP taking these medications   acetaminophen 325 MG tablet Commonly known as: TYLENOL   ascorbic acid 250 MG tablet Commonly known as: VITAMIN C   calcium-vitamin D 500-200 MG-UNIT tablet Commonly known as: OSCAL WITH D   furosemide 20 MG tablet Commonly known as: LASIX   glipiZIDE 10 MG 24 hr tablet Commonly known as: GLUCOTROL XL   hydrALAZINE 100 MG tablet Commonly known as: APRESOLINE   metFORMIN 500 MG tablet Commonly known as: GLUCOPHAGE   methocarbamol 500 MG tablet Commonly known as: ROBAXIN   multivitamins with iron Tabs tablet   polyethylene glycol 17 g packet Commonly known as: MIRALAX / GLYCOLAX   traMADol 50 MG tablet Commonly known as: ULTRAM   traZODone 50 MG tablet Commonly known as: DESYREL     TAKE these medications   amLODipine 10 MG tablet Commonly known as: NORVASC Take 1 tablet (10 mg total) by mouth daily.   aspirin 325 MG EC  tablet Take 1 tablet (325 mg total) by mouth daily. Start taking on: August 25, 2019   atorvastatin 80 MG tablet Commonly known as: LIPITOR Take 1 tablet (80 mg total) by mouth daily. Start taking on: August 25, 2019 What changed:   medication strength  how much to take   clopidogrel 75 MG tablet Commonly known as: PLAVIX Take 1 tablet (75 mg total) by mouth daily. STOP TAKING AFTER 3 MONTHS AND CONTINUE ASPIRIN ALONE Start taking on: August 25, 2019   feeding supplement (ENSURE ENLIVE) Liqd Take 237 mLs by mouth 3 (three) times daily between meals.   ferrous sulfate 325 (65 FE) MG tablet Take 1 tablet (325 mg total) by mouth 3 (three) times daily with meals. What changed: when to take this   folic acid 1 MG tablet Commonly known as: FOLVITE Take 1 tablet (1 mg total) by mouth daily.   metoprolol tartrate 25 MG tablet Commonly known as: LOPRESSOR Take 1 tablet (25 mg total) by mouth 2 (two) times daily.   multivitamin Tabs tablet Take 1 tablet by mouth daily. Start taking on: August 25, 2019   pantoprazole 40 MG tablet Commonly known as: PROTONIX Take 1 tablet (40 mg total) by mouth daily.   Resource ThickenUp Clear Powd Take 120 g by mouth as needed (for nectar thick liquids).   silver sulfADIAZINE 1 % cream Commonly known as: SILVADENE Apply topically  daily. Start taking on: August 25, 2019   thiamine 100 MG tablet Take 1 tablet (100 mg total) by mouth daily.            Discharge Care Instructions  (From admission, onward)         Start     Ordered   08/24/19 0000  Change dressing (specify)       Comments: Dressing change: to penis daily, apply silvadene until healed   08/24/19 1403          LABORATORY STUDIES CBC    Component Value Date/Time   WBC 14.6 (H) 08/24/2019 0415   RBC 3.81 (L) 08/24/2019 0415   HGB 7.9 (L) 08/24/2019 0415   HGB 13.2 06/11/2011 2306   HCT 26.5 (L) 08/24/2019 0415   HCT 39.4 (L) 06/11/2011 2306   PLT 647 (H)  08/24/2019 0415   PLT 193 06/11/2011 2306   MCV 69.6 (L) 08/24/2019 0415   MCV 89 06/11/2011 2306   MCH 20.7 (L) 08/24/2019 0415   MCHC 29.8 (L) 08/24/2019 0415   RDW 20.3 (H) 08/24/2019 0415   RDW 14.2 06/11/2011 2306   LYMPHSABS 4.8 (H) 08/10/2019 1730   LYMPHSABS 1.6 06/11/2011 2306   MONOABS 0.5 08/10/2019 1730   MONOABS 1.0 (H) 06/11/2011 2306   EOSABS 0.5 08/10/2019 1730   EOSABS 0.4 06/11/2011 2306   BASOSABS 0.4 (H) 08/10/2019 1730   BASOSABS 0.0 06/11/2011 2306   CMP    Component Value Date/Time   NA 135 08/24/2019 0415   NA 142 06/11/2011 2306   K 3.8 08/24/2019 0415   K 3.9 06/11/2011 2306   CL 105 08/24/2019 0415   CL 108 (H) 06/11/2011 2306   CO2 21 (L) 08/24/2019 0415   CO2 24 06/11/2011 2306   GLUCOSE 119 (H) 08/24/2019 0415   GLUCOSE 130 (H) 06/11/2011 2306   BUN 14 08/24/2019 0415   BUN 16 06/11/2011 2306   CREATININE 1.02 08/24/2019 0415   CREATININE 1.30 06/11/2011 2306   CALCIUM 8.3 (L) 08/24/2019 0415   CALCIUM 8.4 (L) 06/11/2011 2306   PROT 7.3 08/10/2019 1730   ALBUMIN 3.3 (L) 08/10/2019 1730   AST 28 08/10/2019 1730   ALT 23 08/10/2019 1730   ALKPHOS 80 08/10/2019 1730   BILITOT 0.8 08/10/2019 1730   GFRNONAA >60 08/24/2019 0415   GFRNONAA 59 (L) 06/11/2011 2306   GFRAA >60 08/24/2019 0415   GFRAA >60 06/11/2011 2306   COAGS Lab Results  Component Value Date   INR 1.1 08/10/2019   INR 1.08 05/10/2017   INR 1.0 09/05/2008   Lipid Panel    Component Value Date/Time   CHOL 189 08/11/2019 0625   TRIG 64 08/11/2019 0625   HDL 49 08/11/2019 0625   CHOLHDL 3.9 08/11/2019 0625   VLDL 13 08/11/2019 0625   LDLCALC 127 (H) 08/11/2019 0625   HgbA1C  Lab Results  Component Value Date   HGBA1C 5.4 08/11/2019   Urinalysis    Component Value Date/Time   COLORURINE STRAW (A) 08/11/2019 [place] 08/11/2019 0343   APPEARANCEUR Clear 06/11/2011 2046   LABSPEC 1.023 08/11/2019 0343   LABSPEC 1.025 06/11/2011 2046    PHURINE 6.0 08/11/2019 0343   GLUCOSEU >=500 (A) 08/11/2019 0343   GLUCOSEU Negative 06/11/2011 2046   HGBUR SMALL (A) 08/11/2019 0343   BILIRUBINUR NEGATIVE 08/11/2019 0343   BILIRUBINUR Negative 06/11/2011 2046   KETONESUR NEGATIVE 08/11/2019 0343   PROTEINUR 30 (A) 08/11/2019 0343   NITRITE  NEGATIVE 08/11/2019 0343   LEUKOCYTESUR LARGE (A) 08/11/2019 0343   LEUKOCYTESUR Negative 06/11/2011 2046   Urine Drug Screen     Component Value Date/Time   LABOPIA NONE DETECTED 08/11/2019 0343   COCAINSCRNUR NONE DETECTED 08/11/2019 0343   LABBENZ NONE DETECTED 08/11/2019 0343   AMPHETMU NONE DETECTED 08/11/2019 0343   THCU NONE DETECTED 08/11/2019 0343   LABBARB NONE DETECTED 08/11/2019 0343    Alcohol Level    Component Value Date/Time   ETH 46 (H) 08/10/2019 1730    SIGNIFICANT DIAGNOSTIC STUDIES CT Code Stroke CTA Head W/WO contrast  Result Date: 08/10/2019 CLINICAL DATA:  Stroke, follow-up EXAM: CT ANGIOGRAPHY HEAD AND NECK TECHNIQUE: Multidetector CT imaging of the head and neck was performed using the standard protocol during bolus administration of intravenous contrast. Multiplanar CT image reconstructions and MIPs were obtained to evaluate the vascular anatomy. Carotid stenosis measurements (when applicable) are obtained utilizing NASCET criteria, using the distal internal carotid diameter as the denominator. CONTRAST:  78m OMNIPAQUE IOHEXOL 350 MG/ML SOLN COMPARISON:  Non-contrast head CT performed earlier the same day 05/10/2017, chest CT 05/10/2017. FINDINGS: CTA NECK FINDINGS Elevation is somewhat limited due to contrast bolus timing. Aortic arch: Standard aortic branching. Atherosclerotic calcification within the visualized aortic arch and proximal major branch vessels of the neck. No hemodynamically significant innominate or proximal subclavian artery stenosis. Right carotid system: Streak artifact from a dense right-sided contrast bolus limits evaluation of the proximal right  CCA. Within this limitation, the CCA and ICA are patent within the neck without significant stenosis (50% or greater). Mild calcified plaque within the carotid bifurcation and carotid bulb. Left carotid system: CCA and ICA patent within the neck without significant stenosis (50% or greater). Mild to moderate mixed plaque within the carotid bifurcation and carotid bulb. Vertebral arteries: Codominant and patent within the neck bilaterally without significant stenosis. Mild calcified plaque at the origin of the left vertebral artery. Skeleton: No acute bony abnormality or aggressive osseous lesion. Reversal of the expected cervical lordosis. Cervical spondylosis with multilevel disc space narrowing, posterior disc osteophyte complexes, uncovertebral and facet hypertrophy. Other neck: Heterogeneous enlarged thyroid gland extending into the superior mediastinum with multiple nodules and calcifications. A dominant right thyroid lobe nodule measures 3.5 cm (series 6, image 58). Upper chest: Emphysema. No consolidation within the imaged lung apices. Smooth interlobular septal thickening within the imaged lung apices which is nonspecific, but may reflect edema. Review of the MIP images confirms the above findings CTA HEAD FINDINGS Anterior circulation: Intracranial internal carotid arteries are patent bilaterally. Calcified plaque within these vessels bilaterally with no more than mild stenosis. The M1 middle cerebral arteries are patent without significant stenosis. There is abrupt occlusion of a proximal to mid left M2 MCA branch vessel (series 16, image 31) (series 17, image 71). The anterior cerebral arteries are patent bilaterally without significant proximal stenosis. No intracranial aneurysm is identified. Posterior circulation: The intracranial vertebral arteries are patent without significant stenosis, as is the basilar artery. The posterior cerebral arteries are patent proximally without significant stenosis.  Posterior communicating arteries are hypoplastic or absent bilaterally. Venous sinuses: Within limitations of contrast timing, no convincing thrombus. Hypoplastic left transverse and sigmoid dural venous sinuses. Anatomic variants: As described Review of the MIP images confirms the above findings These results were communicated to Dr. XErlinda HongAt 6:23 pmon 6/1/2021by text page via the [facility]St[facility] Campusmessaging system. IMPRESSION: CTA neck: 1. The examination is somewhat limited due to poor contrast bolus timing at the level of  the neck. 2. With streak artifact from a dense right-sided contrast bolus limits evaluation of the proximal right CCA. Within this limitation, the bilateral common and internal carotid arteries are patent within the neck without significant stenosis. Atherosclerotic plaque within the carotid bifurcations and ICA bulbs bilaterally. 3. The vertebral arteries are patent within the neck bilaterally without significant stenosis. Mild calcified plaque at the origin of the left vertebral artery. 4. 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 A dominant right thyroid nodule measures 3.5 cm. Nonemergent thyroid ultrasound is recommended for further evaluation. CTA head: 1. Abrupt occlusion of a proximal to mid M2 left MCA branch vessel. 2. No other intracranial large vessel occlusion or proximal high-grade arterial stenosis is identified. 3. Mild atherosclerotic disease within the intracranial ICAs bilaterally. Electronically Signed   By: Kellie Simmering DO   On: 08/10/2019 18:38   CT HEAD WO CONTRAST  Result Date: 08/11/2019 CLINICAL DATA:  Stroke.  Left MCA thrombectomy yesterday. EXAM: CT HEAD WITHOUT CONTRAST TECHNIQUE: Contiguous axial images were obtained from the base of the skull through the vertex without intravenous contrast. COMPARISON:  CT head 08/10/2019 FINDINGS: Brain: Progressive infarction is now present in the left MCA territory. Infarct extends into the left parietal  lobe and left occipital lobe. Linear density in the anterior sylvian fissure on the left likely represents a small amount of subarachnoid hemorrhage. No midline shift. Chronic infarct right cerebellum is small. Small chronic infarct right internal capsule. Ventricle size is normal. Vascular: Linear hyperdensity in the left anterior sylvian fissure may represent thrombus in a superior division of the left MCA versus a small amount of hemorrhage. Skull: Negative Sinuses/Orbits: Mucosal edema paranasal sinuses.  Negative orbit Other: None IMPRESSION: 1. There has been significant progression of left MCA infarct. There is hypodensity in the left middle frontal gyrus extending into the left parietal lobe and left occipital lobe. 2. Linear hyperdensity in the left sylvian fissure likely resent represents a small amount of subarachnoid hemorrhage. Electronically Signed   By: Franchot Gallo M.D.   On: 08/11/2019 08:47   CT Code Stroke CTA Neck W/WO contrast  Result Date: 08/10/2019 CLINICAL DATA:  Stroke, follow-up EXAM: CT ANGIOGRAPHY HEAD AND NECK TECHNIQUE: Multidetector CT imaging of the head and neck was performed using the standard protocol during bolus administration of intravenous contrast. Multiplanar CT image reconstructions and MIPs were obtained to evaluate the vascular anatomy. Carotid stenosis measurements (when applicable) are obtained utilizing NASCET criteria, using the distal internal carotid diameter as the denominator. CONTRAST:  74m OMNIPAQUE IOHEXOL 350 MG/ML SOLN COMPARISON:  Non-contrast head CT performed earlier the same day 05/10/2017, chest CT 05/10/2017. FINDINGS: CTA NECK FINDINGS Elevation is somewhat limited due to contrast bolus timing. Aortic arch: Standard aortic branching. Atherosclerotic calcification within the visualized aortic arch and proximal major branch vessels of the neck. No hemodynamically significant innominate or proximal subclavian artery stenosis. Right carotid system:  Streak artifact from a dense right-sided contrast bolus limits evaluation of the proximal right CCA. Within this limitation, the CCA and ICA are patent within the neck without significant stenosis (50% or greater). Mild calcified plaque within the carotid bifurcation and carotid bulb. Left carotid system: CCA and ICA patent within the neck without significant stenosis (50% or greater). Mild to moderate mixed plaque within the carotid bifurcation and carotid bulb. Vertebral arteries: Codominant and patent within the neck bilaterally without significant stenosis. Mild calcified plaque at the origin of the left vertebral artery. Skeleton: No acute bony abnormality  or aggressive osseous lesion. Reversal of the expected cervical lordosis. Cervical spondylosis with multilevel disc space narrowing, posterior disc osteophyte complexes, uncovertebral and facet hypertrophy. Other neck: Heterogeneous enlarged thyroid gland extending into the superior mediastinum with multiple nodules and calcifications. A dominant right thyroid lobe nodule measures 3.5 cm (series 6, image 58). Upper chest: Emphysema. No consolidation within the imaged lung apices. Smooth interlobular septal thickening within the imaged lung apices which is nonspecific, but may reflect edema. Review of the MIP images confirms the above findings CTA HEAD FINDINGS Anterior circulation: Intracranial internal carotid arteries are patent bilaterally. Calcified plaque within these vessels bilaterally with no more than mild stenosis. The M1 middle cerebral arteries are patent without significant stenosis. There is abrupt occlusion of a proximal to mid left M2 MCA branch vessel (series 16, image 31) (series 17, image 71). The anterior cerebral arteries are patent bilaterally without significant proximal stenosis. No intracranial aneurysm is identified. Posterior circulation: The intracranial vertebral arteries are patent without significant stenosis, as is the basilar  artery. The posterior cerebral arteries are patent proximally without significant stenosis. Posterior communicating arteries are hypoplastic or absent bilaterally. Venous sinuses: Within limitations of contrast timing, no convincing thrombus. Hypoplastic left transverse and sigmoid dural venous sinuses. Anatomic variants: As described Review of the MIP images confirms the above findings These results were communicated to Dr. Erlinda Hong At 6:23 pmon 6/1/2021by text page via the [facility] messaging system. IMPRESSION: CTA neck: 1. The examination is somewhat limited due to poor contrast bolus timing at the level of the neck. 2. With streak artifact from a dense right-sided contrast bolus limits evaluation of the proximal right CCA. Within this limitation, the bilateral common and internal carotid arteries are patent within the neck without significant stenosis. Atherosclerotic plaque within the carotid bifurcations and ICA bulbs bilaterally. 3. The vertebral arteries are patent within the neck bilaterally without significant stenosis. Mild calcified plaque at the origin of the left vertebral artery. 4. 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 A dominant right thyroid nodule measures 3.5 cm. Nonemergent thyroid ultrasound is recommended for further evaluation. CTA head: 1. Abrupt occlusion of a proximal to mid M2 left MCA branch vessel. 2. No other intracranial large vessel occlusion or proximal high-grade arterial stenosis is identified. 3. Mild atherosclerotic disease within the intracranial ICAs bilaterally. Electronically Signed   By: Kellie Simmering DO   On: 08/10/2019 18:38   MR ANGIO HEAD WO CONTRAST  Result Date: 08/11/2019 CLINICAL DATA:  Stroke post thrombectomy, follow-up EXAM: MRI HEAD WITHOUT CONTRAST MRA HEAD WITHOUT CONTRAST TECHNIQUE: Multiplanar, multiecho pulse sequences of the brain and surrounding structures were obtained without intravenous contrast. Angiographic images of  the head were obtained using MRA technique without contrast. COMPARISON:  Correlation made with prior CT imaging FINDINGS: MRI HEAD Brain: Large evolving acute left MCA territory infarction involving frontoparietal greater than temporal lobes as well as the anterior insula. There is also some left MCA/ACA and MCA/PCA watershed involvement as well as left ACA involvement. Areas of susceptibility likely reflecting petechial reperfusion hemorrhage. There is no significant mass effect. 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 Small chronic right cerebellar infarcts. Additional patchy T2 hyperintensity in the supratentorial white matter is nonspecific but probably reflects mild chronic microvascular ischemic changes. There is no intracranial mass or hydrocephalus. Vascular: Major vessel flow voids at the skull base are preserved. Skull and upper cervical spine: Normal marrow signal is preserved.  Sinuses/Orbits: Mild mucosal thickening. Orbits are unremarkable. Other: Sella is unremarkable. Mastoid air cells are clear. MRA HEAD Motion artifact is present. Intracranial internal carotid arteries are patent with atherosclerotic irregularity. Left M1 MCA is patent. There appears to be now more proximal left M2 MCA branch occlusion within limitation of motion. Proximal right middle and both prox anterior cerebral arteries are patent. Intracranial vertebral arteries, basilar artery, proximal posterior cerebral arteries are patent. 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 There is also some watershed involvement with the ACA and PCA as well some left ACA involvement. No significant mass effect. Chronic findings detailed above. Motion degraded vascular imaging. Suspected more now proximal left M2 MCA branch occlusion. Electronically Signed   By:  Macy Mis M.D.   On: 08/11/2019 17:22   MR BRAIN WO CONTRAST  Result Date: 08/11/2019 CLINICAL DATA:  Stroke post thrombectomy, follow-up EXAM: MRI HEAD WITHOUT CONTRAST MRA HEAD WITHOUT CONTRAST TECHNIQUE: Multiplanar, multiecho pulse sequences of the brain and surrounding structures were obtained without intravenous contrast. Angiographic images of the head were obtained using MRA technique without contrast. COMPARISON:  Correlation made with prior CT imaging FINDINGS: MRI HEAD Brain: Large evolving acute left MCA territory infarction involving frontoparietal greater than temporal lobes as well as the anterior insula. There is also some left MCA/ACA and MCA/PCA watershed involvement as well as left ACA involvement. Areas of susceptibility likely reflecting petechial reperfusion hemorrhage. There is no significant mass effect. 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 Small chronic right cerebellar infarcts. Additional patchy T2 hyperintensity in the supratentorial white matter is nonspecific but probably reflects mild chronic microvascular ischemic changes. There is no intracranial mass or hydrocephalus. Vascular: Major vessel flow voids at the skull base are preserved. Skull and upper cervical spine: Normal marrow signal is preserved. Sinuses/Orbits: Mild mucosal thickening. Orbits are unremarkable. Other: Sella is unremarkable. Mastoid air cells are clear. MRA HEAD Motion artifact is present. Intracranial internal carotid arteries are patent with atherosclerotic irregularity. Left M1 MCA is patent. There appears to be now more proximal left M2 MCA branch occlusion within limitation of motion. Proximal right middle and both prox anterior cerebral arteries are patent. Intracranial vertebral arteries, basilar artery, proximal posterior cerebral arteries are patent. IMPRESSION: Large evolving acute left MCA  territory infarction with evidence of hemorrhagic conversion without discrete hematoma. There is also some watershed involvement with the ACA and PCA as well some left ACA involvement. No significant mass effect. Chronic findings detailed above. Motion degraded vascular imaging. Suspected more now proximal left M2 MCA branch occlusion. Electronically Signed   By: Macy Mis M.D.   On: 08/11/2019 17:22   IR CT Head Ltd  Result Date: 08/11/2019 INDICATION: [age]-year-old male with past medical history significant for hypertension, diabetes mellitus, hyperlipidemia and heavy alcohol use with a modified Rankin scale of 0. He had a sudden onset of right-sided weakness at 16:30 on 08/10/2019. EMS was called and at arrival, it was noted BP 230s with aphasia, left gaze and right hemiplegia. En route, EKG showed probable Afib. Admission, NIHSS was 25. Head CT showed subtle decreased density in the cortex in the left frontal and parietal regions and no hemorrhage. IV tPA was administered at 17:49. CT angiogram showed a left M3/MCA superior division branch occlusion. Initially, it was planned for the patient to be transferred the to an outside hospital for intervention given that  the angio suite was occupied by another emergency intervention. However, the first intervention was completed prior to patient's transfer. Therefore, we accepted the patient to our service for an emergency diagnostic cerebral angiogram and mechanical thrombectomy. EXAM: Diagnostic cerebral angiogram Mechanical thrombectomy Flat panel head CT COMPARISON:  CT/CT angiogram of the head and neck August 10, 2019. MEDICATIONS: No antibiotics given. ANESTHESIA/SEDATION: The procedure was performed in the general anesthesia provided by the Department of Anesthesiology. FLUOROSCOPY TIME:  Fluoroscopy Time: 40 minutes (1090 mGy). COMPLICATIONS: None immediate. TECHNIQUE: Informed written consent was obtained from the patient's son after a thorough  discussion of the procedural risks, benefits and alternatives. All questions were addressed. Maximal Sterile Barrier Technique was utilized including caps, mask, sterile gowns, sterile gloves, sterile drape, hand hygiene and skin antiseptic. A timeout was performed prior to the initiation of the procedure. The right groin was prepped and draped in the usual sterile fashion. Using a micropuncture kit and the modified Seldinger technique, access was gained to the right common femoral artery and an 8 French sheath was placed. Under fluoroscopy a Zoom 88 catheter was advanced over a 6 [place] Berenstein catheter and a Terumo 0.035 inch glidewire into the aortic arch. The catheter was then placed into the left common carotid artery. Frontal and lateral angiograms of the neck were obtained. The catheter was then advanced into the mid cervical segment of the left ICA. Frontal and lateral angiograms of the head were obtained. FINDINGS: 1. Minimal irregularity of the left carotid bulb, suggesting early atherosclerotic disease without stenosis. 2. Increased tortuosity of the cervical segment of the left ICA, may be related to longstanding hypertension. 3. There is no occlusion of the proximal M3 segment of the left MCA superior division branch. 4. Slow flow is noted in some A5/ACA and multiple M4/MCA posterior division branches. PROCEDURE: Under biplane roadmap, a Zoom 35 aspiration catheter was navigated over a synchro support microguidewire into the left M3/MCA superior division branch. The wire was removed and the catheter was connected to an aspiration pump. Continuous aspiration performed for 4 minutes. The catheter was then removed under continuous aspiration. A piece of clot was noted in the aspiration pump. Follow-up angiogram showed persistent occlusion of the left M3/MCA superior division branch. In a similar fashion, a second aspiration pass was performed. Follow-up angiogram showed persistent occlusion of the left  M3/MCA superior division branch. Under biplane roadmap, a phenom 21 microcatheter was navigated over an Aristotle 14 microwire into the left M3/MCA superior division branch. Then, a 3 mm solitaire stent retriever was deployed spanning the M3 segment. The device was allowed to intercalated with the clot for 4 minutes. The microcatheter was connected to a penumbra aspiration pump. The thrombectomy device was removed under constant aspiration. A piece of clot was noted in the stent retriever. Follow-up angiogram showed small recanalize length of the M3 segment with persistent occlusion of the distal left M3/MCA superior division branch. Flat panel CT of the head was obtained and post processed in a separate workstation with concurrent attending physician supervision. Selected images were sent to PACS. Hyperdensity noted within the anterior left sylvian fissure likely contrast retained in the occluded vessel with minimal surrounding subarachnoid hemorrhage. Delayed left ICA angiograms with frontal and lateral views of the head were obtained showing persistent occlusion of left M3/MCA anterior division branch as well as persistent A5/ACA and M4/MCA slow flow. The catheter was subsequently withdrawn. A right common femoral artery angiogram was obtained with frontal and lateral views. The puncture  is at the common femoral artery. The sheath was exchanged over the wire for an 8 [place] Angio-Seal which was utilized for access closure. Immediate hemostasis was achieved. IMPRESSION: 1. Mechanical thrombectomy performed for treatment of a left M3/MCA superior division branch with to direct aspiration passes and 1 stent retriever pass. Only minimal recanalization was achieved (TICI 2A) despite retrieval of clot in 2 passes. 2. Slow flow in A5/ACA and M4/MCA branches, too distal for safe endovascular access. 3. Postprocedural flat panel CT showing negligible amount of subarachnoid hemorrhage in the left sylvian fissure PLAN:  Patient remained intubated and transferred to ICU for further care. Electronically Signed   By: Baldemar Lenis M.D.   On: 08/11/2019 15:25   DG Chest Port 1 View  Result Date: 08/17/2019 CLINICAL DATA:  History of stroke.  Lethargy.  Possible aspiration. EXAM: PORTABLE CHEST 1 VIEW COMPARISON:  Single-view of the chest 08/14/2019 and 08/13/2019. FINDINGS: Feeding tube courses into the stomach and below the inferior margin of the film. Patchy bibasilar airspace opacities are seen. Lung volumes are lower than on the comparison exams. No pneumothorax or pleural effusion. Cardiomegaly is noted. Atherosclerosis is seen. IMPRESSION: Patchy bibasilar airspace opacities may be due to atelectasis in this low volume chest. Atelectasis, aspiration or pneumonia are also within the differential. Cardiomegaly. Aortic Atherosclerosis (ICD10-I70.0). Electronically Signed   By: Drusilla Kanner M.D.   On: 08/17/2019 15:02   DG CHEST PORT 1 VIEW  Result Date: 08/14/2019 CLINICAL DATA:  Fever EXAM: PORTABLE CHEST 1 VIEW COMPARISON:  Chest radiograph 08/13/2019. FINDINGS: Monitoring leads overlie the patient. Enteric tube courses inferior to the diaphragm. Stable enlarged cardiac and mediastinal contours. Bibasilar heterogeneous opacities. Multiple bilateral rib fractures. IMPRESSION: Cardiomegaly. Bibasilar heterogeneous opacities may represent atelectasis. Infection not excluded. Electronically Signed   By: Annia Belt M.D.   On: 08/14/2019 14:09   DG CHEST PORT 1 VIEW  Result Date: 08/13/2019 CLINICAL DATA:  Leukocytosis.  History of CHF, diabetes. EXAM: PORTABLE CHEST 1 VIEW COMPARISON:  Chest x-ray 06/02/2017.  CT 05/10/2017. FINDINGS: Feeding tube noted with tip below left hemidiaphragm. Stable right paratracheal soft tissue prominence consistent with known thyroid enlargement. Mediastinum and hilar structures are stable. Cardiomegaly with mild pulmonary venous congestion and bibasilar interstitial  prominence. CHF cannot be excluded. Low lung volumes with bibasilar atelectasis. Mild left base infiltrate cannot be excluded. Interval resolution of previously identified large left pleural effusion. No pneumothorax. Old rib fractures again noted. Surgical clips left upper quadrant. IMPRESSION: 1.  Feeding tube noted with tip below left hemidiaphragm. 2. Cardiomegaly with mild pulmonary venous congestion and bibasilar interstitial prominence. Mild CHF cannot be excluded. Pneumonitis cannot be excluded. 3. Mild left base infiltrate cannot be excluded. Previously identified large left base pleural effusion has resolved. Electronically Signed   By: Maisie Fus  Register   On: 08/13/2019 14:18   DG Swallowing Func-Speech Pathology  Result Date: 08/17/2019 Objective Swallowing Evaluation: Type of Study: MBS-Modified Barium Swallow Study  Patient Details Name: Malik [name] MRN: [id number] Date of Birth: 10/12/1945 Today's Date: 08/17/2019 Time: SLP Start Time (ACUTE ONLY): 0827 -SLP Stop Time (ACUTE ONLY): 0853 SLP Time Calculation (min) (ACUTE ONLY): 26 min Past Medical History: Past Medical History: Diagnosis Date . Angina of effort (HCC)  . Chronic diastolic CHF (congestive heart failure) (HCC)  . Diabetes (HCC)  . Diabetes mellitus without complication (HCC)  . Dilated cardiomyopathy (HCC)  . Heart disease  . High blood pressure  . Hypertension  . LVH (left ventricular hypertrophy)  due to hypertensive disease  . Moderate mitral insufficiency  . Neuropathy   with LE weakness . Sleep apnea  . TBI (traumatic brain injury) [facility])  Past Surgical History: Past Surgical History: Procedure Laterality Date . BACK SURGERY   . IR CT HEAD LTD  08/10/2019 . IR PERCUTANEOUS ART THROMBECTOMY/INFUSION INTRACRANIAL INC DIAG ANGIO  08/10/2019 . RADIOLOGY WITH ANESTHESIA N/A 08/10/2019  Procedure: IR WITH ANESTHESIA -CODE STROKE;  Surgeon: Radiologist, Medication, MD;  Location: [place];  Service: Radiology;  Laterality: N/A; . 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 CT Head 6/2 showed significant progression of L MCA infarct with hypodensity in the L middle frontal gyrus extending into the L parietal lobe and L occipital lobe. BSE in 2019 recommended NPO but he progressed very quickly to regular solids/thin liquids as mentation improved. PMH includes: TBI, sleep apnea, HTN, DM, CHF, HLD, heavy alcohol use  Subjective: pt alert, aphasic Assessment / Plan / Recommendation CHL IP CLINICAL IMPRESSIONS 08/17/2019 Clinical Impression Pt presents with a moderate oral and more mild pharyngeal dysphagia. Of note, he had facial grimacing and was resistent to intake during study, suspect related to the taste of barium although difficult to determine with certainty given communication deficits. He had more oral holding than was observed at bedside. Significant amounts of R-sided anterior spillage was noted with liquids via spoon and cup but he could not suck on the straw to get any liquid today. There is also posterior spillage into the pharynx, reaching the valleculae primarily but at times the pyriform sinuses. Anterior and posterior spillage is reduced with nectar thick liquids. Thin liquids by cup are aspirated before the swallow with a spontaneous cough reflex triggered. Most of the thin liquids from a spoon were lost anteriorly. Pt had improved airway protection before and during the swallow with nectar thick liquids, with penetration x1 after the swallow with oral residue from a cup sip, although question if he could have also aspirated some secretions. SLP plans to trial nectar thick liquids and purees at bedside to see if he can be further challenged when he does not seem to have such a taste aversion. If  there are no clinical s/s of aspiration as were elicited during aspiration on MBS, could start a diet of these consistencies.  SLP Visit Diagnosis Dysphagia, oropharyngeal phase (R13.12) Attention and concentration deficit following -- Frontal lobe and executive function deficit following -- Impact on safety and function Moderate aspiration risk   CHL IP TREATMENT RECOMMENDATION 08/17/2019 Treatment Recommendations Therapy as outlined in treatment plan below   Prognosis 08/17/2019 Prognosis for Safe Diet Advancement Good Barriers to Reach Goals Language deficits Barriers/Prognosis Comment -- CHL IP DIET RECOMMENDATION 08/17/2019 SLP Diet Recommendations NPO Liquid Administration via -- Medication Administration Via alternative means Compensations -- Postural Changes --   CHL IP OTHER RECOMMENDATIONS 08/17/2019 Recommended Consults -- Oral Care Recommendations Oral care QID Other Recommendations Have oral suction available   CHL IP FOLLOW UP RECOMMENDATIONS 08/17/2019 Follow up Recommendations Skilled Nursing facility   [facility] IP FREQUENCY AND DURATION 08/17/2019 Speech Therapy Frequency (ACUTE ONLY) min 2x/week Treatment Duration 2 weeks      CHL IP ORAL PHASE 08/17/2019 Oral Phase Impaired Oral - Pudding Teaspoon -- Oral - Pudding Cup --  Oral - Honey Teaspoon -- Oral - Honey Cup -- Oral - Nectar Teaspoon Right anterior bolus loss;Holding of bolus;Right pocketing in lateral sulci;Lingual/palatal residue;Delayed oral transit;Premature spillage Oral - Nectar Cup Right anterior bolus loss;Holding of bolus;Right pocketing in lateral sulci;Lingual/palatal residue;Delayed oral transit;Premature spillage Oral - Nectar Straw -- Oral - Thin Teaspoon Right anterior bolus loss;Holding of bolus;Right pocketing in lateral sulci;Lingual/palatal residue;Delayed oral transit;Premature spillage Oral - Thin Cup Right anterior bolus loss;Holding of bolus;Right pocketing in lateral sulci;Lingual/palatal residue;Delayed oral transit;Premature spillage  Oral - Thin Straw -- Oral - Puree Right anterior bolus loss;Holding of bolus;Right pocketing in lateral sulci;Lingual/palatal residue;Delayed oral transit Oral - Mech Soft -- Oral - Regular -- Oral - Multi-Consistency -- Oral - Pill -- Oral Phase - Comment --  CHL IP PHARYNGEAL PHASE 08/17/2019 Pharyngeal Phase Impaired Pharyngeal- Pudding Teaspoon -- Pharyngeal -- Pharyngeal- Pudding Cup -- Pharyngeal -- Pharyngeal- Honey Teaspoon -- Pharyngeal -- Pharyngeal- Honey Cup -- Pharyngeal -- Pharyngeal- Nectar Teaspoon WFL Pharyngeal -- Pharyngeal- Nectar Cup Penetration/Apiration after swallow Pharyngeal Material enters airway, remains ABOVE vocal cords then ejected out Pharyngeal- Nectar Straw -- Pharyngeal -- Pharyngeal- Thin Teaspoon WFL Pharyngeal -- Pharyngeal- Thin Cup Penetration/Aspiration before swallow Pharyngeal Material enters airway, passes BELOW cords and not ejected out despite cough attempt by patient Pharyngeal- Thin Straw -- Pharyngeal -- Pharyngeal- Puree WFL Pharyngeal -- Pharyngeal- Mechanical Soft -- Pharyngeal -- Pharyngeal- Regular -- Pharyngeal -- Pharyngeal- Multi-consistency -- Pharyngeal -- Pharyngeal- Pill -- Pharyngeal -- Pharyngeal Comment --  CHL IP CERVICAL ESOPHAGEAL PHASE 08/17/2019 Cervical Esophageal Phase WFL Pudding Teaspoon -- Pudding Cup -- Honey Teaspoon -- Honey Cup -- Nectar Teaspoon -- Nectar Cup -- Nectar Straw -- Thin Teaspoon -- Thin Cup -- Thin Straw -- Puree -- Mechanical Soft -- Regular -- Multi-consistency -- Pill -- Cervical Esophageal Comment -- Osie Bond., M.A. [place] Pager [phone] Office [phone] 08/17/2019, 9:50 AM              ECHOCARDIOGRAM COMPLETE  Result Date: 08/11/2019    ECHOCARDIOGRAM REPORT   Patient Name:   LENA FIELDHOUSE Date of Exam: 08/11/2019 Medical Rec #:  [id number]         Height:       71.0 in Accession #:    [id number]        Weight:       220.7 lb Date of Birth:  1945/08/10         BSA:          2.199 m  Patient Age:    89 years          BP:           166/88 mmHg Patient Gender: M                 HR:           91 bpm. Exam Location:  Inpatient Procedure: 2D Echo, Color Doppler and Cardiac Doppler Indications:    Stroke i163.9  History:        Patient has no prior history of Echocardiogram examinations.                 CHF; Risk Factors:Hypertension, Dyslipidemia and Sleep Apnea.  Sonographer:    Raquel Sarna Senior RDCS Referring Phys: [id number] [place]  1. Left ventricular ejection fraction, by estimation, is 50 to 55%. The left ventricle has low normal function. The left ventricle has no regional wall motion abnormalities. There is severe left ventricular hypertrophy. Left  ventricular diastolic parameters are consistent with Grade II diastolic dysfunction (pseudonormalization).  2. Right ventricular systolic function is mildly reduced. The right ventricular size is mildly enlarged. There is severely elevated pulmonary artery systolic pressure. The estimated right ventricular systolic pressure is 62.1 mmHg.  3. Left atrial size was mildly dilated.  4. Right atrial size was mildly dilated.  5. The mitral valve is normal in structure. Mild mitral valve regurgitation. No evidence of mitral stenosis.  6. The aortic valve is tricuspid. Aortic valve regurgitation is not visualized. Mild aortic valve sclerosis is present, with no evidence of aortic valve stenosis.  7. The inferior vena cava is dilated in size with <50% respiratory variability, suggesting right atrial pressure of 15 mmHg. FINDINGS  Left Ventricle: Left ventricular ejection fraction, by estimation, is 50 to 55%. The left ventricle has low normal function. The left ventricle has no regional wall motion abnormalities. The left ventricular internal cavity size was normal in size. There is severe left ventricular hypertrophy. Left ventricular diastolic parameters are consistent with Grade II diastolic dysfunction (pseudonormalization). Right Ventricle:  The right ventricular size is mildly enlarged. No increase in right ventricular wall thickness. Right ventricular systolic function is mildly reduced. There is severely elevated pulmonary artery systolic pressure. The tricuspid regurgitant velocity is 3.43 m/s, and with an assumed right atrial pressure of 15 mmHg, the estimated right ventricular systolic pressure is 62.1 mmHg. Left Atrium: Left atrial size was mildly dilated. Right Atrium: Right atrial size was mildly dilated. Pericardium: Trivial pericardial effusion is present. Mitral Valve: The mitral valve is normal in structure. Mild mitral valve regurgitation. No evidence of mitral valve stenosis. Tricuspid Valve: The tricuspid valve is normal in structure. Tricuspid valve regurgitation is trivial. Aortic Valve: The aortic valve is tricuspid. Aortic valve regurgitation is not visualized. Mild aortic valve sclerosis is present, with no evidence of aortic valve stenosis. Pulmonic Valve: The pulmonic valve was normal in structure. Pulmonic valve regurgitation is trivial. Aorta: The aortic root is normal in size and structure. Venous: The inferior vena cava is dilated in size with less than 50% respiratory variability, suggesting right atrial pressure of 15 mmHg. IAS/Shunts: No atrial level shunt detected by color flow Doppler.  LEFT VENTRICLE PLAX 2D LVIDd:         5.04 cm  Diastology LVIDs:         4.06 cm  LV e' lateral:   5.66 cm/s LV PW:         1.61 cm  LV E/e' lateral: 17.1 LV IVS:        1.42 cm  LV e' medial:    5.98 cm/s LVOT diam:     2.00 cm  LV E/e' medial:  16.2 LV SV:         59 LV SV Index:   27 LVOT Area:     3.14 cm  RIGHT VENTRICLE RV S prime:     10.00 cm/s TAPSE (M-mode): 2.2 cm LEFT ATRIUM             Index       RIGHT ATRIUM           Index LA diam:        5.20 cm 2.37 cm/m  RA Area:     25.90 cm LA Vol (A2C):   82.7 ml 37.61 ml/m RA Volume:   84.00 ml  38.21 ml/m LA Vol (A4C):   57.1 ml 25.97 ml/m LA Biplane Vol: 76.2 ml 34.66 ml/m   AORTIC  VALVE LVOT Vmax:   86.50 cm/s LVOT Vmean:  56.100 cm/s LVOT VTI:    0.188 m  AORTA Ao Root diam: 3.30 cm Ao Asc diam:  3.60 cm MITRAL VALVE               TRICUSPID VALVE MV Area (PHT): 4.21 cm    TR Peak grad:   47.1 mmHg MV Decel Time: 180 msec    TR Vmax:        343.00 cm/s MV E velocity: 97.00 cm/s MV A velocity: 89.20 cm/s  SHUNTS MV E/A ratio:  1.09        Systemic VTI:  0.19 m                            Systemic Diam: 2.00 cm Loralie Champagne MD Electronically signed by Loralie Champagne MD Signature Date/Time: 08/11/2019/5:29:10 PM    Final    IR PERCUTANEOUS ART THROMBECTOMY/INFUSION INTRACRANIAL INC DIAG ANGIO  Result Date: 08/11/2019 INDICATION: [age]-year-old male with past medical history significant for hypertension, diabetes mellitus, hyperlipidemia and heavy alcohol use with a modified Rankin scale of 0. He had a sudden onset of right-sided weakness at 16:30 on 08/10/2019. EMS was called and at arrival, it was noted BP 230s with aphasia, left gaze and right hemiplegia. En route, EKG showed probable Afib. Admission, NIHSS was 25. Head CT showed subtle decreased density in the cortex in the left frontal and parietal regions and no hemorrhage. IV tPA was administered at 17:49. CT angiogram showed a left M3/MCA superior division branch occlusion. Initially, it was planned for the patient to be transferred the to an outside hospital for intervention given that the angio suite was occupied by another emergency intervention. However, the first intervention was completed prior to patient's transfer. Therefore, we accepted the patient to our service for an emergency diagnostic cerebral angiogram and mechanical thrombectomy. EXAM: Diagnostic cerebral angiogram Mechanical thrombectomy Flat panel head CT COMPARISON:  CT/CT angiogram of the head and neck August 10, 2019. MEDICATIONS: No antibiotics given. ANESTHESIA/SEDATION: The procedure was performed in the general anesthesia provided by the Department of  Anesthesiology. FLUOROSCOPY TIME:  Fluoroscopy Time: 40 minutes (1090 mGy). COMPLICATIONS: None immediate. TECHNIQUE: Informed written consent was obtained from the patient's son after a thorough discussion of the procedural risks, benefits and alternatives. All questions were addressed. Maximal Sterile Barrier Technique was utilized including caps, mask, sterile gowns, sterile gloves, sterile drape, hand hygiene and skin antiseptic. A timeout was performed prior to the initiation of the procedure. The right groin was prepped and draped in the usual sterile fashion. Using a micropuncture kit and the modified Seldinger technique, access was gained to the right common femoral artery and an 8 French sheath was placed. Under fluoroscopy a Zoom 88 catheter was advanced over a 6 [place] Berenstein catheter and a Terumo 0.035 inch glidewire into the aortic arch. The catheter was then placed into the left common carotid artery. Frontal and lateral angiograms of the neck were obtained. The catheter was then advanced into the mid cervical segment of the left ICA. Frontal and lateral angiograms of the head were obtained. FINDINGS: 1. Minimal irregularity of the left carotid bulb, suggesting early atherosclerotic disease without stenosis. 2. Increased tortuosity of the cervical segment of the left ICA, may be related to longstanding hypertension. 3. There is no occlusion of the proximal M3 segment of the left MCA superior division branch. 4. Slow flow is noted in  some A5/ACA and multiple M4/MCA posterior division branches. PROCEDURE: Under biplane roadmap, a Zoom 35 aspiration catheter was navigated over a synchro support microguidewire into the left M3/MCA superior division branch. The wire was removed and the catheter was connected to an aspiration pump. Continuous aspiration performed for 4 minutes. The catheter was then removed under continuous aspiration. A piece of clot was noted in the aspiration pump. Follow-up angiogram  showed persistent occlusion of the left M3/MCA superior division branch. In a similar fashion, a second aspiration pass was performed. Follow-up angiogram showed persistent occlusion of the left M3/MCA superior division branch. Under biplane roadmap, a phenom 21 microcatheter was navigated over an Aristotle 14 microwire into the left M3/MCA superior division branch. Then, a 3 mm solitaire stent retriever was deployed spanning the M3 segment. The device was allowed to intercalated with the clot for 4 minutes. The microcatheter was connected to a penumbra aspiration pump. The thrombectomy device was removed under constant aspiration. A piece of clot was noted in the stent retriever. Follow-up angiogram showed small recanalize length of the M3 segment with persistent occlusion of the distal left M3/MCA superior division branch. Flat panel CT of the head was obtained and post processed in a separate workstation with concurrent attending physician supervision. Selected images were sent to PACS. Hyperdensity noted within the anterior left sylvian fissure likely contrast retained in the occluded vessel with minimal surrounding subarachnoid hemorrhage. Delayed left ICA angiograms with frontal and lateral views of the head were obtained showing persistent occlusion of left M3/MCA anterior division branch as well as persistent A5/ACA and M4/MCA slow flow. The catheter was subsequently withdrawn. A right common femoral artery angiogram was obtained with frontal and lateral views. The puncture is at the common femoral artery. The sheath was exchanged over the wire for an 8 [place] Angio-Seal which was utilized for access closure. Immediate hemostasis was achieved. IMPRESSION: 1. Mechanical thrombectomy performed for treatment of a left M3/MCA superior division branch with to direct aspiration passes and 1 stent retriever pass. Only minimal recanalization was achieved (TICI 2A) despite retrieval of clot in 2 passes. 2. Slow flow  in A5/ACA and M4/MCA branches, too distal for safe endovascular access. 3. Postprocedural flat panel CT showing negligible amount of subarachnoid hemorrhage in the left sylvian fissure PLAN: Patient remained intubated and transferred to ICU for further care. Electronically Signed   By: Pedro Earls M.D.   On: 08/11/2019 15:25   CT HEAD CODE STROKE WO CONTRAST  Result Date: 08/10/2019 CLINICAL DATA:  Code stroke. Ataxia, stroke suspected. Additional history provided: Sudden collapse, left gaze, right hemiplegia. EXAM: CT HEAD WITHOUT CONTRAST TECHNIQUE: Contiguous axial images were obtained from the base of the skull through the vertex without intravenous contrast. COMPARISON:  Noncontrast head CT 05/10/2017 FINDINGS: Brain: Mild ill-defined hypoattenuation within the cerebral white matter is nonspecific, but consistent with chronic small vessel ischemic disease. Mild generalized parenchymal atrophy. There is no acute intracranial hemorrhage. No demarcated cortical infarct. No extra-axial fluid collection. No evidence of intracranial mass. No midline shift. Vascular: No hyperdense vessel.  Atherosclerotic calcifications. Skull: Normal. Negative for fracture or focal lesion. Sinuses/Orbits: Visualized orbits show no acute finding. Mild paranasal sinus mucosal thickening. No significant mastoid effusion. ASPECTS [facility] Stroke Program Early CT Score) - Ganglionic level infarction (caudate, lentiform nuclei, internal capsule, insula, M1-M3 cortex): 7 - Supraganglionic infarction (M4-M6 cortex): 3 Total score (0-10 with 10 being normal): 10 These results were communicated to Dr. Erlinda Hong At 5:57 pmon 6/1/2021by text  page via the [facility] messaging system. IMPRESSION: No CT evidence of acute intracranial abnormality. Mild generalized parenchymal atrophy and chronic small vessel ischemic disease. Mild paranasal sinus mucosal thickening. Electronically Signed   By: Kellie Simmering DO   On: 08/10/2019 17:57   VAS  [place] LOWER EXTREMITY VENOUS (DVT)  Result Date: 08/12/2019  Lower Venous DVTStudy Indications: Stroke.  Risk Factors: None identified. Limitations: Poor ultrasound/tissue interface and patient immobility, patient positioning. Comparison Study: No prior studies. Performing Technologist: Oliver Hum RVT  Examination Guidelines: A complete evaluation includes B-mode imaging, spectral Doppler, color Doppler, and power Doppler as needed of all accessible portions of each vessel. Bilateral testing is considered an integral part of a complete examination. Limited examinations for reoccurring indications may be performed as noted. The reflux portion of the exam is performed with the patient in reverse Trendelenburg.  +---------+---------------+---------+-----------+----------+--------------+ RIGHT    CompressibilityPhasicitySpontaneityPropertiesThrombus Aging +---------+---------------+---------+-----------+----------+--------------+ CFV      Full           Yes      Yes                                 +---------+---------------+---------+-----------+----------+--------------+ SFJ      Full                                                        +---------+---------------+---------+-----------+----------+--------------+ FV Prox  Full                                                        +---------+---------------+---------+-----------+----------+--------------+ FV Mid   Full                                                        +---------+---------------+---------+-----------+----------+--------------+ FV DistalFull                                                        +---------+---------------+---------+-----------+----------+--------------+ PFV      Full                                                        +---------+---------------+---------+-----------+----------+--------------+ POP      Full           Yes      Yes                                  +---------+---------------+---------+-----------+----------+--------------+ PTV      Full                                                        +---------+---------------+---------+-----------+----------+--------------+  PERO     Full                                                        +---------+---------------+---------+-----------+----------+--------------+   +---------+---------------+---------+-----------+----------+--------------+ LEFT     CompressibilityPhasicitySpontaneityPropertiesThrombus Aging +---------+---------------+---------+-----------+----------+--------------+ CFV      Full           Yes      Yes                                 +---------+---------------+---------+-----------+----------+--------------+ SFJ      Full                                                        +---------+---------------+---------+-----------+----------+--------------+ FV Prox  Full                                                        +---------+---------------+---------+-----------+----------+--------------+ FV Mid   Full                                                        +---------+---------------+---------+-----------+----------+--------------+ FV DistalFull                                                        +---------+---------------+---------+-----------+----------+--------------+ PFV      Full                                                        +---------+---------------+---------+-----------+----------+--------------+ POP      Full           Yes      Yes                                 +---------+---------------+---------+-----------+----------+--------------+ PTV      Full                                                        +---------+---------------+---------+-----------+----------+--------------+ PERO     Full                                                         +---------+---------------+---------+-----------+----------+--------------+  Summary: RIGHT: - There is no evidence of deep vein thrombosis in the lower extremity.  - No cystic structure found in the popliteal fossa.  LEFT: - There is no evidence of deep vein thrombosis in the lower extremity.  - No cystic structure found in the popliteal fossa.  *See table(s) above for measurements and observations. Electronically signed by Monica Martinez MD on 08/12/2019 at 39:16:32 PM.    Final       HISTORY OF PRESENT ILLNESS Jaivian Battaglini is a 74 y.o. African American male with PMH of HTN, DM, HLD, heavy alcohol use who was helping a neighour with handy work. At 4:30pm on 08/10/2019 (LKW), neighbor heard falling sound in the kitchen, they saw him on the ground, not able to communicate, right side weakness. EMS called, on arrival pt BP 230s with aphasia, left gaze and right hemiplegia. Not enough PMH or medication info to obtain. En route, EKG showed probable Afib. In ER, NIHSS = 25. CT no acute abnormalities. BP controlled with 2 rounds of labetalol. Discussed with son over the phone of benefit and risk of tPA, son agreed and tPA given. CTA head and neck showed left mid M2 occlusion. As per son and daughter, pt not compliant with medication and heavy drinker. No smoking cigarettes. Sent to IR for mechanical thrombectomy.   HOSPITAL COURSE Mr. Milt Coye is a 74 y.o. male with history of HTN, DM, HLD, heavy alcohol who had sudden onset aphasia, L gaze preference and R hemiplegia. EKG showed AF en route. High BP treated with labetalol. Received tPA 08/10/2019 at 1749. Taken to IR for a L M2 occlusion.  Stroke:   L MCA infarct s/p tPA and attempted IR of L B1/D1 occlusion, embolic secondary to unknown source   Code Stroke CT head No acute abnormality. ASPECTS 10.     CTA head proximal to mid L M2 occlusion. Mild B ICA atherosclerosis.  CTA neck B ICA bifurcation and bulbs atherosclerosis. L VA origin  atherosclerosis. 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 [place] recommended  Cerebral angio L M3 superior branch occlusion w/ subocclusive filling defect.    Post IR CT Minimal contrast extravasation L M3 branch  CT 6/2 progression of L MCA infarct, hypodensity L middle frontal gyrus extending into L parietal and L occipital lobe L sylvian fissure SAH.  MRI  Large evolving L MCA infarct w/ hemorrhagic transformation w/o discrete hematoma. Also L ACA infarct and ACA/PCA watershed territory. No significant mass effect.   MRA  Motion. Proximal L M2 branch occlusion   2D Echo EF 50-55%. No source of embolus. B atrial mildly dilated.  LE venous doppler no DVT  May consider loop recorder if further improves at next neuro clinic visit.   LDL 127  HgbA1c 5.4  No antithrombotic prior to admission, on ASA 325 and plavix DAPT for 3 months and then ASA alone.    Therapy recommendations:  SNF   Disposition:  SNF  Hypertensive Emergency  SBP 211/133 on arrival  Home meds:  norvasc 10, lasix 20, hydralazine 100 q8, metoprolol 25 bid (pt not taking)  Labetalol prior to tPA  Off cleviprex gtt    Stable on the high end  SBP goal < 180  On metoprolol 25 bid and amlodipine 10  Long-term BP goal 130-150 given left M2 occlusion   Hyperlipidemia  Home meds:  lipitor 40  LDL 127, goal < 70  On lipitor 80   Continue statin at discharge   Diabetes  type II, hyperglycemia   Home meds:  Glipizide 10, metformin 500 bid  HgbA1c 5.4, goal < 7.0  CBGs & SSI  Dysphagia Severe 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  UCx neg  CXR - 6/5  Cardiomegaly. Bibasilar atelectasis. Infection not excluded.  On Rocephin 6/3>>6/8  Iron deficiency anemia  Hb - 8.9->8.8->7.7->8.3->7.9  Low MCV and MCH  Iron penal Fe 13 (L)m TIBC 295, Sat 4 (L), UIBC 282, Fer 25  On iron pill tid  AKI on CKD IIIa  Cre 1.34.....0.87->0.94->0.98->1.02  Encourage po intake  Periodic BMP monitoring  ETOH abuse  Alcohol level 46  As per family, he is heavy drinker  CIWA protocol - discontinued  B1 and FA and MVI in hospital  Other Stroke Risk Factors  Advanced age  Former cigarette smoker  Obesity, Body mass index is 30.53 kg/m., recommend weight loss, diet and exercise as appropriate   Coronary artery disease - angina   Obstructive sleep apnea  Chronic diastolic Congestive heart failure  Dilated cardiomyopathy, LVH  Other Active Problems  Hx TBI  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 A dominant right thyroid nodule measures 3.5 cm. Nonemergent thyroid ultrasound is recommended for further evaluation. (by CTA 6/1)  Hyperkalemia - resolved  Thrombocytosis - platelet [phone]->714->647  Penis anterior stage 2 pressure injury not present on admission. Silvadene cream to wound daily until healed   DISCHARGE EXAM Blood pressure (!) 149/76, pulse 85, temperature 98.1 F (36.7 C), temperature source Oral, resp. rate 18, height '5\' 10"'$  (1.778 m), weight 96.5 kg, SpO2 98 %.  General- well nourished, well developed elderly African-American male, in no apparent distress.   Ophthalmologic - fundi not visualized due to noncooperation.   Cardiovascular -regular rhythm and rate    Neuro - awake with eyes open, able to track on the left but not on the right, not following simple commands, global aphasia. Blinking to visual threat on the left but not on the right, PERRL, right gaze incomplete. Right facial droop, tongue protrusion not cooperative. Right arm and leg flaccid. Left UE and  LE spontaneous movement against gravity. DTR diminished on the right, no babinski. Sensation, coordination and gait not tested.  Discharge Diet   Dysphagia 1 nectar thick liquids  DISCHARGE PLAN  Disposition:  Skilled nursing facility for ongoing PT, OT and ST.   Encourage PO intake food and fluids  Long-term BP goal 130-150 given left M2 occlusion   Outpatient thyroid US recommended   consider loop recorder if ongoing neuro improvement at next neuro clinic visit to look for AF as source of stroke  aspirin 325 mg daily and clopidogrel 75 mg daily for secondary stroke prevention for 3 months then aspirin alone.  Ongoing stroke risk factor control by Primary Care Physician at time of discharge  Follow-up PCP Aycock, Ngwe A, MD in 2 weeks.  Follow-up in [facility] in 4 weeks, office to schedule an appointment.   40 minutes were spent preparing discharge.  Rosalin Hawking, MD PhD Stroke Neurology 08/24/2019 10:34 PM

## 2019-08-24 NOTE — Progress Notes (Signed)
Occupational Therapy Treatment Patient Details Name: Malik [name] MRN: [id number] DOB: 1945-07-28 Today's Date: 08/24/2019    History of present illness This 74 y.o. male admitted with sudden onset of aphasia, Lt gaze preference, and Rt hemiplegia.   He was found to have Lt MCA infarct with proximal to mid M2/M3 occlusion.  He is s/p tPA and thrombectomy.  Repeast CT showed significant progression of Lt MCA infarct with hypodensity in teh Lt middle frontal gyrus extending into the Lt parietal and Lt occipital lobe.  Linear hyperdensity in the Lt sylvian fissure likely represents small SAH.  Repeat MRI showed large Lt evolving MCA infarct with evidence of hemorrhagic conversion as well as some watershed involvement in the ACA, PCA, and LT ACA.   PMH includes:  TBI, neuropathy, LVH, HTN, Dilated cardiomyopathy, DM, chronic diastolic CHF.  ETOH abuse.    OT comments  Pt making steady progress towards OT goals this session. Session focus on functional mobility as precursor to higher level ADLs. Overall, pt required MAX A for bed mobility and MOD A +2 to sit<>stand to stedy x2. Pt with improved sitting balance EOB needing MIN A- min guard, however once sitting on stedy pt required MAX A for sitting balance presenting with R lateral lean. Pt able to follow commands with increased time, but unable to communicate intelligibly with therapists. DC plan remains appropriate, will follow acutely per POC.    Follow Up Recommendations  SNF    Equipment Recommendations  None recommended by OT    Recommendations for Other Services      Precautions / Restrictions Precautions Precautions: Fall Precaution Comments: Lt pusher Restrictions Weight Bearing Restrictions: No       Mobility Bed Mobility Overal bed mobility: Needs Assistance Bed Mobility: Supine to Sit     Supine to sit: HOB elevated;Max assist;+2 for physical assistance     General bed mobility comments: pt continues to requrie MAX A  +2 for bed mobility; pt initiating advancing LLE to EOB but required assist for RLE and to elevate trunk into sittin g  Transfers Overall transfer level: Needs assistance Equipment used: Ambulation equipment used Transfers: Sit to/from Stand Sit to Stand: Mod assist;+2 physical assistance         General transfer comment: pt required MOD A +2 for x2 sit<>stands to stedy; pt required assist to position RUE on stedy and required tactile cues to extend hips into full stand    Balance Overall balance assessment: Needs assistance Sitting-balance support: Feet supported;Bilateral upper extremity supported Sitting balance-Leahy Scale: Poor Sitting balance - Comments: pt with improved sitting balance EOB able to sit EOB with min guard however once in stedy pt required MAX A for sitting balance d/t R lateral lean Postural control: Right lateral lean Standing balance support: Bilateral upper extremity supported Standing balance-Leahy Scale: Poor Standing balance comment: reliant on BUE support; R lateral lean                           ADL either performed or assessed with clinical judgement   ADL Overall ADL's : Needs assistance/impaired                         Toilet Transfer: Moderate assistance;+2 for physical assistance Toilet Transfer Details (indicate cue type and reason): sit<>stand only from stedy x2         Functional mobility during ADLs: Moderate assistance;Maximal assistance;+2 for safety/equipment;+2 for physical assistance (  stedy) General ADL Comments: pt with improved sitting balance EOB however continues to present with poor sitting balance leaning R and laterally once in stedy. Pt required MIN guard for sitting balance EOB and MAX A for sitting balance in stedy. MOD A+2 to come to stand to stedy. Pt able to follow commands but unable to communicate purposefully with OTA     Vision Baseline Vision/History:  (pt continues to present with L gaze  preference; unable to trach past midline)     Perception     Praxis      Cognition Arousal/Alertness: Awake/alert Behavior During Therapy: Flat affect   Area of Impairment: Attention;Problem solving;Following commands                   Current Attention Level: Focused   Following Commands: Follows one step commands with increased time     Problem Solving: Slow processing;Decreased initiation;Requires verbal cues;Requires tactile cues General Comments: increased time/repetition/multimodal cues needed for mobility tasks        Exercises     Shoulder Instructions       General Comments      Pertinent Vitals/ Pain       Pain Assessment: Faces Faces Pain Scale: Hurts a little bit Pain Location: grimacing with general mobility Pain Descriptors / Indicators: Grimacing Pain Intervention(s): Limited activity within patient's tolerance;Monitored during session;Repositioned  Home Living                                          Prior Functioning/Environment              Frequency  Min 2X/week        Progress Toward Goals  OT Goals(current goals can now be found in the care plan section)  Progress towards OT goals: Progressing toward goals  Acute Rehab OT Goals Patient Stated Goal: patient unable to state, no family present OT Goal Formulation: Patient unable to participate in goal setting Time For Goal Achievement: 08/26/19 Potential to Achieve Goals: Good  Plan Discharge plan remains appropriate;Frequency remains appropriate    Co-evaluation                 AM-PAC OT "6 Clicks" Daily Activity     Outcome Measure   Help from another person eating meals?: Total Help from another person taking care of personal grooming?: Total Help from another person toileting, which includes using toliet, bedpan, or urinal?: Total Help from another person bathing (including washing, rinsing, drying)?: Total Help from another person to  put on and taking off regular upper body clothing?: Total Help from another person to put on and taking off regular lower body clothing?: Total 6 Click Score: 6    End of Session Equipment Utilized During Treatment: Gait belt;Other (comment) (stedy)  OT Visit Diagnosis: Hemiplegia and hemiparesis Hemiplegia - Right/Left: Right Hemiplegia - dominant/non-dominant: Dominant Hemiplegia - caused by: Cerebral infarction   Activity Tolerance Patient tolerated treatment well   Patient Left in chair;with call bell/phone within reach;with chair alarm set   Nurse Communication Mobility status        Time: 5465-6812 OT Time Calculation (min): 31 min  Charges: OT General Charges $OT Visit: 1 Visit OT Treatments $Therapeutic Activity: 23-37 mins  Lanier Clam., COTA/L Acute Rehabilitation Services [phone]    Ihor Gully 08/24/2019, 2:24 PM

## 2019-08-24 NOTE — TOC Transition Note (Signed)
Transition of Care [facility]) - CM/SW Discharge Note   Patient Details  Name: Malik [name] MRN: [id number] Date of Birth: 07-10-1945  [facility] CM/SW Contact:  Pollie Friar, RN Phone Number: 08/24/2019, 2:41 PM   Clinical Narrative:    Pt discharging to [facility] today. Pt to transport via [facility]. Report called to [facility] and d/c packet at the desk and bedside RN updated.   Room: 604 Number for report: [phone]   Final next level of care: Skilled Nursing Facility Barriers to Discharge: No Barriers Identified   Patient Goals and CMS Choice   CMS Medicare.gov Compare Post Acute Care list provided to:: Patient Represenative (must comment) Choice offered to / list presented to : Adult Children  Discharge Placement              Patient chooses bed at: [facility] Patient to be transferred to facility by: [place] Name of family member notified: Perlie (son) Patient and family notified of of transfer: 08/24/19  Discharge Plan and Services In-house Referral: Clinical Social Work Discharge Planning Services: CM Consult Post Acute Care Choice: [place]                               Social Determinants of Health (SDOH) Interventions     Readmission Risk Interventions No flowsheet data found.

## 2019-09-23 ENCOUNTER — Other Ambulatory Visit: Payer: Self-pay | Admitting: Internal Medicine

## 2019-09-23 DIAGNOSIS — E041 Nontoxic single thyroid nodule: Secondary | ICD-10-CM

## 2019-09-23 DIAGNOSIS — E049 Nontoxic goiter, unspecified: Secondary | ICD-10-CM

## 2019-09-28 ENCOUNTER — Inpatient Hospital Stay (HOSPITAL_COMMUNITY)
Admission: EM | Admit: 2019-09-28 | Discharge: 2019-10-07 | DRG: 377 | Disposition: A | Discharge status: Skilled Nursing Facility | Payer: Medicare Other | Source: Skilled Nursing Facility | Attending: Internal Medicine | Admitting: Internal Medicine

## 2019-09-28 ENCOUNTER — Inpatient Hospital Stay: Payer: Self-pay | Admitting: Adult Health

## 2019-09-28 ENCOUNTER — Other Ambulatory Visit: Payer: Self-pay

## 2019-09-28 ENCOUNTER — Encounter (HOSPITAL_COMMUNITY): Payer: Self-pay | Admitting: Emergency Medicine

## 2019-09-28 DIAGNOSIS — F1010 Alcohol abuse, uncomplicated: Secondary | ICD-10-CM | POA: Diagnosis present

## 2019-09-28 DIAGNOSIS — Z6828 Body mass index (BMI) 28.0-28.9, adult: Secondary | ICD-10-CM

## 2019-09-28 DIAGNOSIS — Z7189 Other specified counseling: Secondary | ICD-10-CM

## 2019-09-28 DIAGNOSIS — Z7289 Other problems related to lifestyle: Secondary | ICD-10-CM

## 2019-09-28 DIAGNOSIS — Z8249 Family history of ischemic heart disease and other diseases of the circulatory system: Secondary | ICD-10-CM

## 2019-09-28 DIAGNOSIS — I69391 Dysphagia following cerebral infarction: Secondary | ICD-10-CM

## 2019-09-28 DIAGNOSIS — K922 Gastrointestinal hemorrhage, unspecified: Secondary | ICD-10-CM | POA: Diagnosis not present

## 2019-09-28 DIAGNOSIS — I5032 Chronic diastolic (congestive) heart failure: Secondary | ICD-10-CM | POA: Diagnosis not present

## 2019-09-28 DIAGNOSIS — R451 Restlessness and agitation: Secondary | ICD-10-CM | POA: Diagnosis not present

## 2019-09-28 DIAGNOSIS — G9341 Metabolic encephalopathy: Secondary | ICD-10-CM | POA: Diagnosis not present

## 2019-09-28 DIAGNOSIS — K5521 Angiodysplasia of colon with hemorrhage: Principal | ICD-10-CM | POA: Diagnosis present

## 2019-09-28 DIAGNOSIS — Z8673 Personal history of transient ischemic attack (TIA), and cerebral infarction without residual deficits: Secondary | ICD-10-CM

## 2019-09-28 DIAGNOSIS — Z7982 Long term (current) use of aspirin: Secondary | ICD-10-CM

## 2019-09-28 DIAGNOSIS — D631 Anemia in chronic kidney disease: Secondary | ICD-10-CM | POA: Diagnosis present

## 2019-09-28 DIAGNOSIS — D509 Iron deficiency anemia, unspecified: Secondary | ICD-10-CM | POA: Diagnosis present

## 2019-09-28 DIAGNOSIS — Z20822 Contact with and (suspected) exposure to covid-19: Secondary | ICD-10-CM | POA: Diagnosis present

## 2019-09-28 DIAGNOSIS — Z79899 Other long term (current) drug therapy: Secondary | ICD-10-CM

## 2019-09-28 DIAGNOSIS — D6959 Other secondary thrombocytopenia: Secondary | ICD-10-CM | POA: Diagnosis present

## 2019-09-28 DIAGNOSIS — I69312 Visuospatial deficit and spatial neglect following cerebral infarction: Secondary | ICD-10-CM

## 2019-09-28 DIAGNOSIS — G9340 Encephalopathy, unspecified: Secondary | ICD-10-CM

## 2019-09-28 DIAGNOSIS — Z87891 Personal history of nicotine dependence: Secondary | ICD-10-CM

## 2019-09-28 DIAGNOSIS — I420 Dilated cardiomyopathy: Secondary | ICD-10-CM | POA: Diagnosis present

## 2019-09-28 DIAGNOSIS — N1831 Chronic kidney disease, stage 3a: Secondary | ICD-10-CM | POA: Diagnosis present

## 2019-09-28 DIAGNOSIS — K449 Diaphragmatic hernia without obstruction or gangrene: Secondary | ICD-10-CM | POA: Diagnosis present

## 2019-09-28 DIAGNOSIS — I69322 Dysarthria following cerebral infarction: Secondary | ICD-10-CM

## 2019-09-28 DIAGNOSIS — N1832 Chronic kidney disease, stage 3b: Secondary | ICD-10-CM | POA: Diagnosis not present

## 2019-09-28 DIAGNOSIS — N183 Chronic kidney disease, stage 3 unspecified: Secondary | ICD-10-CM | POA: Diagnosis present

## 2019-09-28 DIAGNOSIS — D649 Anemia, unspecified: Secondary | ICD-10-CM | POA: Diagnosis not present

## 2019-09-28 DIAGNOSIS — E1122 Type 2 diabetes mellitus with diabetic chronic kidney disease: Secondary | ICD-10-CM | POA: Diagnosis present

## 2019-09-28 DIAGNOSIS — Z9081 Acquired absence of spleen: Secondary | ICD-10-CM

## 2019-09-28 DIAGNOSIS — I10 Essential (primary) hypertension: Secondary | ICD-10-CM | POA: Diagnosis present

## 2019-09-28 DIAGNOSIS — E669 Obesity, unspecified: Secondary | ICD-10-CM | POA: Diagnosis present

## 2019-09-28 DIAGNOSIS — Z515 Encounter for palliative care: Secondary | ICD-10-CM

## 2019-09-28 DIAGNOSIS — Z7902 Long term (current) use of antithrombotics/antiplatelets: Secondary | ICD-10-CM

## 2019-09-28 DIAGNOSIS — Z794 Long term (current) use of insulin: Secondary | ICD-10-CM

## 2019-09-28 DIAGNOSIS — E785 Hyperlipidemia, unspecified: Secondary | ICD-10-CM | POA: Diagnosis present

## 2019-09-28 DIAGNOSIS — I130 Hypertensive heart and chronic kidney disease with heart failure and stage 1 through stage 4 chronic kidney disease, or unspecified chronic kidney disease: Secondary | ICD-10-CM | POA: Diagnosis present

## 2019-09-28 DIAGNOSIS — E119 Type 2 diabetes mellitus without complications: Secondary | ICD-10-CM

## 2019-09-28 DIAGNOSIS — I272 Pulmonary hypertension, unspecified: Secondary | ICD-10-CM | POA: Diagnosis present

## 2019-09-28 DIAGNOSIS — I69320 Aphasia following cerebral infarction: Secondary | ICD-10-CM

## 2019-09-28 DIAGNOSIS — Z87820 Personal history of traumatic brain injury: Secondary | ICD-10-CM

## 2019-09-28 DIAGNOSIS — E1140 Type 2 diabetes mellitus with diabetic neuropathy, unspecified: Secondary | ICD-10-CM | POA: Diagnosis present

## 2019-09-28 DIAGNOSIS — I340 Nonrheumatic mitral (valve) insufficiency: Secondary | ICD-10-CM | POA: Diagnosis present

## 2019-09-28 DIAGNOSIS — G4733 Obstructive sleep apnea (adult) (pediatric): Secondary | ICD-10-CM | POA: Diagnosis present

## 2019-09-28 LAB — COMPREHENSIVE METABOLIC PANEL
ALT: 17 U/L (ref 0–44)
AST: 26 U/L (ref 15–41)
Albumin: 3.4 g/dL — ABNORMAL LOW (ref 3.5–5.0)
Alkaline Phosphatase: 79 U/L (ref 38–126)
Anion gap: 9 (ref 5–15)
BUN: 13 mg/dL (ref 8–23)
CO2: 23 mmol/L (ref 22–32)
Calcium: 9.2 mg/dL (ref 8.9–10.3)
Chloride: 107 mmol/L (ref 98–111)
Creatinine, Ser: 1.01 mg/dL (ref 0.61–1.24)
GFR calc Af Amer: >60 mL/min (ref >60)
GFR calc non Af Amer: >60 mL/min (ref >60)
Glucose, Bld: 113 mg/dL — ABNORMAL HIGH (ref 70–99)
Potassium: 4.3 mmol/L (ref 3.5–5.1)
Sodium: 139 mmol/L (ref 135–145)
Total Bilirubin: 0.4 mg/dL (ref 0.3–1.2)
Total Protein: 7.7 g/dL (ref 6.5–8.1)

## 2019-09-28 LAB — I-STAT CHEM 8, ED
BUN: 14 mg/dL (ref 8–23)
Calcium, Ion: 1.17 mmol/L (ref 1.15–1.40)
Chloride: 104 mmol/L (ref 98–111)
Creatinine, Ser: 1 mg/dL (ref 0.61–1.24)
Glucose, Bld: 118 mg/dL — ABNORMAL HIGH (ref 70–99)
HCT: 28 % — ABNORMAL LOW (ref 39.0–52.0)
Hemoglobin: 9.5 g/dL — ABNORMAL LOW (ref 13.0–17.0)
Potassium: 3.9 mmol/L (ref 3.5–5.1)
Sodium: 140 mmol/L (ref 135–145)
TCO2: 23 mmol/L (ref 22–32)

## 2019-09-28 LAB — CBC WITH DIFFERENTIAL/PLATELET
Abs Immature Granulocytes: 0.02 10*3/uL (ref 0.00–0.07)
Basophils Absolute: 0.1 10*3/uL (ref 0.0–0.1)
Basophils Relative: 1 %
Eosinophils Absolute: 0.6 10*3/uL — ABNORMAL HIGH (ref 0.0–0.5)
Eosinophils Relative: 6 %
HCT: 21.8 % — ABNORMAL LOW (ref 39.0–52.0)
Hemoglobin: 6.4 g/dL — CL (ref 13.0–17.0)
Immature Granulocytes: 0 %
Lymphocytes Relative: 32 %
Lymphs Abs: 3.1 10*3/uL (ref 0.7–4.0)
MCH: 22.5 pg — ABNORMAL LOW (ref 26.0–34.0)
MCHC: 29.4 g/dL — ABNORMAL LOW (ref 30.0–36.0)
MCV: 76.8 fL — ABNORMAL LOW (ref 80.0–100.0)
Monocytes Absolute: 0.9 10*3/uL (ref 0.1–1.0)
Monocytes Relative: 9 %
Neutro Abs: 5.2 10*3/uL (ref 1.7–7.7)
Neutrophils Relative %: 52 %
Platelets: 517 10*3/uL — ABNORMAL HIGH (ref 150–400)
RBC: 2.84 MIL/uL — ABNORMAL LOW (ref 4.22–5.81)
RDW: 24 % — ABNORMAL HIGH (ref 11.5–15.5)
WBC: 9.9 10*3/uL (ref 4.0–10.5)
nRBC: 0.3 % — ABNORMAL HIGH (ref 0.0–0.2)

## 2019-09-28 LAB — PREPARE RBC (CROSSMATCH)

## 2019-09-28 LAB — SARS CORONAVIRUS 2 BY RT PCR (HOSPITAL ORDER, PERFORMED IN ~~LOC~~ HOSPITAL LAB): SARS Coronavirus 2: NEGATIVE

## 2019-09-28 LAB — POC OCCULT BLOOD, ED: Fecal Occult Bld: POSITIVE — AB

## 2019-09-28 MED ORDER — SODIUM CHLORIDE 0.9 % IV SOLN
80.0000 mg | Freq: Once | INTRAVENOUS | Status: AC
  Administered 2019-09-29: 80 mg via INTRAVENOUS
  Filled 2019-09-28: qty 80

## 2019-09-28 MED ORDER — LORAZEPAM 2 MG/ML IJ SOLN
1.0000 mg | INTRAMUSCULAR | Status: AC | PRN
  Administered 2019-09-30 (×4): 1 mg via INTRAVENOUS
  Filled 2019-09-28 (×6): qty 1

## 2019-09-28 MED ORDER — PANTOPRAZOLE SODIUM 40 MG IV SOLR
40.0000 mg | Freq: Two times a day (BID) | INTRAVENOUS | Status: DC
  Filled 2019-09-28: qty 40

## 2019-09-28 MED ORDER — LORAZEPAM 1 MG PO TABS
1.0000 mg | ORAL_TABLET | ORAL | Status: AC | PRN
  Administered 2019-09-30: 2 mg via ORAL

## 2019-09-28 MED ORDER — SODIUM CHLORIDE 0.9% IV SOLUTION: Freq: Once | INTRAVENOUS | Status: AC

## 2019-09-28 NOTE — H&P (Signed)
History and Physical    Malik [name] GNF:[id number] DOB: 28-Apr-1945 DOA: 09/28/2019  PCP: [facility], [place], NP  Patient coming from: Rehab and SNF  I have personally briefly reviewed patient's old medical records in [place]  Chief Complaint: Acute anemia HPI: Malik [name] is a 74 y.o. male with medical history significant for TBI with aphasia, recent CVA with right upper extremity hemiplegia on dual antiplatelet therapy, history of iron deficiency anemia, type 2 diabetes, CKD stage IIIa, hypertension, OSA, hyperlipidemia and alcohol abuse who presents with concerns of acute anemia.  Patient received lab work at his nursing facility and was noted to have hemoglobin of 6.4 with baseline hemoglobin of 7-8.    He was started on Plavix and aspirin in June due to a new left MCA infarct and also started on oral iron supplement for iron deficiency anemia.  Patient found to be Hemoccult positive during this admission.  He follows with Dr. Clide Deutscher at [facility] [place].  Reportedly had heme positive stool back in 2019 without any intervention.  Unsure of last colonoscopy.  In the ED, he was afebrile and hypertensive with systolic up to 846N.  Initially his hemoglobin came back around 9 but on repeat it was 6.4 and patient and his son agreed to have 1 unit of PRBC transfusion. Creatinine and BUN normal.   Review of Systems:  Unable to obtain given patient's aphasia  Past Medical History:  Diagnosis Date  . Angina of effort ([place])   . Chronic diastolic CHF (congestive heart failure) ([place])   . Diabetes ([place])   . Diabetes mellitus without complication ([place])   . Dilated cardiomyopathy ([place])   . Heart disease   . High blood pressure   . Hypertension   . 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 Moderate mitral insufficiency   . Neuropathy    with LE weakness  . Sleep apnea   . TBI (traumatic brain injury) [facility])     Past Surgical  History:  Procedure Laterality Date  . BACK SURGERY    . IR CT HEAD LTD  08/10/2019  . IR PERCUTANEOUS ART THROMBECTOMY/INFUSION INTRACRANIAL INC DIAG ANGIO  08/10/2019  . RADIOLOGY WITH ANESTHESIA N/A 08/10/2019   Procedure: IR WITH ANESTHESIA -CODE STROKE;  Surgeon: Radiologist, Medication, MD;  Location: [place];  Service: Radiology;  Laterality: N/A;  . 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 His smoking use included cigarettes. He has a 45.00 pack-year smoking history. He has never used smokeless tobacco. He reports current alcohol use. He reports that he does not use drugs.  No Known Allergies  Family History  Problem Relation Age of Onset  . Hypertension Mother   . Hypertension Other      Prior to Admission medications   Medication Sig Start Date End Date Taking? Authorizing Provider  acetaminophen (TYLENOL) 325 MG tablet Take 650 mg by mouth every 8 (eight) hours.   Yes [provider]  amLODipine (NORVASC) 10 MG tablet Take 1 tablet (10 mg total) by mouth daily. 06/06/17  Yes Love, Ivan Anchors, PA-C  ascorbic acid (VITAMIN C) 500 MG tablet Take 500 mg by mouth 2 (two) times daily.   Yes [provider]  aspirin EC 325 MG EC tablet Take 1 tablet (325 mg total)  by mouth daily. 08/25/19  Yes Donzetta Starch, NP  clopidogrel (PLAVIX) 75 MG tablet Take 1 tablet (75 mg total) by mouth daily. STOP TAKING AFTER 3 MONTHS AND CONTINUE ASPIRIN ALONE 08/25/19 11/23/19 Yes Donzetta Starch, NP  feeding supplement, ENSURE ENLIVE, (ENSURE ENLIVE) LIQD Take 237 mLs by mouth 3 (three) times daily between meals. 08/24/19  Yes Donzetta Starch, NP  ferrous sulfate 300 (60 Fe) MG/5ML syrup Take 300 mg by mouth 3 (three) times daily.   Yes [provider]  folic acid (FOLVITE) 1 MG tablet Take 1 tablet (1 mg total) by mouth  daily. 06/06/17  Yes Love, Ivan Anchors, PA-C  insulin lispro (HUMALOG) 100 UNIT/ML injection Inject 2-10 Units into the skin 3 (three) times daily before meals. Per sliding scale, If blood sugar is: 151-200:2 units 201-250:4 units 251-300:6 units 301-350:8 units 351-400:10 units If blood sugar is greater than 400, call NP/PA.   Yes [provider]  metFORMIN (GLUCOPHAGE) 500 MG tablet Take 500 mg by mouth 2 (two) times daily with a meal.   Yes [provider]  metoprolol tartrate (LOPRESSOR) 25 MG tablet Take 1 tablet (25 mg total) by mouth 2 (two) times daily. 08/24/19  Yes Donzetta Starch, NP  pantoprazole (PROTONIX) 40 MG tablet Take 1 tablet (40 mg total) by mouth daily. 06/07/17  Yes Love, Ivan Anchors, PA-C  thiamine 100 MG tablet Take 1 tablet (100 mg total) by mouth daily. 06/06/17  Yes Love, Ivan Anchors, PA-C  atorvastatin (LIPITOR) 80 MG tablet Take 1 tablet (80 mg total) by mouth daily. Patient not taking: Reported on 09/28/2019 08/25/19   Donzetta Starch, NP  ferrous sulfate 325 (65 FE) MG tablet Take 1 tablet (325 mg total) by mouth 3 (three) times daily with meals. Patient not taking: Reported on 09/28/2019 08/24/19   Donzetta Starch, NP  Maltodextrin-Xanthan Gum (RESOURCE THICKENUP CLEAR) POWD Take 120 g by mouth as needed (for nectar thick liquids). Patient not taking: Reported on 09/28/2019 08/24/19   Donzetta Starch, NP  multivitamin (PROSIGHT) TABS tablet Take 1 tablet by mouth daily. Patient not taking: Reported on 09/28/2019 08/25/19   Donzetta Starch, NP  silver sulfADIAZINE (SILVADENE) 1 % cream Apply topically daily. Patient not taking: Reported on 09/28/2019 08/25/19   Donzetta Starch, NP    Physical Exam: Vitals:   09/28/19 2100 09/28/19 2228 09/28/19 2235 09/28/19 2252  BP: (!) 160/87 (!) 189/85  (!) 177/83  Pulse: 66 68 72 68  Resp: 15 19 17 17   Temp:  98.1 F (36.7 C) 98.1 F (36.7 C) 98.2 F (36.8 C)  TempSrc:   Oral Oral  SpO2: 98% 99% 100% 100%  Weight:       Height:        Constitutional: NAD, calm, comfortable, elderly male laying flat in bed Vitals:   09/28/19 2100 09/28/19 2228 09/28/19 2235 09/28/19 2252  BP: (!) 160/87 (!) 189/85  (!) 177/83  Pulse: 66 68 72 68  Resp: 15 19 17 17   Temp:  98.1 F (36.7 C) 98.1 F (36.7 C) 98.2 F (36.8 C)  TempSrc:   Oral Oral  SpO2: 98% 99% 100% 100%  Weight:      Height:       Eyes: PERRL, lids and conjunctivae normal ENMT: Mucous membranes are moist.  Neck: normal, supple Respiratory: clear to auscultation bilaterally, no wheezing, no crackles. Normal respiratory effort.  Cardiovascular: Regular rate and rhythm, no murmurs / rubs /  gallops. No extremity edema.  Abdomen: no tenderness, no masses palpated.  Bowel sounds positive.  Rectal: No bright red blood per rectum. Musculoskeletal: no clubbing / cyanosis. No joint deformity upper and lower extremities. no contractures. Normal muscle tone.  Skin: no rashes, lesions, ulcers. No induration Neurologic: Patient alert but unable to assess for orientation since he is aphasic.  Patient able to answer yes or no question although sometimes unsure whether he actually understands the question at times. Able to follow commands with hand grip and lifting lower extremity.  Unable to lift right upper extremity due to handgrip due to residual hemiplegia from previous stroke. Psychiatric: Normal judgment and insight. Alert and oriented x 3. Normal mood.     Labs on Admission: I have personally reviewed following labs and imaging studies  CBC: Recent Labs  Lab 09/28/19 2115 09/28/19 2139  WBC 9.9  --   NEUTROABS 5.2  --   HGB 6.4* 9.5*  HCT 21.8* 28.0*  MCV 76.8*  --   PLT 517*  --    Basic Metabolic Panel: Recent Labs  Lab 09/28/19 1906 09/28/19 2139  NA 139 140  K 4.3 3.9  CL 107 104  CO2 23  --   GLUCOSE 113* 118*  BUN 13 14  CREATININE 1.01 1.00  CALCIUM 9.2  --    GFR: Estimated Creatinine Clearance: 74.4 mL/min (by C-G formula  based on SCr of 1 mg/dL). Liver Function Tests: Recent Labs  Lab 09/28/19 1906  AST 26  ALT 17  ALKPHOS 79  BILITOT 0.4  PROT 7.7  ALBUMIN 3.4*   No results for input(s): LIPASE, AMYLASE in the last 168 hours. No results for input(s): AMMONIA in the last 168 hours. Coagulation Profile: No results for input(s): INR, PROTIME in the last 168 hours. Cardiac Enzymes: No results for input(s): CKTOTAL, CKMB, CKMBINDEX, TROPONINI in the last 168 hours. BNP (last 3 results) No results for input(s): PROBNP in the last 8760 hours. HbA1C: No results for input(s): HGBA1C in the last 72 hours. CBG: No results for input(s): GLUCAP in the last 168 hours. Lipid Profile: No results for input(s): CHOL, HDL, LDLCALC, TRIG, CHOLHDL, LDLDIRECT in the last 72 hours. Thyroid Function Tests: No results for input(s): TSH, T4TOTAL, FREET4, T3FREE, THYROIDAB in the last 72 hours. Anemia Panel: No results for input(s): VITAMINB12, FOLATE, FERRITIN, TIBC, IRON, RETICCTPCT in the last 72 hours. Urine analysis:    Component Value Date/Time   COLORURINE STRAW (A) 08/11/2019 [place] 08/11/2019 0343   APPEARANCEUR Clear 06/11/2011 2046   LABSPEC 1.023 08/11/2019 0343   LABSPEC 1.025 06/11/2011 2046   PHURINE 6.0 08/11/2019 0343   GLUCOSEU >=500 (A) 08/11/2019 0343   GLUCOSEU Negative 06/11/2011 2046   HGBUR SMALL (A) 08/11/2019 0343   BILIRUBINUR NEGATIVE 08/11/2019 0343   BILIRUBINUR Negative 06/11/2011 2046   [place] NEGATIVE 08/11/2019 0343   PROTEINUR 30 (A) 08/11/2019 0343   NITRITE NEGATIVE 08/11/2019 0343   LEUKOCYTESUR LARGE (A) 08/11/2019 0343   LEUKOCYTESUR Negative 06/11/2011 2046    Radiological Exams on Admission: No results found.    Assessment/Plan  Acute anemia with history of iron deficiency anemia and CVA on DAPT Now with FOBT positive result.  No bright red blood per rectum on exam.  Hemodynamically stable. Suspect could be GI bleed from newly started  dual antiplatelet therapy in June Normal baseline hemoglobin around 7-8 Normally follows with GI to neuro clinic in [place].  Unknown date of last colonoscopy. Will transfuse  1 unit PRBC and repeat CBC following transfusion and also repeat in the morning. Daily IV PPI Will message GI in Epic to evaluate in the morning Keep NPO  History of CVA and TBI Aphasic at baseline with right upper extremity hemiplegia Hold aspirin and Plavix due to acute worsening anemia  Chronic diastolic heart failure Patient does not appear euvolemic.  Continue to monitor fluid status with transfusion.  CKD stage IIIa Stable creatinine.  Avoid nephrotoxic agent.  Type 2 diabetes Controlled with A1c of 5.4  Hypertension Continue metoprolol and amlodipine  Hyperlipidemia Continue statin  Alcohol abuse Noted to be a heavy drinker in prior admissions.  Will check alcohol level. will place on CIWA protocol for precaution   Status is: Observation  The patient remains OBS appropriate and will d/c before 2 midnights.  Dispo: The patient is from: Home              Anticipated d/c is to: Home              Anticipated d/c date is: 1 day              Patient currently is not medically stable to d/c.         Orene Desanctis DO [facility]   If 7PM-7AM, please contact night-coverage [email or web address]   09/28/2019, 11:33 PM

## 2019-09-28 NOTE — ED Provider Notes (Signed)
Malik [name] is a 74 y.o. male, presenting to the ED with low hemoglobin level.  HPI from [place], PA-C: "Malik [name] is a 74 y.o. male history of CHF, diabetes, dilated cardiomyopathy, hypertension, sleep apnea, TBI, CVA, alcohol abuse.  History obtained from patient: Denies pain reports he is feeling well.  Level 5 caveat dysarthria secondary to CVA/TBI.  History obtained from triage nurse: Patient sent today from nursing home for anemia, hemoglobin 6.4, no other concerns.  History obtained from patient's son Malik [name].  Reports patient his normal state of health, he visits the patient every day.  Reports mental status is currently at baseline, patient has been eating normally and performing his normal daily activities.  He was called by nursing home today and informed of low hemoglobin and iron levels.  Denies recent illness, vomiting, falls or injury or mental status changes."  Past Medical History:  Diagnosis Date  . Angina of effort ([place])   . Chronic diastolic CHF (congestive heart failure) ([place])   . Diabetes ([place])   . Diabetes mellitus without complication ([place])   . Dilated cardiomyopathy ([place])   . Heart disease   . High blood pressure   . Hypertension   . 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 Moderate mitral insufficiency   . Neuropathy    with LE weakness  . Sleep apnea   . TBI (traumatic brain injury) ([place])      Physical Exam  BP (!) 160/87   Pulse 66   Temp 99.3 F (37.4 C) (Rectal)   Resp 15   Ht 5\' 10"  (1.778 m)   Wt 96.5 kg   SpO2 98%   BMI 30.53 kg/m   Physical Exam Vitals and nursing note reviewed.  Constitutional:      General: He is not in acute distress.    Appearance: He is well-developed. He is not diaphoretic.  HENT:     Head: Normocephalic and atraumatic.     Mouth/Throat:     Mouth: Mucous membranes are moist.     Pharynx: Oropharynx is clear.  Eyes:     Conjunctiva/sclera: Conjunctivae  normal.  Cardiovascular:     Rate and Rhythm: Normal rate and regular rhythm.     Pulses: Normal pulses.          Radial pulses are 2+ on the right side and 2+ on the left side.       Posterior tibial pulses are 2+ on the right side and 2+ on the left side.     Heart sounds: Normal heart sounds.     Comments: Tactile temperature in the extremities appropriate and equal bilaterally. Pulmonary:     Effort: Pulmonary effort is normal. No respiratory distress.     Breath sounds: Normal breath sounds.  Abdominal:     Palpations: Abdomen is soft.     Tenderness: There is no abdominal tenderness. There is no guarding.  Musculoskeletal:     Cervical back: Neck supple.     Right lower leg: No edema.     Left lower leg: No edema.  Lymphadenopathy:     Cervical: No cervical adenopathy.  Skin:    General: Skin is warm and dry.  Neurological:     Mental Status: He is alert.     Comments: Patient can answer yes or no questions, however, he has great difficulty speaking due to his underlying aphasia.  Psychiatric:        Mood and Affect: Mood and affect  normal.        Speech: Speech normal.        Behavior: Behavior normal.     ED Course/Procedures     Procedures   Abnormal Labs Reviewed  COMPREHENSIVE METABOLIC PANEL - Abnormal; Notable for the following components:      Result Value   Glucose, Bld 113 (*)    Albumin 3.4 (*)    All other components within normal limits  CBC WITH DIFFERENTIAL/PLATELET - Abnormal; Notable for the following components:   RBC 2.84 (*)    Hemoglobin 6.4 (*)    HCT 21.8 (*)    MCV 76.8 (*)    MCH 22.5 (*)    MCHC 29.4 (*)    RDW 24.0 (*)    Platelets 517 (*)    nRBC 0.3 (*)    Eosinophils Absolute 0.6 (*)    All other components within normal limits  POC OCCULT BLOOD, ED - Abnormal; Notable for the following components:   Fecal Occult Bld POSITIVE (*)    All other components within normal limits  I-STAT CHEM 8, ED - Abnormal; Notable for the  following components:   Glucose, Bld 118 (*)    Hemoglobin 9.5 (*)    HCT 28.0 (*)    All other components within normal limits    Hemoglobin  Date Value Ref Range Status  09/28/2019 9.5 (L) 13.0 - 17.0 g/dL Final  09/28/2019 6.4 (LL) 13.0 - 17.0 g/dL Final    Comment:    REPEATED TO VERIFY Reticulocyte Hemoglobin testing may be clinically indicated, consider ordering this additional test [id number] THIS CRITICAL RESULT HAS VERIFIED AND BEEN CALLED TO J.BARWICK,RN BY MELISSA BROGDON ON 07 20 2021 AT 2157, AND HAS BEEN READ BACK.    08/24/2019 7.9 (L) 13.0 - 17.0 g/dL Final    Comment:    Reticulocyte Hemoglobin testing may be clinically indicated, consider ordering this additional test [id number]   08/23/2019 8.3 (L) 13.0 - 17.0 g/dL Final    Comment:    Reticulocyte Hemoglobin testing may be clinically indicated, consider ordering this additional test [id number]    HGB  Date Value Ref Range Status  06/11/2011 13.2 13.0 - 18.0 g/dL Final      MDM    Clinical Course as of Sep 28 2246  Tue Sep 28, 2019  2201 Malik [name],Malik [name] (Son)  [phone] [facility] Phone)   [BM]  2245 Spoke with Dr. Flossie Buffy, hospitalist. Agrees to admit the patient.  She will make contact with GI.   [SJ]    Clinical Course User Index [BM] Deliah Boston, PA-C [SJ] Erasmo Vertz C, PA-C   Level 5 caveat due to aphasia from recent stroke.  Patient care handoff report received from [facility], [place]. Plan: Admit patient.    Patient presents from SNF due to report of low hemoglobin. Patient denies symptoms or complaints. Patient is nontoxic appearing, afebrile, not tachycardic, not tachypneic, not hypotensive, maintains excellent SPO2 on room air, and is in no apparent distress.   I have reviewed the patient's chart to obtain more information.   I reviewed and interpreted the patient's labs and radiological studies. Hemoglobin at 6.4 here.  Results were delayed due to initial sample  reportedly hemolyzed.  Hemoccult positive.  Patient on aspirin/Plavix. No abdominal pain or tenderness. Received consent for blood transfusion from both patient and his son.  Plan to admit patient. Patient followed by Dr. Mikle Bosworth, GI out of [facility].     Vitals:   09/28/19 1937  09/28/19 2100 09/28/19 2228 09/28/19 2235  BP: (!) 169/73 (!) 160/87 (!) 189/85   Pulse: 70 66 68 72  Resp: 18 15 19 17   Temp:   98.1 F (36.7 C) 98.1 F (36.7 C)  TempSrc:    Oral  SpO2: 100% 98% 99% 100%  Weight:      Height:          Lorayne Bender, PA-C 09/28/19 2248    Margette Fast, MD 09/28/19 [phone]

## 2019-09-28 NOTE — ED Provider Notes (Addendum)
[facility] EMERGENCY DEPARTMENT Provider Note   CSN: [id number] Arrival date & time: 09/28/19  1857     History Chief Complaint  Patient presents with  . Abnormal Lab     Malik [name] is a 74 y.o. male history of CHF, diabetes, dilated cardiomyopathy, hypertension, sleep apnea, TBI, CVA, alcohol abuse.  History obtained from patient: Denies pain reports he is feeling well.  Level 5 caveat dysarthria secondary to CVA/TBI.  History obtained from triage nurse: Patient sent today from nursing home for anemia, hemoglobin 6.4, no other concerns.  History obtained from patient's son Caylor.  Reports patient his normal state of health, he visits the patient every day.  Reports mental status is currently at baseline, patient has been eating normally and performing his normal daily activities.  He was called by nursing home today and informed of low hemoglobin and iron levels.  Denies recent illness, vomiting, falls or injury or mental status changes.  HPI     Past Medical History:  Diagnosis Date  . Angina of effort ([place])   . Chronic diastolic CHF (congestive heart failure) ([place])   . Diabetes ([place])   . Diabetes mellitus without complication ([place])   . Dilated cardiomyopathy ([place])   . Heart disease   . High blood pressure   . Hypertension   . 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 Moderate mitral insufficiency   . Neuropathy    with LE weakness  . Sleep apnea   . TBI (traumatic brain injury) [facility])     Patient Active Problem List   Diagnosis Date Noted  . Hyperlipidemia LDL goal <70 08/24/2019  . Dysphagia due to recent cerebral infarction 08/24/2019  . Severe protein-calorie malnutrition ([place]) 08/24/2019  . Iron deficiency anemia 08/24/2019  . AKI (acute kidney injury) ([place]) 08/24/2019  . CKD (chronic kidney disease), stage IIIa 08/24/2019  . Alcohol abuse 08/24/2019  . Obesity 08/24/2019  . Multinodular thyroid  08/24/2019  . Pressure injury of skin 08/13/2019  . 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 Thrombocytosis ([place])   . Acute lower UTI   . Labile blood pressure   . Hydrothorax   . Sleep disturbance   . Pleural effusion on left   . Hypertensive crisis   . Hypertension   . Leukocytosis   . Benign essential HTN   . Diabetes mellitus type 2 in nonobese (HCC)   . Acute blood loss anemia   . Trauma 05/27/2017  . TBI (traumatic brain injury) ([place])   . Essential hypertension   . Chronic diastolic congestive heart failure ([place])   . 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 Rib fractures 05/10/2017  . 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    Past Surgical History:  Procedure Laterality Date  . BACK SURGERY    . IR CT HEAD LTD  08/10/2019  . IR PERCUTANEOUS ART THROMBECTOMY/INFUSION INTRACRANIAL INC DIAG ANGIO  08/10/2019  . RADIOLOGY WITH ANESTHESIA N/A 08/10/2019   Procedure: IR WITH ANESTHESIA -CODE STROKE;  Surgeon: Radiologist, Medication, MD;  Location: [facility];  Service: Radiology;  Laterality: N/A;  . SPLENECTOMY, TOTAL N/A 05/10/2017   Procedure: TRAUMA EXPLORATORY LAP FOR SPLENECTOMY;  Surgeon: Clovis Riley, MD;  Location: [facility];  Service: General;  Laterality: N/A;       Family History  Problem Relation Age of Onset  . Hypertension Mother   . Hypertension Other     Social History  Tobacco Use  . Smoking status: Former Smoker    Packs/day: 1.50    Years: 30.00    Pack years: 45.00    Types: Cigarettes  . Smokeless tobacco: Never Used  Vaping Use  . Vaping Use: Never used  Substance Use Topics  . Alcohol use: Yes  . Drug use: No    Home Medications Prior to Admission medications   Medication Sig Start Date End Date Taking? Authorizing Provider  amLODipine (NORVASC) 10 MG tablet Take 1 tablet (10 mg total) by mouth daily. Patient not taking: Reported on 08/13/2019 06/06/17   Love, Ivan Anchors, PA-C  aspirin EC 325 MG EC tablet Take 1  tablet (325 mg total) by mouth daily. 08/25/19   Donzetta Starch, NP  atorvastatin (LIPITOR) 80 MG tablet Take 1 tablet (80 mg total) by mouth daily. 08/25/19   Donzetta Starch, NP  clopidogrel (PLAVIX) 75 MG tablet Take 1 tablet (75 mg total) by mouth daily. STOP TAKING AFTER 3 MONTHS AND CONTINUE ASPIRIN ALONE 08/25/19 11/23/19  Donzetta Starch, NP  feeding supplement, ENSURE ENLIVE, (ENSURE ENLIVE) LIQD Take 237 mLs by mouth 3 (three) times daily between meals. 08/24/19   Donzetta Starch, NP  ferrous sulfate 325 (65 FE) MG tablet Take 1 tablet (325 mg total) by mouth 3 (three) times daily with meals. 08/24/19   Donzetta Starch, NP  folic acid (FOLVITE) 1 MG tablet Take 1 tablet (1 mg total) by mouth daily. Patient not taking: Reported on 08/13/2019 06/06/17   Love, Ivan Anchors, PA-C  Maltodextrin-Xanthan Gum (RESOURCE THICKENUP CLEAR) POWD Take 120 g by mouth as needed (for nectar thick liquids). 08/24/19   Donzetta Starch, NP  metoprolol tartrate (LOPRESSOR) 25 MG tablet Take 1 tablet (25 mg total) by mouth 2 (two) times daily. 08/24/19   Donzetta Starch, NP  multivitamin (PROSIGHT) TABS tablet Take 1 tablet by mouth daily. 08/25/19   Donzetta Starch, NP  pantoprazole (PROTONIX) 40 MG tablet Take 1 tablet (40 mg total) by mouth daily. Patient not taking: Reported on 08/13/2019 06/07/17   Love, Ivan Anchors, PA-C  silver sulfADIAZINE (SILVADENE) 1 % cream Apply topically daily. 08/25/19   Donzetta Starch, NP  thiamine 100 MG tablet Take 1 tablet (100 mg total) by mouth daily. Patient not taking: Reported on 08/13/2019 06/06/17   Bary Leriche, PA-C    Allergies    Patient has no known allergies.  Review of Systems   Review of Systems  Unable to perform ROS: Patient nonverbal    Physical Exam Updated Vital Signs BP (!) 160/87   Pulse 66   Temp 99.3 F (37.4 C) (Rectal)   Resp 15   Ht 5\' 10"  (1.778 m)   Wt 96.5 kg   SpO2 98%   BMI 30.53 kg/m   Physical Exam Constitutional:      General: He is not in acute  distress.    Appearance: Normal appearance. He is well-developed. He is not ill-appearing or diaphoretic.  HENT:     Head: Normocephalic and atraumatic.  Eyes:     General: Vision grossly intact. Gaze aligned appropriately.     Pupils: Pupils are equal, round, and reactive to light.  Neck:     Trachea: Trachea and phonation normal.  Pulmonary:     Effort: Pulmonary effort is normal. No respiratory distress.  Abdominal:     General: There is no distension.     Palpations: Abdomen is soft.  Tenderness: There is no abdominal tenderness. There is no guarding or rebound.  Genitourinary:    Comments: Rectal examination chaperoned by Samuella Cota.  External examination shows nonthrombosed hemorrhoids without active bleeding.  Normal rectal tone.  Formed stool within the vault, no palpable hemorrhoids or induration.  Dark stool without frank blood. Musculoskeletal:        General: Normal range of motion.     Cervical back: Normal range of motion.     Comments: No midline C/T/L spinal tenderness to palpation, no paraspinal muscle tenderness, no deformity, crepitus, or step-off noted.  Skin:    General: Skin is warm and dry.  Neurological:     Mental Status: He is alert. Mental status is at baseline.     GCS: GCS eye subscore is 4. GCS verbal subscore is 5. GCS motor subscore is 6.     Comments: Answers basic questions with yes and no, dysarthric, follows commands appropriately.  Psychiatric:        Behavior: Behavior normal.    ED Results / Procedures / Treatments   Labs (all labs ordered are listed, but only abnormal results are displayed) Labs Reviewed  COMPREHENSIVE METABOLIC PANEL - Abnormal; Notable for the following components:      Result Value   Glucose, Bld 113 (*)    Albumin 3.4 (*)    All other components within normal limits  CBC WITH DIFFERENTIAL/PLATELET - Abnormal; Notable for the following components:   RBC 2.84 (*)    Hemoglobin 6.4 (*)    HCT 21.8 (*)    MCV 76.8  (*)    MCH 22.5 (*)    MCHC 29.4 (*)    RDW 24.0 (*)    Platelets 517 (*)    nRBC 0.3 (*)    All other components within normal limits  POC OCCULT BLOOD, ED - Abnormal; Notable for the following components:   Fecal Occult Bld POSITIVE (*)    All other components within normal limits  I-STAT CHEM 8, ED - Abnormal; Notable for the following components:   Glucose, Bld 118 (*)    Hemoglobin 9.5 (*)    HCT 28.0 (*)    All other components within normal limits  SARS CORONAVIRUS 2 BY RT PCR (HOSPITAL ORDER, [facility])  CBC WITH DIFFERENTIAL/PLATELET  CBC WITH DIFFERENTIAL/PLATELET  TYPE AND SCREEN  PREPARE RBC (CROSSMATCH)    EKG None  Radiology No results found.  Procedures .Critical Care Performed by: Deliah Boston, PA-C Authorized by: Deliah Boston, PA-C   Critical care provider statement:    Critical care time (minutes):  35   Critical care was necessary to treat or prevent imminent or life-threatening deterioration of the following conditions: Anemia.   Critical care was time spent personally by me on the following activities:  Discussions with consultants, evaluation of patient's response to treatment, examination of patient, ordering and performing treatments and interventions, ordering and review of laboratory studies, ordering and review of radiographic studies, pulse oximetry, re-evaluation of patient's condition, obtaining history from patient or surrogate, review of old charts and development of treatment plan with patient or surrogate   (including critical care time)  Medications Ordered in ED Medications  0.9 %  sodium chloride infusion (Manually program via Guardrails IV Fluids) (has no administration in time range)    ED Course  I have reviewed the triage vital signs and the nursing notes.  Pertinent labs & imaging results that were available during my care  of the patient were reviewed by me and considered in my medical  decision making (see chart for details).  Clinical Course as of Sep 28 2206  Tue Sep 28, 2019  [address] [place], [place] (Son)  [phone] [facility] Phone)   [BM]    Clinical Course User Index [BM] Gari Crown   MDM Rules/Calculators/A&P                          Additional History Obtained from: 1. Nursing notes from this visit. 2. Family, son at bedside. 3. Electronic medical record.  Shows patient had been admitted to the hospital on August 10, 879 following embolic stroke of the left mid MCA.  He had sudden onset aphasia, left-sided gaze and right hemiplegia.  He received TPA.  Discharged on aspirin 325 and Plavix for 3 months.  Additionally had hypertensive emergency, currently on metoprolol and amlodipine. ----------------- Patient no acute distress on arrival vital signs unremarkable, no tachycardia.  Patient and his son at bedside reports patient has been in normal state of health and had no concerns.  They were surprised to learn patient's hemoglobin was 6.4 at the nursing home.  Will obtain labs including CBC, CMP, type and screen, Hemoccult and Covid test.  Patient appears hemodynamically stable at this time. - I ordered, reviewed and interpreted labs which include: Hemoccult positive. CMP shows no emergent electrolyte derangement, AKI, LFT elevation or gap.  Initial CBC clotted, repeat was sent down.  In the meantime an i-STAT Chem-8 was obtained which showed hemoglobin of 9.5.  Will await CBC results prior to transfusion. - CBC shows hemoglobin 6.4.  Transfusion ordered.  Patient reassessed resting comfortably watching TV he is agreeable to blood transfusion.  Patient's son has left for the night.  I was able to contact patient's son on [phone], states understanding of care plan, risks vs benefits discussed and understood, agreeable to transfusion.  As patient and his son are both agreeable blood transfusion will transfuse here in the ED.  Plan of care is  hospitalist admission with GI consult.  Care handoff given to Arlean Hopping, PA-C at shift change who will follow up on consults and admit.  Case discussed with Dr. Laverta Baltimore during this visit.  Note: Portions of this report may have been transcribed using voice recognition software. Every effort was made to ensure accuracy; however, inadvertent computerized transcription errors may still be present. Final Clinical Impression(s) / ED Diagnoses Final diagnoses:  Anemia, unspecified type  Gastrointestinal hemorrhage, unspecified gastrointestinal hemorrhage type    Rx / DC Orders ED Discharge Orders    None        Gari Crown 09/28/19 2218    Margette Fast, MD 09/28/19 2332

## 2019-09-28 NOTE — ED Triage Notes (Signed)
Pt brought from [facility] for abnormal Hg 6.4, pt is no verbal at base line. SBP 148, HR 90, 98% RA.

## 2019-09-29 ENCOUNTER — Encounter (HOSPITAL_COMMUNITY): Payer: Self-pay | Admitting: Family Medicine

## 2019-09-29 DIAGNOSIS — D649 Anemia, unspecified: Secondary | ICD-10-CM | POA: Diagnosis not present

## 2019-09-29 DIAGNOSIS — N1831 Chronic kidney disease, stage 3a: Secondary | ICD-10-CM | POA: Diagnosis not present

## 2019-09-29 DIAGNOSIS — Z8673 Personal history of transient ischemic attack (TIA), and cerebral infarction without residual deficits: Secondary | ICD-10-CM

## 2019-09-29 DIAGNOSIS — E119 Type 2 diabetes mellitus without complications: Secondary | ICD-10-CM | POA: Diagnosis not present

## 2019-09-29 DIAGNOSIS — D509 Iron deficiency anemia, unspecified: Secondary | ICD-10-CM

## 2019-09-29 DIAGNOSIS — Z7902 Long term (current) use of antithrombotics/antiplatelets: Secondary | ICD-10-CM

## 2019-09-29 DIAGNOSIS — R195 Other fecal abnormalities: Secondary | ICD-10-CM | POA: Diagnosis not present

## 2019-09-29 DIAGNOSIS — I5032 Chronic diastolic (congestive) heart failure: Secondary | ICD-10-CM | POA: Diagnosis not present

## 2019-09-29 LAB — CBC WITH DIFFERENTIAL/PLATELET
Abs Immature Granulocytes: 0.03 10*3/uL (ref 0.00–0.07)
Basophils Absolute: 0.1 10*3/uL (ref 0.0–0.1)
Basophils Relative: 1 %
Eosinophils Absolute: 0.6 10*3/uL — ABNORMAL HIGH (ref 0.0–0.5)
Eosinophils Relative: 6 %
HCT: 25.5 % — ABNORMAL LOW (ref 39.0–52.0)
Hemoglobin: 7.5 g/dL — ABNORMAL LOW (ref 13.0–17.0)
Immature Granulocytes: 0 %
Lymphocytes Relative: 28 %
Lymphs Abs: 3.1 10*3/uL (ref 0.7–4.0)
MCH: 22.4 pg — ABNORMAL LOW (ref 26.0–34.0)
MCHC: 29.4 g/dL — ABNORMAL LOW (ref 30.0–36.0)
MCV: 76.1 fL — ABNORMAL LOW (ref 80.0–100.0)
Monocytes Absolute: 0.8 10*3/uL (ref 0.1–1.0)
Monocytes Relative: 7 %
Neutro Abs: 6.5 10*3/uL (ref 1.7–7.7)
Neutrophils Relative %: 58 %
Platelets: 442 10*3/uL — ABNORMAL HIGH (ref 150–400)
RBC: 3.35 MIL/uL — ABNORMAL LOW (ref 4.22–5.81)
RDW: 22.9 % — ABNORMAL HIGH (ref 11.5–15.5)
WBC: 11.1 10*3/uL — ABNORMAL HIGH (ref 4.0–10.5)
nRBC: 0.3 % — ABNORMAL HIGH (ref 0.0–0.2)

## 2019-09-29 LAB — BASIC METABOLIC PANEL
Anion gap: 8 (ref 5–15)
BUN: 10 mg/dL (ref 8–23)
CO2: 24 mmol/L (ref 22–32)
Calcium: 9.1 mg/dL (ref 8.9–10.3)
Chloride: 106 mmol/L (ref 98–111)
Creatinine, Ser: 1.05 mg/dL (ref 0.61–1.24)
GFR calc Af Amer: >60 mL/min (ref >60)
GFR calc non Af Amer: >60 mL/min (ref >60)
Glucose, Bld: 95 mg/dL (ref 70–99)
Potassium: 4.2 mmol/L (ref 3.5–5.1)
Sodium: 138 mmol/L (ref 135–145)

## 2019-09-29 LAB — TYPE AND SCREEN
ABO/RH(D): O POS
Antibody Screen: NEGATIVE
Unit division: 0

## 2019-09-29 LAB — BPAM RBC
Blood Product Expiration Date: 202108202359
ISSUE DATE / TIME: 202107202229
Unit Type and Rh: 5100

## 2019-09-29 LAB — ETHANOL: Alcohol, Ethyl (B): <10 mg/dL (ref <10)

## 2019-09-29 LAB — GLUCOSE, CAPILLARY: Glucose-Capillary: 155 mg/dL — ABNORMAL HIGH (ref 70–99)

## 2019-09-29 MED ORDER — ATORVASTATIN CALCIUM 80 MG PO TABS
80.0000 mg | ORAL_TABLET | Freq: Every day | ORAL | Status: DC
  Administered 2019-09-30 – 2019-10-07 (×8): 80 mg via ORAL
  Filled 2019-09-29 (×8): qty 1

## 2019-09-29 MED ORDER — LORAZEPAM 2 MG/ML IJ SOLN: 1.0000 mg | Freq: Once | INTRAMUSCULAR | Status: DC

## 2019-09-29 MED ORDER — LORAZEPAM 2 MG/ML IJ SOLN
1.0000 mg | Freq: Once | INTRAMUSCULAR | Status: AC
  Administered 2019-09-29: 1 mg via INTRAVENOUS
  Filled 2019-09-29: qty 1

## 2019-09-29 MED ORDER — LORAZEPAM 2 MG/ML IJ SOLN
1.0000 mg | Freq: Once | INTRAMUSCULAR | Status: AC
  Administered 2019-09-29: 1 mg via INTRAMUSCULAR

## 2019-09-29 MED ORDER — AMLODIPINE BESYLATE 10 MG PO TABS
10.0000 mg | ORAL_TABLET | Freq: Every day | ORAL | Status: DC
  Administered 2019-09-29 – 2019-10-07 (×9): 10 mg via ORAL
  Filled 2019-09-29 (×5): qty 1
  Filled 2019-09-29: qty 2
  Filled 2019-09-29 (×3): qty 1

## 2019-09-29 MED ORDER — THIAMINE HCL 100 MG PO TABS
100.0000 mg | ORAL_TABLET | Freq: Every day | ORAL | Status: DC
  Administered 2019-09-29 – 2019-10-07 (×9): 100 mg via ORAL
  Filled 2019-09-29 (×9): qty 1

## 2019-09-29 MED ORDER — FOLIC ACID 1 MG PO TABS
1.0000 mg | ORAL_TABLET | Freq: Every day | ORAL | Status: DC
  Administered 2019-09-29 – 2019-10-07 (×9): 1 mg via ORAL
  Filled 2019-09-29 (×9): qty 1

## 2019-09-29 MED ORDER — FERROUS SULFATE 300 (60 FE) MG/5ML PO SYRP
300.0000 mg | ORAL_SOLUTION | Freq: Three times a day (TID) | ORAL | Status: DC
  Filled 2019-09-29: qty 5

## 2019-09-29 MED ORDER — PANTOPRAZOLE SODIUM 40 MG PO TBEC
40.0000 mg | DELAYED_RELEASE_TABLET | Freq: Every day | ORAL | Status: DC
  Administered 2019-10-01 – 2019-10-07 (×7): 40 mg via ORAL
  Filled 2019-09-29 (×8): qty 1

## 2019-09-29 MED ORDER — ACETAMINOPHEN 325 MG PO TABS
650.0000 mg | ORAL_TABLET | Freq: Three times a day (TID) | ORAL | Status: DC
  Administered 2019-09-29 – 2019-10-07 (×22): 650 mg via ORAL
  Filled 2019-09-29 (×24): qty 2

## 2019-09-29 MED ORDER — METOPROLOL TARTRATE 25 MG PO TABS
25.0000 mg | ORAL_TABLET | Freq: Two times a day (BID) | ORAL | Status: DC
  Administered 2019-09-29 – 2019-10-07 (×16): 25 mg via ORAL
  Filled 2019-09-29 (×18): qty 1

## 2019-09-29 MED ORDER — INSULIN ASPART 100 UNIT/ML ~~LOC~~ SOLN
0.0000 [IU] | Freq: Three times a day (TID) | SUBCUTANEOUS | Status: DC
  Administered 2019-10-01 (×2): 2 [IU] via SUBCUTANEOUS
  Administered 2019-10-02 – 2019-10-04 (×5): 1 [IU] via SUBCUTANEOUS
  Administered 2019-10-05 (×2): 2 [IU] via SUBCUTANEOUS
  Administered 2019-10-05 – 2019-10-06 (×2): 1 [IU] via SUBCUTANEOUS
  Administered 2019-10-06: 2 [IU] via SUBCUTANEOUS
  Administered 2019-10-07: 1 [IU] via SUBCUTANEOUS

## 2019-09-29 MED ORDER — HYDRALAZINE HCL 20 MG/ML IJ SOLN
10.0000 mg | Freq: Three times a day (TID) | INTRAMUSCULAR | Status: DC | PRN
  Administered 2019-09-29 – 2019-09-30 (×2): 10 mg via INTRAVENOUS
  Filled 2019-09-29 (×3): qty 1

## 2019-09-29 MED ORDER — SODIUM CHLORIDE 0.9 % IV SOLN
510.0000 mg | Freq: Once | INTRAVENOUS | Status: AC
  Administered 2019-09-30: 510 mg via INTRAVENOUS
  Filled 2019-09-29: qty 17

## 2019-09-29 NOTE — Progress Notes (Signed)
PROGRESS NOTE  Malik [name] ZOX:[id number] DOB: [birth year] DOA: 09/28/2019 PCP: [facility] [place], NP  HPI/Recap of past 24 hours: HPI from Dr Malik [name] is a 74 y.o. male with medical history significant for TBI with aphasia, recent CVA with right upper extremity hemiplegia on dual antiplatelet therapy, history of iron deficiency anemia, type 2 diabetes, CKD stage IIIa, hypertension, OSA, hyperlipidemia and alcohol abuse who presents with concerns of acute anemia. Patient received lab work at his nursing facility and was noted to have hemoglobin of 6.4 with baseline hemoglobin of 7-8. Of note, pt was started on Plavix and aspirin in June due to a new left MCA infarct and also started on oral iron supplement for iron deficiency anemia.  Patient found to be Hemoccult positive during this admission.  He follows with Dr. Clide Deutscher at [facility] [place].  Reportedly had heme positive stool back in 2019 without any intervention.  Unsure of last colonoscopy. In the ED, he was afebrile and hypertensive with systolic up to 811B.  Initially his hemoglobin came back around 9 but on repeat it was 6.4 and patient and his son agreed to have 1 unit of PRBC transfusion. Creatinine and BUN normal.   Patient admitted for further management.     Today, patient was met resting in bed, somewhat restless.  Patient was holding his condom cath in his hand, noted to have pulled it out.  Unable to perform ROS on patient due to aphasia.     Assessment/Plan: Principal Problem:   Acute anemia Active Problems:   Essential hypertension   Chronic diastolic congestive heart failure (HCC)   Diabetes mellitus type 2 in nonobese (HCC)   CKD (chronic kidney disease), stage IIIa   Alcohol abuse   History of CVA (cerebrovascular accident)   ??Possible GI bleed Acute on chronic anemia Hemoglobin 6.4 on admission, baseline around 7-8 FOBT positive CT abdomen/pelvis pending S/p transfusion of  1 unit of PRBC Give IV Feraheme, continue oral iron supplementation when able On Plavix, aspirin PTA, currently held Continue PPI GI consulted, appreciate recs (holding off any procedure for now due to patient being high risk) Daily CBC  History of CVA/TBI/HLD Noted to be aphasic at baseline, with right upper extremity hemiplegia Recent stroke in June/2021 Hold aspirin and Plavix for now due to anemia Continue Lipitor when able Telemetry  Chronic diastolic HF/pulmonary hypertension Does not appear overloaded Echo done on 08/2019 showed EF of 50 to 55%, no regional wall motion abnormality, grade 2 diastolic dysfunction Monitor closely   CKD stage IIIa Creatinine stable Daily BMP  Hypertension Uncontrolled Start home amlodipine, metoprolol IV hydralazine as needed  Diabetes mellitus type 2 Last A1c 5.4 SSI, Accu-Cheks, hypoglycemic protocol Hold home Metformin  Thrombocytosis Possibly due to anemia Daily CBC  Alcohol abuse Alcohol level less than 10 CIWA protocol  Severe dysphagia Seen by SLP in 08/2019, recommend dysphagia 1 diet and nectar thick liquid SLP consulted to reevaluate       Malnutrition Type:      Malnutrition Characteristics:      Nutrition Interventions:       Estimated body mass index is 30.53 kg/m as calculated from the following:   Height as of this encounter: _0  (1.778 m).   Weight as of this encounter: 96.5 kg.     Code Status: Full  Family Communication: None at bedside  Disposition Plan: Status is: Observation  The patient remains OBS appropriate and will d/c before 2 midnights.  Dispo: The patient is from: SNF              Anticipated d/c is to: SNF              Anticipated d/c date is: 3 days              Patient currently is not medically stable to d/c.     Consultants:  GI  Procedures:  None  Antimicrobials:  None  DVT prophylaxis: SCDs   Objective: Vitals:   09/29/19 1206 09/29/19 1300  09/29/19 1401 09/29/19 1501  BP: (!) 187/86 (!) 185/85 (!) 170/76 (!) 173/79  Pulse: 66 (!) 56 60 63  Resp:  _0 Temp:      TempSrc:      SpO2:  99% 95% 97%  Weight:      Height:        Intake/Output Summary (Last 24 hours) at 09/29/2019 1737 Last data filed at 09/29/2019 3149 Gross per 24 hour  Intake 630 ml  Output 750 ml  Net -120 ml   Filed Weights   09/28/19 1904  Weight: 96.5 kg    Exam:  General: NAD, aphasic  Cardiovascular: S1, S2 present  Respiratory: CTAB  Abdomen: Soft, nontender, nondistended, bowel sounds present  Musculoskeletal: No bilateral pedal edema noted  Skin: Normal  Psychiatry:  Unable to assess  Neurology: Aphasic, able to follow commands, unable to lift right upper extremity/residual hemiplegia from previous stroke    Data Reviewed: CBC: Recent Labs  Lab 09/28/19 2115 09/28/19 2139 09/29/19 0602  WBC 9.9  --  11.1*  NEUTROABS 5.2  --  6.5  HGB 6.4* 9.5* 7.5*  HCT 21.8* 28.0* 25.5*  MCV 76.8*  --  76.1*  PLT 517*  --  702*   Basic Metabolic Panel: Recent Labs  Lab 09/28/19 1906 09/28/19 2139 09/29/19 0602  NA 139 140 138  K 4.3 3.9 4.2  CL 107 104 106  CO2 23  --  24  GLUCOSE 113* 118* 95  BUN _1 CREATININE 1.01 1.00 1.05  CALCIUM 9.2  --  9.1   GFR: Estimated Creatinine Clearance: 70.8 mL/min (by C-G formula based on SCr of 1.05 mg/dL). Liver Function Tests: Recent Labs  Lab 09/28/19 1906  AST 26  ALT 17  ALKPHOS 79  BILITOT 0.4  PROT 7.7  ALBUMIN 3.4*   No results for input(s): LIPASE, AMYLASE in the last 168 hours. No results for input(s): AMMONIA in the last 168 hours. Coagulation Profile: No results for input(s): INR, PROTIME in the last 168 hours. Cardiac Enzymes: No results for input(s): CKTOTAL, CKMB, CKMBINDEX, TROPONINI in the last 168 hours. BNP (last 3 results) No results for input(s): PROBNP in the last 8760 hours. HbA1C: No results for input(s): HGBA1C in the last 72  hours. CBG: No results for input(s): GLUCAP in the last 168 hours. Lipid Profile: No results for input(s): CHOL, HDL, LDLCALC, TRIG, CHOLHDL, LDLDIRECT in the last 72 hours. Thyroid Function Tests: No results for input(s): TSH, T4TOTAL, FREET4, T3FREE, THYROIDAB in the last 72 hours. Anemia Panel: No results for input(s): VITAMINB12, FOLATE, FERRITIN, TIBC, IRON, RETICCTPCT in the last 72 hours. Urine analysis:    Component Value Date/Time   COLORURINE STRAW (A) 08/11/2019 [place] 08/11/2019 0343   APPEARANCEUR Clear 06/11/2011 2046   LABSPEC 1.023 08/11/2019 0343   LABSPEC 1.025 06/11/2011 2046   PHURINE 6.0 08/11/2019 0343   GLUCOSEU >=500 (A) 08/11/2019  [place] Negative 06/11/2011 2046   HGBUR SMALL (A) 08/11/2019 0343   BILIRUBINUR NEGATIVE 08/11/2019 0343   BILIRUBINUR Negative 06/11/2011 2046   KETONESUR NEGATIVE 08/11/2019 0343   PROTEINUR 30 (A) 08/11/2019 0343   NITRITE NEGATIVE 08/11/2019 0343   LEUKOCYTESUR LARGE (A) 08/11/2019 0343   LEUKOCYTESUR Negative 06/11/2011 2046   Sepsis Labs: _0 (procalcitonin:4,lacticidven:4)  ) Recent Results (from the past 240 hour(s))  SARS Coronavirus 2 by RT PCR (hospital order, performed in [facility] hospital lab) Nasopharyngeal Nasopharyngeal Swab     Status: None   Collection Time: 09/28/19  8:51 PM   Specimen: Nasopharyngeal Swab  Result Value Ref Range Status   SARS Coronavirus 2 NEGATIVE NEGATIVE Final    Comment: (NOTE) SARS-CoV-2 target nucleic acids are NOT DETECTED.  The SARS-CoV-2 RNA is generally detectable in upper and lower respiratory specimens during the acute phase of infection. The lowest concentration of SARS-CoV-2 viral copies this assay can detect is 250 copies / mL. A negative result does not preclude SARS-CoV-2 infection and should not be used as the sole basis for treatment or other patient management decisions.  A negative result may occur with improper specimen  collection / handling, submission of specimen other than nasopharyngeal swab, presence of viral mutation(s) within the areas targeted by this assay, and inadequate number of viral copies (<250 copies / mL). A negative result must be combined with clinical observations, patient history, and epidemiological information.  Fact Sheet for Patients:   StrictlyIdeas.no  Fact Sheet for Healthcare Providers: BankingDealers.co.za  This test is not yet approved or  cleared by the [place] FDA and has been authorized for detection and/or diagnosis of SARS-CoV-2 by FDA under an Emergency Use Authorization (EUA).  This EUA will remain in effect (meaning this test can be used) for the duration of the COVID-19 declaration under Section 564(b)(1) of the Act, 21 U.S.C. section 360bbb-3(b)(1), unless the authorization is terminated or revoked sooner.  Performed at [facility], [place] [address] [place], [place] [address]       Studies: No results found.  Scheduled Meds: . acetaminophen  650 mg Oral Q8H  . amLODipine  10 mg Oral Daily  . folic acid  1 mg Oral Daily  . metoprolol tartrate  25 mg Oral BID  . [START ON 09/30/2019] pantoprazole  40 mg Oral Q0600  . thiamine  100 mg Oral Daily    Continuous Infusions:   LOS: 0 days     Alma Friendly, MD [facility]  If 7PM-7AM, please contact night-coverage [email or web address] 09/29/2019, 5:37 PM

## 2019-09-29 NOTE — ED Notes (Signed)
Patient's BP is 193/84, he becomes combative when I try to give him the PRN hydralizine.Attempted to redirect and calm him down but as soon as I reach for his IV he becomes aggressive. MD contacted for further orders.

## 2019-09-29 NOTE — ED Notes (Signed)
Pt was laying in urine due to condom cath coming off. Pt was cleaned up and a brief was applied with new linen and gown.

## 2019-09-29 NOTE — ED Notes (Signed)
Attempted to give hydralazine IV to see if pt has calmed down enough after ativan. Pt pulled away, swinging his arm. Unable to give and unlikely to be able to get a CT at this time.

## 2019-09-29 NOTE — ED Notes (Signed)
Patient is resting comfortably. Pt was told to call out if he needed anything.

## 2019-09-29 NOTE — ED Notes (Signed)
Cleaned up pt and changed brief

## 2019-09-29 NOTE — ED Notes (Signed)
Pt urinary incontinent in bed. Pt cleaned and changed. Condom catheter applied with Remo Lipps, RN at bedside.

## 2019-09-29 NOTE — ED Notes (Signed)
Pharmacy messaged regarding dose of Protonix

## 2019-09-29 NOTE — ED Notes (Signed)
This RN went to administer scheduled protonix & assessed pt need for tylenol. Pt expressed pain of 5/10, 650mg  of tylenol given
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
## 2019-09-29 NOTE — ED Notes (Signed)
It took 3 staff members to help hold pt in order to give IV ativan and IV hydralazine. Pt still attempting to get up after ativan. CT asked to take him but this RN said he will likely not be cooperative.

## 2019-09-30 ENCOUNTER — Observation Stay (HOSPITAL_COMMUNITY): Payer: Medicare Other

## 2019-09-30 DIAGNOSIS — D649 Anemia, unspecified: Secondary | ICD-10-CM | POA: Diagnosis not present

## 2019-09-30 DIAGNOSIS — R195 Other fecal abnormalities: Secondary | ICD-10-CM | POA: Diagnosis not present

## 2019-09-30 DIAGNOSIS — E119 Type 2 diabetes mellitus without complications: Secondary | ICD-10-CM | POA: Diagnosis not present

## 2019-09-30 DIAGNOSIS — F1010 Alcohol abuse, uncomplicated: Secondary | ICD-10-CM | POA: Diagnosis not present

## 2019-09-30 DIAGNOSIS — I5032 Chronic diastolic (congestive) heart failure: Secondary | ICD-10-CM | POA: Diagnosis not present

## 2019-09-30 DIAGNOSIS — Z7902 Long term (current) use of antithrombotics/antiplatelets: Secondary | ICD-10-CM | POA: Diagnosis not present

## 2019-09-30 LAB — GLUCOSE, CAPILLARY
Glucose-Capillary: 133 mg/dL — ABNORMAL HIGH (ref 70–99)
Glucose-Capillary: 115 mg/dL — ABNORMAL HIGH (ref 70–99)
Glucose-Capillary: 97 mg/dL (ref 70–99)
Glucose-Capillary: 109 mg/dL — ABNORMAL HIGH (ref 70–99)

## 2019-09-30 LAB — CBC WITH DIFFERENTIAL/PLATELET
Abs Immature Granulocytes: 0.04 10*3/uL (ref 0.00–0.07)
Basophils Absolute: 0.1 10*3/uL (ref 0.0–0.1)
Basophils Relative: 1 %
Eosinophils Absolute: 0.2 10*3/uL (ref 0.0–0.5)
Eosinophils Relative: 2 %
HCT: 25.4 % — ABNORMAL LOW (ref 39.0–52.0)
Hemoglobin: 7.9 g/dL — ABNORMAL LOW (ref 13.0–17.0)
Immature Granulocytes: 0 %
Lymphocytes Relative: 19 %
Lymphs Abs: 2.5 10*3/uL (ref 0.7–4.0)
MCH: 23.3 pg — ABNORMAL LOW (ref 26.0–34.0)
MCHC: 31.1 g/dL (ref 30.0–36.0)
MCV: 74.9 fL — ABNORMAL LOW (ref 80.0–100.0)
Monocytes Absolute: 1.3 10*3/uL — ABNORMAL HIGH (ref 0.1–1.0)
Monocytes Relative: 9 %
Neutro Abs: 9.2 10*3/uL — ABNORMAL HIGH (ref 1.7–7.7)
Neutrophils Relative %: 69 %
Platelets: 568 10*3/uL — ABNORMAL HIGH (ref 150–400)
RBC: 3.39 MIL/uL — ABNORMAL LOW (ref 4.22–5.81)
RDW: 22.6 % — ABNORMAL HIGH (ref 11.5–15.5)
WBC: 13.3 10*3/uL — ABNORMAL HIGH (ref 4.0–10.5)
nRBC: 0.2 % (ref 0.0–0.2)

## 2019-09-30 LAB — URINALYSIS, ROUTINE W REFLEX MICROSCOPIC
Bilirubin Urine: NEGATIVE
Glucose, UA: NEGATIVE mg/dL
Hgb urine dipstick: NEGATIVE
Ketones, ur: NEGATIVE mg/dL
Leukocytes,Ua: NEGATIVE
Nitrite: NEGATIVE
Protein, ur: 30 mg/dL — AB
Specific Gravity, Urine: 1.014 (ref 1.005–1.030)
pH: 7 (ref 5.0–8.0)

## 2019-09-30 LAB — BASIC METABOLIC PANEL
Anion gap: 10 (ref 5–15)
BUN: 12 mg/dL (ref 8–23)
CO2: 22 mmol/L (ref 22–32)
Calcium: 9.6 mg/dL (ref 8.9–10.3)
Chloride: 105 mmol/L (ref 98–111)
Creatinine, Ser: 0.92 mg/dL (ref 0.61–1.24)
GFR calc Af Amer: >60 mL/min (ref >60)
GFR calc non Af Amer: >60 mL/min (ref >60)
Glucose, Bld: 126 mg/dL — ABNORMAL HIGH (ref 70–99)
Potassium: 4 mmol/L (ref 3.5–5.1)
Sodium: 137 mmol/L (ref 135–145)

## 2019-09-30 MED ORDER — IOHEXOL 300 MG/ML  SOLN
100.0000 mL | Freq: Once | INTRAMUSCULAR | Status: AC | PRN
  Administered 2019-09-30: 100 mL via INTRAVENOUS

## 2019-09-30 NOTE — Progress Notes (Signed)
PT Cancellation Note  Patient Details Name: Malik [name] MRN: [id number] DOB: December 10, 1945   Cancelled Treatment:    Reason Eval/Treat Not Completed: Medical issues which prohibited therapy.  BP is elevated and pt is quite agitated.  Reattempt at another time.   Ramond Dial 09/30/2019, 7:23 AM  Mee Hives, PT MS Acute Rehab Dept. Number: [place] [facility] [place]

## 2019-09-30 NOTE — Progress Notes (Addendum)
OT Cancellation Note  Patient Details Name: Malik [name] MRN: [id number] DOB: January 01, 1946   Cancelled Treatment:    Reason Eval/Treat Not Completed: Medical issues which prohibited therapy. Per chart review patient with elevated BP, is very agitated and refusing all treatment. Will continue efforts toward completion of evaluation when patient is able to participate.   Addendum: Patient with continued elevated BP of 185/66mmHg. OT will continue efforts at a later date.   [name] OTR/L Supplemental OT, Department of rehab services [phone]  Taryll Reichenberger R H.  09/30/2019, 7:48 AM

## 2019-09-30 NOTE — Progress Notes (Signed)
Pt repositioned in bed. Bed alarm is on and bed in low position. Floor mat placed.

## 2019-09-30 NOTE — Progress Notes (Addendum)
Pt very agitated upon arrival to 3E: CIWA score: 8 (0000) - 1mg  Ativan CIWA score: 10 (0100) - 1mg  Ativan CIWA score: 3 (0200)   Morning VS and CBG attempted by this RN and nurse tech. Unable to hold pt down to obtain. Pt combative and refuses all treatment.

## 2019-09-30 NOTE — Progress Notes (Addendum)
PROGRESS NOTE  Malik [name] QQP:[id number] DOB: 01/24/1946 DOA: 09/28/2019 PCP: [facility] [place], NP  HPI/Recap of past 24 hours: HPI from Dr Simon Rhein is a 74 y.o. male with medical history significant for TBI with aphasia, recent CVA with right upper extremity hemiplegia on dual antiplatelet therapy, history of iron deficiency anemia, type 2 diabetes, CKD stage IIIa, hypertension, OSA, hyperlipidemia and alcohol abuse who presents with concerns of acute anemia. Patient received lab work at his nursing facility and was noted to have hemoglobin of 6.4 with baseline hemoglobin of 7-8. Of note, pt was started on Plavix and aspirin in June due to a new left MCA infarct and also started on oral iron supplement for iron deficiency anemia.  Patient found to be Hemoccult positive during this admission.  He follows with Dr. Clide Deutscher at [facility] [place].  Reportedly had heme positive stool back in 2019 without any intervention.  Unsure of last colonoscopy. In the ED, he was afebrile and hypertensive with systolic up to 712W.  Initially his hemoglobin came back around 9 but on repeat it was 6.4 and patient and his son agreed to have 1 unit of PRBC transfusion. Creatinine and BUN normal.   Patient admitted for further management.     Overnight, patient continued to be restless, combative, refusing care, refusing medications, including IV.  BP still uncontrolled, unable to administer meds.  This a.m., patient was sleeping, easily arousable.  Due to aphasia unable to perform ROS.     Assessment/Plan: Principal Problem:   Acute anemia Active Problems:   Essential hypertension   Chronic diastolic congestive heart failure (HCC)   Diabetes mellitus type 2 in nonobese (HCC)   CKD (chronic kidney disease), stage IIIa   Alcohol abuse   History of CVA (cerebrovascular accident)   ??Possible GI bleed Acute on chronic anemia Hemoglobin 6.4 on admission, baseline around  7-8 FOBT positive CT abdomen/pelvis pending S/p transfusion of 1 unit of PRBC S/p IV Feraheme, continue oral iron supplementation when able On Plavix, aspirin PTA, currently held Continue PPI GI consulted, appreciate recs (holding off any procedure for now due to patient being high risk) Daily CBC  Acute metabolic encephalopathy Unknown etiology Afebrile with leukocytosis (noted to be chronic) Urine culture pending Chest x-ray showed decreased bibasilar opacities suggesting improving atelectasis or pneumonia CT head without contrast pending Fall precautions  History of CVA/TBI/HLD Noted to be aphasic at baseline, with right upper extremity hemiplegia Recent stroke in June/2021 Hold aspirin and Plavix for now due to anemia Continue Lipitor when able Telemetry  Chronic diastolic HF/pulmonary hypertension Does not appear overloaded Echo done on 08/2019 showed EF of 50 to 55%, no regional wall motion abnormality, grade 2 diastolic dysfunction Monitor closely   CKD stage IIIa Creatinine stable Daily BMP  Hypertension Uncontrolled Start home amlodipine, metoprolol IV hydralazine as needed  Diabetes mellitus type 2 Last A1c 5.4 SSI, Accu-Cheks, hypoglycemic protocol Hold home Metformin  Thrombocytosis Possibly due to anemia Daily CBC  Alcohol abuse Alcohol level less than 10 CIWA protocol  Severe dysphagia Seen by SLP in 08/2019, recommend dysphagia 1 diet and nectar thick liquid SLP consulted to reevaluate       Malnutrition Type:      Malnutrition Characteristics:      Nutrition Interventions:       Estimated body mass index is 29.27 kg/m as calculated from the following:   Height as of this encounter: 5\' 10"  (1.778 m).   Weight as of  this encounter: 92.5 kg.     Code Status: Full  Family Communication: None at bedside  Disposition Plan: Status is: Observation  The patient remains OBS appropriate and will d/c before 2  midnights.  Dispo: The patient is from: SNF              Anticipated d/c is to: SNF              Anticipated d/c date is: 3 days              Patient currently is not medically stable to d/c.     Consultants:  GI  Procedures:  None  Antimicrobials:  None  DVT prophylaxis: SCDs   Objective: Vitals:   09/29/19 2357 09/30/19 0019 09/30/19 0803 09/30/19 1100  BP: (!) 191/98  (!) 170/72 (!) 185/86  Pulse: (!) 101  80 83  Resp: 18  15   Temp: 97.9 F (36.6 C)  98.5 F (36.9 C)   TempSrc: Oral  Oral   SpO2: 98%  100%   Weight:  92.5 kg    Height:  5\' 10"  (1.778 m)      Intake/Output Summary (Last 24 hours) at 09/30/2019 1701 Last data filed at 09/30/2019 4627 Gross per 24 hour  Intake 0 ml  Output 200 ml  Net -200 ml   Filed Weights   09/28/19 1904 09/30/19 0019  Weight: 96.5 kg 92.5 kg    Exam:  General: NAD, aphasic, sleepy  Cardiovascular: S1, S2 present  Respiratory: CTAB  Abdomen: Soft, nontender, nondistended, bowel sounds present  Musculoskeletal: No bilateral pedal edema noted  Skin: Normal  Psychiatry:  Unable to assess  Neurology: Unable to assess    Data Reviewed: CBC: Recent Labs  Lab 09/28/19 2115 09/28/19 2139 09/29/19 0602 09/30/19 0518  WBC 9.9  --  11.1* 13.3*  NEUTROABS 5.2  --  6.5 9.2*  HGB 6.4* 9.5* 7.5* 7.9*  HCT 21.8* 28.0* 25.5* 25.4*  MCV 76.8*  --  76.1* 74.9*  PLT 517*  --  442* 035*   Basic Metabolic Panel: Recent Labs  Lab 09/28/19 1906 09/28/19 2139 09/29/19 0602 09/30/19 0518  NA 139 140 138 137  K 4.3 3.9 4.2 4.0  CL 107 104 106 105  CO2 23  --  24 22  GLUCOSE 113* 118* 95 126*  BUN 13 14 10 12   CREATININE 1.01 1.00 1.05 0.92  CALCIUM 9.2  --  9.1 9.6   GFR: Estimated Creatinine Clearance: 79.3 mL/min (by C-G formula based on SCr of 0.92 mg/dL). Liver Function Tests: Recent Labs  Lab 09/28/19 1906  AST 26  ALT 17  ALKPHOS 79  BILITOT 0.4  PROT 7.7  ALBUMIN 3.4*   No results for  input(s): LIPASE, AMYLASE in the last 168 hours. No results for input(s): AMMONIA in the last 168 hours. Coagulation Profile: No results for input(s): INR, PROTIME in the last 168 hours. Cardiac Enzymes: No results for input(s): CKTOTAL, CKMB, CKMBINDEX, TROPONINI in the last 168 hours. BNP (last 3 results) No results for input(s): PROBNP in the last 8760 hours. HbA1C: No results for input(s): HGBA1C in the last 72 hours. CBG: Recent Labs  Lab 09/29/19 2327 09/30/19 0808 09/30/19 1108  GLUCAP 155* 133* 115*   Lipid Profile: No results for input(s): CHOL, HDL, LDLCALC, TRIG, CHOLHDL, LDLDIRECT in the last 72 hours. Thyroid Function Tests: No results for input(s): TSH, T4TOTAL, FREET4, T3FREE, THYROIDAB in the last 72 hours. Anemia Panel: No results for  input(s): VITAMINB12, FOLATE, FERRITIN, TIBC, IRON, RETICCTPCT in the last 72 hours. Urine analysis:    Component Value Date/Time   COLORURINE YELLOW 09/30/2019 1005   APPEARANCEUR CLEAR 09/30/2019 1005   APPEARANCEUR Clear 06/11/2011 2046   LABSPEC 1.014 09/30/2019 1005   LABSPEC 1.025 06/11/2011 2046   PHURINE 7.0 09/30/2019 1005   GLUCOSEU NEGATIVE 09/30/2019 1005   GLUCOSEU Negative 06/11/2011 2046   HGBUR NEGATIVE 09/30/2019 1005   BILIRUBINUR NEGATIVE 09/30/2019 1005   BILIRUBINUR Negative 06/11/2011 2046   KETONESUR NEGATIVE 09/30/2019 1005   PROTEINUR 30 (A) 09/30/2019 1005   NITRITE NEGATIVE 09/30/2019 1005   LEUKOCYTESUR NEGATIVE 09/30/2019 1005   LEUKOCYTESUR Negative 06/11/2011 2046   Sepsis Labs: @LABRCNTIP (procalcitonin:4,lacticidven:4)  ) Recent Results (from the past 240 hour(s))  SARS Coronavirus 2 by RT PCR (hospital order, performed in [place] hospital lab) Nasopharyngeal Nasopharyngeal Swab     Status: None   Collection Time: 09/28/19  8:51 PM   Specimen: Nasopharyngeal Swab  Result Value Ref Range Status   SARS Coronavirus 2 NEGATIVE NEGATIVE Final    Comment: (NOTE) SARS-CoV-2 target  nucleic acids are NOT DETECTED.  The SARS-CoV-2 RNA is generally detectable in upper and lower respiratory specimens during the acute phase of infection. The lowest concentration of SARS-CoV-2 viral copies this assay can detect is 250 copies / mL. A negative result does not preclude SARS-CoV-2 infection and should not be used as the sole basis for treatment or other patient management decisions.  A negative result may occur with improper specimen collection / handling, submission of specimen other than nasopharyngeal swab, presence of viral mutation(s) within the areas targeted by this assay, and inadequate number of viral copies (<250 copies / mL). A negative result must be combined with clinical observations, patient history, and epidemiological information.  Fact Sheet for Patients:   StrictlyIdeas.no  Fact Sheet for Healthcare Providers: BankingDealers.co.za  This test is not yet approved or  cleared by the [place] FDA and has been authorized for detection and/or diagnosis of SARS-CoV-2 by FDA under an Emergency Use Authorization (EUA).  This EUA will remain in effect (meaning this test can be used) for the duration of the COVID-19 declaration under Section 564(b)(1) of the Act, 21 U.S.C. section 360bbb-3(b)(1), unless the authorization is terminated or revoked sooner.  Performed at [facility], [place] [address] [place], [place] [address]       Studies: DG Chest Port 1 View  Result Date: 09/30/2019 CLINICAL DATA:  Acute encephalopathy. EXAM: PORTABLE CHEST 1 VIEW COMPARISON:  August 17, 2019. FINDINGS: Stable cardiomegaly. No pneumothorax or pleural effusion is noted. Decreased bibasilar opacities are noted suggesting improving atelectasis or pneumonia. Old left rib fractures are noted. IMPRESSION: Decreased bibasilar opacities are noted suggesting improving atelectasis or pneumonia. Electronically Signed   By: Marijo Conception M.D.   On: 09/30/2019 08:12    Scheduled Meds: . acetaminophen  650 mg Oral Q8H  . amLODipine  10 mg Oral Daily  . atorvastatin  80 mg Oral Daily  . folic acid  1 mg Oral Daily  . insulin aspart  0-9 Units Subcutaneous TID WC  . metoprolol tartrate  25 mg Oral BID  . pantoprazole  40 mg Oral Q0600  . thiamine  100 mg Oral Daily    Continuous Infusions:   LOS: 0 days     Alma Friendly, MD [facility]  If 7PM-7AM, please contact night-coverage [email or web address] 09/30/2019, 5:01 PM

## 2019-09-30 NOTE — Progress Notes (Signed)
RN attempted to reassess pt vital signs. Pt combative with nurse and tech. Unable to recheck vitals at this time.

## 2019-09-30 NOTE — Evaluation (Signed)
Clinical/Bedside Swallow Evaluation Patient Details  Name: Malik [name] MRN: [id number] Date of Birth: Jan 09, 1946  Today's Date: 09/30/2019 Time: SLP Start Time (ACUTE ONLY): 7619 SLP Stop Time (ACUTE ONLY): 0842 SLP Time Calculation (min) (ACUTE ONLY): 13 min  Past Medical History:  Past Medical History:  Diagnosis Date  . Angina of effort ([place])   . Chronic diastolic CHF (congestive heart failure) ([place])   . CVA (cerebral vascular accident) ([place]) 08/10/2019   right sided weakness  . Diabetes ([place])   . Diabetes mellitus without complication ([place])   . Dilated cardiomyopathy ([place])   . Heart disease   . High blood pressure   . Hypertension   . 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 Moderate mitral insufficiency   . Neuropathy    with LE weakness  . Sleep apnea   . TBI (traumatic brain injury) [facility])    Past Surgical History:  Past Surgical History:  Procedure Laterality Date  . BACK SURGERY    . IR CT HEAD LTD  08/10/2019  . IR PERCUTANEOUS ART THROMBECTOMY/INFUSION INTRACRANIAL INC DIAG ANGIO  08/10/2019  . RADIOLOGY WITH ANESTHESIA N/A 08/10/2019   Procedure: IR WITH ANESTHESIA -CODE STROKE;  Surgeon: Radiologist, Medication, MD;  Location: [facility];  Service: Radiology;  Laterality: N/A;  . 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 male with medical history significant for TBI with aphasia, recent CVA with right upper extremity hemiplegia on dual antiplatelet therapy, history of iron deficiency anemia, type 2 diabetes, CKD stage IIIa, hypertension, OSA, hyperlipidemia and alcohol abuse who presented with concerns of acute anemia. MBS 08/17/19: moderate oral and more mild pharyngeal dysphagia. He had more oral holding than was observed at bedside. Significant amounts of R-sided anterior spillage was noted  with liquids via spoon and cup. There is also posterior spillage into the pharynx, reaching the valleculae primarily but at times the pyriform sinuses. Anterior and posterior spillage is reduced with nectar thick liquids. Thin liquids by cup are aspirated before the swallow with a spontaneous cough reflex triggered. Most of the thin liquids from a spoon were lost anteriorly. Pt had improved airway protection before and during the swallow with nectar thick liquids, with penetration x1 after the swallow with oral residue from a cup sip, although question if he could have also aspirated some secretions. An NPO status was recommended at that time but he was transitioned to a puree diet with nectar thick liquids following diagnostic treatment on that date.    Assessment / Plan / Recommendation Clinical Impression  Pt was seen for bedside swallow evaluation. He has a history of aphasia and did not communicate verbally or consistently follow commands. Oral mechanism exam was therefore limited, but he did demonstrate right-sided facial weakness and adequate dentition was noted. Pt presents with oropharyngeal dysphagia similar to that which was demonstrated when he was last seen by speech pathology in June. He exhibited lingual residue, residue in the right lateral sulcus, right-sided anterior spillage of liquids, reduced labial stripping with solids, and signs of aspiration with thin liquids. It is recommended that the pt's current diet of dysphagia 1 solids and nectar thick liquids be continued and SLP will follow to ensure safety.  SLP Visit Diagnosis: Dysphagia, oropharyngeal phase (R13.12)    Aspiration Risk  Mild aspiration risk  Diet Recommendation Dysphagia 1 (Puree);Nectar-thick liquid   Liquid Administration via: Cup;No straw Medication Administration: Crushed with puree Supervision: Staff to assist with self feeding;Full supervision/cueing for compensatory strategies Compensations: Slow rate;Small  sips/bites;Follow solids with liquid;Monitor for anterior loss Postural Changes: Seated upright at 90 degrees;Remain upright for at least 30 minutes after po intake    Other  Recommendations Oral Care Recommendations: Oral care BID   Follow up Recommendations None      Frequency and Duration min 2x/week  2 weeks       Prognosis Prognosis for Safe Diet Advancement: Fair Barriers to Reach Goals: Time post onset (language impairment)      Swallow Study   General Date of Onset: 08/17/19 HPI: Pt is a 74 y.o. male with medical history significant for TBI with aphasia, recent CVA with right upper extremity hemiplegia on dual antiplatelet therapy, history of iron deficiency anemia, type 2 diabetes, CKD stage IIIa, hypertension, OSA, hyperlipidemia and alcohol abuse who presented with concerns of acute anemia. MBS 08/17/19: moderate oral and more mild pharyngeal dysphagia. He had more oral holding than was observed at bedside. Significant amounts of R-sided anterior spillage was noted with liquids via spoon and cup. There is also posterior spillage into the pharynx, reaching the valleculae primarily but at times the pyriform sinuses. Anterior and posterior spillage is reduced with nectar thick liquids. Thin liquids by cup are aspirated before the swallow with a spontaneous cough reflex triggered. Most of the thin liquids from a spoon were lost anteriorly. Pt had improved airway protection before and during the swallow with nectar thick liquids, with penetration x1 after the swallow with oral residue from a cup sip, although question if he could have also aspirated some secretions. An NPO status was recommended at that time but he was transitioned to a puree diet with nectar thick liquids following diagnostic treatment on that date.  Type of Study: Bedside Swallow Evaluation Previous Swallow Assessment: See HPI Diet Prior to this Study: Dysphagia 1 (puree);Nectar-thick liquids Temperature Spikes Noted:  No Respiratory Status: Room air History of Recent Intubation: No Behavior/Cognition: Cooperative;Lethargic/Drowsy Oral Cavity Assessment: Within Functional Limits Oral Care Completed by SLP: No Oral Cavity - Dentition: Adequate natural dentition Self-Feeding Abilities: Total assist Patient Positioning: Upright in bed;Postural control adequate for testing Baseline Vocal Quality: Other (comment) (Not observed) Volitional Cough: Cognitively unable to elicit Volitional Swallow: Unable to elicit    Oral/Motor/Sensory Function Overall Oral Motor/Sensory Function: Moderate impairment Facial ROM: Reduced right;Suspected CN VII (facial) dysfunction Facial Symmetry: Abnormal symmetry right;Suspected CN VII (facial) dysfunction Facial Strength: Reduced right;Suspected CN VII (facial) dysfunction   Ice Chips Ice chips: Within functional limits Presentation: Spoon   Thin Liquid Thin Liquid: Impaired Presentation: Spoon Oral Phase Impairments: Reduced labial seal Oral Phase Functional Implications: Right anterior spillage Pharyngeal  Phase Impairments: Throat Clearing - Delayed    Nectar Thick Nectar Thick Liquid: Impaired Presentation: Cup Oral Phase Impairments: Reduced labial seal Oral phase functional implications: Right anterior spillage   Honey Thick Honey Thick Liquid: Not tested   Puree Puree: Impaired Presentation: Spoon Oral Phase Impairments: Reduced labial seal   Solid     Solid: Not tested     Kari Kerth I. Hardin Negus, [place], [place] Office number [phone] Pager [phone]  Horton Marshall 09/30/2019,9:13 AM

## 2019-09-30 NOTE — Progress Notes (Addendum)
Daily Rounding Note  09/30/2019, 8:40 AM  LOS: 0 days   SUBJECTIVE:   Chief complaint: Anemia.  FOBT positive.     Dark but not melenic stool overnight.  Tolerating dysphagia 1 diet but not eating a whole lot. Overnight agitation treated with Ativan.  Currently calm and resting.  OBJECTIVE:         Vital signs in last 24 hours:    Temp:  [97.9 F (36.6 C)-98.5 F (36.9 C)] 98.5 F (36.9 C) (07/22 0803) Pulse Rate:  [56-124] 80 (07/22 0803) Resp:  [14-29] 15 (07/22 0803) BP: (170-213)/(72-111) 170/72 (07/22 0803) SpO2:  [95 %-100 %] 100 % (07/22 0803) Weight:  [92.5 kg] 92.5 kg (07/22 0019) Last BM Date: 09/30/19 Filed Weights   09/28/19 1904 09/30/19 0019  Weight: 96.5 kg 92.5 kg   General: Resting quietly, aroused slightly during exam but not complete fully awake. Heart: RRR. Chest: Clear bilaterally in front.  Nonlabored breathing.  No coughing Abdomen: Nontender, bowel sounds hypoactive.  Not distended. Extremities: No CCE. Neuro/Psych: Resting quietly, did not attempt to awaken him.  Intake/Output from previous day: No intake/output data recorded.  Intake/Output this shift: Total I/O In: -  Out: 200 [Urine:200]  Lab Results: Recent Labs    09/28/19 2115 09/28/19 2115 09/28/19 2139 09/29/19 0602 09/30/19 0518  WBC 9.9  --   --  11.1* 13.3*  HGB 6.4*   < > 9.5* 7.5* 7.9*  HCT 21.8*   < > 28.0* 25.5* 25.4*  PLT 517*  --   --  442* 568*   < > = values in this interval not displayed.   BMET Recent Labs    09/28/19 1906 09/28/19 1906 09/28/19 2139 09/29/19 0602 09/30/19 0518  NA 139   < > 140 138 137  K 4.3   < > 3.9 4.2 4.0  CL 107   < > 104 106 105  CO2 23  --   --  24 22  GLUCOSE 113*   < > 118* 95 126*  BUN 13   < > 14 10 12   CREATININE 1.01   < > 1.00 1.05 0.92  CALCIUM 9.2  --   --  9.1 9.6   < > = values in this interval not displayed.   LFT Recent Labs    09/28/19 1906   PROT 7.7  ALBUMIN 3.4*  AST 26  ALT 17  ALKPHOS 79  BILITOT 0.4   PT/INR No results for input(s): LABPROT, INR in the last 72 hours. Hepatitis Panel No results for input(s): HEPBSAG, HCVAB, HEPAIGM, HEPBIGM in the last 72 hours.  Studies/Results: DG Chest Port 1 View  Result Date: 09/30/2019 CLINICAL DATA:  Acute encephalopathy. EXAM: PORTABLE CHEST 1 VIEW COMPARISON:  August 17, 2019. FINDINGS: Stable cardiomegaly. No pneumothorax or pleural effusion is noted. Decreased bibasilar opacities are noted suggesting improving atelectasis or pneumonia. Old left rib fractures are noted. IMPRESSION: Decreased bibasilar opacities are noted suggesting improving atelectasis or pneumonia. Electronically Signed   By: Marijo Conception M.D.   On: 09/30/2019 08:12    ASSESMENT:   *  Anemia.  FOBT +.  Hx chronic microcytic anemia.  Chronic oral iron.   Hgb 6.4 >> 1 PRBC >> 7.9.  Feraheme 7/21  *   Chronic Plavix.  Last dose 7/20.   *  Leukocytosis. CXR today w improvement atx and PNA  *   Encephalopathy.      PLAN   *  EGD/enteroscopy near time of discharge.   Timing TBD.  Dr Henrene Pastor reluctant to perform high risk colonoscopy.    Azucena Freed  09/30/2019, 8:40 AM Phone [phone]  GI ATTENDING  Interval history data reviewed.  Patient seen and examined.  Agree with interval progress note as outlined above.  Stable after transfusion.  Good response.  Hemoglobin 7.9.  Has received Feraheme.  Awaiting CT results.  If no gross abnormalities, will plan upper endoscopy/enteroscopy.  Time to be determined.  He is HIGH RISK as previously outlined.  Docia Chuck. Geri Seminole., M.D. [facility] Division of Gastroenterology

## 2019-10-01 DIAGNOSIS — E1140 Type 2 diabetes mellitus with diabetic neuropathy, unspecified: Secondary | ICD-10-CM | POA: Diagnosis present

## 2019-10-01 DIAGNOSIS — R195 Other fecal abnormalities: Secondary | ICD-10-CM | POA: Diagnosis not present

## 2019-10-01 DIAGNOSIS — I10 Essential (primary) hypertension: Secondary | ICD-10-CM | POA: Diagnosis not present

## 2019-10-01 DIAGNOSIS — E785 Hyperlipidemia, unspecified: Secondary | ICD-10-CM | POA: Diagnosis present

## 2019-10-01 DIAGNOSIS — D649 Anemia, unspecified: Secondary | ICD-10-CM | POA: Diagnosis not present

## 2019-10-01 DIAGNOSIS — K5521 Angiodysplasia of colon with hemorrhage: Secondary | ICD-10-CM | POA: Diagnosis present

## 2019-10-01 DIAGNOSIS — Z7902 Long term (current) use of antithrombotics/antiplatelets: Secondary | ICD-10-CM | POA: Diagnosis not present

## 2019-10-01 DIAGNOSIS — I130 Hypertensive heart and chronic kidney disease with heart failure and stage 1 through stage 4 chronic kidney disease, or unspecified chronic kidney disease: Secondary | ICD-10-CM | POA: Diagnosis present

## 2019-10-01 DIAGNOSIS — Z8673 Personal history of transient ischemic attack (TIA), and cerebral infarction without residual deficits: Secondary | ICD-10-CM | POA: Diagnosis not present

## 2019-10-01 DIAGNOSIS — E669 Obesity, unspecified: Secondary | ICD-10-CM | POA: Diagnosis present

## 2019-10-01 DIAGNOSIS — E1122 Type 2 diabetes mellitus with diabetic chronic kidney disease: Secondary | ICD-10-CM | POA: Diagnosis present

## 2019-10-01 DIAGNOSIS — Z7189 Other specified counseling: Secondary | ICD-10-CM | POA: Diagnosis not present

## 2019-10-01 DIAGNOSIS — K449 Diaphragmatic hernia without obstruction or gangrene: Secondary | ICD-10-CM | POA: Diagnosis present

## 2019-10-01 DIAGNOSIS — G9341 Metabolic encephalopathy: Secondary | ICD-10-CM | POA: Diagnosis not present

## 2019-10-01 DIAGNOSIS — I69312 Visuospatial deficit and spatial neglect following cerebral infarction: Secondary | ICD-10-CM | POA: Diagnosis not present

## 2019-10-01 DIAGNOSIS — E119 Type 2 diabetes mellitus without complications: Secondary | ICD-10-CM | POA: Diagnosis not present

## 2019-10-01 DIAGNOSIS — D6959 Other secondary thrombocytopenia: Secondary | ICD-10-CM | POA: Diagnosis present

## 2019-10-01 DIAGNOSIS — I272 Pulmonary hypertension, unspecified: Secondary | ICD-10-CM | POA: Diagnosis present

## 2019-10-01 DIAGNOSIS — K922 Gastrointestinal hemorrhage, unspecified: Secondary | ICD-10-CM | POA: Diagnosis present

## 2019-10-01 DIAGNOSIS — I69322 Dysarthria following cerebral infarction: Secondary | ICD-10-CM | POA: Diagnosis not present

## 2019-10-01 DIAGNOSIS — I340 Nonrheumatic mitral (valve) insufficiency: Secondary | ICD-10-CM | POA: Diagnosis present

## 2019-10-01 DIAGNOSIS — Z20822 Contact with and (suspected) exposure to covid-19: Secondary | ICD-10-CM | POA: Diagnosis present

## 2019-10-01 DIAGNOSIS — I69320 Aphasia following cerebral infarction: Secondary | ICD-10-CM | POA: Diagnosis not present

## 2019-10-01 DIAGNOSIS — D631 Anemia in chronic kidney disease: Secondary | ICD-10-CM | POA: Diagnosis present

## 2019-10-01 DIAGNOSIS — G4733 Obstructive sleep apnea (adult) (pediatric): Secondary | ICD-10-CM | POA: Diagnosis present

## 2019-10-01 DIAGNOSIS — I420 Dilated cardiomyopathy: Secondary | ICD-10-CM | POA: Diagnosis present

## 2019-10-01 DIAGNOSIS — N1831 Chronic kidney disease, stage 3a: Secondary | ICD-10-CM | POA: Diagnosis present

## 2019-10-01 DIAGNOSIS — F1010 Alcohol abuse, uncomplicated: Secondary | ICD-10-CM | POA: Diagnosis present

## 2019-10-01 DIAGNOSIS — R451 Restlessness and agitation: Secondary | ICD-10-CM | POA: Diagnosis not present

## 2019-10-01 DIAGNOSIS — D509 Iron deficiency anemia, unspecified: Secondary | ICD-10-CM | POA: Diagnosis present

## 2019-10-01 DIAGNOSIS — Z515 Encounter for palliative care: Secondary | ICD-10-CM | POA: Diagnosis not present

## 2019-10-01 DIAGNOSIS — I69391 Dysphagia following cerebral infarction: Secondary | ICD-10-CM | POA: Diagnosis not present

## 2019-10-01 DIAGNOSIS — I5032 Chronic diastolic (congestive) heart failure: Secondary | ICD-10-CM | POA: Diagnosis present

## 2019-10-01 LAB — URINE CULTURE

## 2019-10-01 LAB — CBC WITH DIFFERENTIAL/PLATELET
Abs Immature Granulocytes: 0.04 10*3/uL (ref 0.00–0.07)
Basophils Absolute: 0.1 10*3/uL (ref 0.0–0.1)
Basophils Relative: 1 %
Eosinophils Absolute: 0.3 10*3/uL (ref 0.0–0.5)
Eosinophils Relative: 2 %
HCT: 31.5 % — ABNORMAL LOW (ref 39.0–52.0)
Hemoglobin: 9.8 g/dL — ABNORMAL LOW (ref 13.0–17.0)
Immature Granulocytes: 0 %
Lymphocytes Relative: 22 %
Lymphs Abs: 3 10*3/uL (ref 0.7–4.0)
MCH: 23.8 pg — ABNORMAL LOW (ref 26.0–34.0)
MCHC: 31.1 g/dL (ref 30.0–36.0)
MCV: 76.5 fL — ABNORMAL LOW (ref 80.0–100.0)
Monocytes Absolute: 1 10*3/uL (ref 0.1–1.0)
Monocytes Relative: 8 %
Neutro Abs: 9.1 10*3/uL — ABNORMAL HIGH (ref 1.7–7.7)
Neutrophils Relative %: 67 %
Platelets: 637 10*3/uL — ABNORMAL HIGH (ref 150–400)
RBC: 4.12 MIL/uL — ABNORMAL LOW (ref 4.22–5.81)
RDW: 23.7 % — ABNORMAL HIGH (ref 11.5–15.5)
WBC: 13.6 10*3/uL — ABNORMAL HIGH (ref 4.0–10.5)
nRBC: 0.1 % (ref 0.0–0.2)

## 2019-10-01 LAB — BASIC METABOLIC PANEL
Anion gap: 12 (ref 5–15)
BUN: 10 mg/dL (ref 8–23)
CO2: 21 mmol/L — ABNORMAL LOW (ref 22–32)
Calcium: 9.8 mg/dL (ref 8.9–10.3)
Chloride: 106 mmol/L (ref 98–111)
Creatinine, Ser: 1.2 mg/dL (ref 0.61–1.24)
GFR calc Af Amer: >60 mL/min (ref >60)
GFR calc non Af Amer: 59 mL/min — ABNORMAL LOW (ref >60)
Glucose, Bld: 111 mg/dL — ABNORMAL HIGH (ref 70–99)
Potassium: 3.9 mmol/L (ref 3.5–5.1)
Sodium: 139 mmol/L (ref 135–145)

## 2019-10-01 LAB — GLUCOSE, CAPILLARY
Glucose-Capillary: 105 mg/dL — ABNORMAL HIGH (ref 70–99)
Glucose-Capillary: 175 mg/dL — ABNORMAL HIGH (ref 70–99)
Glucose-Capillary: 154 mg/dL — ABNORMAL HIGH (ref 70–99)
Glucose-Capillary: 113 mg/dL — ABNORMAL HIGH (ref 70–99)

## 2019-10-01 MED ORDER — RESOURCE THICKENUP CLEAR PO POWD
ORAL | Status: DC | PRN
  Filled 2019-10-01: qty 125

## 2019-10-01 NOTE — Progress Notes (Addendum)
Daily Rounding Note  10/01/2019, 11:13 AM  LOS: 0 days   SUBJECTIVE:   Chief complaint: Anemia, FOBT positive. No stools,  No emesis Remains confused, less agitated.    Ate 100% of this AMs D1 meal.   OBJECTIVE:         Vital signs in last 24 hours:    Temp:  [97.8 F (36.6 C)-98.1 F (36.7 C)] 98.1 F (36.7 C) (07/23 0402) Pulse Rate:  [80-89] 89 (07/23 0402) Resp:  [15-20] 17 (07/23 0402) BP: (138-168)/(74-87) 138/74 (07/23 0402) SpO2:  [94 %-99 %] 95 % (07/23 0402) Weight:  [93 kg] 93 kg (07/23 0050) Last BM Date: 09/30/19 Filed Weights   09/28/19 1904 09/30/19 0019 10/01/19 0050  Weight: 96.5 kg 92.5 kg (!) 93 kg   General: limited speech, dysarthria so not able to understand what he is saying   Heart: RRR Chest: clear bil.  No cough or dyspnea Abdomen: soft, NT, ND, active BS  Extremities: no CCE Neuro/Psych:  Aphasia, dysarthria and R sided paresis.    Intake/Output from previous day: 07/22 0701 - 07/23 0700 In: 0  Out: 1350 [Urine:1350]  Intake/Output this shift: Total I/O In: 360 [P.O.:360] Out: -   Lab Results: Recent Labs    09/29/19 0602 09/30/19 0518 10/01/19 0722  WBC 11.1* 13.3* 13.6*  HGB 7.5* 7.9* 9.8*  HCT 25.5* 25.4* 31.5*  PLT 442* 568* 637*   BMET Recent Labs    09/29/19 0602 09/30/19 0518 10/01/19 0722  NA 138 137 139  K 4.2 4.0 3.9  CL 106 105 106  CO2 24 22 21*  GLUCOSE 95 126* 111*  BUN 10 12 10   CREATININE 1.05 0.92 1.20  CALCIUM 9.1 9.6 9.8   LFT Recent Labs    09/28/19 1906  PROT 7.7  ALBUMIN 3.4*  AST 26  ALT 17  ALKPHOS 79  BILITOT 0.4   PT/INR No results for input(s): LABPROT, INR in the last 72 hours. Hepatitis Panel No results for input(s): HEPBSAG, HCVAB, HEPAIGM, HEPBIGM in the last 72 hours.  Studies/Results: CT HEAD WO CONTRAST  Result Date: 09/30/2019 CLINICAL DATA:  Encephalopathy EXAM: CT HEAD WITHOUT CONTRAST TECHNIQUE:  Contiguous axial images were obtained from the base of the skull through the vertex without intravenous contrast. COMPARISON:  08/11/2019 FINDINGS: Brain: Progression of encephalomalacia at the site of recent left MCA territory infarct with some sparing of the anterior left temporal lobe. No acute hemorrhage. Possible subacute to chronic petechial blood products in the subcortical left frontal lobe. Other dense cortical areas are likely indicative of laminar necrosis. Right hemisphere is normal. No midline shift or other mass effect. Vascular: Mild carotid atherosclerosis Skull: Normal Sinuses/Orbits: Clear sinuses.  Normal orbits. Other: None IMPRESSION: 1. No acute intracranial abnormality. 2. Progression of encephalomalacia at the site of recent left MCA territory infarct. Subacute to chronic petechial blood products in the subcortical left frontal lobe. Electronically Signed   By: Ulyses Jarred M.D.   On: 09/30/2019 19:47   CT ABDOMEN PELVIS W CONTRAST  Result Date: 09/30/2019 CLINICAL DATA:  Gastrointestinal hemorrhage EXAM: CT ABDOMEN AND PELVIS WITH CONTRAST TECHNIQUE: Multidetector CT imaging of the abdomen and pelvis was performed using the standard protocol following bolus administration of intravenous contrast. CONTRAST:  190mL OMNIPAQUE IOHEXOL 300 MG/ML  SOLN COMPARISON:  None. FINDINGS: Lower chest: Mild bibasilar atelectasis. Left ventricular hypertrophy. Mild resultant global cardiomegaly. Hepatobiliary: Liver unremarkable.  Gallbladder unremarkable. Pancreas: Unremarkable. Spleen: Status  post splenectomy. Tiny residual splenules noted within the left subdiaphragmatic region. Adrenals/Urinary Tract: Adrenal glands are unremarkable. Multiple simple cortical cysts are seen within the kidneys bilaterally. The kidneys are otherwise unremarkable. The bladder is unremarkable. Stomach/Bowel: The stomach is unremarkable. The small bowel is unremarkable. Moderate stool seen throughout the colon and rectal  vault, without evidence of obstruction. The large bowel is otherwise unremarkable. Appendix normal. No free intraperitoneal gas or fluid. Vascular/Lymphatic: Retroaortic left renal vein. Moderate atherosclerotic calcification within the abdominal aorta without evidence of aneurysm. No pathologic abdominal or pelvic adenopathy. Reproductive: Prostate gland and seminal vesicles are unremarkable. Other: Rectum unremarkable.  Tiny fat containing umbilical hernia Musculoskeletal: No acute bone abnormality. Degenerative changes are seen within the lumbar spine. Multiple healed left rib fractures are noted. Multiple healed right rib fractures are noted. IMPRESSION: No definite source identified for the patient's reported gastrointestinal hemorrhage. Incidental findings as noted above. Aortic Atherosclerosis (ICD10-I70.0). Electronically Signed   By: Fidela Salisbury MD   On: 09/30/2019 20:10   DG Chest Port 1 View  Result Date: 09/30/2019 CLINICAL DATA:  Acute encephalopathy. EXAM: PORTABLE CHEST 1 VIEW COMPARISON:  August 17, 2019. FINDINGS: Stable cardiomegaly. No pneumothorax or pleural effusion is noted. Decreased bibasilar opacities are noted suggesting improving atelectasis or pneumonia. Old left rib fractures are noted. IMPRESSION: Decreased bibasilar opacities are noted suggesting improving atelectasis or pneumonia. Electronically Signed   By: Marijo Conception M.D.   On: 09/30/2019 08:12   Scheduled Meds: . acetaminophen  650 mg Oral Q8H  . amLODipine  10 mg Oral Daily  . atorvastatin  80 mg Oral Daily  . folic acid  1 mg Oral Daily  . insulin aspart  0-9 Units Subcutaneous TID WC  . metoprolol tartrate  25 mg Oral BID  . pantoprazole  40 mg Oral Q0600  . thiamine  100 mg Oral Daily   Continuous Infusions: PRN Meds:.hydrALAZINE, LORazepam **OR** LORazepam, Resource ThickenUp Clear   ASSESMENT:   *  Anemia.  FOBT +.  Hx chronic microcytic anemia.  Iron deficient per labs of 08/23/19.  PTA oral iron,  currently on hold to avoid mistaking black stool for melena.     Hgb 6.4 >> 1 PRBC >> 7.9 >> 9.8.   Feraheme 7/21 CTAP w contrast: no soource for hemorrhage or anemia.    *   Chronic Plavix.  Last dose 7/20.   *  Leukocytosis. CXR today w improvement atx and PNA.  No abx in use.    *   Encephalopathy.    *   Dysphagia.  BS swallow eval, mild aspiration risk, cleared for D1, nectar thicks.    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      PLAN   *   Timing of EGD/Enteroscopy per Dr Henrene Pastor.  Will reach Plavix 5 d hold on 7/25.      Azucena Freed  10/01/2019, 11:13 AM Phone [phone]  GI ATTENDING  Interval history and data reviewed.  Patient personally seen and examined.  Patient is stable.  CT scan did not show any significant GI luminal pathology.  Hemoglobin stable at 9.8.  He did receive Feraheme.  On p.o. PPI.  Plans for upper endoscopy tomorrow to evaluate anemia and Hemoccult-positive stool.  Patient's last dose of Plavix was 3 days ago.  Patient is high risk given his comorbidities.The nature of the procedure, as well as the risks, benefits, and alternatives were carefully and thoroughly reviewed  with the patient. Ample time for discussion and questions allowed. The patient understood, was satisfied, and agreed to proceed.  Docia Chuck. Geri Seminole., M.D. [facility] Division of Gastroenterology

## 2019-10-01 NOTE — Progress Notes (Signed)
  Speech Language Pathology Treatment: Dysphagia  Patient Details Name: Malik [name] MRN: [id number] DOB: 1945/05/05 Today's Date: 10/01/2019 Time: 8022-3361 SLP Time Calculation (min) (ACUTE ONLY): 13 min  Assessment / Plan / Recommendation Clinical Impression  Pt was seen for dysphagia treatment. Ryan, RN indicated that the pt did not want breakfast. He consumed puree solids and nectar thick liquids without overt s/sx of aspiration. He continues to demonstrate anterior spillage and difficulty sucking from a straw due to labial weakness. Pt appears to be at baseline at this time with regard to his swallow function. It is recommended that SLP services be continued following discharge for dysphagia and/or aphasia  treatment. However, further acute SLP services are not clinically indicated at this time.    HPI HPI: Pt is a 74 y.o. male with medical history significant for TBI with aphasia, recent CVA with right upper extremity hemiplegia on dual antiplatelet therapy, history of iron deficiency anemia, type 2 diabetes, CKD stage IIIa, hypertension, OSA, hyperlipidemia and alcohol abuse who presented with concerns of acute anemia. MBS 08/17/19: moderate oral and more mild pharyngeal dysphagia. He had more oral holding than was observed at bedside. Significant amounts of R-sided anterior spillage was noted with liquids via spoon and cup. There is also posterior spillage into the pharynx, reaching the valleculae primarily but at times the pyriform sinuses. Anterior and posterior spillage is reduced with nectar thick liquids. Thin liquids by cup are aspirated before the swallow with a spontaneous cough reflex triggered. Most of the thin liquids from a spoon were lost anteriorly. Pt had improved airway protection before and during the swallow with nectar thick liquids, with penetration x1 after the swallow with oral residue from a cup sip, although question if he could have also aspirated some secretions. An  NPO status was recommended at that time but he was transitioned to a puree diet with nectar thick liquids following diagnostic treatment on that date.       SLP Plan  All goals met;Discharge SLP treatment due to (comment)       Recommendations  Diet recommendations: Dysphagia 1 (puree);Nectar-thick liquid Liquids provided via: Cup;No straw Medication Administration: Crushed with puree Supervision: Staff to assist with self feeding;Full supervision/cueing for compensatory strategies Compensations: Slow rate;Small sips/bites;Follow solids with liquid;Monitor for anterior loss Postural Changes and/or Swallow Maneuvers: Seated upright 90 degrees;Upright 30-60 min after meal                Oral Care Recommendations: Oral care BID Follow up Recommendations: None SLP Visit Diagnosis: Dysphagia, oropharyngeal phase (R13.12) Plan: All goals met;Discharge SLP treatment due to (comment)       Leahmarie Gasiorowski I. Hardin Negus, [place], [place] Office number [phone] Pager Malik [name] 10/01/2019, 12:00 PM

## 2019-10-01 NOTE — Evaluation (Signed)
Occupational Therapy Evaluation Patient Details Name: Malik [name] MRN: [id number] DOB: [birth year] Today's Date: 10/01/2019    History of Present Illness 74 y.o. male presenting from [facility] with acute on chronic anemia, possible GI bleed, and acute metabolic encephalopathy. 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    Clinical Impression   No family present at bedside to confirm PLOF. Per chart review, patient was most likely requiring assist for BADLs/IADLs at baseline. Patient currently presents below baseline level of function requiring Mod-Max A for bed mobility, Max A for bed level BADLs and +2 assist for OOB activity. Pt. Unable to progress beyond EOB this date 2/2 lethargy. Patient also limited by communication deficits from acute metabolic encephalopathy on chronic expressive aphasia and dysarthria, decreased static/dynamic sitting/standing balance, and pain with movement of RUE>RLE. Patient would benefit from continued acute OT service to maximize safety and independence with BADLs, functional transfers, and mobility. Given patient's currenlty level of function, recommendation for SNF placement post d/c.     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   Transfers Overall  transfer level: Needs assistance               General transfer comment: Unable to progress beyond sitting EOB without +2 assist.     Balance Overall balance assessment: Needs assistance Sitting-balance support: Single extremity supported;Feet supported Sitting balance-Leahy Scale: Poor Sitting balance - Comments: Pt. required Mod to Max A to maintain static sitting balance and EOB. 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                                    ADL either performed or assessed with clinical judgement   ADL Overall ADL's : Needs assistance/impaired     Grooming: Maximal assistance;Wash/dry hands;Wash/dry face;Oral care Grooming Details (indicate cue type and reason): Max A to wash face in supine with cues for initiation. Upper Body Bathing: Maximal assistance;Sitting;Cueing for sequencing   Lower Body Bathing: Maximal assistance;Sitting/lateral leans;Cueing for sequencing Lower Body Bathing Details (indicate cue type and reason): Max A for LB bathing in supine Upper Body Dressing : Maximal assistance;Sitting Upper Body Dressing Details (indicate cue type and reason): Max A to don anterior hospital gown seated EOB with assist to maintain sitting balance.  Lower Body Dressing: Maximal assistance;Bed level Lower Body Dressing Details (indicate cue type and reason): Max A to don footwear lying supine in bed.      Toileting- Clothing Manipulation and Hygiene: Maximal assistance;Bed level Toileting - Clothing Manipulation Details (indicate cue type and reason): Max A for hygiene/clothing management in supine.        General ADL Comments: Patient very lethargic. Unable to hold casual conversation but attempting to make needs known.  Vision         Perception     Praxis      Pertinent Vitals/Pain Pain Assessment: Faces Faces Pain Scale: Hurts little more Pain Location: RUE/RLE with movement  Pain Intervention(s): Monitored during session     Hand  Dominance Right   Extremity/Trunk Assessment Upper Extremity Assessment Upper Extremity Assessment: RUE deficits/detail RUE Deficits / Details: Limited assessment of PROM 2/2 1 finger subluxation. Patient unable to follow cues for assessment of AROM with functional activity. Noted increased tone at elbow.  RUE: Unable to fully assess due to pain RUE Sensation:  (Unable to assess 2/2 level of arousal ) RUE Coordination: decreased gross motor;decreased fine motor   Lower Extremity Assessment Lower Extremity Assessment: Defer to PT evaluation       Communication Communication Communication: Expressive difficulties;Receptive difficulties   Cognition Arousal/Alertness: Lethargic Behavior During Therapy: Flat affect Overall Cognitive Status: Impaired/Different from baseline Area of Impairment: Orientation;Attention;Memory;Following commands;Safety/judgement;Problem solving;Awareness                 Orientation Level: Person;Place;Time;Situation Current Attention Level: Focused Memory: Decreased short-term memory Following Commands: Follows one step commands inconsistently Safety/Judgement: Decreased awareness of safety;Decreased awareness of deficits Awareness: Intellectual Problem Solving: Slow processing;Decreased initiation;Requires verbal cues;Requires tactile cues General Comments: Pt. able to follow 1-step verbal commands intermittently with ~50% accuracy.    General Comments       Exercises     Shoulder Instructions      Home Living Family/patient expects to be discharged to:: Skilled nursing facility   Available Help at Discharge: [place]                             Additional Comments: Pt unable to provide info and no family present       Prior Functioning/Environment Level of Independence: Needs assistance  Gait / Transfers Assistance Needed: Pt. most likely required assist for functional transfers.  ADL's / Homemaking  Assistance Needed: Pt. most likely required assist with BADLs/IADLs.  Communication / Swallowing Assistance Needed: Expressive aphasia and dysphagia from previous CVA Comments: Pt. unable to provide info and no family present.         OT Problem List: Decreased strength;Decreased range of motion;Decreased activity tolerance;Impaired balance (sitting and/or standing);Decreased coordination;Decreased cognition;Decreased safety awareness;Decreased knowledge of use of DME or AE;Impaired tone;Impaired UE functional use;Pain      OT Treatment/Interventions: Self-care/ADL training;Therapeutic exercise;Neuromuscular education;Energy conservation;DME and/or AE instruction;Manual therapy;Splinting;Therapeutic activities;Cognitive remediation/compensation;Patient/family education;Balance training    OT Goals(Current goals can be found in the care plan section) Acute Rehab OT Goals Patient Stated Goal: No goals stated.  OT Goal Formulation: Patient unable to participate in goal setting Time For Goal Achievement: 10/15/19 Potential to Achieve Goals: Good ADL Goals Pt Will Perform Grooming: with set-up;sitting Pt Will Perform Upper Body Dressing: sitting;with set-up Additional ADL Goal #1: Patient will follow 1-step verbal commands with 85% accuracy in prep for BADLs. Additional ADL Goal #2: Patient will maintain focused attention to task without verbal cues during BADLs. Additional ADL Goal #3: Patient will demonstrate increased static sitting balance at EOB in prep for BADLs and functional transfers.  OT Frequency: Min 2X/week   Barriers to D/C:            Co-evaluation              AM-PAC OT "6 Clicks" Daily Activity     Outcome Measure Help from another person eating meals?:  A Lot Help from another person taking care of personal grooming?: A Lot Help from another person toileting, which includes using toliet, bedpan, or urinal?: Total Help from another person bathing (including  washing, rinsing, drying)?: A Lot Help from another person to put on and taking off regular upper body clothing?: A Lot Help from another person to put on and taking off regular lower body clothing?: A Lot 6 Click Score: 11   End of Session    Activity Tolerance: Patient limited by lethargy Patient left: in bed;with bed alarm set;with call bell/phone within reach  OT Visit Diagnosis: Unsteadiness on feet (R26.81);Muscle weakness (generalized) (M62.81);Ataxia, unspecified (R27.0);Pain Pain - Right/Left: Right Pain - part of body: Shoulder;Arm;Hand                Time: 6067-7034 OT Time Calculation (min): 35 min Charges:  OT General Charges $OT Visit: 1 Visit OT Evaluation $OT Eval Moderate Complexity: 1 Mod OT Treatments $Self Care/Home Management : 23-37 mins  Thelmer Legler H. OTR/L Supplemental OT, Department of rehab services [phone]  Agata Lucente R H. 10/01/2019, 11:46 AM

## 2019-10-01 NOTE — Progress Notes (Signed)
PROGRESS NOTE  Gonsalo Cuthbertson SVX:[id number] DOB: 17-Mar-1945 DOA: 09/28/2019 PCP: [facility] [place], NP  HPI/Recap of past 24 hours: HPI from Dr Simon Rhein is a 74 y.o. male with medical history significant for TBI with aphasia, recent CVA with right upper extremity hemiplegia on dual antiplatelet therapy, history of iron deficiency anemia, type 2 diabetes, CKD stage IIIa, hypertension, OSA, hyperlipidemia and alcohol abuse who presents with concerns of acute anemia. Patient received lab work at his nursing facility and was noted to have hemoglobin of 6.4 with baseline hemoglobin of 7-8. Of note, pt was started on Plavix and aspirin in June due to a new left MCA infarct and also started on oral iron supplement for iron deficiency anemia.  Patient found to be Hemoccult positive during this admission.  He follows with Dr. Clide Deutscher at [facility].  Reportedly had heme positive stool back in 2019 without any intervention.  Unsure of last colonoscopy. In the ED, he was afebrile and hypertensive with systolic up to 233A.  Initially his hemoglobin came back around 9 but on repeat it was 6.4 and patient and his son agreed to have 1 unit of PRBC transfusion. Creatinine and BUN normal.   Patient admitted for further management.     Today, patient met resting, easily arousable.  Denied any new complaints     Assessment/Plan: Principal Problem:   Acute anemia Active Problems:   Essential hypertension   Chronic diastolic congestive heart failure (HCC)   Diabetes mellitus type 2 in nonobese (HCC)   CKD (chronic kidney disease), stage IIIa   Alcohol abuse   History of CVA (cerebrovascular accident)   ??Possible GI bleed Acute on chronic anemia Hemoglobin 6.4 on admission, baseline around 7-8 FOBT positive CT abdomen/pelvis with no definitive source identified for bleeding S/p transfusion of 1 unit of PRBC S/p IV Feraheme, continue oral iron supplementation  when able On Plavix, aspirin PTA, currently held Continue PPI GI consulted, appreciate recs, plan for endoscopy on 10/02/2019 Daily CBC  Acute metabolic encephalopathy Improving Unknown etiology Afebrile with leukocytosis (noted to be chronic) Urine culture, suggest recollection Chest x-ray showed decreased bibasilar opacities suggesting improving atelectasis or pneumonia CT head without contrast no acute intracranial abnormality Fall precautions  History of CVA/TBI/HLD Noted to be aphasic at baseline, with right upper extremity hemiplegia Recent stroke in June/2021 Hold aspirin and Plavix for now due to anemia Continue Lipitor when able Telemetry  Chronic diastolic HF/pulmonary hypertension Does not appear overloaded Echo done on 08/2019 showed EF of 50 to 55%, no regional wall motion abnormality, grade 2 diastolic dysfunction Monitor closely   CKD stage IIIa Creatinine stable Daily BMP  Hypertension Continue home amlodipine, metoprolol IV hydralazine as needed  Diabetes mellitus type 2 Last A1c 5.4 SSI, Accu-Cheks, hypoglycemic protocol Hold home Metformin  Thrombocytosis Possibly due to anemia Daily CBC  Alcohol abuse Alcohol level less than 10 CIWA protocol  Severe dysphagia Seen by SLP in 08/2019, recommend dysphagia 1 diet and nectar thick liquid SLP consulted to reevaluate       Malnutrition Type:      Malnutrition Characteristics:      Nutrition Interventions:       Estimated body mass index is 29.41 kg/m as calculated from the following:   Height as of this encounter: '5\' 10"'  (1.778 m).   Weight as of this encounter: 93 kg.     Code Status: Full  Family Communication: None at bedside  Disposition Plan: Status is: Inpatient  Dispo: The patient is from: SNF              Anticipated d/c is to: SNF              Anticipated d/c date is: 3 days              Patient currently is not medically stable to  d/c.     Consultants:  GI  Procedures:  None  Antimicrobials:  None  DVT prophylaxis: SCDs   Objective: Vitals:   09/30/19 1943 10/01/19 0050 10/01/19 0402 10/01/19 1218  BP: (!) 168/78 (!) 152/87 (!) 138/74 124/66  Pulse: 84 86 89 81  Resp: '17 20 17 16  ' Temp: 97.8 F (36.6 C) 97.8 F (36.6 C) 98.1 F (36.7 C) 98.1 F (36.7 C)  TempSrc: Oral Oral Oral Oral  SpO2: 94% 99% 95% 100%  Weight:  (!) 93 kg    Height:        Intake/Output Summary (Last 24 hours) at 10/01/2019 1819 Last data filed at 10/01/2019 1740 Gross per 24 hour  Intake 840 ml  Output 1050 ml  Net -210 ml   Filed Weights   09/28/19 1904 09/30/19 0019 10/01/19 0050  Weight: 96.5 kg 92.5 kg (!) 93 kg    Exam:  General: NAD, aphasic  Cardiovascular: S1, S2 present  Respiratory: CTAB  Abdomen: Soft, nontender, nondistended, bowel sounds present  Musculoskeletal: No bilateral pedal edema noted  Skin: Normal  Psychiatry:  Unable to assess  Neurology: Unable to assess    Data Reviewed: CBC: Recent Labs  Lab 09/28/19 2115 09/28/19 2139 09/29/19 0602 09/30/19 0518 10/01/19 0722  WBC 9.9  --  11.1* 13.3* 13.6*  NEUTROABS 5.2  --  6.5 9.2* 9.1*  HGB 6.4* 9.5* 7.5* 7.9* 9.8*  HCT 21.8* 28.0* 25.5* 25.4* 31.5*  MCV 76.8*  --  76.1* 74.9* 76.5*  PLT 517*  --  442* 568* 161*   Basic Metabolic Panel: Recent Labs  Lab 09/28/19 1906 09/28/19 2139 09/29/19 0602 09/30/19 0518 10/01/19 0722  NA 139 140 138 137 139  K 4.3 3.9 4.2 4.0 3.9  CL 107 104 106 105 106  CO2 23  --  24 22 21*  GLUCOSE 113* 118* 95 126* 111*  BUN '13 14 10 12 10  ' CREATININE 1.01 1.00 1.05 0.92 1.20  CALCIUM 9.2  --  9.1 9.6 9.8   GFR: Estimated Creatinine Clearance: 60.9 mL/min (by C-G formula based on SCr of 1.2 mg/dL). Liver Function Tests: Recent Labs  Lab 09/28/19 1906  AST 26  ALT 17  ALKPHOS 79  BILITOT 0.4  PROT 7.7  ALBUMIN 3.4*   No results for input(s): LIPASE, AMYLASE in the last  168 hours. No results for input(s): AMMONIA in the last 168 hours. Coagulation Profile: No results for input(s): INR, PROTIME in the last 168 hours. Cardiac Enzymes: No results for input(s): CKTOTAL, CKMB, CKMBINDEX, TROPONINI in the last 168 hours. BNP (last 3 results) No results for input(s): PROBNP in the last 8760 hours. HbA1C: No results for input(s): HGBA1C in the last 72 hours. CBG: Recent Labs  Lab 09/30/19 1730 09/30/19 2106 10/01/19 0619 10/01/19 1101 10/01/19 1615  GLUCAP 97 109* 105* 175* 154*   Lipid Profile: No results for input(s): CHOL, HDL, LDLCALC, TRIG, CHOLHDL, LDLDIRECT in the last 72 hours. Thyroid Function Tests: No results for input(s): TSH, T4TOTAL, FREET4, T3FREE, THYROIDAB in the last 72 hours. Anemia Panel: No results for input(s): VITAMINB12, FOLATE, FERRITIN, TIBC,  IRON, RETICCTPCT in the last 72 hours. Urine analysis:    Component Value Date/Time   COLORURINE YELLOW 09/30/2019 1005   APPEARANCEUR CLEAR 09/30/2019 1005   APPEARANCEUR Clear 06/11/2011 2046   LABSPEC 1.014 09/30/2019 1005   LABSPEC 1.025 06/11/2011 2046   PHURINE 7.0 09/30/2019 1005   GLUCOSEU NEGATIVE 09/30/2019 1005   GLUCOSEU Negative 06/11/2011 2046   HGBUR NEGATIVE 09/30/2019 1005   BILIRUBINUR NEGATIVE 09/30/2019 1005   BILIRUBINUR Negative 06/11/2011 2046   [place] 09/30/2019 1005   PROTEINUR 30 (A) 09/30/2019 1005   NITRITE NEGATIVE 09/30/2019 1005   LEUKOCYTESUR NEGATIVE 09/30/2019 1005   LEUKOCYTESUR Negative 06/11/2011 2046   Sepsis Labs: '@LABRCNTIP' (procalcitonin:4,lacticidven:4)  ) Recent Results (from the past 240 hour(s))  SARS Coronavirus 2 by RT PCR (hospital order, performed in [place] hospital lab) Nasopharyngeal Nasopharyngeal Swab     Status: None   Collection Time: 09/28/19  8:51 PM   Specimen: Nasopharyngeal Swab  Result Value Ref Range Status   SARS Coronavirus 2 NEGATIVE NEGATIVE Final    Comment: (NOTE) SARS-CoV-2 target  nucleic acids are NOT DETECTED.  The SARS-CoV-2 RNA is generally detectable in upper and lower respiratory specimens during the acute phase of infection. The lowest concentration of SARS-CoV-2 viral copies this assay can detect is 250 copies / mL. A negative result does not preclude SARS-CoV-2 infection and should not be used as the sole basis for treatment or other patient management decisions.  A negative result may occur with improper specimen collection / handling, submission of specimen other than nasopharyngeal swab, presence of viral mutation(s) within the areas targeted by this assay, and inadequate number of viral copies (<250 copies / mL). A negative result must be combined with clinical observations, patient history, and epidemiological information.  Fact Sheet for Patients:   StrictlyIdeas.no  Fact Sheet for Healthcare Providers: BankingDealers.co.za  This test is not yet approved or  cleared by the [place] FDA and has been authorized for detection and/or diagnosis of SARS-CoV-2 by FDA under an Emergency Use Authorization (EUA).  This EUA will remain in effect (meaning this test can be used) for the duration of the COVID-19 declaration under Section 564(b)(1) of the Act, 21 U.S.C. section 360bbb-3(b)(1), unless the authorization is terminated or revoked sooner.  Performed at [facility], [place] [address], [place], [place] [address]   Culture, Urine     Status: Abnormal   Collection Time: 09/30/19 12:56 PM   Specimen: Urine, Random  Result Value Ref Range Status   Specimen Description URINE, RANDOM  Final   Special Requests   Final    NONE Performed at [facility], [place] [address], [place], [place] [address]    Culture MULTIPLE SPECIES PRESENT, SUGGEST RECOLLECTION (A)  Final   Report Status 10/01/2019 FINAL  Final      Studies: CT HEAD WO CONTRAST  Result Date: 09/30/2019 CLINICAL DATA:   Encephalopathy EXAM: CT HEAD WITHOUT CONTRAST TECHNIQUE: Contiguous axial images were obtained from the base of the skull through the vertex without intravenous contrast. COMPARISON:  08/11/2019 FINDINGS: Brain: Progression of encephalomalacia at the site of recent left MCA territory infarct with some sparing of the anterior left temporal lobe. No acute hemorrhage. Possible subacute to chronic petechial blood products in the subcortical left frontal lobe. Other dense cortical areas are likely indicative of laminar necrosis. Right hemisphere is normal. No midline shift or other mass effect. Vascular: Mild carotid atherosclerosis Skull: Normal Sinuses/Orbits: Clear sinuses.  Normal orbits.  Other: None IMPRESSION: 1. No acute intracranial abnormality. 2. Progression of encephalomalacia at the site of recent left MCA territory infarct. Subacute to chronic petechial blood products in the subcortical left frontal lobe. Electronically Signed   By: Ulyses Jarred M.D.   On: 09/30/2019 19:47   CT ABDOMEN PELVIS W CONTRAST  Result Date: 09/30/2019 CLINICAL DATA:  Gastrointestinal hemorrhage EXAM: CT ABDOMEN AND PELVIS WITH CONTRAST TECHNIQUE: Multidetector CT imaging of the abdomen and pelvis was performed using the standard protocol following bolus administration of intravenous contrast. CONTRAST:  185m OMNIPAQUE IOHEXOL 300 MG/ML  SOLN COMPARISON:  None. FINDINGS: Lower chest: Mild bibasilar atelectasis. Left ventricular hypertrophy. Mild resultant global cardiomegaly. Hepatobiliary: Liver unremarkable.  Gallbladder unremarkable. Pancreas: Unremarkable. Spleen: Status post splenectomy. Tiny residual splenules noted within the left subdiaphragmatic region. Adrenals/Urinary Tract: Adrenal glands are unremarkable. Multiple simple cortical cysts are seen within the kidneys bilaterally. The kidneys are otherwise unremarkable. The bladder is unremarkable. Stomach/Bowel: The stomach is unremarkable. The small bowel is  unremarkable. Moderate stool seen throughout the colon and rectal vault, without evidence of obstruction. The large bowel is otherwise unremarkable. Appendix normal. No free intraperitoneal gas or fluid. Vascular/Lymphatic: Retroaortic left renal vein. Moderate atherosclerotic calcification within the abdominal aorta without evidence of aneurysm. No pathologic abdominal or pelvic adenopathy. Reproductive: Prostate gland and seminal vesicles are unremarkable. Other: Rectum unremarkable.  Tiny fat containing umbilical hernia Musculoskeletal: No acute bone abnormality. Degenerative changes are seen within the lumbar spine. Multiple healed left rib fractures are noted. Multiple healed right rib fractures are noted. IMPRESSION: No definite source identified for the patient's reported gastrointestinal hemorrhage. Incidental findings as noted above. Aortic Atherosclerosis (ICD10-I70.0). Electronically Signed   By: AFidela SalisburyMD   On: 09/30/2019 20:10    Scheduled Meds: . acetaminophen  650 mg Oral Q8H  . amLODipine  10 mg Oral Daily  . atorvastatin  80 mg Oral Daily  . folic acid  1 mg Oral Daily  . insulin aspart  0-9 Units Subcutaneous TID WC  . metoprolol tartrate  25 mg Oral BID  . pantoprazole  40 mg Oral Q0600  . thiamine  100 mg Oral Daily    Continuous Infusions:   LOS: 0 days     NAlma Friendly MD [facility]  If 7PM-7AM, please contact night-coverage [email or web address] 10/01/2019, 6:19 PM

## 2019-10-01 NOTE — Evaluation (Signed)
Physical Therapy Evaluation Patient Details Name: Malik [name] MRN: [id number] DOB: 01-11-1946 Today's Date: 10/01/2019   History of Present Illness  74 y.o. male presenting from [facility] with acute on chronic anemia, possible GI bleed, and acute metabolic encephalopathy. 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   Clinical Impression  Pt admitted with above diagnosis. Evaluation was limited due to the need for assist of 2.  Pt required max A for supine to long sit position.  He demonstrate ROM and strength deficits on R side. Additionally, pt pushing with L side during long sitting and when moved in bed.  Pt with aphasia and with limited communication during evaluation.  No family present to determine PLOF and reviewed notes from CVA 08/2019 and also unable to determine PLOF.   Pt currently with functional limitations due to the deficits listed below (see PT Problem List). Pt will benefit from skilled PT to increase their independence and safety with mobility to allow discharge to the venue listed below.       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      Mobility  Bed Mobility Overal bed mobility: Needs Assistance Bed Mobility: Supine to Sit;Sit to Supine;Rolling Rolling: Max assist   Supine to sit: Max assist;HOB elevated Sit to supine: Max assist;HOB elevated   General bed mobility comments: Required max cues; Did not have assist of 2 to move to EOB so performed 3 x supine to long sit with pt pulling with L UE and assist for trunk  Transfers                    Ambulation/Gait                 Stairs            Wheelchair Mobility    Modified Rankin (Stroke Patients Only)       Balance Overall balance assessment: Needs assistance Sitting-balance support: Single extremity supported Sitting balance-Leahy Scale: Poor       Standing balance-Leahy Scale: Zero                               Pertinent Vitals/Pain Pain Assessment: Faces Faces Pain Scale: Hurts little more Pain Location: RUE/RLE with movement  Pain Intervention(s): Monitored during session;Limited activity within patient's tolerance;Repositioned    Home Living Family/patient expects to be discharged to:: Skilled nursing facility   Available Help at Discharge: Kimberly             Additional Comments: Pt unable to provide info and no family present     Prior Function Level of Independence: Needs assistance   Gait / Transfers Assistance Needed: Pt. most likely required assist for functional transfers.   ADL's / Homemaking Assistance Needed: Pt. most likely required assist with BADLs/IADLs.   Comments: Pt unable to provide info, and no family available.  Per chart review, pt discharged from CIR in 2019 at supervision level with sister providing assist/supervision.  At last admission (08/2019) pt was max A x 2 due to new CVA.  Hand Dominance        Extremity/Trunk Assessment   Upper Extremity Assessment Upper Extremity Assessment: Defer to OT evaluation    Lower Extremity Assessment Lower Extremity Assessment: LLE deficits/detail;RLE deficits/detail RLE Deficits / Details: ROM: R knee lacking 10 ext with tight end feel, pt tending to keep flexed at risk for contracture; MMT: unable to follow commands, demonstrating at least 1/5 throughout LLE Deficits / Details: ROM WFL; MMT at least 3/5 but unable to follow further commands    Cervical / Trunk Assessment Cervical / Trunk Assessment: Normal  Communication   Communication:  Expressive difficulties;Receptive difficulties  Cognition Arousal/Alertness: Lethargic Behavior During Therapy: Flat affect Overall Cognitive Status: No family/caregiver present to determine baseline cognitive functioning                         Following Commands: Follows one step commands inconsistently     Problem Solving: Slow processing;Decreased initiation;Requires verbal cues;Requires tactile cues;Difficulty sequencing General Comments: Pt. able to follow 1-step verbal commands intermittently with ~50% accuracy.       General Comments General comments (skin integrity, edema, etc.): VSS    Exercises General Exercises - Lower Extremity Ankle Circles/Pumps: AROM;Left;PROM;Right;10 reps Heel Slides: AROM;Left;PROM;Right;10 reps Other Exercises Other Exercises: knee ext stretch 30 sec x 3 on right   Assessment/Plan    PT Assessment Patient needs continued PT services  PT Problem List Decreased strength;Decreased mobility;Decreased safety awareness;Decreased range of motion;Decreased coordination;Decreased knowledge of precautions;Decreased activity tolerance;Decreased cognition;Decreased balance;Decreased knowledge of use of DME;Pain       PT Treatment Interventions DME instruction;Therapeutic activities;Cognitive remediation;Therapeutic exercise;Patient/family education;Balance training;Functional mobility training;Neuromuscular re-education    PT Goals (Current goals can be found in the Care Plan section)  Acute Rehab PT Goals Patient Stated Goal: No goals stated.  PT Goal Formulation: Patient unable to participate in goal setting Time For Goal Achievement: 10/15/19 Potential to Achieve Goals: Fair    Frequency Min 3X/week   Barriers to discharge Decreased caregiver support      Co-evaluation               AM-PAC PT "6 Clicks" Mobility  Outcome Measure Help needed turning from your back to your side while in a flat bed without using bedrails?:  Total Help needed moving from lying on your back to sitting on the side of a flat bed without using bedrails?: Total Help needed moving to and from a bed to a chair (including a wheelchair)?: Total Help needed standing up from a chair using your arms (e.g., wheelchair or bedside chair)?: Total Help needed to walk in hospital room?: Total Help needed climbing 3-5 steps with a railing? : Total 6 Click Score: 6    End of Session   Activity Tolerance: Patient limited by lethargy Patient left: in bed;with call bell/phone within reach;with bed alarm set Nurse Communication: Mobility status PT Visit Diagnosis: Unsteadiness on feet (R26.81);Muscle weakness (generalized) (M62.81);Hemiplegia and hemiparesis Hemiplegia - Right/Left: Right Hemiplegia - caused by: Cerebral infarction    Time: 4268-3419 PT Time Calculation (min) (ACUTE ONLY): 20 min   Charges:   PT Evaluation $PT Eval Low Complexity: 1 Low          Mazin Emma, PT Acute Rehab Services Pager [phone] [facility] 10/01/2019, 5:59 PM

## 2019-10-01 NOTE — H&P (View-Only) (Signed)
Daily Rounding Note  10/01/2019, 11:13 AM  LOS: 0 days   SUBJECTIVE:   Chief complaint: Anemia, FOBT positive. No stools,  No emesis Remains confused, less agitated.    Ate 100% of this AMs D1 meal.   OBJECTIVE:         Vital signs in last 24 hours:    Temp:  [97.8 F (36.6 C)-98.1 F (36.7 C)] 98.1 F (36.7 C) (07/23 0402) Pulse Rate:  [80-89] 89 (07/23 0402) Resp:  [15-20] 17 (07/23 0402) BP: (138-168)/(74-87) 138/74 (07/23 0402) SpO2:  [94 %-99 %] 95 % (07/23 0402) Weight:  [93 kg] 93 kg (07/23 0050) Last BM Date: 09/30/19 Filed Weights   09/28/19 1904 09/30/19 0019 10/01/19 0050  Weight: 96.5 kg 92.5 kg (!) 93 kg   General: limited speech, dysarthria so not able to understand what he is saying   Heart: RRR Chest: clear bil.  No cough or dyspnea Abdomen: soft, NT, ND, active BS  Extremities: no CCE Neuro/Psych:  Aphasia, dysarthria and R sided paresis.    Intake/Output from previous day: 07/22 0701 - 07/23 0700 In: 0  Out: 1350 [Urine:1350]  Intake/Output this shift: Total I/O In: 360 [P.O.:360] Out: -   Lab Results: Recent Labs    09/29/19 0602 09/30/19 0518 10/01/19 0722  WBC 11.1* 13.3* 13.6*  HGB 7.5* 7.9* 9.8*  HCT 25.5* 25.4* 31.5*  PLT 442* 568* 637*   BMET Recent Labs    09/29/19 0602 09/30/19 0518 10/01/19 0722  NA 138 137 139  K 4.2 4.0 3.9  CL 106 105 106  CO2 24 22 21*  GLUCOSE 95 126* 111*  BUN 10 12 10   CREATININE 1.05 0.92 1.20  CALCIUM 9.1 9.6 9.8   LFT Recent Labs    09/28/19 1906  PROT 7.7  ALBUMIN 3.4*  AST 26  ALT 17  ALKPHOS 79  BILITOT 0.4   PT/INR No results for input(s): LABPROT, INR in the last 72 hours. Hepatitis Panel No results for input(s): HEPBSAG, HCVAB, HEPAIGM, HEPBIGM in the last 72 hours.  Studies/Results: CT HEAD WO CONTRAST  Result Date: 09/30/2019 CLINICAL DATA:  Encephalopathy EXAM: CT HEAD WITHOUT CONTRAST TECHNIQUE:  Contiguous axial images were obtained from the base of the skull through the vertex without intravenous contrast. COMPARISON:  08/11/2019 FINDINGS: Brain: Progression of encephalomalacia at the site of recent left MCA territory infarct with some sparing of the anterior left temporal lobe. No acute hemorrhage. Possible subacute to chronic petechial blood products in the subcortical left frontal lobe. Other dense cortical areas are likely indicative of laminar necrosis. Right hemisphere is normal. No midline shift or other mass effect. Vascular: Mild carotid atherosclerosis Skull: Normal Sinuses/Orbits: Clear sinuses.  Normal orbits. Other: None IMPRESSION: 1. No acute intracranial abnormality. 2. Progression of encephalomalacia at the site of recent left MCA territory infarct. Subacute to chronic petechial blood products in the subcortical left frontal lobe. Electronically Signed   By: Ulyses Jarred M.D.   On: 09/30/2019 19:47   CT ABDOMEN PELVIS W CONTRAST  Result Date: 09/30/2019 CLINICAL DATA:  Gastrointestinal hemorrhage EXAM: CT ABDOMEN AND PELVIS WITH CONTRAST TECHNIQUE: Multidetector CT imaging of the abdomen and pelvis was performed using the standard protocol following bolus administration of intravenous contrast. CONTRAST:  125mL OMNIPAQUE IOHEXOL 300 MG/ML  SOLN COMPARISON:  None. FINDINGS: Lower chest: Mild bibasilar atelectasis. Left ventricular hypertrophy. Mild resultant global cardiomegaly. Hepatobiliary: Liver unremarkable.  Gallbladder unremarkable. Pancreas: Unremarkable. Spleen: Status  post splenectomy. Tiny residual splenules noted within the left subdiaphragmatic region. Adrenals/Urinary Tract: Adrenal glands are unremarkable. Multiple simple cortical cysts are seen within the kidneys bilaterally. The kidneys are otherwise unremarkable. The bladder is unremarkable. Stomach/Bowel: The stomach is unremarkable. The small bowel is unremarkable. Moderate stool seen throughout the colon and rectal  vault, without evidence of obstruction. The large bowel is otherwise unremarkable. Appendix normal. No free intraperitoneal gas or fluid. Vascular/Lymphatic: Retroaortic left renal vein. Moderate atherosclerotic calcification within the abdominal aorta without evidence of aneurysm. No pathologic abdominal or pelvic adenopathy. Reproductive: Prostate gland and seminal vesicles are unremarkable. Other: Rectum unremarkable.  Tiny fat containing umbilical hernia Musculoskeletal: No acute bone abnormality. Degenerative changes are seen within the lumbar spine. Multiple healed left rib fractures are noted. Multiple healed right rib fractures are noted. IMPRESSION: No definite source identified for the patient's reported gastrointestinal hemorrhage. Incidental findings as noted above. Aortic Atherosclerosis (ICD10-I70.0). Electronically Signed   By: Fidela Salisbury MD   On: 09/30/2019 20:10   DG Chest Port 1 View  Result Date: 09/30/2019 CLINICAL DATA:  Acute encephalopathy. EXAM: PORTABLE CHEST 1 VIEW COMPARISON:  August 17, 2019. FINDINGS: Stable cardiomegaly. No pneumothorax or pleural effusion is noted. Decreased bibasilar opacities are noted suggesting improving atelectasis or pneumonia. Old left rib fractures are noted. IMPRESSION: Decreased bibasilar opacities are noted suggesting improving atelectasis or pneumonia. Electronically Signed   By: Marijo Conception M.D.   On: 09/30/2019 08:12   Scheduled Meds: . acetaminophen  650 mg Oral Q8H  . amLODipine  10 mg Oral Daily  . atorvastatin  80 mg Oral Daily  . folic acid  1 mg Oral Daily  . insulin aspart  0-9 Units Subcutaneous TID WC  . metoprolol tartrate  25 mg Oral BID  . pantoprazole  40 mg Oral Q0600  . thiamine  100 mg Oral Daily   Continuous Infusions: PRN Meds:.hydrALAZINE, LORazepam **OR** LORazepam, Resource ThickenUp Clear   ASSESMENT:   *  Anemia.  FOBT +.  Hx chronic microcytic anemia.  Iron deficient per labs of 08/23/19.  PTA oral iron,  currently on hold to avoid mistaking black stool for melena.     Hgb 6.4 >> 1 PRBC >> 7.9 >> 9.8.   Feraheme 7/21 CTAP w contrast: no soource for hemorrhage or anemia.    *   Chronic Plavix.  Last dose 7/20.   *  Leukocytosis. CXR today w improvement atx and PNA.  No abx in use.    *   Encephalopathy.    *   Dysphagia.  BS swallow eval, mild aspiration risk, cleared for D1, nectar thicks.    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      PLAN   *   Timing of EGD/Enteroscopy per Dr Henrene Pastor.  Will reach Plavix 5 d hold on 7/25.      Azucena Freed  10/01/2019, 11:13 AM Phone [phone]  GI ATTENDING  Interval history and data reviewed.  Patient personally seen and examined.  Patient is stable.  CT scan did not show any significant GI luminal pathology.  Hemoglobin stable at 9.8.  He did receive Feraheme.  On p.o. PPI.  Plans for upper endoscopy tomorrow to evaluate anemia and Hemoccult-positive stool.  Patient's last dose of Plavix was 3 days ago.  Patient is high risk given his comorbidities.The nature of the procedure, as well as the risks, benefits, and alternatives were carefully and thoroughly reviewed  with the patient. Ample time for discussion and questions allowed. The patient understood, was satisfied, and agreed to proceed.  Docia Chuck. Geri Seminole., M.D. [facility] Division of Gastroenterology

## 2019-10-02 ENCOUNTER — Encounter (HOSPITAL_COMMUNITY): Payer: Self-pay | Admitting: Family Medicine

## 2019-10-02 ENCOUNTER — Inpatient Hospital Stay (HOSPITAL_COMMUNITY): Payer: Medicare Other | Admitting: Certified Registered Nurse Anesthetist

## 2019-10-02 ENCOUNTER — Encounter (HOSPITAL_COMMUNITY)
Admission: EM | Disposition: A | Discharge status: Skilled Nursing Facility | Payer: Medicare Other | Source: Skilled Nursing Facility | Attending: Internal Medicine

## 2019-10-02 DIAGNOSIS — I5032 Chronic diastolic (congestive) heart failure: Secondary | ICD-10-CM | POA: Diagnosis not present

## 2019-10-02 DIAGNOSIS — N1831 Chronic kidney disease, stage 3a: Secondary | ICD-10-CM | POA: Diagnosis not present

## 2019-10-02 DIAGNOSIS — D649 Anemia, unspecified: Secondary | ICD-10-CM | POA: Diagnosis not present

## 2019-10-02 DIAGNOSIS — F1010 Alcohol abuse, uncomplicated: Secondary | ICD-10-CM | POA: Diagnosis not present

## 2019-10-02 HISTORY — PX: ESOPHAGOGASTRODUODENOSCOPY (EGD) WITH PROPOFOL: SHX5813

## 2019-10-02 LAB — BASIC METABOLIC PANEL
Anion gap: 12 (ref 5–15)
BUN: 15 mg/dL (ref 8–23)
CO2: 21 mmol/L — ABNORMAL LOW (ref 22–32)
Calcium: 9.4 mg/dL (ref 8.9–10.3)
Chloride: 107 mmol/L (ref 98–111)
Creatinine, Ser: 1.32 mg/dL — ABNORMAL HIGH (ref 0.61–1.24)
GFR calc Af Amer: >60 mL/min (ref >60)
GFR calc non Af Amer: 52 mL/min — ABNORMAL LOW (ref >60)
Glucose, Bld: 125 mg/dL — ABNORMAL HIGH (ref 70–99)
Potassium: 3.5 mmol/L (ref 3.5–5.1)
Sodium: 140 mmol/L (ref 135–145)

## 2019-10-02 LAB — GLUCOSE, CAPILLARY
Glucose-Capillary: 125 mg/dL — ABNORMAL HIGH (ref 70–99)
Glucose-Capillary: 125 mg/dL — ABNORMAL HIGH (ref 70–99)
Glucose-Capillary: 119 mg/dL — ABNORMAL HIGH (ref 70–99)
Glucose-Capillary: 121 mg/dL — ABNORMAL HIGH (ref 70–99)
Glucose-Capillary: 174 mg/dL — ABNORMAL HIGH (ref 70–99)

## 2019-10-02 LAB — CBC WITH DIFFERENTIAL/PLATELET
Abs Immature Granulocytes: 0.05 10*3/uL (ref 0.00–0.07)
Basophils Absolute: 0.1 10*3/uL (ref 0.0–0.1)
Basophils Relative: 1 %
Eosinophils Absolute: 0.4 10*3/uL (ref 0.0–0.5)
Eosinophils Relative: 3 %
HCT: 29.3 % — ABNORMAL LOW (ref 39.0–52.0)
Hemoglobin: 9 g/dL — ABNORMAL LOW (ref 13.0–17.0)
Immature Granulocytes: 0 %
Lymphocytes Relative: 23 %
Lymphs Abs: 2.9 10*3/uL (ref 0.7–4.0)
MCH: 23.6 pg — ABNORMAL LOW (ref 26.0–34.0)
MCHC: 30.7 g/dL (ref 30.0–36.0)
MCV: 76.9 fL — ABNORMAL LOW (ref 80.0–100.0)
Monocytes Absolute: 1.3 10*3/uL — ABNORMAL HIGH (ref 0.1–1.0)
Monocytes Relative: 10 %
Neutro Abs: 8 10*3/uL — ABNORMAL HIGH (ref 1.7–7.7)
Neutrophils Relative %: 63 %
Platelets: 602 10*3/uL — ABNORMAL HIGH (ref 150–400)
RBC: 3.81 MIL/uL — ABNORMAL LOW (ref 4.22–5.81)
RDW: 23.6 % — ABNORMAL HIGH (ref 11.5–15.5)
WBC: 12.4 10*3/uL — ABNORMAL HIGH (ref 4.0–10.5)
nRBC: 0.3 % — ABNORMAL HIGH (ref 0.0–0.2)

## 2019-10-02 SURGERY — ESOPHAGOGASTRODUODENOSCOPY (EGD) WITH PROPOFOL
Anesthesia: Monitor Anesthesia Care

## 2019-10-02 MED ORDER — LIDOCAINE 2% (20 MG/ML) 5 ML SYRINGE
INTRAMUSCULAR | Status: DC | PRN
  Administered 2019-10-02: 40 mg via INTRAVENOUS

## 2019-10-02 MED ORDER — ENOXAPARIN SODIUM 40 MG/0.4ML ~~LOC~~ SOLN
40.0000 mg | SUBCUTANEOUS | Status: DC
  Administered 2019-10-02 – 2019-10-03 (×2): 40 mg via SUBCUTANEOUS
  Filled 2019-10-02 (×3): qty 0.4

## 2019-10-02 MED ORDER — FERROUS SULFATE 325 (65 FE) MG PO TABS
325.0000 mg | ORAL_TABLET | Freq: Two times a day (BID) | ORAL | Status: DC
  Administered 2019-10-02 – 2019-10-07 (×10): 325 mg via ORAL
  Filled 2019-10-02 (×10): qty 1

## 2019-10-02 MED ORDER — LACTATED RINGERS IV SOLN: INTRAVENOUS | Status: DC | PRN

## 2019-10-02 MED ORDER — ASPIRIN EC 325 MG PO TBEC: 325.0000 mg | DELAYED_RELEASE_TABLET | Freq: Every day | ORAL | Status: DC

## 2019-10-02 MED ORDER — PROPOFOL 10 MG/ML IV BOLUS
INTRAVENOUS | Status: DC | PRN
  Administered 2019-10-02 (×2): 20 mg via INTRAVENOUS

## 2019-10-02 MED ORDER — CLOPIDOGREL BISULFATE 75 MG PO TABS: 75.0000 mg | ORAL_TABLET | Freq: Every day | ORAL | Status: DC

## 2019-10-02 MED ORDER — ASPIRIN EC 325 MG PO TBEC
325.0000 mg | DELAYED_RELEASE_TABLET | Freq: Every day | ORAL | Status: DC
  Administered 2019-10-02 – 2019-10-07 (×6): 325 mg via ORAL
  Filled 2019-10-02 (×6): qty 1

## 2019-10-02 MED ORDER — PROPOFOL 500 MG/50ML IV EMUL
INTRAVENOUS | Status: DC | PRN
  Administered 2019-10-02: 100 ug/kg/min via INTRAVENOUS

## 2019-10-02 MED ORDER — CLOPIDOGREL BISULFATE 75 MG PO TABS
75.0000 mg | ORAL_TABLET | Freq: Every day | ORAL | Status: DC
  Administered 2019-10-02 – 2019-10-06 (×5): 75 mg via ORAL
  Filled 2019-10-02 (×5): qty 1

## 2019-10-02 SURGICAL SUPPLY — 15 items

## 2019-10-02 NOTE — Anesthesia Preprocedure Evaluation (Addendum)
Anesthesia Evaluation  Patient identified by MRN, date of birth, ID bandGeneral Assessment Comment:Awake, but non-verbal  Reviewed: Allergy & Precautions, NPO status , Patient's Chart, lab work & pertinent test results  Airway Mallampati: III  TM Distance: >3 FB Neck ROM: Full    Dental  (+) Missing Patient unable to open mouth to command:   Pulmonary sleep apnea , former smoker,    Pulmonary exam normal breath sounds clear to auscultation       Cardiovascular hypertension, Pt. on medications and Pt. on home beta blockers +CHF  Normal cardiovascular exam+ Valvular Problems/Murmurs MR  Rhythm:Regular Rate:Normal  ECG: SR  ECHO: 1. Left ventricular ejection fraction, by estimation, is 50 to 55%. The left ventricle has low normal function. The left ventricle has no regional wall motion abnormalities. There is severe left ventricular hypertrophy. Left ventricular diastolic parameters are consistent with Grade II diastolic dysfunction (pseudonormalization). 2. Right ventricular systolic function is mildly reduced. The right ventricular size is mildly enlarged. There is severely elevated pulmonary artery systolic pressure. The estimated right ventricular systolic pressure is 28.7 mmHg. 3. Left atrial size was mildly dilated. 4. Right atrial size was mildly dilated. 5. The mitral valve is normal in structure. Mild mitral valve regurgitation. No evidence of mitral stenosis. 6. The aortic valve is tricuspid. Aortic valve regurgitation is not visualized. Mild aortic valve sclerosis is present, with no evidence of aortic valve stenosis. 7. The inferior vena cava is dilated in size with <50% respiratory variability, suggesting right atrial pressure of 15 mmHg.   Neuro/Psych PSYCHIATRIC DISORDERS TBI (traumatic brain injury)  Neuropathy CVA    GI/Hepatic negative GI ROS, Neg liver ROS,   Endo/Other  diabetes, Insulin Dependent, Oral  Hypoglycemic Agents  Renal/GU negative Renal ROS     Musculoskeletal negative musculoskeletal ROS (+)   Abdominal   Peds  Hematology  (+) anemia , HLD   Anesthesia Other Findings Anemia   FOBT positive  Reproductive/Obstetrics                           Anesthesia Physical Anesthesia Plan  ASA: III  Anesthesia Plan: MAC   Post-op Pain Management:    Induction: Intravenous  PONV Risk Score and Plan: 1 and Propofol infusion and Treatment may vary due to age or medical condition  Airway Management Planned: Nasal Cannula  Additional Equipment:   Intra-op Plan:   Post-operative Plan:   Informed Consent: I have reviewed the patients History and Physical, chart, labs and discussed the procedure including the risks, benefits and alternatives for the proposed anesthesia with the patient or authorized representative who has indicated his/her understanding and acceptance.     Consent reviewed with POA  Plan Discussed with: CRNA  Anesthesia Plan Comments: (Anesthetic plan discussed with son via telephone)      Anesthesia Quick Evaluation

## 2019-10-02 NOTE — TOC Initial Note (Signed)
Transition of Care [facility]) - Initial/Assessment Note    Patient Details  Name: Malik [name] MRN: [id number] Date of Birth: 01-09-1946  [facility] of [facility]) CM/SW Contact:    Bary Castilla, LCSW Phone Number: [phone] 10/02/2019, 4:28 PM  Clinical Narrative:                  CSW spoke with patient's son Azarel Banner who indicated the plan is for patient to return to [facility]. CSW reached out to [facility] to ascertain return to SNF; awaiting call back.   TOC team will continue to assist with discharge planning needs.   Expected Discharge Plan: Skilled Nursing Facility Barriers to Discharge: Continued Medical Work up, Ship broker   Patient Goals and CMS Choice        Expected Discharge Plan and Services Expected Discharge Plan: [place]       Living arrangements for the past 2 months: [place]                                      Prior Living Arrangements/Services Living arrangements for the past 2 months: [place] Lives with:: Facility Resident            Care giver support system in place?: Yes (comment)      Activities of Daily Living      Permission Sought/Granted      Share Information with NAME: Ianmichael Amescua (son)  Permission granted to share info w AGENCY: [facility]  Permission granted to share info w Relationship: Son  Permission granted to share info w Contact Information: [phone]  Emotional Assessment   Attitude/Demeanor/Rapport: Unable to Assess Affect (typically observed): Unable to Assess Orientation: : Oriented to Self      Admission diagnosis:  Gastrointestinal hemorrhage, unspecified gastrointestinal hemorrhage type [K92.2] Anemia, unspecified type [D64.9] Acute anemia [D64.9] Patient Active Problem List   Diagnosis Date Noted  . Acute anemia 09/28/2019  . History of CVA (cerebrovascular accident) 09/28/2019  .  Hyperlipidemia LDL goal <70 08/24/2019  . Dysphagia due to recent cerebral infarction 08/24/2019  . Severe protein-calorie malnutrition ([place]) 08/24/2019  . Iron deficiency anemia 08/24/2019  . AKI (acute kidney injury) ([place]) 08/24/2019  . CKD (chronic kidney disease), stage IIIa 08/24/2019  . Alcohol abuse 08/24/2019  . Obesity 08/24/2019  . Multinodular thyroid 08/24/2019  . Pressure injury of skin 08/13/2019  . Embolic stroke ([place]) L MCA s/p tPA, unknown source 08/10/2019  . Thrombocytosis ([place])   . Acute lower UTI   . Labile blood pressure   . Hydrothorax   . Sleep disturbance   . Pleural effusion on left   . Hypertensive crisis   . Hypertension   . Leukocytosis   . Benign essential HTN   . Diabetes mellitus type 2 in nonobese (HCC)   . Acute blood loss anemia   . Trauma 05/27/2017  . TBI (traumatic brain injury) ([place])   . Essential hypertension   . Chronic diastolic congestive heart failure ([place])   . 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 Rib fractures 05/10/2017  . 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   PCP:  [facility], Rise Patience, NP Pharmacy:   [id number] - [place], [place] - [address] [place] [address] Phone: [phone] Fax: [phone]     Social Determinants of Health (SDOH) Interventions  Readmission Risk Interventions No flowsheet data found.

## 2019-10-02 NOTE — Progress Notes (Signed)
PROGRESS NOTE  Malik [name] HMC:[id number] DOB: [birth year] DOA: 09/28/2019 PCP: [facility], [place], NP  HPI/Recap of past 24 hours: HPI from Dr Simon Rhein is a 74 y.o. male with medical history significant for TBI with aphasia, recent CVA with right upper extremity hemiplegia on dual antiplatelet therapy, history of iron deficiency anemia, type 2 diabetes, CKD stage IIIa, hypertension, OSA, hyperlipidemia and alcohol abuse who presents with concerns of acute anemia. Patient received lab work at his nursing facility and was noted to have hemoglobin of 6.4 with baseline hemoglobin of 7-8. Of note, pt was started on Plavix and aspirin in June due to a new left MCA infarct and also started on oral iron supplement for iron deficiency anemia.  Patient found to be Hemoccult positive during this admission.  He follows with Dr. Clide Deutscher at [facility] [place].  Reportedly had heme positive stool back in 2019 without any intervention.  Unsure of last colonoscopy. In the ED, he was afebrile and hypertensive with systolic up to 662H.  Initially his hemoglobin came back around 9 but on repeat it was 6.4 and patient and his son agreed to have 1 unit of PRBC transfusion. Creatinine and BUN normal.   Patient admitted for further management.     Today, patient bedside resting in bed, follows command.      Assessment/Plan: Principal Problem:   Acute anemia Active Problems:   Essential hypertension   Chronic diastolic congestive heart failure (HCC)   Diabetes mellitus type 2 in nonobese (HCC)   CKD (chronic kidney disease), stage IIIa   Alcohol abuse   History of CVA (cerebrovascular accident)   ??Possible GI bleed Acute on chronic anemia Hemoglobin 6.4 on admission, baseline around 7-8 FOBT positive CT abdomen/pelvis with no definitive source identified for bleeding S/p transfusion of 1 unit of PRBC S/p IV Feraheme, continue oral iron supplementation when able On  Plavix, aspirin PTA, restart as per GI Continue PPI GI consulted, s/p endoscopy on 10/02/2019 which showed angiodysplasia of the small bowel without bleeding Daily CBC  Acute metabolic encephalopathy Improving Unknown etiology Afebrile with leukocytosis (noted to be chronic) Urine culture, suggest recollection Chest x-ray showed decreased bibasilar opacities suggesting improving atelectasis or pneumonia CT head without contrast no acute intracranial abnormality Fall precautions  History of CVA/TBI/HLD Noted to be aphasic at baseline, with right upper extremity hemiplegia Recent stroke in June/2021 Restart aspirin and Plavix for now due to anemia Continue Lipitor Telemetry  Chronic diastolic HF/pulmonary hypertension Does not appear overloaded Echo done on 08/2019 showed EF of 50 to 55%, no regional wall motion abnormality, grade 2 diastolic dysfunction Monitor closely   CKD stage IIIa Creatinine stable Daily BMP  Hypertension Continue home amlodipine, metoprolol IV hydralazine as needed  Diabetes mellitus type 2 Last A1c 5.4 SSI, Accu-Cheks, hypoglycemic protocol Hold home Metformin  Thrombocytosis Chronic  Possibly due to anemia Daily CBC  Alcohol abuse Alcohol level less than 10 CIWA protocol  Severe dysphagia Seen by SLP in 08/2019, recommend dysphagia 1 diet and nectar thick liquid SLP consulted to reevaluate       Malnutrition Type:      Malnutrition Characteristics:      Nutrition Interventions:       Estimated body mass index is 28.85 kg/m as calculated from the following:   Height as of this encounter: 5\' 10"  (1.778 m).   Weight as of this encounter: 91.2 kg.     Code Status: Full  Family Communication: None at bedside  Disposition Plan: Status is: Inpatient    Dispo: The patient is from: SNF              Anticipated d/c is to: SNF              Anticipated d/c date is: 2 days              Patient currently is not medically  stable to d/c.     Consultants:  GI  Procedures:  EGD on 10/02/2019  Antimicrobials:  None  DVT prophylaxis: Lovenox   Objective: Vitals:   10/02/19 1145 10/02/19 1155 10/02/19 1216 10/02/19 1639  BP: (!) 167/89 (!) 188/87 (!) 172/90 (!) 155/80  Pulse:   75 81  Resp: 18 19 18 18   Temp:   (!) 97.3 F (36.3 C) 98 F (36.7 C)  TempSrc:   Axillary Axillary  SpO2: 100% 98% 100% 100%  Weight:      Height:        Intake/Output Summary (Last 24 hours) at 10/02/2019 1719 Last data filed at 10/02/2019 1510 Gross per 24 hour  Intake 570 ml  Output 575 ml  Net -5 ml   Filed Weights   10/01/19 0050 10/02/19 0027 10/02/19 1036  Weight: (!) 93 kg 91.2 kg 91.2 kg    Exam:  General: NAD, aphasic  Cardiovascular: S1, S2 present  Respiratory: CTAB  Abdomen: Soft, nontender, nondistended, bowel sounds present  Musculoskeletal: No bilateral pedal edema noted  Skin: Normal  Psychiatry:  Unable to assess  Neurology: Unable to assess    Data Reviewed: CBC: Recent Labs  Lab 09/28/19 2115 09/28/19 2115 09/28/19 2139 09/29/19 0602 09/30/19 0518 10/01/19 0722 10/02/19 0728  WBC 9.9  --   --  11.1* 13.3* 13.6* 12.4*  NEUTROABS 5.2  --   --  6.5 9.2* 9.1* 8.0*  HGB 6.4*   < > 9.5* 7.5* 7.9* 9.8* 9.0*  HCT 21.8*   < > 28.0* 25.5* 25.4* 31.5* 29.3*  MCV 76.8*  --   --  76.1* 74.9* 76.5* 76.9*  PLT 517*  --   --  442* 568* 637* 602*   < > = values in this interval not displayed.   Basic Metabolic Panel: Recent Labs  Lab 09/28/19 1906 09/28/19 1906 09/28/19 2139 09/29/19 0602 09/30/19 0518 10/01/19 0722 10/02/19 0728  NA 139   < > 140 138 137 139 140  K 4.3   < > 3.9 4.2 4.0 3.9 3.5  CL 107   < > 104 106 105 106 107  CO2 23  --   --  24 22 21* 21*  GLUCOSE 113*   < > 118* 95 126* 111* 125*  BUN 13   < > 14 10 12 10 15   CREATININE 1.01   < > 1.00 1.05 0.92 1.20 1.32*  CALCIUM 9.2  --   --  9.1 9.6 9.8 9.4   < > = values in this interval not  displayed.   GFR: Estimated Creatinine Clearance: 54.9 mL/min (A) (by C-G formula based on SCr of 1.32 mg/dL (H)). Liver Function Tests: Recent Labs  Lab 09/28/19 1906  AST 26  ALT 17  ALKPHOS 79  BILITOT 0.4  PROT 7.7  ALBUMIN 3.4*   No results for input(s): LIPASE, AMYLASE in the last 168 hours. No results for input(s): AMMONIA in the last 168 hours. Coagulation Profile: No results for input(s): INR, PROTIME in the last 168 hours. Cardiac Enzymes: No results for input(s): CKTOTAL, CKMB,  CKMBINDEX, TROPONINI in the last 168 hours. BNP (last 3 results) No results for input(s): PROBNP in the last 8760 hours. HbA1C: No results for input(s): HGBA1C in the last 72 hours. CBG: Recent Labs  Lab 10/01/19 2113 10/02/19 0603 10/02/19 0747 10/02/19 1212 10/02/19 1637  GLUCAP 113* 125* 125* 119* 121*   Lipid Profile: No results for input(s): CHOL, HDL, LDLCALC, TRIG, CHOLHDL, LDLDIRECT in the last 72 hours. Thyroid Function Tests: No results for input(s): TSH, T4TOTAL, FREET4, T3FREE, THYROIDAB in the last 72 hours. Anemia Panel: No results for input(s): VITAMINB12, FOLATE, FERRITIN, TIBC, IRON, RETICCTPCT in the last 72 hours. Urine analysis:    Component Value Date/Time   COLORURINE YELLOW 09/30/2019 1005   APPEARANCEUR CLEAR 09/30/2019 1005   APPEARANCEUR Clear 06/11/2011 2046   LABSPEC 1.014 09/30/2019 1005   LABSPEC 1.025 06/11/2011 2046   PHURINE 7.0 09/30/2019 1005   GLUCOSEU NEGATIVE 09/30/2019 1005   GLUCOSEU Negative 06/11/2011 2046   HGBUR NEGATIVE 09/30/2019 1005   BILIRUBINUR NEGATIVE 09/30/2019 1005   BILIRUBINUR Negative 06/11/2011 2046   [place] 09/30/2019 1005   PROTEINUR 30 (A) 09/30/2019 1005   NITRITE NEGATIVE 09/30/2019 1005   LEUKOCYTESUR NEGATIVE 09/30/2019 1005   LEUKOCYTESUR Negative 06/11/2011 2046   Sepsis Labs: @LABRCNTIP (procalcitonin:4,lacticidven:4)  ) Recent Results (from the past 240 hour(s))  SARS Coronavirus 2 by RT  PCR (hospital order, performed in [place] hospital lab) Nasopharyngeal Nasopharyngeal Swab     Status: None   Collection Time: 09/28/19  8:51 PM   Specimen: Nasopharyngeal Swab  Result Value Ref Range Status   SARS Coronavirus 2 NEGATIVE NEGATIVE Final    Comment: (NOTE) SARS-CoV-2 target nucleic acids are NOT DETECTED.  The SARS-CoV-2 RNA is generally detectable in upper and lower respiratory specimens during the acute phase of infection. The lowest concentration of SARS-CoV-2 viral copies this assay can detect is 250 copies / mL. A negative result does not preclude SARS-CoV-2 infection and should not be used as the sole basis for treatment or other patient management decisions.  A negative result may occur with improper specimen collection / handling, submission of specimen other than nasopharyngeal swab, presence of viral mutation(s) within the areas targeted by this assay, and inadequate number of viral copies (<250 copies / mL). A negative result must be combined with clinical observations, patient history, and epidemiological information.  Fact Sheet for Patients:   StrictlyIdeas.no  Fact Sheet for Healthcare Providers: BankingDealers.co.za  This test is not yet approved or  cleared by the [place] FDA and has been authorized for detection and/or diagnosis of SARS-CoV-2 by FDA under an Emergency Use Authorization (EUA).  This EUA will remain in effect (meaning this test can be used) for the duration of the COVID-19 declaration under Section 564(b)(1) of the Act, 21 U.S.C. section 360bbb-3(b)(1), unless the authorization is terminated or revoked sooner.  Performed at [facility], [place] [address], [place], [place] [address]   Culture, Urine     Status: Abnormal   Collection Time: 09/30/19 12:56 PM   Specimen: Urine, Random  Result Value Ref Range Status   Specimen Description URINE, RANDOM  Final   Special  Requests   Final    NONE Performed at [facility], [place] [address], [place], [place] [address]    Culture MULTIPLE SPECIES PRESENT, SUGGEST RECOLLECTION (A)  Final   Report Status 10/01/2019 FINAL  Final      Studies: No results found.  Scheduled Meds: . acetaminophen  650  mg Oral Q8H  . amLODipine  10 mg Oral Daily  . atorvastatin  80 mg Oral Daily  . ferrous sulfate  325 mg Oral BID WC  . folic acid  1 mg Oral Daily  . insulin aspart  0-9 Units Subcutaneous TID WC  . metoprolol tartrate  25 mg Oral BID  . pantoprazole  40 mg Oral Q0600  . thiamine  100 mg Oral Daily    Continuous Infusions:   LOS: 1 day     Alma Friendly, MD [facility]  If 7PM-7AM, please contact night-coverage [email or web address] 10/02/2019, 5:19 PM

## 2019-10-02 NOTE — Transfer of Care (Signed)
Immediate Anesthesia Transfer of Care Note  Patient: Zymere Patlan  Procedure(s) Performed: ESOPHAGOGASTRODUODENOSCOPY (EGD) WITH PROPOFOL (N/A )  Patient Location: Endoscopy Unit  Anesthesia Type:MAC  Level of Consciousness: drowsy  Airway & Oxygen Therapy: Patient Spontanous Breathing and Patient connected to nasal cannula oxygen  Post-op Assessment: Report given to RN and Post -op Vital signs reviewed and stable  Post vital signs: Reviewed and stable  Last Vitals:  Vitals Value Taken Time  BP 158/76 10/02/19 1134  Temp    Pulse 89 10/02/19 1135  Resp 25 10/02/19 1135  SpO2 100 % 10/02/19 1135  Vitals shown include unvalidated device data.  Last Pain:  Vitals:   10/02/19 1036  TempSrc: Axillary  PainSc:          Complications: No complications documented.

## 2019-10-02 NOTE — Op Note (Addendum)
[facility] Patient Name: Malik [name] Procedure Date : 10/02/2019 MRN: [id number] Attending MD: Docia Chuck. Henrene Pastor , MD Date of Birth: [birth year] CSN: [id number] Age: 74 Admit Type: Inpatient Procedure:                Upper GI endoscopy Indications:              Iron deficiency anemia, Heme positive stool Providers:                Docia Chuck. Henrene Pastor, MD, Clyde Lundborg, RN, Lazaro Arms,                            Technician Referring MD:             [facility] Medicines:                Monitored Anesthesia Care Complications:            No immediate complications. Estimated Blood Loss:     Estimated blood loss: none. Procedure:                Pre-Anesthesia Assessment:                           - Prior to the procedure, a History and Physical                            was performed, and patient medications and                            allergies were reviewed. The patient's tolerance of                            previous anesthesia was also reviewed. The risks                            and benefits of the procedure and the sedation                            options and risks were discussed with the patient.                            All questions were answered, and informed consent                            was obtained. Prior Anticoagulants: The patient has                            taken Plavix (clopidogrel), last dose was 4 days                            prior to procedure. ASA Grade Assessment: III - A                            patient with severe systemic disease. After  reviewing the risks and benefits, the patient was                            deemed in satisfactory condition to undergo the                            procedure.                           After obtaining informed consent, the endoscope was                            passed under direct vision. Throughout the                            procedure, the patient's  blood pressure, pulse, and                            oxygen saturations were monitored continuously. The                            GIF-H190 ([id number] Olympus gastroscope was                            introduced through the mouth, and advanced to the                            third part of duodenum. The upper GI endoscopy was                            accomplished without difficulty. The patient                            tolerated the procedure well. Scope In: Scope Out: Findings:      The esophagus was normal.      The stomach was normal. Small hiatal hernia noted.      A single small angiodysplastic lesion without bleeding was found in the       second portion of the duodenum.      The examined duodenum was normal to the third portion.      The cardia and gastric fundus were normal on retroflexion. Impression:               1. 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 This may very well be a cause for chronic                            microcytic anemia. Suspect more small bowel AVMs                            beyond the reach of the standard endoscope. Patient  has not had clinically significant bleeding (only                            occult). Recommendation:           1. Resume diet and medications                           2. Okay to resume Plavix                           3. This patient needs daily iron supplementation.                            Would recommend iron sulfate 324 mg twice daily,                            indefinitely                           4. Is supervising physician her PCP should monitor                            his blood counts periodically to assure good                            response to iron                           5. Ideally would like to perform colonoscopy, but                            deemed too high risk secondary to comorbidities                           I have  reviewed the findings with the patient and                            provided him a copy of his report. GI will sign                            off. Thank you. Procedure Code(s):        --- Professional ---                           [phone], Esophagogastroduodenoscopy, flexible,                            transoral; diagnostic, including collection of                            specimen(s) by brushing or washing, when performed                            (separate procedure) Diagnosis Code(s):        --- Professional ---  K31.819, Angiodysplasia of stomach and duodenum                            without bleeding                           D50.9, Iron deficiency anemia, unspecified                           R19.5, Other fecal abnormalities CPT copyright 2019 American Medical Association. All rights reserved. The codes documented in this report are preliminary and upon coder review may  be revised to meet current compliance requirements. Docia Chuck. Henrene Pastor, MD 10/02/2019 11:40:32 AM This report has been signed electronically. Number of Addenda: 0

## 2019-10-02 NOTE — Anesthesia Procedure Notes (Signed)
Procedure Name: MAC Date/Time: 10/02/2019 11:18 AM Performed by: Colin Benton, CRNA Pre-anesthesia Checklist: Patient identified, Emergency Drugs available, Suction available and Patient being monitored Patient Re-evaluated:Patient Re-evaluated prior to induction Oxygen Delivery Method: Nasal cannula Induction Type: IV induction Placement Confirmation: positive ETCO2 Dental Injury: Teeth and Oropharynx as per pre-operative assessment

## 2019-10-02 NOTE — Anesthesia Postprocedure Evaluation (Signed)
Anesthesia Post Note  Patient: Malik [name]  Procedure(s) Performed: ESOPHAGOGASTRODUODENOSCOPY (EGD) WITH PROPOFOL (N/A )     Patient location during evaluation: Endoscopy Anesthesia Type: MAC Level of consciousness: awake Pain management: pain level controlled Vital Signs Assessment: post-procedure vital signs reviewed and stable Respiratory status: spontaneous breathing, nonlabored ventilation, respiratory function stable and patient connected to nasal cannula oxygen Cardiovascular status: stable and blood pressure returned to baseline Postop Assessment: no apparent nausea or vomiting Anesthetic complications: no   No complications documented.  Last Vitals:  Vitals:   10/02/19 1155 10/02/19 1216  BP: (!) 188/87 (!) 172/90  Pulse:  75  Resp: 19 18  Temp:  (!) 36.3 C  SpO2: 98% 100%    Last Pain:  Vitals:   10/02/19 1216  TempSrc: Axillary  PainSc:                  Karyl Kinnier Kenn Rekowski

## 2019-10-02 NOTE — Interval H&P Note (Signed)
History and Physical Interval Note:  10/02/2019 11:11 AM  Malik [name]  has presented today for surgery, with the diagnosis of Anemia.  FOBT positive..  The various methods of treatment have been discussed with the patient and family. After consideration of risks, benefits and other options for treatment, the patient has consented to  Procedure(s): ESOPHAGOGASTRODUODENOSCOPY (EGD) WITH PROPOFOL (N/A) as a surgical intervention.  The patient's history has been reviewed, patient examined, no change in status, stable for surgery.  I have reviewed the patient's chart and labs.  Questions were answered to the patient's satisfaction.     Scarlette Shorts

## 2019-10-03 DIAGNOSIS — N1831 Chronic kidney disease, stage 3a: Secondary | ICD-10-CM | POA: Diagnosis not present

## 2019-10-03 DIAGNOSIS — F1010 Alcohol abuse, uncomplicated: Secondary | ICD-10-CM | POA: Diagnosis not present

## 2019-10-03 DIAGNOSIS — I5032 Chronic diastolic (congestive) heart failure: Secondary | ICD-10-CM | POA: Diagnosis not present

## 2019-10-03 DIAGNOSIS — D649 Anemia, unspecified: Secondary | ICD-10-CM | POA: Diagnosis not present

## 2019-10-03 LAB — GLUCOSE, CAPILLARY
Glucose-Capillary: 89 mg/dL (ref 70–99)
Glucose-Capillary: 119 mg/dL — ABNORMAL HIGH (ref 70–99)
Glucose-Capillary: 150 mg/dL — ABNORMAL HIGH (ref 70–99)
Glucose-Capillary: 164 mg/dL — ABNORMAL HIGH (ref 70–99)

## 2019-10-03 LAB — CBC WITH DIFFERENTIAL/PLATELET
Abs Immature Granulocytes: 0.04 10*3/uL (ref 0.00–0.07)
Basophils Absolute: 0.1 10*3/uL (ref 0.0–0.1)
Basophils Relative: 1 %
Eosinophils Absolute: 0.5 10*3/uL (ref 0.0–0.5)
Eosinophils Relative: 4 %
HCT: 30.1 % — ABNORMAL LOW (ref 39.0–52.0)
Hemoglobin: 9.1 g/dL — ABNORMAL LOW (ref 13.0–17.0)
Immature Granulocytes: 0 %
Lymphocytes Relative: 20 %
Lymphs Abs: 2.6 10*3/uL (ref 0.7–4.0)
MCH: 23.8 pg — ABNORMAL LOW (ref 26.0–34.0)
MCHC: 30.2 g/dL (ref 30.0–36.0)
MCV: 78.8 fL — ABNORMAL LOW (ref 80.0–100.0)
Monocytes Absolute: 1.2 10*3/uL — ABNORMAL HIGH (ref 0.1–1.0)
Monocytes Relative: 9 %
Neutro Abs: 8.7 10*3/uL — ABNORMAL HIGH (ref 1.7–7.7)
Neutrophils Relative %: 66 %
Platelets: 580 10*3/uL — ABNORMAL HIGH (ref 150–400)
RBC: 3.82 MIL/uL — ABNORMAL LOW (ref 4.22–5.81)
RDW: 24.5 % — ABNORMAL HIGH (ref 11.5–15.5)
WBC: 13.2 10*3/uL — ABNORMAL HIGH (ref 4.0–10.5)
nRBC: 0.4 % — ABNORMAL HIGH (ref 0.0–0.2)

## 2019-10-03 LAB — BASIC METABOLIC PANEL
Anion gap: 10 (ref 5–15)
BUN: 14 mg/dL (ref 8–23)
CO2: 21 mmol/L — ABNORMAL LOW (ref 22–32)
Calcium: 9.3 mg/dL (ref 8.9–10.3)
Chloride: 111 mmol/L (ref 98–111)
Creatinine, Ser: 1.29 mg/dL — ABNORMAL HIGH (ref 0.61–1.24)
GFR calc Af Amer: >60 mL/min (ref >60)
GFR calc non Af Amer: 54 mL/min — ABNORMAL LOW (ref >60)
Glucose, Bld: 97 mg/dL (ref 70–99)
Potassium: 4.1 mmol/L (ref 3.5–5.1)
Sodium: 142 mmol/L (ref 135–145)

## 2019-10-03 NOTE — Progress Notes (Signed)
PROGRESS NOTE  Malik [name] ZES:[id number] DOB: [birth year] DOA: 09/28/2019 PCP: [facility] [place], NP  HPI/Recap of past 24 hours: HPI from Dr Malik [name] is a 74 y.o. male with medical history significant for TBI with aphasia, recent CVA with right upper extremity hemiplegia on dual antiplatelet therapy, history of iron deficiency anemia, type 2 diabetes, CKD stage IIIa, hypertension, OSA, hyperlipidemia and alcohol abuse who presents with concerns of acute anemia. Patient received lab work at his nursing facility and was noted to have hemoglobin of 6.4 with baseline hemoglobin of 7-8. Of note, pt was started on Plavix and aspirin in June due to a new left MCA infarct and also started on oral iron supplement for iron deficiency anemia.  Patient found to be Hemoccult positive during this admission.  He follows with Dr. Clide Deutscher at [facility].  Reportedly had heme positive stool back in 2019 without any intervention.  Unsure of last colonoscopy. In the ED, he was afebrile and hypertensive with systolic up to 263F.  Initially his hemoglobin came back around 9 but on repeat it was 6.4 and patient and his son agreed to have 1 unit of PRBC transfusion. Creatinine and BUN normal.   Patient admitted for further management.     Today, saw patient at bedside, awake, alert, does have aphasia but refusing to follow commands     Assessment/Plan: Principal Problem:   Acute anemia Active Problems:   Essential hypertension   Chronic diastolic congestive heart failure ([place])   Diabetes mellitus type 2 in nonobese ([place])   CKD (chronic kidney disease), stage IIIa   Alcohol abuse   History of CVA (cerebrovascular accident)   ??Possible GI bleed Acute on chronic anemia Hemoglobin 6.4 on admission, baseline around 7-8 FOBT positive CT abdomen/pelvis with no definitive source identified for bleeding S/p transfusion of 1 unit of PRBC S/p IV Feraheme, continue oral  iron supplementation when able On Plavix, aspirin PTA, restart as per GI Continue PPI GI consulted, s/p endoscopy on 10/02/2019 which showed angiodysplasia of the small bowel without bleeding Daily CBC  Acute metabolic encephalopathy Improving Unknown etiology Afebrile with leukocytosis (noted to be chronic) Urine culture, suggest recollection Chest x-ray showed decreased bibasilar opacities suggesting improving atelectasis or pneumonia CT head without contrast no acute intracranial abnormality Fall precautions  History of CVA/TBI/HLD Noted to be aphasic at baseline, with right upper extremity hemiplegia Recent stroke in June/2021 Restart aspirin and Plavix for now due to anemia Continue Lipitor Telemetry  Chronic diastolic HF/pulmonary hypertension Does not appear overloaded Echo done on 08/2019 showed EF of 50 to 55%, no regional wall motion abnormality, grade 2 diastolic dysfunction Monitor closely   CKD stage IIIa Creatinine stable Daily BMP  Hypertension Continue home amlodipine, metoprolol IV hydralazine as needed  Diabetes mellitus type 2 Last A1c 5.4 SSI, Accu-Cheks, hypoglycemic protocol Hold home Metformin  Thrombocytosis Chronic  Possibly due to anemia Daily CBC  Alcohol abuse Alcohol level less than 10 CIWA protocol  Severe dysphagia Seen by SLP in 08/2019, recommend dysphagia 1 diet and nectar thick liquid SLP consulted to reevaluate       Malnutrition Type:      Malnutrition Characteristics:      Nutrition Interventions:       Estimated body mass index is 28.7 kg/m as calculated from the following:   Height as of this encounter: 5\' 10"  (1.778 m).   Weight as of this encounter: 90.7 kg.     Code Status: Full  Family Communication: None at bedside  Disposition Plan: Status is: Inpatient    Dispo: The patient is from: SNF              Anticipated d/c is to: SNF              Anticipated d/c date is: 2 days               Patient currently is not medically stable to d/c.     Consultants:  GI  Procedures:  EGD on 10/02/2019  Antimicrobials:  None  DVT prophylaxis: Lovenox   Objective: Vitals:   10/02/19 1949 10/03/19 0347 10/03/19 0945 10/03/19 1154  BP: (!) 162/80 (!) 157/80 (!) 130/83 (!) 129/69  Pulse: 87 77 94 70  Resp: 18 18 18 18   Temp: 98.6 F (37 C) 97.9 F (36.6 C)  98 F (36.7 C)  TempSrc: Oral Oral  Oral  SpO2: 97% 98% 98% 100%  Weight:  90.7 kg    Height:        Intake/Output Summary (Last 24 hours) at 10/03/2019 1751 Last data filed at 10/03/2019 1158 Gross per 24 hour  Intake 240 ml  Output 400 ml  Net -160 ml   Filed Weights   10/02/19 0027 10/02/19 1036 10/03/19 0347  Weight: 91.2 kg 91.2 kg 90.7 kg    Exam:  General: NAD, aphasic  Cardiovascular: S1, S2 present  Respiratory: CTAB  Abdomen: Soft, nontender, nondistended, bowel sounds present  Musculoskeletal: No bilateral pedal edema noted  Skin: Normal  Psychiatry:  Unable to assess  Neurology: Unable to assess    Data Reviewed: CBC: Recent Labs  Lab 09/29/19 0602 09/30/19 0518 10/01/19 0722 10/02/19 0728 10/03/19 0636  WBC 11.1* 13.3* 13.6* 12.4* 13.2*  NEUTROABS 6.5 9.2* 9.1* 8.0* 8.7*  HGB 7.5* 7.9* 9.8* 9.0* 9.1*  HCT 25.5* 25.4* 31.5* 29.3* 30.1*  MCV 76.1* 74.9* 76.5* 76.9* 78.8*  PLT 442* 568* 637* 602* 010*   Basic Metabolic Panel: Recent Labs  Lab 09/29/19 0602 09/30/19 0518 10/01/19 0722 10/02/19 0728 10/03/19 0636  NA 138 137 139 140 142  K 4.2 4.0 3.9 3.5 4.1  CL 106 105 106 107 111  CO2 24 22 21* 21* 21*  GLUCOSE 95 126* 111* 125* 97  BUN 10 12 10 15 14   CREATININE 1.05 0.92 1.20 1.32* 1.29*  CALCIUM 9.1 9.6 9.8 9.4 9.3   GFR: Estimated Creatinine Clearance: 56.1 mL/min (A) (by C-G formula based on SCr of 1.29 mg/dL (H)). Liver Function Tests: Recent Labs  Lab 09/28/19 1906  AST 26  ALT 17  ALKPHOS 79  BILITOT 0.4  PROT 7.7  ALBUMIN 3.4*   No  results for input(s): LIPASE, AMYLASE in the last 168 hours. No results for input(s): AMMONIA in the last 168 hours. Coagulation Profile: No results for input(s): INR, PROTIME in the last 168 hours. Cardiac Enzymes: No results for input(s): CKTOTAL, CKMB, CKMBINDEX, TROPONINI in the last 168 hours. BNP (last 3 results) No results for input(s): PROBNP in the last 8760 hours. HbA1C: No results for input(s): HGBA1C in the last 72 hours. CBG: Recent Labs  Lab 10/02/19 1637 10/02/19 2128 10/03/19 0610 10/03/19 1151 10/03/19 1648  GLUCAP 121* 174* 89 119* 150*   Lipid Profile: No results for input(s): CHOL, HDL, LDLCALC, TRIG, CHOLHDL, LDLDIRECT in the last 72 hours. Thyroid Function Tests: No results for input(s): TSH, T4TOTAL, FREET4, T3FREE, THYROIDAB in the last 72 hours. Anemia Panel: No results for input(s): VITAMINB12, FOLATE,  FERRITIN, TIBC, IRON, RETICCTPCT in the last 72 hours. Urine analysis:    Component Value Date/Time   COLORURINE YELLOW 09/30/2019 1005   APPEARANCEUR CLEAR 09/30/2019 1005   APPEARANCEUR Clear 06/11/2011 2046   LABSPEC 1.014 09/30/2019 1005   LABSPEC 1.025 06/11/2011 2046   PHURINE 7.0 09/30/2019 1005   GLUCOSEU NEGATIVE 09/30/2019 1005   GLUCOSEU Negative 06/11/2011 2046   HGBUR NEGATIVE 09/30/2019 1005   BILIRUBINUR NEGATIVE 09/30/2019 1005   BILIRUBINUR Negative 06/11/2011 2046   KETONESUR NEGATIVE 09/30/2019 1005   PROTEINUR 30 (A) 09/30/2019 1005   NITRITE NEGATIVE 09/30/2019 1005   LEUKOCYTESUR NEGATIVE 09/30/2019 1005   LEUKOCYTESUR Negative 06/11/2011 2046   Sepsis Labs: @LABRCNTIP (procalcitonin:4,lacticidven:4)  ) Recent Results (from the past 240 hour(s))  SARS Coronavirus 2 by RT PCR (hospital order, performed in [place] hospital lab) Nasopharyngeal Nasopharyngeal Swab     Status: None   Collection Time: 09/28/19  8:51 PM   Specimen: Nasopharyngeal Swab  Result Value Ref Range Status   SARS Coronavirus 2 NEGATIVE NEGATIVE  Final    Comment: (NOTE) SARS-CoV-2 target nucleic acids are NOT DETECTED.  The SARS-CoV-2 RNA is generally detectable in upper and lower respiratory specimens during the acute phase of infection. The lowest concentration of SARS-CoV-2 viral copies this assay can detect is 250 copies / mL. A negative result does not preclude SARS-CoV-2 infection and should not be used as the sole basis for treatment or other patient management decisions.  A negative result may occur with improper specimen collection / handling, submission of specimen other than nasopharyngeal swab, presence of viral mutation(s) within the areas targeted by this assay, and inadequate number of viral copies (<250 copies / mL). A negative result must be combined with clinical observations, patient history, and epidemiological information.  Fact Sheet for Patients:   StrictlyIdeas.no  Fact Sheet for Healthcare Providers: BankingDealers.co.za  This test is not yet approved or  cleared by the [place] FDA and has been authorized for detection and/or diagnosis of SARS-CoV-2 by FDA under an Emergency Use Authorization (EUA).  This EUA will remain in effect (meaning this test can be used) for the duration of the COVID-19 declaration under Section 564(b)(1) of the Act, 21 U.S.C. section 360bbb-3(b)(1), unless the authorization is terminated or revoked sooner.  Performed at [facility], [place] [address] [place], [place] [address]   Culture, Urine     Status: Abnormal   Collection Time: 09/30/19 12:56 PM   Specimen: Urine, Random  Result Value Ref Range Status   Specimen Description URINE, RANDOM  Final   Special Requests   Final    NONE Performed at [facility], [place] [address], [place], [place] [address]    Culture MULTIPLE SPECIES PRESENT, SUGGEST RECOLLECTION (A)  Final   Report Status 10/01/2019 FINAL  Final      Studies: No results  found.  Scheduled Meds: . acetaminophen  650 mg Oral Q8H  . amLODipine  10 mg Oral Daily  . aspirin  325 mg Oral Daily  . atorvastatin  80 mg Oral Daily  . clopidogrel  75 mg Oral Daily  . enoxaparin (LOVENOX) injection  40 mg Subcutaneous Q24H  . ferrous sulfate  325 mg Oral BID WC  . folic acid  1 mg Oral Daily  . insulin aspart  0-9 Units Subcutaneous TID WC  . metoprolol tartrate  25 mg Oral BID  . pantoprazole  40 mg Oral Q0600  . thiamine  100 mg Oral  Daily    Continuous Infusions:   LOS: 2 days     Alma Friendly, MD [facility]  If 7PM-7AM, please contact night-coverage [email or web address] 10/03/2019, 5:51 PM

## 2019-10-03 NOTE — Plan of Care (Signed)
°  Problem: Clinical Measurements: Goal: Will remain free from infection Outcome: Progressing Goal: Cardiovascular complication will be avoided Outcome: Progressing   Problem: Nutrition: Goal: Adequate nutrition will be maintained Outcome: Progressing   Problem: Elimination: Goal: Will not experience complications related to bowel motility Outcome: Progressing Goal: Will not experience complications related to urinary retention Outcome: Progressing   Problem: Pain Managment: Goal: General experience of comfort will improve Outcome: Progressing   Problem: Safety: Goal: Ability to remain free from injury will improve Outcome: Progressing

## 2019-10-04 DIAGNOSIS — F1010 Alcohol abuse, uncomplicated: Secondary | ICD-10-CM | POA: Diagnosis not present

## 2019-10-04 DIAGNOSIS — D649 Anemia, unspecified: Secondary | ICD-10-CM | POA: Diagnosis not present

## 2019-10-04 DIAGNOSIS — N1831 Chronic kidney disease, stage 3a: Secondary | ICD-10-CM | POA: Diagnosis not present

## 2019-10-04 DIAGNOSIS — I5032 Chronic diastolic (congestive) heart failure: Secondary | ICD-10-CM | POA: Diagnosis not present

## 2019-10-04 LAB — CBC WITH DIFFERENTIAL/PLATELET
Abs Immature Granulocytes: 0.05 10*3/uL (ref 0.00–0.07)
Basophils Absolute: 0.1 10*3/uL (ref 0.0–0.1)
Basophils Relative: 1 %
Eosinophils Absolute: 0.5 10*3/uL (ref 0.0–0.5)
Eosinophils Relative: 3 %
HCT: 30.5 % — ABNORMAL LOW (ref 39.0–52.0)
Hemoglobin: 9 g/dL — ABNORMAL LOW (ref 13.0–17.0)
Immature Granulocytes: 0 %
Lymphocytes Relative: 15 %
Lymphs Abs: 2.6 10*3/uL (ref 0.7–4.0)
MCH: 23.9 pg — ABNORMAL LOW (ref 26.0–34.0)
MCHC: 29.5 g/dL — ABNORMAL LOW (ref 30.0–36.0)
MCV: 80.9 fL (ref 80.0–100.0)
Monocytes Absolute: 1.4 10*3/uL — ABNORMAL HIGH (ref 0.1–1.0)
Monocytes Relative: 8 %
Neutro Abs: 12.2 10*3/uL — ABNORMAL HIGH (ref 1.7–7.7)
Neutrophils Relative %: 73 %
Platelets: 578 10*3/uL — ABNORMAL HIGH (ref 150–400)
RBC: 3.77 MIL/uL — ABNORMAL LOW (ref 4.22–5.81)
RDW: 25.4 % — ABNORMAL HIGH (ref 11.5–15.5)
WBC: 16.8 10*3/uL — ABNORMAL HIGH (ref 4.0–10.5)
nRBC: 0.3 % — ABNORMAL HIGH (ref 0.0–0.2)

## 2019-10-04 LAB — GLUCOSE, CAPILLARY
Glucose-Capillary: 95 mg/dL (ref 70–99)
Glucose-Capillary: 143 mg/dL — ABNORMAL HIGH (ref 70–99)
Glucose-Capillary: 144 mg/dL — ABNORMAL HIGH (ref 70–99)
Glucose-Capillary: 143 mg/dL — ABNORMAL HIGH (ref 70–99)

## 2019-10-04 LAB — BASIC METABOLIC PANEL
Anion gap: 9 (ref 5–15)
BUN: 15 mg/dL (ref 8–23)
CO2: 23 mmol/L (ref 22–32)
Calcium: 9.1 mg/dL (ref 8.9–10.3)
Chloride: 112 mmol/L — ABNORMAL HIGH (ref 98–111)
Creatinine, Ser: 1.31 mg/dL — ABNORMAL HIGH (ref 0.61–1.24)
GFR calc Af Amer: >60 mL/min (ref >60)
GFR calc non Af Amer: 53 mL/min — ABNORMAL LOW (ref >60)
Glucose, Bld: 105 mg/dL — ABNORMAL HIGH (ref 70–99)
Potassium: 4 mmol/L (ref 3.5–5.1)
Sodium: 144 mmol/L (ref 135–145)

## 2019-10-04 NOTE — Progress Notes (Addendum)
PROGRESS NOTE  Hermes Wafer PJS:[id number] DOB: 07/08/1945 DOA: 09/28/2019 PCP: [facility], [place], NP  HPI/Recap of past 24 hours: HPI from Dr Simon Rhein is a 74 y.o. male with medical history significant for TBI with aphasia, recent CVA with right upper extremity hemiplegia on dual antiplatelet therapy, history of iron deficiency anemia, type 2 diabetes, CKD stage IIIa, hypertension, OSA, hyperlipidemia and alcohol abuse who presents with concerns of acute anemia. Patient received lab work at his nursing facility and was noted to have hemoglobin of 6.4 with baseline hemoglobin of 7-8. Of note, pt was started on Plavix and aspirin in June due to a new left MCA infarct and also started on oral iron supplement for iron deficiency anemia.  Patient found to be Hemoccult positive during this admission.  He follows with Dr. Clide Deutscher at [facility] [place].  Reportedly had heme positive stool back in 2019 without any intervention.  Unsure of last colonoscopy. In the ED, he was afebrile and hypertensive with systolic up to 292K.  Initially his hemoglobin came back around 9 but on repeat it was 6.4 and patient and his son agreed to have 1 unit of PRBC transfusion. Creatinine and BUN normal.   Patient admitted for further management.    Today, patient awake, alert, not able to follow commands, significantly deconditioned.  Looks comfortable, noted chronic aphasia.     Assessment/Plan: Principal Problem:   Acute anemia Active Problems:   Essential hypertension   Chronic diastolic congestive heart failure (HCC)   Diabetes mellitus type 2 in nonobese (HCC)   CKD (chronic kidney disease), stage IIIa   Alcohol abuse   History of CVA (cerebrovascular accident)   ??Possible GI bleed Acute on chronic anemia Hemoglobin 6.4 on admission, baseline around 7-8 FOBT positive CT abdomen/pelvis with no definitive source identified for bleeding S/p transfusion of 1 unit of  PRBC S/p IV Feraheme, continue oral iron supplementation when able On Plavix, aspirin PTA, restart as per GI Continue PPI GI consulted, s/p endoscopy on 10/02/2019 which showed angiodysplasia of the small bowel without bleeding Daily CBC  Acute metabolic encephalopathy Stable Unknown etiology Afebrile with leukocytosis (noted to be chronic) Urine culture, suggest recollection Chest x-ray showed decreased bibasilar opacities suggesting improving atelectasis or pneumonia CT head without contrast no acute intracranial abnormality Fall precautions  History of CVA/TBI/HLD Noted to be aphasic at baseline, with right upper extremity hemiplegia Recent stroke in June/2021 Restart aspirin and Plavix for now due to anemia Continue Lipitor Telemetry  Chronic diastolic HF/pulmonary hypertension Does not appear overloaded Echo done on 08/2019 showed EF of 50 to 55%, no regional wall motion abnormality, grade 2 diastolic dysfunction Monitor closely   CKD stage IIIa Creatinine stable Daily BMP  Hypertension Continue home amlodipine, metoprolol IV hydralazine as needed  Diabetes mellitus type 2 Last A1c 5.4 SSI, Accu-Cheks, hypoglycemic protocol Hold home Metformin  Thrombocytosis Chronic  Possibly due to anemia Daily CBC  Alcohol abuse Alcohol level less than 10 CIWA protocol  Severe dysphagia Seen by SLP in 08/2019, recommend dysphagia 1 diet and nectar thick liquid SLP consulted to reevaluate  Goals of care discussion Noted very poor prognosis, multiple chronic issues Palliative consulted for goals of care discussion       Malnutrition Type:      Malnutrition Characteristics:      Nutrition Interventions:       Estimated body mass index is 29.41 kg/m as calculated from the following:   Height as of this encounter:  5\' 10"  (1.778 m).   Weight as of this encounter: 93 kg.     Code Status: Full  Family Communication: None at bedside  Disposition Plan:  Status is: Inpatient    Dispo: The patient is from: SNF              Anticipated d/c is to: SNF              Anticipated d/c date is: 2 days              Patient currently is not medically stable to d/c. Awaiting SNF     Consultants:  GI  Procedures:  EGD on 10/02/2019  Antimicrobials:  None  DVT prophylaxis: Lovenox   Objective: Vitals:   10/03/19 1154 10/03/19 2028 10/04/19 0427 10/04/19 1126  BP: (!) 129/69 (!) 130/68 (!) 146/58 (!) 159/78  Pulse: 70 90 77 73  Resp: 18 20 20 20   Temp: 98 F (36.7 C) 98.7 F (37.1 C) 98.3 F (36.8 C) 98.4 F (36.9 C)  TempSrc: Oral Oral Oral Oral  SpO2: 100% 100% 98% 99%  Weight:   (!) 93 kg   Height:        Intake/Output Summary (Last 24 hours) at 10/04/2019 1606 Last data filed at 10/04/2019 1350 Gross per 24 hour  Intake 720 ml  Output 300 ml  Net 420 ml   Filed Weights   10/02/19 1036 10/03/19 0347 10/04/19 0427  Weight: 91.2 kg 90.7 kg (!) 93 kg    Exam:  General: NAD, aphasic  Cardiovascular: S1, S2 present  Respiratory: CTAB  Abdomen: Soft, nontender, nondistended, bowel sounds present  Musculoskeletal: No bilateral pedal edema noted  Skin: Normal  Psychiatry:  Unable to assess  Neurology: Unable to assess    Data Reviewed: CBC: Recent Labs  Lab 09/30/19 0518 10/01/19 0722 10/02/19 0728 10/03/19 0636 10/04/19 0507  WBC 13.3* 13.6* 12.4* 13.2* 16.8*  NEUTROABS 9.2* 9.1* 8.0* 8.7* 12.2*  HGB 7.9* 9.8* 9.0* 9.1* 9.0*  HCT 25.4* 31.5* 29.3* 30.1* 30.5*  MCV 74.9* 76.5* 76.9* 78.8* 80.9  PLT 568* 637* 602* 580* 858*   Basic Metabolic Panel: Recent Labs  Lab 09/30/19 0518 10/01/19 0722 10/02/19 0728 10/03/19 0636 10/04/19 0507  NA 137 139 140 142 144  K 4.0 3.9 3.5 4.1 4.0  CL 105 106 107 111 112*  CO2 22 21* 21* 21* 23  GLUCOSE 126* 111* 125* 97 105*  BUN 12 10 15 14 15   CREATININE 0.92 1.20 1.32* 1.29* 1.31*  CALCIUM 9.6 9.8 9.4 9.3 9.1   GFR: Estimated Creatinine  Clearance: 55.8 mL/min (A) (by C-G formula based on SCr of 1.31 mg/dL (H)). Liver Function Tests: Recent Labs  Lab 09/28/19 1906  AST 26  ALT 17  ALKPHOS 79  BILITOT 0.4  PROT 7.7  ALBUMIN 3.4*   No results for input(s): LIPASE, AMYLASE in the last 168 hours. No results for input(s): AMMONIA in the last 168 hours. Coagulation Profile: No results for input(s): INR, PROTIME in the last 168 hours. Cardiac Enzymes: No results for input(s): CKTOTAL, CKMB, CKMBINDEX, TROPONINI in the last 168 hours. BNP (last 3 results) No results for input(s): PROBNP in the last 8760 hours. HbA1C: No results for input(s): HGBA1C in the last 72 hours. CBG: Recent Labs  Lab 10/03/19 1151 10/03/19 1648 10/03/19 2110 10/04/19 0616 10/04/19 1124  GLUCAP 119* 150* 164* 95 143*   Lipid Profile: No results for input(s): CHOL, HDL, LDLCALC, TRIG, CHOLHDL, LDLDIRECT in the  last 72 hours. Thyroid Function Tests: No results for input(s): TSH, T4TOTAL, FREET4, T3FREE, THYROIDAB in the last 72 hours. Anemia Panel: No results for input(s): VITAMINB12, FOLATE, FERRITIN, TIBC, IRON, RETICCTPCT in the last 72 hours. Urine analysis:    Component Value Date/Time   COLORURINE YELLOW 09/30/2019 1005   APPEARANCEUR CLEAR 09/30/2019 1005   APPEARANCEUR Clear 06/11/2011 2046   LABSPEC 1.014 09/30/2019 1005   LABSPEC 1.025 06/11/2011 2046   PHURINE 7.0 09/30/2019 1005   GLUCOSEU NEGATIVE 09/30/2019 1005   GLUCOSEU Negative 06/11/2011 2046   HGBUR NEGATIVE 09/30/2019 1005   BILIRUBINUR NEGATIVE 09/30/2019 1005   BILIRUBINUR Negative 06/11/2011 2046   [place] 09/30/2019 1005   PROTEINUR 30 (A) 09/30/2019 1005   NITRITE NEGATIVE 09/30/2019 1005   LEUKOCYTESUR NEGATIVE 09/30/2019 1005   LEUKOCYTESUR Negative 06/11/2011 2046   Sepsis Labs: @LABRCNTIP (procalcitonin:4,lacticidven:4)  ) Recent Results (from the past 240 hour(s))  SARS Coronavirus 2 by RT PCR (hospital order, performed in [place] hospital lab) Nasopharyngeal Nasopharyngeal Swab     Status: None   Collection Time: 09/28/19  8:51 PM   Specimen: Nasopharyngeal Swab  Result Value Ref Range Status   SARS Coronavirus 2 NEGATIVE NEGATIVE Final    Comment: (NOTE) SARS-CoV-2 target nucleic acids are NOT DETECTED.  The SARS-CoV-2 RNA is generally detectable in upper and lower respiratory specimens during the acute phase of infection. The lowest concentration of SARS-CoV-2 viral copies this assay can detect is 250 copies / mL. A negative result does not preclude SARS-CoV-2 infection and should not be used as the sole basis for treatment or other patient management decisions.  A negative result may occur with improper specimen collection / handling, submission of specimen other than nasopharyngeal swab, presence of viral mutation(s) within the areas targeted by this assay, and inadequate number of viral copies (<250 copies / mL). A negative result must be combined with clinical observations, patient history, and epidemiological information.  Fact Sheet for Patients:   StrictlyIdeas.no  Fact Sheet for Healthcare Providers: BankingDealers.co.za  This test is not yet approved or  cleared by the [place] FDA and has been authorized for detection and/or diagnosis of SARS-CoV-2 by FDA under an Emergency Use Authorization (EUA).  This EUA will remain in effect (meaning this test can be used) for the duration of the COVID-19 declaration under Section 564(b)(1) of the Act, 21 U.S.C. section 360bbb-3(b)(1), unless the authorization is terminated or revoked sooner.  Performed at [facility], [place] [address] [place], [place] [address]   Culture, Urine     Status: Abnormal   Collection Time: 09/30/19 12:56 PM   Specimen: Urine, Random  Result Value Ref Range Status   Specimen Description URINE, RANDOM  Final   Special Requests   Final    NONE Performed at  [facility], [place] [address] [place], [place] [address]    Culture MULTIPLE SPECIES PRESENT, SUGGEST RECOLLECTION (A)  Final   Report Status 10/01/2019 FINAL  Final      Studies: No results found.  Scheduled Meds:  acetaminophen  650 mg Oral Q8H   amLODipine  10 mg Oral Daily   aspirin  325 mg Oral Daily   atorvastatin  80 mg Oral Daily   clopidogrel  75 mg Oral Daily   enoxaparin (LOVENOX) injection  40 mg Subcutaneous Q24H   ferrous sulfate  325 mg Oral BID WC   folic acid  1 mg Oral Daily   insulin aspart  0-9  Units Subcutaneous TID WC   metoprolol tartrate  25 mg Oral BID   pantoprazole  40 mg Oral Q0600   thiamine  100 mg Oral Daily    Continuous Infusions:   LOS: 3 days     Alma Friendly, MD [facility]  If 7PM-7AM, please contact night-coverage [email or web address] 10/04/2019, 4:06 PM

## 2019-10-04 NOTE — TOC Progression Note (Addendum)
Transition of Care [facility]) - Progression Note    Patient Details  Name: Malik [name] MRN: [id number] Date of Birth: 1946-02-23  Transition of Care [facility] CM/SW [place], [place] Phone Number: 10/04/2019, 10:14 AM  Clinical Narrative:    CSW confirmed patient is able to return to [facility], but a new insurance authorization is needed. CSW started insurance auth, currently pending.  10:14a CSW attempted to make contact with [facility], left a voicemail. CSW awaiting a call back.  Expected Discharge Plan: [place] Barriers to Discharge: Continued Medical Work up, Ship broker  Expected Discharge Plan and Services Expected Discharge Plan: [place]       Living arrangements for the past 2 months: [place]                                       Social Determinants of Health (SDOH) Interventions    Readmission Risk Interventions No flowsheet data found.

## 2019-10-04 NOTE — Progress Notes (Signed)
Physical Therapy Treatment Patient Details Name: Malik [name] MRN: [id number] DOB: [birth year] Today's Date: 10/04/2019    History of Present Illness 74 y.o. male presenting from SNF with acute on chronic anemia, possible GI bleed, and acute metabolic encephalopathy. 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 Pt s/p EGD- angiodyplasia noted of the small bowels    PT Comments    Pt is progressing towards goals. Pt was +2 max assist with bed mobility; however, pt able to maintain sitting balance with only supervision assist. Pt getting agitated with attempts to perform HEP, so session limited.  Current recommendations appropriate. He will continue to benefit from acute therapy services to return to a more independent level of functional mobility. Will continue to follow.    Follow Up Recommendations  SNF     Equipment Recommendations  None recommended by PT    Recommendations for Other Services       Precautions / Restrictions Precautions Precautions: Fall Restrictions Weight Bearing Restrictions: No    Mobility  Bed Mobility Overal bed mobility: Needs Assistance Bed Mobility: Supine to Sit;Sit to Supine     Supine to sit: Max assist;+2 for physical assistance;HOB elevated Sit to supine: Max assist;+2 for physical assistance;HOB elevated   General bed mobility comments: pt required mutlimodal max cue for supine to sit and sit to supine, pt required max assist +2 with LE and trunk assistance for supine to sit and sit to supine.   Transfers                    Ambulation/Gait                 Stairs             Wheelchair Mobility    Modified Rankin (Stroke Patients Only)       Balance Overall balance assessment: Needs assistance;History of Falls Sitting-balance support:  Feet supported;No upper extremity supported;Single extremity supported Sitting balance-Leahy Scale: Fair Sitting balance - Comments: pt was able to maintain sitting balance without UE support and supervision assist.                                     Cognition Arousal/Alertness: Awake/alert Behavior During Therapy: Agitated;Flat affect Overall Cognitive Status: History of cognitive impairments - at baseline Area of Impairment: Following commands;Safety/judgement;Awareness;Problem solving                       Following Commands: Follows one step commands inconsistently;Follows one step commands with increased time Safety/Judgement: Decreased awareness of safety;Decreased awareness of deficits Awareness: Intellectual Problem Solving: Slow processing;Decreased initiation;Requires verbal cues;Requires tactile cues General Comments: pt able to follow simple commands with varying accuracy. Pt became slightly agitated as session progressed as noted as he attempted to swat away therapist's hands away      Exercises Other Exercises Other Exercises: Knee extension stretch, elbow flexion and extension stretch: 2 x 15 seconds ea Other Exercises: sitting balance exercise x 1 minute without UE support    General Comments General comments (skin integrity, edema, etc.): pt seemed more agitated as session progressed, pt didn't appreciate hands on exercises as noted with swatting at therapists hands. RUE and RLE tone noted in elbow flexor, extensors and knee flexors.  Pertinent Vitals/Pain Pain Assessment: Faces Faces Pain Scale: Hurts a little bit Pain Location: RUE/RLE Pain Descriptors / Indicators: Grimacing;Guarding Pain Intervention(s): Monitored during session;Limited activity within patient's tolerance;Repositioned    Home Living                      Prior Function            PT Goals (current goals can now be found in the care plan section)  Acute Rehab PT Goals Patient Stated Goal: no goals stated due to aphasia PT Goal Formulation: Patient unable to participate in goal setting Time For Goal Achievement: 10/15/19 Potential to Achieve Goals: Fair Progress towards PT goals: Progressing toward goals    Frequency    Min 3X/week      PT Plan Current plan remains appropriate    Co-evaluation              AM-PAC PT "6 Clicks" Mobility   Outcome Measure  Help needed turning from your back to your side while in a flat bed without using bedrails?: A Lot Help needed moving from lying on your back to sitting on the side of a flat bed without using bedrails?: Total Help needed moving to and from a bed to a chair (including a wheelchair)?: Total Help needed standing up from a chair using your arms (e.g., wheelchair or bedside chair)?: Total Help needed to walk in hospital room?: Total Help needed climbing 3-5 steps with a railing? : Total 6 Click Score: 7    End of Session   Activity Tolerance: Treatment limited secondary to agitation Patient left: in bed;with call Tymere Depuy/phone within reach;with bed alarm set Nurse Communication: Mobility status PT Visit Diagnosis: Muscle weakness (generalized) (M62.81);Hemiplegia and hemiparesis Hemiplegia - Right/Left: Right Hemiplegia - caused by: Cerebral infarction     Time: 1540-0867 PT Time Calculation (min) (ACUTE ONLY): 21 min  Charges:  $Therapeutic Activity: 8-22 mins                     Gloriann Loan, SPT  Acute Rehabilitation Services  Office: [phone]  {10/04/2019, 2:31 PM

## 2019-10-05 DIAGNOSIS — Z8673 Personal history of transient ischemic attack (TIA), and cerebral infarction without residual deficits: Secondary | ICD-10-CM | POA: Diagnosis not present

## 2019-10-05 DIAGNOSIS — N1831 Chronic kidney disease, stage 3a: Secondary | ICD-10-CM | POA: Diagnosis not present

## 2019-10-05 DIAGNOSIS — I5032 Chronic diastolic (congestive) heart failure: Secondary | ICD-10-CM | POA: Diagnosis not present

## 2019-10-05 DIAGNOSIS — D649 Anemia, unspecified: Secondary | ICD-10-CM | POA: Diagnosis not present

## 2019-10-05 DIAGNOSIS — F1010 Alcohol abuse, uncomplicated: Secondary | ICD-10-CM | POA: Diagnosis not present

## 2019-10-05 DIAGNOSIS — Z515 Encounter for palliative care: Secondary | ICD-10-CM | POA: Diagnosis not present

## 2019-10-05 DIAGNOSIS — Z7189 Other specified counseling: Secondary | ICD-10-CM | POA: Diagnosis not present

## 2019-10-05 LAB — CBC WITH DIFFERENTIAL/PLATELET
Abs Immature Granulocytes: 0.03 10*3/uL (ref 0.00–0.07)
Basophils Absolute: 0.1 10*3/uL (ref 0.0–0.1)
Basophils Relative: 1 %
Eosinophils Absolute: 0.6 10*3/uL — ABNORMAL HIGH (ref 0.0–0.5)
Eosinophils Relative: 5 %
HCT: 29.1 % — ABNORMAL LOW (ref 39.0–52.0)
Hemoglobin: 8.6 g/dL — ABNORMAL LOW (ref 13.0–17.0)
Immature Granulocytes: 0 %
Lymphocytes Relative: 21 %
Lymphs Abs: 2.1 10*3/uL (ref 0.7–4.0)
MCH: 24.2 pg — ABNORMAL LOW (ref 26.0–34.0)
MCHC: 29.6 g/dL — ABNORMAL LOW (ref 30.0–36.0)
MCV: 81.7 fL (ref 80.0–100.0)
Monocytes Absolute: 1 10*3/uL (ref 0.1–1.0)
Monocytes Relative: 10 %
Neutro Abs: 6.4 10*3/uL (ref 1.7–7.7)
Neutrophils Relative %: 63 %
Platelets: 579 10*3/uL — ABNORMAL HIGH (ref 150–400)
RBC: 3.56 MIL/uL — ABNORMAL LOW (ref 4.22–5.81)
RDW: 26.4 % — ABNORMAL HIGH (ref 11.5–15.5)
WBC: 10.2 10*3/uL (ref 4.0–10.5)
nRBC: 0.5 % — ABNORMAL HIGH (ref 0.0–0.2)

## 2019-10-05 LAB — GLUCOSE, CAPILLARY
Glucose-Capillary: 129 mg/dL — ABNORMAL HIGH (ref 70–99)
Glucose-Capillary: 186 mg/dL — ABNORMAL HIGH (ref 70–99)
Glucose-Capillary: 158 mg/dL — ABNORMAL HIGH (ref 70–99)
Glucose-Capillary: 174 mg/dL — ABNORMAL HIGH (ref 70–99)

## 2019-10-05 LAB — SARS CORONAVIRUS 2 (TAT 6-24 HRS): SARS Coronavirus 2: NEGATIVE

## 2019-10-05 NOTE — Progress Notes (Signed)
PROGRESS NOTE  Malik [name] ZDG:[id number] DOB: February 05, 1946 DOA: 09/28/2019 PCP: [facility], [place], NP  HPI/Recap of past 24 hours: HPI from Dr Simon Rhein is a 74 y.o. male with medical history significant for TBI with aphasia, recent CVA with right upper extremity hemiplegia on dual antiplatelet therapy, history of iron deficiency anemia, type 2 diabetes, CKD stage IIIa, hypertension, OSA, hyperlipidemia and alcohol abuse who presents with concerns of acute anemia. Patient received lab work at his nursing facility and was noted to have hemoglobin of 6.4 with baseline hemoglobin of 7-8. Of note, pt was started on Plavix and aspirin in June due to a new left MCA infarct and also started on oral iron supplement for iron deficiency anemia.  Patient found to be Hemoccult positive during this admission.  He follows with Dr. Clide Deutscher at [facility] [place].  Reportedly had heme positive stool back in 2019 without any intervention.  Unsure of last colonoscopy. In the ED, he was afebrile and hypertensive with systolic up to 595G.  Initially his hemoglobin came back around 9 but on repeat it was 6.4 and patient and his son agreed to have 1 unit of PRBC transfusion. Creatinine and BUN normal.   Patient admitted for further management.    Today, pt awake, alert, aphasic at baseline.Does not follow command. Able to feed himself though.     Assessment/Plan: Principal Problem:   Acute anemia Active Problems:   Essential hypertension   Chronic diastolic congestive heart failure (HCC)   Diabetes mellitus type 2 in nonobese (HCC)   CKD (chronic kidney disease), stage IIIa   Alcohol abuse   History of CVA (cerebrovascular accident)   Goals of care, counseling/discussion   Palliative care by specialist   ??Possible GI bleed Acute on chronic anemia Hemoglobin 6.4 on admission, baseline around 7-8 FOBT positive CT abdomen/pelvis with no definitive source identified for  bleeding S/p transfusion of 1 unit of PRBC S/p IV Feraheme, continue oral iron supplementation when able On Plavix, aspirin PTA, restart as per GI Continue PPI GI consulted, s/p endoscopy on 10/02/2019 which showed angiodysplasia of the small bowel without bleeding Daily CBC  Acute metabolic encephalopathy Stable Unknown etiology Afebrile with leukocytosis (noted to be chronic) Urine culture, suggest recollection Chest x-ray showed decreased bibasilar opacities suggesting improving atelectasis or pneumonia CT head without contrast no acute intracranial abnormality Fall precautions  History of CVA/TBI/HLD Noted to be aphasic at baseline, with right upper extremity hemiplegia Recent stroke in June/2021 Restart aspirin and Plavix for now due to anemia Continue Lipitor Telemetry  Chronic diastolic HF/pulmonary hypertension Does not appear overloaded Echo done on 08/2019 showed EF of 50 to 55%, no regional wall motion abnormality, grade 2 diastolic dysfunction Monitor closely   CKD stage IIIa Creatinine stable Daily BMP  Hypertension Continue home amlodipine, metoprolol IV hydralazine as needed  Diabetes mellitus type 2 Last A1c 5.4 SSI, Accu-Cheks, hypoglycemic protocol Hold home Metformin  Thrombocytosis Chronic  Possibly due to anemia Daily CBC  Alcohol abuse Alcohol level less than 10 CIWA protocol  Severe dysphagia Seen by SLP in 08/2019, recommend dysphagia 1 diet and nectar thick liquid SLP consulted to reevaluate  Goals of care discussion Noted very poor prognosis, multiple chronic issues Palliative consulted for goals of care discussion       Malnutrition Type:      Malnutrition Characteristics:      Nutrition Interventions:       Estimated body mass index is 28.84 kg/m as calculated  from the following:   Height as of this encounter: 5\' 10"  (1.778 m).   Weight as of this encounter: 91.2 kg.     Code Status: Full  Family  Communication: None at bedside  Disposition Plan: Status is: Inpatient    Dispo: The patient is from: SNF              Anticipated d/c is to: SNF              Anticipated d/c date is: 2 days              Patient currently is not medically stable to d/c. Awaiting SNF     Consultants:  GI  Procedures:  EGD on 10/02/2019  Antimicrobials:  None  DVT prophylaxis: Lovenox   Objective: Vitals:   10/04/19 1126 10/04/19 2005 10/05/19 0446 10/05/19 1943  BP: (!) 159/78 (!) 150/78 (!) 160/71 (!) 149/71  Pulse: 73 81 70 76  Resp: 20 17 16 17   Temp: 98.4 F (36.9 C) 97.6 F (36.4 C) 98 F (36.7 C) 98.4 F (36.9 C)  TempSrc: Oral Oral Oral Oral  SpO2: 99% 100% 96% 96%  Weight:   91.2 kg   Height:        Intake/Output Summary (Last 24 hours) at 10/05/2019 2130 Last data filed at 10/05/2019 1700 Gross per 24 hour  Intake 800 ml  Output 950 ml  Net -150 ml   Filed Weights   10/03/19 0347 10/04/19 0427 10/05/19 0446  Weight: 90.7 kg (!) 93 kg 91.2 kg    Exam:  General: NAD, aphasic  Cardiovascular: S1, S2 present  Respiratory: CTAB  Abdomen: Soft, nontender, nondistended, bowel sounds present  Musculoskeletal: No bilateral pedal edema noted  Skin: Normal  Psychiatry:  Unable to assess  Neurology: Unable to assess    Data Reviewed: CBC: Recent Labs  Lab 10/01/19 0722 10/02/19 0728 10/03/19 0636 10/04/19 0507 10/05/19 0418  WBC 13.6* 12.4* 13.2* 16.8* 10.2  NEUTROABS 9.1* 8.0* 8.7* 12.2* 6.4  HGB 9.8* 9.0* 9.1* 9.0* 8.6*  HCT 31.5* 29.3* 30.1* 30.5* 29.1*  MCV 76.5* 76.9* 78.8* 80.9 81.7  PLT 637* 602* 580* 578* 614*   Basic Metabolic Panel: Recent Labs  Lab 09/30/19 0518 10/01/19 0722 10/02/19 0728 10/03/19 0636 10/04/19 0507  NA 137 139 140 142 144  K 4.0 3.9 3.5 4.1 4.0  CL 105 106 107 111 112*  CO2 22 21* 21* 21* 23  GLUCOSE 126* 111* 125* 97 105*  BUN 12 10 15 14 15   CREATININE 0.92 1.20 1.32* 1.29* 1.31*  CALCIUM 9.6 9.8 9.4  9.3 9.1   GFR: Estimated Creatinine Clearance: 55.3 mL/min (A) (by C-G formula based on SCr of 1.31 mg/dL (H)). Liver Function Tests: No results for input(s): AST, ALT, ALKPHOS, BILITOT, PROT, ALBUMIN in the last 168 hours. No results for input(s): LIPASE, AMYLASE in the last 168 hours. No results for input(s): AMMONIA in the last 168 hours. Coagulation Profile: No results for input(s): INR, PROTIME in the last 168 hours. Cardiac Enzymes: No results for input(s): CKTOTAL, CKMB, CKMBINDEX, TROPONINI in the last 168 hours. BNP (last 3 results) No results for input(s): PROBNP in the last 8760 hours. HbA1C: No results for input(s): HGBA1C in the last 72 hours. CBG: Recent Labs  Lab 10/04/19 1621 10/04/19 2119 10/05/19 0614 10/05/19 1106 10/05/19 1713  GLUCAP 144* 143* 129* 186* 158*   Lipid Profile: No results for input(s): CHOL, HDL, LDLCALC, TRIG, CHOLHDL, LDLDIRECT in the last 72  hours. Thyroid Function Tests: No results for input(s): TSH, T4TOTAL, FREET4, T3FREE, THYROIDAB in the last 72 hours. Anemia Panel: No results for input(s): VITAMINB12, FOLATE, FERRITIN, TIBC, IRON, RETICCTPCT in the last 72 hours. Urine analysis:    Component Value Date/Time   COLORURINE YELLOW 09/30/2019 1005   APPEARANCEUR CLEAR 09/30/2019 1005   APPEARANCEUR Clear 06/11/2011 2046   LABSPEC 1.014 09/30/2019 1005   LABSPEC 1.025 06/11/2011 2046   PHURINE 7.0 09/30/2019 1005   GLUCOSEU NEGATIVE 09/30/2019 1005   GLUCOSEU Negative 06/11/2011 2046   HGBUR NEGATIVE 09/30/2019 1005   BILIRUBINUR NEGATIVE 09/30/2019 1005   BILIRUBINUR Negative 06/11/2011 2046   [place] 09/30/2019 1005   PROTEINUR 30 (A) 09/30/2019 1005   NITRITE NEGATIVE 09/30/2019 1005   LEUKOCYTESUR NEGATIVE 09/30/2019 1005   LEUKOCYTESUR Negative 06/11/2011 2046   Sepsis Labs: @LABRCNTIP (procalcitonin:4,lacticidven:4)  ) Recent Results (from the past 240 hour(s))  SARS Coronavirus 2 by RT PCR (hospital order,  performed in [facility] hospital lab) Nasopharyngeal Nasopharyngeal Swab     Status: None   Collection Time: 09/28/19  8:51 PM   Specimen: Nasopharyngeal Swab  Result Value Ref Range Status   SARS Coronavirus 2 NEGATIVE NEGATIVE Final    Comment: (NOTE) SARS-CoV-2 target nucleic acids are NOT DETECTED.  The SARS-CoV-2 RNA is generally detectable in upper and lower respiratory specimens during the acute phase of infection. The lowest concentration of SARS-CoV-2 viral copies this assay can detect is 250 copies / mL. A negative result does not preclude SARS-CoV-2 infection and should not be used as the sole basis for treatment or other patient management decisions.  A negative result may occur with improper specimen collection / handling, submission of specimen other than nasopharyngeal swab, presence of viral mutation(s) within the areas targeted by this assay, and inadequate number of viral copies (<250 copies / mL). A negative result must be combined with clinical observations, patient history, and epidemiological information.  Fact Sheet for Patients:   StrictlyIdeas.no  Fact Sheet for Healthcare Providers: BankingDealers.co.za  This test is not yet approved or  cleared by the [place] FDA and has been authorized for detection and/or diagnosis of SARS-CoV-2 by FDA under an Emergency Use Authorization (EUA).  This EUA will remain in effect (meaning this test can be used) for the duration of the COVID-19 declaration under Section 564(b)(1) of the Act, 21 U.S.C. section 360bbb-3(b)(1), unless the authorization is terminated or revoked sooner.  Performed at [facility], [place] [address], [place], [place] [address]   Culture, Urine     Status: Abnormal   Collection Time: 09/30/19 12:56 PM   Specimen: Urine, Random  Result Value Ref Range Status   Specimen Description URINE, RANDOM  Final   Special Requests   Final     NONE Performed at [facility], [place] [address], [place], [place] [address]    Culture MULTIPLE SPECIES PRESENT, SUGGEST RECOLLECTION (A)  Final   Report Status 10/01/2019 FINAL  Final      Studies: No results found.  Scheduled Meds: . acetaminophen  650 mg Oral Q8H  . amLODipine  10 mg Oral Daily  . aspirin  325 mg Oral Daily  . atorvastatin  80 mg Oral Daily  . clopidogrel  75 mg Oral Daily  . enoxaparin (LOVENOX) injection  40 mg Subcutaneous Q24H  . ferrous sulfate  325 mg Oral BID WC  . folic acid  1 mg Oral Daily  . insulin aspart  0-9 Units Subcutaneous  TID WC  . metoprolol tartrate  25 mg Oral BID  . pantoprazole  40 mg Oral Q0600  . thiamine  100 mg Oral Daily    Continuous Infusions:   LOS: 4 days     Alma Friendly, MD [facility]  If 7PM-7AM, please contact night-coverage [email or web address] 10/05/2019, 9:30 PM

## 2019-10-05 NOTE — TOC Progression Note (Signed)
Transition of Care [facility]) - Progression Note    Patient Details  Name: Malik [name] MRN: [id number] Date of Birth: Jul 19, 1945  Transition of Care [facility]) CM/SW [place], [place] Phone Number: 10/05/2019, 4:39 PM  Clinical Narrative:    CSW checked on the status of patient's insurance authorizations, currently still pending. Reference #[id number]. CSW contacted patient's son to provide an update.   Expected Discharge Plan: [place] Barriers to Discharge: Continued Medical Work up, Ship broker  Expected Discharge Plan and Services Expected Discharge Plan: [place]       Living arrangements for the past 2 months: [place]                                       Social Determinants of Health (SDOH) Interventions    Readmission Risk Interventions No flowsheet data found.

## 2019-10-05 NOTE — Consult Note (Signed)
Consultation Note Date: 10/05/2019   Patient Name: Malik [name]  DOB: May 31, 1945  MRN: [id number]  Age / Sex: 74 y.o., male  PCP: [facility], [place], NP Referring Physician: Alma Friendly, MD  Reason for Consultation: Establishing goals of care  HPI/Patient Profile: 74 y.o. male  with past medical history of traumatic brain injury with aphasia, recent left MCA stroke with right upper extremity hemiplegia on dual antiplatelet therapy, diastolic heart failure EF 50-55%, pulmonary hypertension, iron deficiency anemia, diabetes, CKD stage 3a, hypertension, OSA, HLD, alcohol abuse admitted on 09/28/2019 from [facility] with hemoglobin 6.4 and concern for GI bleed s/p endoscopy 10/02/19 showing angiodysplasia of small bowel without bleeding. Overall deconditioning since stroke June 2021 requiring dysphagia 1, nectar thick diet.   Clinical Assessment and Goals of Care: I met today with Malik [name]. He is alert and interactive. He attempts speech but only garbled sounds and no comprehensible words. He does nod his head yes/no and seems to be mostly appropriate. He does squeeze my hand and follow simple commands and makes eye contact. Nods head yes to returning to SNF.   I called and spoke with son/HCPOA, Malik [name]. We discussed the changes in his father's status over the past couple of months after stroke. I expressed concern for his father's quality of life especially with anticipation that he will need ongoing long term care. Malik [name] agrees this is concerning and reports his father liked to always stay busy and be outside. We discussed expectations that his father will be at increased risk for further complications such as infections, skin breakdown, and further decline in functional status and quality of life. Malik [name] has not had conversations with his father regarding his wishes  previously. I discussed with Malik [name] the importance of having these discussions and thinking about if his father's condition were to worsen or if he were to have another stroke what this would mean. Malik [name] acknowledges that they would have to think and discuss this more. I encouraged him to make these considerations before they get to a point of having to make difficult decisions on the spot.   At this time Malik [name] remains hopeful that his father will continue to improve and do well. He is concerned with his diet and says that Malik [name] was cleared and tolerating regular diet at SNF and he would bring his fried chicken and normal foods. He expressed concern that his father was losing weight prior to change to regular diet. I will request SLP to re-evaluate for advancement of diet at son's request. No changes in goals at current time. Currently desire full code status. Recommend ongoing discussions and son seems open to conversation.   All questions/concerns addressed. Emotional support provided.   Primary Decision Maker HCPOA son Malik [name]    SUMMARY OF RECOMMENDATIONS   - Recommend outpatient palliative to follow and continue goals of care conversations  Code Status/Advance Care Planning:  Full code   Symptom Management:   Per attending.   Palliative Prophylaxis:   Aspiration,  Bowel Regimen, Delirium Protocol, Frequent Pain Assessment, Oral Care and Turn Reposition  Additional Recommendations (Limitations, Scope, Preferences):  Full Scope Treatment  Psycho-social/Spiritual:   Desire for further Chaplaincy support:no  Prognosis:   Unable to determine. High risk for further decline and decompensation although currently stable and intake good.   Discharge Planning: [place] for rehab with Palliative care service follow-up      Primary Diagnoses: Present on Admission: . Acute anemia . Essential hypertension . Chronic diastolic congestive heart  failure ([place]) . Alcohol abuse . CKD (chronic kidney disease), stage IIIa   I have reviewed the medical record, interviewed the patient and family, and examined the patient. The following aspects are pertinent.  Past Medical History:  Diagnosis Date  . Angina of effort ([place])   . Chronic diastolic CHF (congestive heart failure) ([place])   . CVA (cerebral vascular accident) ([place]) 08/10/2019   right sided weakness  . Diabetes ([place])   . Diabetes mellitus without complication ([place])   . Dilated cardiomyopathy ([place])   . Heart disease   . High blood pressure   . Hypertension   . 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 Moderate mitral insufficiency   . Neuropathy    with LE weakness  . Sleep apnea   . TBI (traumatic brain injury) [facility])    Social History   Socioeconomic History  . Marital status: Single    Spouse name: Not on file  . Number of children: Not on file  . Years of education: Not on file  . Highest education level: Not on file  Occupational History  . Not on file  Tobacco Use  . Smoking status: Former Smoker    Packs/day: 1.50    Years: 30.00    Pack years: 45.00    Types: Cigarettes  . Smokeless tobacco: Never Used  Vaping Use  . Vaping Use: Never used  Substance and Sexual Activity  . Alcohol use: Yes  . Drug use: No  . Sexual activity: Not on file  Other Topics Concern  . Not on file  Social History Narrative   ** Merged History Encounter **       Social Determinants of Health   Financial Resource Strain:   . Difficulty of Paying Living Expenses:   Food Insecurity:   . Worried About Charity fundraiser in the Last Year:   . Arboriculturist in the Last Year:   Transportation Needs:   . Film/video editor (Medical):   Marland Kitchen Lack of Transportation (Non-Medical):   Physical Activity:   . Days of Exercise per Week:   . Minutes of Exercise per Session:   Stress:   . Feeling of Stress :   Social Connections:   . Frequency of  Communication with Friends and Family:   . Frequency of Social Gatherings with Friends and Family:   . Attends Religious Services:   . Active Member of Clubs or Organizations:   . Attends Archivist Meetings:   Marland Kitchen Marital Status:    Family History  Problem Relation Age of Onset  . Hypertension Mother   . Hypertension Other    Scheduled Meds: . acetaminophen  650 mg Oral Q8H  . amLODipine  10 mg Oral Daily  . aspirin  325 mg Oral Daily  . atorvastatin  80 mg Oral Daily  . clopidogrel  75 mg Oral Daily  . enoxaparin (LOVENOX) injection  40 mg Subcutaneous Q24H  .  ferrous sulfate  325 mg Oral BID WC  . folic acid  1 mg Oral Daily  . insulin aspart  0-9 Units Subcutaneous TID WC  . metoprolol tartrate  25 mg Oral BID  . pantoprazole  40 mg Oral Q0600  . thiamine  100 mg Oral Daily   Continuous Infusions: PRN Meds:.hydrALAZINE, Resource ThickenUp Clear No Known Allergies Review of Systems  Unable to perform ROS: Patient nonverbal    Physical Exam Vitals and nursing note reviewed.  Constitutional:      General: He is not in acute distress. Cardiovascular:     Rate and Rhythm: Normal rate.  Pulmonary:     Effort: Pulmonary effort is normal. No tachypnea, accessory muscle usage or respiratory distress.  Abdominal:     General: Abdomen is flat.     Palpations: Abdomen is soft.  Neurological:     Mental Status: He is alert.     Comments: He seems to have understanding during my visit but unable to fully assess orientation due to aphasia     Vital Signs: BP (!) 160/71 (BP Location: Left Arm)   Pulse 70   Temp 98 F (36.7 C) (Oral)   Resp 16   Ht _0  (1.778 m)   Wt 91.2 kg   SpO2 96%   BMI 28.84 kg/m  Pain Scale: 0-10   Pain Score: 0-No pain   SpO2: SpO2: 96 % O2 Device:SpO2: 96 % O2 Flow Rate: .O2 Flow Rate (L/min): 2 L/min  IO: Intake/output summary:   Intake/Output Summary (Last 24 hours) at 10/05/2019 0916 Last data filed at 10/05/2019  0831 Gross per 24 hour  Intake 920 ml  Output 750 ml  Net 170 ml    LBM: Last BM Date: 10/04/19 Baseline Weight: Weight: 96.5 kg Most recent weight: Weight: 91.2 kg     Palliative Assessment/Data:     Time In: 1330 Time Out: 1420 Time Total: 50 min Greater than 50%  of this time was spent counseling and coordinating care related to the above assessment and plan.  Signed by: Vinie Sill, NP Palliative Medicine Team Pager # [phone] (M-F 8a-5p) Team Phone # [phone] (Nights/Weekends)

## 2019-10-06 DIAGNOSIS — I10 Essential (primary) hypertension: Secondary | ICD-10-CM | POA: Diagnosis not present

## 2019-10-06 DIAGNOSIS — D649 Anemia, unspecified: Secondary | ICD-10-CM | POA: Diagnosis not present

## 2019-10-06 DIAGNOSIS — N1831 Chronic kidney disease, stage 3a: Secondary | ICD-10-CM | POA: Diagnosis not present

## 2019-10-06 LAB — CBC WITH DIFFERENTIAL/PLATELET
Abs Immature Granulocytes: 0.04 10*3/uL (ref 0.00–0.07)
Basophils Absolute: 0.1 10*3/uL (ref 0.0–0.1)
Basophils Relative: 1 %
Eosinophils Absolute: 0.8 10*3/uL — ABNORMAL HIGH (ref 0.0–0.5)
Eosinophils Relative: 6 %
HCT: 32.7 % — ABNORMAL LOW (ref 39.0–52.0)
Hemoglobin: 9.4 g/dL — ABNORMAL LOW (ref 13.0–17.0)
Immature Granulocytes: 0 %
Lymphocytes Relative: 29 %
Lymphs Abs: 3.8 10*3/uL (ref 0.7–4.0)
MCH: 23.7 pg — ABNORMAL LOW (ref 26.0–34.0)
MCHC: 28.7 g/dL — ABNORMAL LOW (ref 30.0–36.0)
MCV: 82.4 fL (ref 80.0–100.0)
Monocytes Absolute: 1 10*3/uL (ref 0.1–1.0)
Monocytes Relative: 8 %
Neutro Abs: 7.2 10*3/uL (ref 1.7–7.7)
Neutrophils Relative %: 56 %
Platelets: 537 10*3/uL — ABNORMAL HIGH (ref 150–400)
RBC: 3.97 MIL/uL — ABNORMAL LOW (ref 4.22–5.81)
RDW: 27 % — ABNORMAL HIGH (ref 11.5–15.5)
WBC: 12.9 10*3/uL — ABNORMAL HIGH (ref 4.0–10.5)
nRBC: 0.2 % (ref 0.0–0.2)

## 2019-10-06 LAB — BASIC METABOLIC PANEL
Anion gap: 7 (ref 5–15)
BUN: 13 mg/dL (ref 8–23)
CO2: 27 mmol/L (ref 22–32)
Calcium: 9.4 mg/dL (ref 8.9–10.3)
Chloride: 112 mmol/L — ABNORMAL HIGH (ref 98–111)
Creatinine, Ser: 1.03 mg/dL (ref 0.61–1.24)
GFR calc Af Amer: >60 mL/min (ref >60)
GFR calc non Af Amer: >60 mL/min (ref >60)
Glucose, Bld: 108 mg/dL — ABNORMAL HIGH (ref 70–99)
Potassium: 4.3 mmol/L (ref 3.5–5.1)
Sodium: 146 mmol/L — ABNORMAL HIGH (ref 135–145)

## 2019-10-06 LAB — GLUCOSE, CAPILLARY
Glucose-Capillary: 105 mg/dL — ABNORMAL HIGH (ref 70–99)
Glucose-Capillary: 161 mg/dL — ABNORMAL HIGH (ref 70–99)
Glucose-Capillary: 143 mg/dL — ABNORMAL HIGH (ref 70–99)
Glucose-Capillary: 384 mg/dL — ABNORMAL HIGH (ref 70–99)
Glucose-Capillary: 128 mg/dL — ABNORMAL HIGH (ref 70–99)

## 2019-10-06 MED ORDER — FUROSEMIDE 40 MG PO TABS
40.0000 mg | ORAL_TABLET | Freq: Every day | ORAL | Status: AC
  Administered 2019-10-06: 40 mg via ORAL
  Filled 2019-10-06: qty 1

## 2019-10-06 MED ORDER — CLOPIDOGREL BISULFATE 75 MG PO TABS
75.0000 mg | ORAL_TABLET | Freq: Every day | ORAL | Status: DC
  Administered 2019-10-07: 75 mg via ORAL
  Filled 2019-10-06: qty 1

## 2019-10-06 NOTE — Progress Notes (Signed)
[facility])   Referral for outpatient palliative care services at [facility] once pt discharges.  [facility] will f/u with Malik [name] and family once he has been discharged to their facility.  Venia Carbon RN, BSN, [facility] Liaison

## 2019-10-06 NOTE — Care Management Important Message (Signed)
Important Message  Patient Details  Name: Malik [name] MRN: [id number] Date of Birth: [birth year]   Medicare Important Message Given:  Yes     Shelda Altes 10/06/2019, 9:54 AM

## 2019-10-06 NOTE — NC FL2 (Signed)
[place] MEDICAID FL2 LEVEL OF CARE SCREENING TOOL     IDENTIFICATION  Patient Name: Malik [name] Birthdate: August 18, 1945 Sex: male Admission Date (Current Location): 09/28/2019  [facility] [place] Number:  Herbalist and Address:  [facility], [place] [address], [place], [place] [address]      Provider Number: [id number]  Attending Physician Name and Address:  Aileen Fass, Tammi Klippel, MD  Relative Name and Phone Number:       Current Level of Care: Hospital Recommended Level of Care: [place] Prior Approval Number:    Date Approved/Denied:   PASRR Number: [id number]  Discharge Plan: SNF    Current Diagnoses: Patient Active Problem List   Diagnosis Date Noted   Goals of care, counseling/discussion    Palliative care by specialist    Acute anemia 09/28/2019   History of CVA (cerebrovascular accident) 09/28/2019   Hyperlipidemia LDL goal <70 08/24/2019   Dysphagia due to recent cerebral infarction 08/24/2019   Severe protein-calorie malnutrition ([place]) 08/24/2019   Iron deficiency anemia 08/24/2019   AKI (acute kidney injury) ([place]) 08/24/2019   CKD (chronic kidney disease), stage IIIa 08/24/2019   Alcohol abuse 08/24/2019   Obesity 08/24/2019   Multinodular thyroid 08/24/2019   Pressure injury of skin 41/32/4401   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, unknown source 08/10/2019   Thrombocytosis ([place])    Acute lower UTI    Labile blood pressure    Hydrothorax    Sleep disturbance    Pleural effusion on left    Hypertensive crisis    Hypertension    Leukocytosis    Benign essential HTN    Diabetes mellitus type 2 in nonobese (HCC)    Acute blood loss anemia    Trauma 05/27/2017   TBI (traumatic brain injury) [facility])    Essential hypertension    Chronic diastolic congestive heart failure (HCC)    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   Rib fractures 05/10/2017   Anemia due to  chronic kidney disease 07/01/2015    Orientation RESPIRATION BLADDER Height & Weight     Self  Normal Incontinent, External catheter Weight: 200 lb (90.7 kg) Height:  5\' 10"  (177.8 cm)  BEHAVIORAL SYMPTOMS/MOOD NEUROLOGICAL BOWEL NUTRITION STATUS      Incontinent Diet (see discharge summary)  AMBULATORY STATUS COMMUNICATION OF NEEDS Skin   Extensive Assist Verbally Other (Comment) (dry skin)                       Personal Care Assistance Level of Assistance  Bathing, Feeding, Dressing Bathing Assistance: Maximum assistance Feeding assistance: Maximum assistance Dressing Assistance: Maximum assistance     Functional Limitations Info  Speech, Sight, Hearing Sight Info: Adequate Hearing Info: Impaired (slurred/dysarthria) Speech Info: Adequate    SPECIAL CARE FACTORS FREQUENCY  PT (By licensed PT), OT (By licensed OT)     PT Frequency: 5x week OT Frequency: 5x week            Contractures Contractures Info: Not present    Additional Factors Info  Code Status, Allergies, Insulin Sliding Scale Code Status Info: Full Code Allergies Info: No Know Allergies   Insulin Sliding Scale Info: insulin aspart (novoLOG) injection 0-9 Units 3x daily with meals       Current Medications (10/06/2019):  This is the current hospital active medication list Current Facility-Administered Medications  Medication Dose Route Frequency Provider Last Rate Last Admin   acetaminophen (TYLENOL) tablet 650 mg  650 mg Oral Q8H Irene Shipper, MD   650 mg at 10/06/19 1339   amLODipine (NORVASC) tablet 10 mg  10 mg Oral Daily Irene Shipper, MD   10 mg at 10/06/19 7371   aspirin EC tablet 325 mg  325 mg Oral Daily Alma Friendly, MD   325 mg at 10/06/19 0626   atorvastatin (LIPITOR) tablet 80 mg  80 mg Oral Daily Irene Shipper, MD   80 mg at 10/06/19 9485   clopidogrel (PLAVIX) tablet 75 mg  75 mg Oral Daily Charlynne Cousins, MD       enoxaparin (LOVENOX) injection 40 mg  40 mg  Subcutaneous Q24H Alma Friendly, MD   40 mg at 10/03/19 2132   ferrous sulfate tablet 325 mg  325 mg Oral BID WC Irene Shipper, MD   325 mg at 46/27/03 5009   folic acid (FOLVITE) tablet 1 mg  1 mg Oral Daily Irene Shipper, MD   1 mg at 10/06/19 0834   hydrALAZINE (APRESOLINE) injection 10 mg  10 mg Intravenous Q8H PRN Irene Shipper, MD   10 mg at 09/30/19 1821   insulin aspart (novoLOG) injection 0-9 Units  0-9 Units Subcutaneous TID WC Irene Shipper, MD   2 Units at 10/06/19 1125   metoprolol tartrate (LOPRESSOR) tablet 25 mg  25 mg Oral BID Irene Shipper, MD   25 mg at 10/06/19 3818   pantoprazole (PROTONIX) EC tablet 40 mg  40 mg Oral Q0600 Irene Shipper, MD   40 mg at 10/06/19 2993   Resource ThickenUp Clear   Oral PRN Irene Shipper, MD       thiamine tablet 100 mg  100 mg Oral Daily Irene Shipper, MD   100 mg at 10/06/19 7169     Discharge Medications: Please see discharge summary for a list of discharge medications.  Relevant Imaging Results:  Relevant Lab Results:   Additional Information SS# [id number]; outpatient palliative services requested.  Alexander [place], LCSW

## 2019-10-06 NOTE — Evaluation (Signed)
Clinical/Bedside Swallow Evaluation Patient Details  Name: Malik [name] MRN: [id number] Date of Birth: September 24, 1945  Today's Date: 10/06/2019 Time: SLP Start Time (ACUTE ONLY): 27 SLP Stop Time (ACUTE ONLY): 1240 SLP Time Calculation (min) (ACUTE ONLY): 20 min  Past Medical History:  Past Medical History:  Diagnosis Date  . Angina of effort ([place])   . Chronic diastolic CHF (congestive heart failure) ([place])   . CVA (cerebral vascular accident) ([place]) 08/10/2019   right sided weakness  . Diabetes ([place])   . Diabetes mellitus without complication ([place])   . Dilated cardiomyopathy ([place])   . Heart disease   . High blood pressure   . Hypertension   . 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 Moderate mitral insufficiency   . Neuropathy    with LE weakness  . Sleep apnea   . TBI (traumatic brain injury) [facility])    Past Surgical History:  Past Surgical History:  Procedure Laterality Date  . BACK SURGERY    . ESOPHAGOGASTRODUODENOSCOPY (EGD) WITH PROPOFOL N/A 10/02/2019   Procedure: ESOPHAGOGASTRODUODENOSCOPY (EGD) WITH PROPOFOL;  Surgeon: Irene Shipper, MD;  Location[facility] ENDOSCOPY;  Service: Endoscopy;  Laterality: N/A;  . IR CT HEAD LTD  08/10/2019  . IR PERCUTANEOUS ART THROMBECTOMY/INFUSION INTRACRANIAL INC DIAG ANGIO  08/10/2019  . RADIOLOGY WITH ANESTHESIA N/A 08/10/2019   Procedure: IR WITH ANESTHESIA -CODE STROKE;  Surgeon: Radiologist, Medication, MD;  Location: [place];  Service: Radiology;  Laterality: N/A;  . SPLENECTOMY, TOTAL N/A 05/10/2017   Procedure: TRAUMA EXPLORATORY LAP FOR SPLENECTOMY;  Surgeon: Clovis Riley, MD;  Location: [place];  Service: General;  Laterality: N/A;   HPI:  74 y.o. male admitted 09/28/19 with anemia. PMH: TBI with aphasia, CVA RUE hemiplegia, iron deficiency anemia, DM2, CKD3a, HTN, OSA, HLD, alcohol abuse.   Assessment / Plan / Recommendation Clinical Impression  Orders received for repeat BSE per family request. Pt was  eating lunch upon arrival of SLP. He was struggling with self-feeding magic cup due to inability to use his RUE. Pt was observed with thin liquids, nectar thick liquid, puree, and solid textures. Right anterior leakage noted, and pt may exhibit pocketing on the right, but no overt s/s aspiration were observed on any consistency given. Will advance solids to dys 2 to facilitate self feeding, and will advance liquids to thin. Continue crushed meds. Safe swallow precautions posted at [facility]. SLP will follow for assessment of diet tolerance and education. RN and MD informed.   SLP Visit Diagnosis: Dysphagia, oropharyngeal phase (R13.12)    Aspiration Risk  Mild aspiration risk    Diet Recommendation Dysphagia 2 (Fine chop);Thin liquid   Liquid Administration via: Cup;Straw Medication Administration: Crushed with puree Supervision: Intermittent supervision to cue for compensatory strategies;Staff to assist with self feeding;Patient able to self feed Compensations: Slow rate;Small sips/bites;Monitor for anterior loss;Lingual sweep for clearance of pocketing Postural Changes: Seated upright at 90 degrees;Remain upright for at least 30 minutes after po intake    Other  Recommendations Oral Care Recommendations: Oral care BID   Follow up Recommendations None      Frequency and Duration min 1 x/week  2 weeks;1 week       Prognosis Prognosis for Safe Diet Advancement: Fair Barriers to Reach Goals: Time post onset;Language deficits      Swallow Study   General Date of Onset: 09/28/19 HPI: 74 y.o. male admitted 09/28/19 with anemia. PMH: TBI with aphasia, CVA RUE hemiplegia, iron deficiency anemia, DM2, CKD3a,  HTN, OSA, HLD, alcohol abuse. Type of Study: Bedside Swallow Evaluation Previous Swallow Assessment: MBS 08/17/19 - oral holding, R anterior spill, aspiration of thin liquids with cough Diet Prior to this Study: Dysphagia 1 (puree);Nectar-thick liquids Temperature Spikes Noted: No Respiratory  Status: Room air History of Recent Intubation: No Behavior/Cognition: Alert;Cooperative Oral Cavity Assessment: Within Functional Limits Oral Care Completed by SLP: No Oral Cavity - Dentition: Adequate natural dentition Vision: Functional for self-feeding Self-Feeding Abilities: Needs assist Patient Positioning: Upright in bed Baseline Vocal Quality: Not observed Volitional Cough: Cognitively unable to elicit Volitional Swallow: Unable to elicit    Oral/Motor/Sensory Function Overall Oral Motor/Sensory Function: Moderate impairment Facial ROM: Reduced right Facial Symmetry: Abnormal symmetry right Facial Strength: Reduced right   Ice Chips Ice chips: Not tested   Thin Liquid Thin Liquid: Impaired Presentation: Straw;Self Fed Oral Phase Impairments: Reduced labial seal Oral Phase Functional Implications: Right anterior spillage    Nectar Thick Nectar Thick Liquid: Impaired Presentation: Cup Oral Phase Impairments: Reduced labial seal Oral phase functional implications: Right anterior spillage   Honey Thick Honey Thick Liquid: Not tested   Puree Puree: Impaired Presentation: Spoon;Self Fed Oral Phase Impairments: Reduced labial seal Oral Phase Functional Implications: Right anterior spillage   Solid     Solid: Impaired Presentation: Self Fed Oral Phase Impairments: Reduced labial seal     Malik [name] B. Quentin Ore, [facility], [place] Speech Language Pathologist Office: [phone]  Shonna Chock 10/06/2019,1:00 PM

## 2019-10-06 NOTE — Progress Notes (Signed)
OT Cancellation Note  Patient Details Name: Malik [name] MRN: [id number] DOB: 08-08-1945   Cancelled Treatment:    Reason Eval/Treat Not Completed: Other (comment) (Patient eating lunch). OT will attempt re-attempt if time allows.   [name] OTR/L Supplemental OT, Department of rehab services [phone]  [name] R H.  10/06/2019, 12:06 PM

## 2019-10-06 NOTE — TOC Progression Note (Addendum)
Transition of Care [facility]) - Progression Note    Patient Details  Name: Malik [name] MRN: [id number] Date of Birth: Dec 29, 1945  Transition of Care [facility]) CM/SW [place], [place] Phone Number: 10/06/2019, 3:38 PM  Clinical Narrative:    CSW has f/u with [facility] w/ [facility] Medicare. Pt Josem Kaufmann has been approved for return to [facility]; ref #[id number]. Plan auth ID [id number]. Will update MD and [facility].  Referral also made to [place] for outpatient palliative services to follow at SNF.   Expected Discharge Plan: [place] Barriers to Discharge: Continued Medical Work up, Ship broker  Expected Discharge Plan and Services Expected Discharge Plan: [place]  Living arrangements for the past 2 months: [place]     Readmission Risk Interventions No flowsheet data found.

## 2019-10-06 NOTE — Progress Notes (Addendum)
[facility] PROGRESS NOTE    Progress Note  Malik [name]  JEH:[id number] DOB: 1945/07/12 DOA: 09/28/2019 PCP: [place], [place], NP     Brief Narrative:   Malik [name] is an 74 y.o. 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  In the ED he was found to be Hemoccult positive  Assessment/Plan:    Possible GI bleed/  Acute on chronic anemia: Hemoglobin back around 8, on admission 6.4 FOBT positive, CT scan of the abdomen and pelvis showed no signs of overt bleeding. He is status post 1 unit of packed red blood cell from [facility].,  His hemoglobin this morning is 9.4. The case was discussed with GI and they recommended to resume Plavix therapy, continue PPI. Upper endoscopy on 10/02/2019 that showed angiodysplasia of the small bowel without bleeding.  Might be the source of his chronic blood loss  Acute metabolic encephalopathy: Of unclear etiology has remained afebrile,.  This morning his white blood cell count is mildly elevated which is improved from the previous 16. Urine culture was inconclusive. Chest x-ray no new findings.  Of the head showed no acute findings. Physical therapy was consulted and he required 2 person assist will require skilled nursing facility. Fall precaution.  History of CVA: With a recent stroke in June 2021, on dual antiplatelet therapy. Continue Lipitor, discontinue telemetry.  Chronic diastolic heart failure/pulmonary hypertension: Appears euvolemic on physical exam 2D echo showing 08/29/2019 showed an EF of 50% no wall motion abnormality.  Chronic kidney disease stage IIIa: Creatinine seems to be at  baseline.  Essential hypertension: Continue amlodipine and metoprolol. Hydralazine IV as needed.  Diabetes mellitus type 2: With an A1c of 5.4, hold Metformin. Continue sliding scale insulin.  Chronic thrombocytosis: Likely due to iron deficiency anemia.  Is improving comparing to previous labs.  Alcohol abuse:  No signs of withdrawal continue thiamine and folate.  Severe dysphagia: Continue dysphagia 1 diet nectar thick.  Goals of care: Poor prognosis with multiple chronic conditions, Palliative Care has been consulted, who recommended outpatient palliative care follow-up for goals of care conversation, he remains full code.  RN Pressure Injury Documentation: Pressure Injury 08/13/19 Penis Anterior Stage 2 -  Partial thickness loss of dermis presenting as a shallow open injury with a red, pink wound bed without slough. (Active)  08/13/19 0730  Location: Penis  Location Orientation: Anterior  Staging: Stage 2 -  Partial thickness loss of dermis presenting as a shallow open injury with a red, pink wound bed without slough.  Wound Description (Comments):   Present on Admission: No    Estimated body mass index is 28.7 kg/m as calculated from the following:   Height as of this encounter: 5\' 10"  (1.778 m).   Weight as of this encounter: 90.7 kg.  DVT prophylaxis: heparin Family Communication:none Status is: Inpatient  Remains inpatient appropriate because:Hemodynamically unstable   Dispo: The patient is from: Home              Anticipated d/c is to: SNF              Anticipated d/c date is: 1 day  Patient currently is medically stable to d/c.  Awaiting insurance authorization.        Code Status:     Code Status Orders  (From admission, onward)         Start     Ordered   09/28/19 2328  Full code  Continuous        09/28/19 2328        Code Status History    Date Active Date Inactive Code Status Order ID Comments User Context   08/10/2019  1930 08/24/2019 2103 Full Code [id number]  Rosalin Hawking, MD ED   05/27/2017 1956 06/07/2017 1427 Full Code [id number]  Bary Leriche, PA-C Inpatient   05/27/2017 1956 05/27/2017 1956 Full Code [id number]  Flora Lipps Inpatient   05/10/2017 1735 05/27/2017 1754 Full Code [id number]  Clovis Riley, MD Inpatient   07/01/2015 0525 07/01/2015 1901 Full Code [id number]  Harrie Foreman, MD Inpatient   Advance Care Planning Activity        IV Access:    Peripheral IV   Procedures and diagnostic studies:   No results found.   Medical Consultants:    None.  Anti-Infectives:   None  Subjective:    [name]  no complaints feels great.  Objective:    Vitals:   10/05/19 0446 10/05/19 1943 10/06/19 0304 10/06/19 0312  BP: (!) 160/71 (!) 149/71  (!) 160/86  Pulse: 70 76  71  Resp: 16 17  17   Temp: 98 F (36.7 C) 98.4 F (36.9 C)  98.5 F (36.9 C)  TempSrc: Oral Oral  Oral  SpO2: 96% 96%  100%  Weight: 91.2 kg  90.7 kg   Height:       SpO2: 100 % O2 Flow Rate (L/min): 2 L/min   Intake/Output Summary (Last 24 hours) at 10/06/2019 0910 Last data filed at 10/06/2019 5400 Gross per 24 hour  Intake 675 ml  Output 500 ml  Net 175 ml   Filed Weights   10/04/19 0427 10/05/19 0446 10/06/19 0304  Weight: (!) 93 kg 91.2 kg 90.7 kg    Exam: General exam: In no acute distress. Respiratory system: Good air movement and clear to auscultation. Cardiovascular system: S1 & S2 heard, RRR. +JVD Gastrointestinal system: Abdomen is nondistended, soft and nontender.  Extremities: No pedal edema. Skin: No rashes, lesions or ulcers Psychiatry: Judgement and insight appear normal. Mood & affect appropriate.    Data Reviewed:    Labs: Basic Metabolic Panel: Recent Labs  Lab 10/01/19 0722 10/01/19 8676 10/02/19 1950 10/02/19 9326 10/03/19 0636 10/03/19 0636 10/04/19 0507 10/06/19 0617  NA 139  --  140  --  142  --  144 146*  K 3.9   < > 3.5   < > 4.1   < >  4.0 4.3  CL 106  --  107  --  111  --  112* 112*  CO2 21*  --  21*  --  21*  --  23 27  GLUCOSE 111*  --  125*  --  97  --  105* 108*  BUN 10  --  15  --  14  --  15 13  CREATININE 1.20  --  1.32*  --  1.29*  --  1.31* 1.03  CALCIUM 9.8  --  9.4  --  9.3  --  9.1 9.4   < > = values in this interval not displayed.   GFR Estimated Creatinine Clearance: 70.2 mL/min (by  C-G formula based on SCr of 1.03 mg/dL). Liver Function Tests: No results for input(s): AST, ALT, ALKPHOS, BILITOT, PROT, ALBUMIN in the last 168 hours. No results for input(s): LIPASE, AMYLASE in the last 168 hours. No results for input(s): AMMONIA in the last 168 hours. Coagulation profile No results for input(s): INR, PROTIME in the last 168 hours. COVID-19 Labs  No results for input(s): DDIMER, FERRITIN, LDH, CRP in the last 72 hours.  Lab Results  Component Value Date   SARSCOV2NAA NEGATIVE 10/05/2019   [place] NEGATIVE 09/28/2019   [place] NEGATIVE 08/23/2019   [place] NEGATIVE 08/10/2019    CBC: Recent Labs  Lab 10/02/19 0728 10/03/19 0636 10/04/19 0507 10/05/19 0418 10/06/19 0617  WBC 12.4* 13.2* 16.8* 10.2 12.9*  NEUTROABS 8.0* 8.7* 12.2* 6.4 7.2  HGB 9.0* 9.1* 9.0* 8.6* 9.4*  HCT 29.3* 30.1* 30.5* 29.1* 32.7*  MCV 76.9* 78.8* 80.9 81.7 82.4  PLT 602* 580* 578* 579* 537*   Cardiac Enzymes: No results for input(s): CKTOTAL, CKMB, CKMBINDEX, TROPONINI in the last 168 hours. BNP (last 3 results) No results for input(s): PROBNP in the last 8760 hours. CBG: Recent Labs  Lab 10/05/19 0614 10/05/19 1106 10/05/19 1713 10/05/19 2121 10/06/19 0607  GLUCAP 129* 186* 158* 174* 105*   D-Dimer: No results for input(s): DDIMER in the last 72 hours. Hgb A1c: No results for input(s): HGBA1C in the last 72 hours. Lipid Profile: No results for input(s): CHOL, HDL, LDLCALC, TRIG, CHOLHDL, LDLDIRECT in the last 72 hours. Thyroid function studies: No results for input(s): TSH, T4TOTAL,  T3FREE, THYROIDAB in the last 72 hours.  Invalid input(s): FREET3 Anemia work up: No results for input(s): VITAMINB12, FOLATE, FERRITIN, TIBC, IRON, RETICCTPCT in the last 72 hours. Sepsis Labs: Recent Labs  Lab 10/03/19 0636 10/04/19 0507 10/05/19 0418 10/06/19 0617  WBC 13.2* 16.8* 10.2 12.9*   Microbiology Recent Results (from the past 240 hour(s))  SARS Coronavirus 2 by RT PCR (hospital order, performed in [facility] hospital lab) Nasopharyngeal Nasopharyngeal Swab     Status: None   Collection Time: 09/28/19  8:51 PM   Specimen: Nasopharyngeal Swab  Result Value Ref Range Status   SARS Coronavirus 2 NEGATIVE NEGATIVE Final    Comment: (NOTE) SARS-CoV-2 target nucleic acids are NOT DETECTED.  The SARS-CoV-2 RNA is generally detectable in upper and lower respiratory specimens during the acute phase of infection. The lowest concentration of SARS-CoV-2 viral copies this assay can detect is 250 copies / mL. A negative result does not preclude SARS-CoV-2 infection and should not be used as the sole basis for treatment or other patient management decisions.  A negative result may occur with improper specimen collection / handling, submission of specimen other than nasopharyngeal swab, presence of viral mutation(s) within the areas targeted by this assay, and inadequate number of viral copies (<250 copies / mL). A negative result must be combined with clinical observations, patient history, and epidemiological information.  Fact Sheet for Patients:   StrictlyIdeas.no  Fact Sheet for Healthcare Providers: BankingDealers.co.za  This test is not yet approved or  cleared by the [place] FDA and has been authorized for detection and/or diagnosis of SARS-CoV-2 by FDA under an Emergency Use Authorization (EUA).  This EUA will remain in effect (meaning this test can be used) for the duration of the COVID-19 declaration under  Section 564(b)(1) of the Act, 21 U.S.C. section 360bbb-3(b)(1), unless the authorization is terminated or revoked sooner.  Performed at [facility], [place]  [address], [place], [place] [address]   Culture, Urine     Status: Abnormal   Collection Time: 09/30/19 12:56 PM   Specimen: Urine, Random  Result Value Ref Range Status   Specimen Description URINE, RANDOM  Final   Special Requests   Final    NONE Performed at [facility], [place] [address], [place], [place] [address]    Culture MULTIPLE SPECIES PRESENT, SUGGEST RECOLLECTION (A)  Final   Report Status 10/01/2019 FINAL  Final  SARS CORONAVIRUS 2 (TAT 6-24 HRS) Nasopharyngeal Nasopharyngeal Swab     Status: None   Collection Time: 10/05/19  4:58 PM   Specimen: Nasopharyngeal Swab  Result Value Ref Range Status   SARS Coronavirus 2 NEGATIVE NEGATIVE Final    Comment: (NOTE) SARS-CoV-2 target nucleic acids are NOT DETECTED.  The SARS-CoV-2 RNA is generally detectable in upper and lower respiratory specimens during the acute phase of infection. Negative results do not preclude SARS-CoV-2 infection, do not rule out co-infections with other pathogens, and should not be used as the sole basis for treatment or other patient management decisions. Negative results must be combined with clinical observations, patient history, and epidemiological information. The expected result is Negative.  Fact Sheet for Patients: SugarRoll.be  Fact Sheet for Healthcare Providers: https://www.woods-mathews.com/  This test is not yet approved or cleared by the [place] FDA and  has been authorized for detection and/or diagnosis of SARS-CoV-2 by FDA under an Emergency Use Authorization (EUA). This EUA will remain  in effect (meaning this test can be used) for the duration of the COVID-19 declaration under Se ction 564(b)(1) of the Act, 21 U.S.C. section 360bbb-3(b)(1), unless the  authorization is terminated or revoked sooner.  Performed at [facility], [place] [address], [place], [place] [address]      Medications:   . acetaminophen  650 mg Oral Q8H  . amLODipine  10 mg Oral Daily  . aspirin  325 mg Oral Daily  . atorvastatin  80 mg Oral Daily  . clopidogrel  75 mg Oral Daily  . enoxaparin (LOVENOX) injection  40 mg Subcutaneous Q24H  . ferrous sulfate  325 mg Oral BID WC  . folic acid  1 mg Oral Daily  . insulin aspart  0-9 Units Subcutaneous TID WC  . metoprolol tartrate  25 mg Oral BID  . pantoprazole  40 mg Oral Q0600  . thiamine  100 mg Oral Daily   Continuous Infusions:    LOS: 5 days   Charlynne Cousins  [facility]  10/06/2019, 9:10 AM

## 2019-10-07 DIAGNOSIS — G9340 Encephalopathy, unspecified: Secondary | ICD-10-CM

## 2019-10-07 DIAGNOSIS — D649 Anemia, unspecified: Secondary | ICD-10-CM | POA: Diagnosis not present

## 2019-10-07 DIAGNOSIS — I5032 Chronic diastolic (congestive) heart failure: Secondary | ICD-10-CM | POA: Diagnosis not present

## 2019-10-07 DIAGNOSIS — N1831 Chronic kidney disease, stage 3a: Secondary | ICD-10-CM | POA: Diagnosis not present

## 2019-10-07 DIAGNOSIS — E119 Type 2 diabetes mellitus without complications: Secondary | ICD-10-CM | POA: Diagnosis not present

## 2019-10-07 LAB — GLUCOSE, CAPILLARY
Glucose-Capillary: 101 mg/dL — ABNORMAL HIGH (ref 70–99)
Glucose-Capillary: 145 mg/dL — ABNORMAL HIGH (ref 70–99)

## 2019-10-07 NOTE — Progress Notes (Signed)
Report given to [place] [phone] is going to room 903, No further changes noted.

## 2019-10-07 NOTE — Progress Notes (Signed)
Physical Therapy Treatment Patient Details Name: Malik [name] MRN: [id number] DOB: Sep 18, 1945 Today's Date: 10/07/2019    History of Present Illness 74 y.o. male presenting from SNF with acute on chronic anemia, possible GI bleed, and acute metabolic encephalopathy. 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 Pt s/p EGD- angiodyplasia noted of the small bowels    PT Comments    Pt admitted with above diagnosis. Pt was able to get to EOB with min assist and cues with use of rail.  Stedy worked well as pt was able to stand with  Less physical assist.  Moved to chair with Stedy. Then pt stands without the [facility] with +2 min assist but not yet able to take steps as he relies on UE support for dynamic balance as well as statically.  Pt currently with functional limitations due to the deficits listed below (see PT Problem List). Pt will benefit from skilled PT to increase their independence and safety with mobility to allow discharge to the venue listed below.     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   Transfers Overall transfer level: Needs assistance   Transfers: Sit to/from Stand;Stand Pivot Transfers Sit to Stand: Min guard;Min assist;+2 safety/equipment;From elevated surface         General transfer comment:  Once  cued to place left hand on STedy (assist for right UE placement), pt was able to stand without any assist.  Stood and was able to maintain balance on Stedy with the UE support. Moved pt to chair in [place].  STood again with stedy with pt working on weight shifting.  Last stand, without Stedy.  Pt needs support on right LE if standing without bil Ue support.  Pt could not take a step even though attempted.    Ambulation/Gait             General Gait Details: unable at this time   Stairs             Wheelchair Mobility    Modified Rankin (Stroke Patients Only)       Balance Overall balance assessment: Needs assistance;History of Falls Sitting-balance support: Feet supported;No upper extremity supported;Single extremity supported Sitting balance-Leahy Scale: Fair Sitting balance - Comments: pt was able to maintain sitting balance without UE support and supervision assist.    Standing balance support: Bilateral upper extremity supported;During functional activity Standing balance-Leahy Scale: Poor Standing balance comment: Pt needed Ue support for standing statically in STedy.                             Cognition Arousal/Alertness: Awake/alert Behavior During Therapy: Agitated;Flat affect Overall Cognitive Status: History of cognitive impairments - at baseline Area of Impairment: Following commands;Safety/judgement;Awareness;Problem solving  Orientation Level: Person;Place;Time;Situation Current Attention Level: Focused Memory: Decreased short-term memory Following Commands: Follows one step commands inconsistently;Follows one step commands with increased time Safety/Judgement: Decreased awareness of safety;Decreased awareness of deficits Awareness: Intellectual Problem Solving: Slow processing;Decreased initiation;Requires verbal cues;Requires tactile cues General Comments: pt able to follow simple commands with varying accuracy.       Exercises General Exercises - Lower Extremity Ankle Circles/Pumps: AROM;Left;PROM;Right;10 reps Long Arc Quad: AROM;Both;10 reps;Seated Heel Slides: AROM;Left;PROM;Right;10 reps    General Comments        Pertinent Vitals/Pain Pain Assessment: No/denies pain    Home Living                      Prior Function            PT Goals (current goals can now be found in the care plan section) Progress towards PT goals: Progressing toward goals    Frequency    Min 3X/week      PT Plan Current plan remains appropriate    Co-evaluation              AM-PAC PT "6 Clicks" Mobility   Outcome Measure  Help needed turning from your back to your side while in a flat bed without using bedrails?: A Little Help needed moving from lying on your back to sitting on the side of a flat bed without using bedrails?: A Little Help needed moving to and from a bed to a chair (including a wheelchair)?: A Little Help needed standing up from a chair using your arms (e.g., wheelchair or bedside chair)?: A Little Help needed to walk in hospital room?: Total Help needed climbing 3-5 steps with a railing? : Total 6 Click Score: 14    End of Session Equipment Utilized During Treatment: Gait belt Activity Tolerance: Patient limited by fatigue;Patient tolerated treatment well Patient left: with call bell/phone within reach;in chair;with chair alarm set Nurse Communication: Mobility status PT Visit Diagnosis: Muscle weakness (generalized) (M62.81);Hemiplegia and hemiparesis Hemiplegia - Right/Left: Right Hemiplegia - caused by: Cerebral infarction     Time: 0905-0928 PT Time Calculation (min) (ACUTE ONLY): 23 min  Charges:  $Therapeutic Exercise: 8-22 mins $Therapeutic Activity: 8-22 mins                     Leontyne Manville W,PT Acute Rehabilitation Services Pager:  [phone]  Office:  [place] 10/07/2019, 12:46 PM

## 2019-10-07 NOTE — Plan of Care (Signed)

## 2019-10-07 NOTE — TOC Transition Note (Addendum)
Transition of Care [facility]) - CM/SW Discharge Note   Patient Details  Name: Eddison Searls MRN: [id number] Date of Birth: 1945-05-21  Transition of [facility] CM/SW Contact:  Trula Ore, [place] Phone Number: 10/07/2019, 11:37 AM   Clinical Narrative:     Patient will DC to: [facility]  Anticipated DC date: 10/07/2019  Family notified: Lyda Perone  Transport by: Corey Harold  ?  Per MD patient ready for DC to [facility] with palliative services to follow. RN, patient, patient's family,[facility] and facility notified of DC. Discharge Summary sent to facility. RN given number for report tele#[phone] [phone]. DC packet on chart. Ambulance transport requested for patient.  CSW signing off.  Final next level of care: Skilled Nursing Facility Barriers to Discharge: No Barriers Identified   Patient Goals and CMS Choice Patient states their goals for this hospitalization and ongoing recovery are:: SNF CMS Medicare.gov Compare Post Acute Care list provided to:: Patient Represenative (must comment) Lyda Perone) Choice offered to / list presented to : Adult Children Lyda Perone)  Discharge Placement              Patient chooses bed at: [facility] Patient to be transferred to facility by: [place] Name of family member notified: Lyda Perone Patient and family notified of of transfer: 10/07/19  Discharge Plan and Services                                     Social Determinants of Health (SDOH) Interventions     Readmission Risk Interventions No flowsheet data found.

## 2019-10-07 NOTE — Progress Notes (Signed)
Tele and IV removed per orders. Tolerated well.

## 2019-10-07 NOTE — Discharge Summary (Addendum)
Physician Discharge Summary  Malik [name] KWI:[id number] DOB: 03-03-1946 DOA: 09/28/2019  PCP: [facility], [place], NP  Admit date: 09/28/2019 Discharge date: 10/07/2019  Admitted From: home Disposition:  SNF  Recommendations for Outpatient Follow-up:  1. Follow up with GI in 1-2 weeks 2. Please obtain BMP/CBC in one week   Home Health:No Equipment/Devices:None  Discharge Condition:Stable CODE STATUS:Full Diet recommendation: Heart Healthy  Brief/Interim Summary: 74 y.o. 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  In the ED he was found to be Hemoccult positive  Discharge Diagnoses:  Principal Problem:   Acute anemia Active Problems:   Essential hypertension   Chronic diastolic congestive heart failure (HCC)   Diabetes mellitus type 2 in nonobese (HCC)   CKD (chronic kidney disease), stage IIIa   Alcohol abuse   History of CVA (cerebrovascular accident)   Goals of care, counseling/discussion   Palliative care by specialist  Possible GI bleed/acute on chronic anemia: His baseline hemoglobin was around 8 on admission 6.4 FOBT positive CT scan of the abdomen pelvis showed no signs of overt bleeding. He related no melanotic stools or hematemesis he was transfused 1 unit of packed red blood cells and given IV Feraheme he is his ferritin was low. GI was consulted they perform an endoscopy on 10/02/2019 that showed some angiodysplasia of the small bowel without bleeding which might be the source of his chronic blood loss. He was cauterized he was continued on his Protonix. And his Plavix was resumed, aspirin was discontinued.  Acute metabolic  encephalopathy: Of unclear etiology that he did remain afebrile he had a mild leukocytosis which is trending down, urine culture was inconclusive, chest x-ray showed no acute findings, CT scan of the head showed no acute findings. Physical therapy evaluated the patient and recommended skilled nursing facility.  History of CVA: With a recent stroke in June 2021, he was previously on dual antiplatelet therapy now only on Lasix. He will continue Lipitor.  Chronic diastolic heart failure/pulmonary hypertension: No changes made to his medication he appears euvolemic.  Chronic kidney disease stage IIIa: His creatinine remained at baseline.  Essential hypertension: No changes made to his medication.  Diabetes mellitus type 2: Continue Metformin no changes made to his medication.  Chronic thrombocytopenia: Likely due to iron deficiency, will need to follow-up with PCP.  A repeat CBC to see improvement.  Alcohol abuse: He was started on thiamine and folate no signs of withdrawal.  Severe dysphagia: Speech evaluated him recommended dysphagia 1 diet.  Goals of care Due to his multiple comorbidities and poor prognosis Palliative Care was consulted will follow up at the facility as an outpatient.    Discharge Instructions  Discharge Instructions    Diet - low sodium heart healthy   Complete by: As directed    Increase activity slowly   Complete by: As directed      Allergies as of 10/07/2019   No Known Allergies     Medication List    STOP taking these medications   aspirin 325 MG EC tablet     TAKE these medications   acetaminophen 325 MG tablet Commonly known as: TYLENOL Take 650 mg by mouth every 8 (eight) hours.  amLODipine 10 MG tablet Commonly known as: NORVASC Take 1 tablet (10 mg total) by mouth daily.   ascorbic acid 500 MG tablet Commonly known as: VITAMIN C Take 500 mg by mouth 2 (two) times daily.   atorvastatin 80 MG tablet Commonly known as:  LIPITOR Take 1 tablet (80 mg total) by mouth daily.   clopidogrel 75 MG tablet Commonly known as: PLAVIX Take 1 tablet (75 mg total) by mouth daily. STOP TAKING AFTER 3 MONTHS AND CONTINUE ASPIRIN ALONE   feeding supplement (ENSURE ENLIVE) Liqd Take 237 mLs by mouth 3 (three) times daily between meals.   ferrous sulfate 325 (65 FE) MG tablet Take 1 tablet (325 mg total) by mouth 3 (three) times daily with meals. What changed: Another medication with the same name was removed. Continue taking this medication, and follow the directions you see here.   folic acid 1 MG tablet Commonly known as: FOLVITE Take 1 tablet (1 mg total) by mouth daily.   insulin lispro 100 UNIT/ML injection Commonly known as: HUMALOG Inject 2-10 Units into the skin 3 (three) times daily before meals. Per sliding scale, If blood sugar is: 151-200:2 units 201-250:4 units 251-300:6 units 301-350:8 units 351-400:10 units If blood sugar is greater than 400, call NP/PA.   metFORMIN 500 MG tablet Commonly known as: GLUCOPHAGE Take 500 mg by mouth 2 (two) times daily with a meal.   metoprolol tartrate 25 MG tablet Commonly known as: LOPRESSOR Take 1 tablet (25 mg total) by mouth 2 (two) times daily.   multivitamin Tabs tablet Take 1 tablet by mouth daily.   pantoprazole 40 MG tablet Commonly known as: PROTONIX Take 1 tablet (40 mg total) by mouth daily.   Resource ThickenUp Clear Powd Take 120 g by mouth as needed (for nectar thick liquids).   silver sulfADIAZINE 1 % cream Commonly known as: SILVADENE Apply topically daily.   thiamine 100 MG tablet Take 1 tablet (100 mg total) by mouth daily.       Follow-up [place], [facility], NP.   Why: Please follow up in a week Contact information: [place] [address] [phone]              No Known Allergies  Consultations:  Gastroenterology   Procedures/Studies: CT HEAD WO  CONTRAST  Result Date: 09/30/2019 CLINICAL DATA:  Encephalopathy EXAM: CT HEAD WITHOUT CONTRAST TECHNIQUE: Contiguous axial images were obtained from the base of the skull through the vertex without intravenous contrast. COMPARISON:  08/11/2019 FINDINGS: Brain: Progression of encephalomalacia at the site of recent left MCA territory infarct with some sparing of the anterior left temporal lobe. No acute hemorrhage. Possible subacute to chronic petechial blood products in the subcortical left frontal lobe. Other dense cortical areas are likely indicative of laminar necrosis. Right hemisphere is normal. No midline shift or other mass effect. Vascular: Mild carotid atherosclerosis Skull: Normal Sinuses/Orbits: Clear sinuses.  Normal orbits. Other: None IMPRESSION: 1. No acute intracranial abnormality. 2. Progression of encephalomalacia at the site of recent left MCA territory infarct. Subacute to chronic petechial blood products in the subcortical left frontal lobe. Electronically Signed   By: Ulyses Jarred M.D.   On: 09/30/2019 19:47   CT ABDOMEN PELVIS W CONTRAST  Result Date: 09/30/2019 CLINICAL DATA:  Gastrointestinal hemorrhage EXAM: CT ABDOMEN AND PELVIS WITH CONTRAST TECHNIQUE: Multidetector CT imaging of the abdomen and pelvis was performed using the standard protocol following bolus administration of intravenous contrast. CONTRAST:  181mL OMNIPAQUE IOHEXOL 300 MG/ML  SOLN COMPARISON:  None. FINDINGS: Lower chest: Mild bibasilar atelectasis. Left ventricular hypertrophy. Mild resultant global cardiomegaly. Hepatobiliary: Liver unremarkable.  Gallbladder unremarkable. Pancreas: Unremarkable. Spleen: Status post splenectomy. Tiny residual splenules noted within the left subdiaphragmatic region. Adrenals/Urinary Tract: Adrenal glands are unremarkable. Multiple simple cortical cysts are seen within the kidneys bilaterally. The kidneys are otherwise unremarkable. The bladder is unremarkable. Stomach/Bowel: The  stomach is unremarkable. The small bowel is unremarkable. Moderate stool seen throughout the colon and rectal vault, without evidence of obstruction. The large bowel is otherwise unremarkable. Appendix normal. No free intraperitoneal gas or fluid. Vascular/Lymphatic: Retroaortic left renal vein. Moderate atherosclerotic calcification within the abdominal aorta without evidence of aneurysm. No pathologic abdominal or pelvic adenopathy. Reproductive: Prostate gland and seminal vesicles are unremarkable. Other: Rectum unremarkable.  Tiny fat containing umbilical hernia Musculoskeletal: No acute bone abnormality. Degenerative changes are seen within the lumbar spine. Multiple healed left rib fractures are noted. Multiple healed right rib fractures are noted. IMPRESSION: No definite source identified for the patient's reported gastrointestinal hemorrhage. Incidental findings as noted above. Aortic Atherosclerosis (ICD10-I70.0). Electronically Signed   By: Fidela Salisbury MD   On: 09/30/2019 20:10   DG Chest Port 1 View  Result Date: 09/30/2019 CLINICAL DATA:  Acute encephalopathy. EXAM: PORTABLE CHEST 1 VIEW COMPARISON:  August 17, 2019. FINDINGS: Stable cardiomegaly. No pneumothorax or pleural effusion is noted. Decreased bibasilar opacities are noted suggesting improving atelectasis or pneumonia. Old left rib fractures are noted. IMPRESSION: Decreased bibasilar opacities are noted suggesting improving atelectasis or pneumonia. Electronically Signed   By: Marijo Conception M.D.   On: 09/30/2019 08:12    Subjective: No new complaints.  Discharge Exam: Vitals:   10/07/19 0359 10/07/19 0753  BP: (!) 161/78 (!) 181/74  Pulse: 62 85  Resp: 18 20  Temp: 98.1 F (36.7 C) 98.4 F (36.9 C)  SpO2: 97% 97%   Vitals:   10/06/19 1935 10/07/19 0110 10/07/19 0359 10/07/19 0753  BP: (!) 150/68  (!) 161/78 (!) 181/74  Pulse: 79  62 85  Resp: 16  18 20   Temp: 98.9 F (37.2 C)  98.1 F (36.7 C) 98.4 F (36.9 C)   TempSrc: Oral  Oral Oral  SpO2: 98%  97% 97%  Weight:  90.3 kg    Height:        General: Pt is alert, awake, not in acute distress Cardiovascular: RRR, S1/S2 +, no rubs, no gallops Respiratory: CTA bilaterally, no wheezing, no rhonchi Abdominal: Soft, NT, ND, bowel sounds + Extremities: no edema, no cyanosis    The results of significant diagnostics from this hospitalization (including imaging, microbiology, ancillary and laboratory) are listed below for reference.     Microbiology: Recent Results (from the past 240 hour(s))  SARS Coronavirus 2 by RT PCR (hospital order, performed in [facility] hospital lab) Nasopharyngeal Nasopharyngeal Swab     Status: None   Collection Time: 09/28/19  8:51 PM   Specimen: Nasopharyngeal Swab  Result Value Ref Range Status   SARS Coronavirus 2 NEGATIVE NEGATIVE Final    Comment: (NOTE) SARS-CoV-2 target nucleic acids are NOT DETECTED.  The SARS-CoV-2 RNA is generally detectable in upper and lower respiratory specimens during the acute phase of infection. The lowest concentration of SARS-CoV-2 viral copies this assay can detect is 250 copies / mL. A negative result does not preclude SARS-CoV-2 infection and should not be used as the sole basis for treatment or other patient management decisions.  A negative result may occur with improper specimen collection / handling, submission of specimen other than nasopharyngeal swab, presence of viral mutation(s) within the areas targeted by this assay, and inadequate number of viral copies (<250 copies / mL). A negative result must be combined with clinical observations, patient history, and epidemiological information.  Fact Sheet for Patients:   StrictlyIdeas.no  Fact Sheet for Healthcare Providers: BankingDealers.co.za  This test is not yet approved or  cleared by the [place] FDA and has been authorized for detection and/or diagnosis of  SARS-CoV-2 by FDA under an Emergency Use Authorization (EUA).  This EUA will remain in effect (meaning this test can be used) for the duration of the COVID-19 declaration under Section 564(b)(1) of the Act, 21 U.S.C. section 360bbb-3(b)(1), unless the authorization is terminated or revoked sooner.  Performed at [facility], [place] [address], [place], [place] [address]   Culture, Urine     Status: Abnormal   Collection Time: 09/30/19 12:56 PM   Specimen: Urine, Random  Result Value Ref Range Status   Specimen Description URINE, RANDOM  Final   Special Requests   Final    NONE Performed at [facility], [place] [address], [place],  [address]    Culture MULTIPLE SPECIES PRESENT, SUGGEST RECOLLECTION (A)  Final   Report Status 10/01/2019 FINAL  Final  SARS CORONAVIRUS 2 (TAT 6-24 HRS) Nasopharyngeal Nasopharyngeal Swab     Status: None   Collection Time: 10/05/19  4:58 PM   Specimen: Nasopharyngeal Swab  Result Value Ref Range Status   SARS Coronavirus 2 NEGATIVE NEGATIVE Final    Comment: (NOTE) SARS-CoV-2 target nucleic acids are NOT DETECTED.  The SARS-CoV-2 RNA is generally detectable in upper and lower respiratory specimens during the acute phase of infection. Negative results do not preclude SARS-CoV-2 infection, do not rule out co-infections with other pathogens, and should not be used as the sole basis for treatment or other patient management decisions. Negative results must be combined with clinical observations, patient history, and epidemiological information. The expected result is Negative.  Fact Sheet for Patients: SugarRoll.be  Fact Sheet for Healthcare Providers: https://www.woods-mathews.com/  This test is not yet approved or cleared by the [place] FDA and  has been authorized for detection and/or diagnosis of SARS-CoV-2 by FDA under an Emergency Use Authorization (EUA). This EUA will remain  in  effect (meaning this test can be used) for the duration of the COVID-19 declaration under Se ction 564(b)(1) of the Act, 21 U.S.C. section 360bbb-3(b)(1), unless the authorization is terminated or revoked sooner.  Performed at [facility], [place] [address] [place],  [address]      Labs: BNP (last 3 results) No results for input(s): BNP in the last 8760 hours. Basic Metabolic Panel: Recent Labs  Lab 10/01/19 0722 10/02/19 0728 10/03/19 0636 10/04/19 0507 10/06/19 0617  NA 139 140 142 144 146*  K 3.9 3.5 4.1 4.0 4.3  CL 106 107 111 112* 112*  CO2 21* 21* 21* 23 27  GLUCOSE 111* 125* 97 105* 108*  BUN 10 15 14 15 13   CREATININE 1.20 1.32* 1.29* 1.31* 1.03  CALCIUM 9.8 9.4 9.3 9.1 9.4   Liver Function Tests: No results for input(s): AST, ALT, ALKPHOS, BILITOT, PROT, ALBUMIN in the last 168 hours. No results for input(s): LIPASE, AMYLASE in the last 168 hours. No results for input(s): AMMONIA in the last 168 hours. CBC: Recent Labs  Lab 10/02/19 0728 10/03/19 0636  10/04/19 0507 10/05/19 0418 10/06/19 0617  WBC 12.4* 13.2* 16.8* 10.2 12.9*  NEUTROABS 8.0* 8.7* 12.2* 6.4 7.2  HGB 9.0* 9.1* 9.0* 8.6* 9.4*  HCT 29.3* 30.1* 30.5* 29.1* 32.7*  MCV 76.9* 78.8* 80.9 81.7 82.4  PLT 602* 580* 578* 579* 537*   Cardiac Enzymes: No results for input(s): CKTOTAL, CKMB, CKMBINDEX, TROPONINI in the last 168 hours. BNP: Invalid input(s): POCBNP CBG: Recent Labs  Lab 10/06/19 1115 10/06/19 1647 10/06/19 1740 10/06/19 2113 10/07/19 0614  GLUCAP 161* 143* 384* 128* 101*   D-Dimer No results for input(s): DDIMER in the last 72 hours. Hgb A1c No results for input(s): HGBA1C in the last 72 hours. Lipid Profile No results for input(s): CHOL, HDL, LDLCALC, TRIG, CHOLHDL, LDLDIRECT in the last 72 hours. Thyroid function studies No results for input(s): TSH, T4TOTAL, T3FREE, THYROIDAB in the last 72 hours.  Invalid input(s): FREET3 Anemia work up No results for  input(s): VITAMINB12, FOLATE, FERRITIN, TIBC, IRON, RETICCTPCT in the last 72 hours. Urinalysis    Component Value Date/Time   COLORURINE YELLOW 09/30/2019 1005   APPEARANCEUR CLEAR 09/30/2019 1005   APPEARANCEUR Clear 06/11/2011 2046   LABSPEC 1.014 09/30/2019 1005   LABSPEC 1.025 06/11/2011 2046   PHURINE 7.0 09/30/2019 1005   GLUCOSEU NEGATIVE 09/30/2019 1005   GLUCOSEU Negative 06/11/2011 2046   HGBUR NEGATIVE 09/30/2019 1005   BILIRUBINUR NEGATIVE 09/30/2019 1005   BILIRUBINUR Negative 06/11/2011 2046   [place] 09/30/2019 1005   PROTEINUR 30 (A) 09/30/2019 1005   NITRITE NEGATIVE 09/30/2019 1005   LEUKOCYTESUR NEGATIVE 09/30/2019 1005   LEUKOCYTESUR Negative 06/11/2011 2046   Sepsis Labs Invalid input(s): PROCALCITONIN,  WBC,  LACTICIDVEN Microbiology Recent Results (from the past 240 hour(s))  SARS Coronavirus 2 by RT PCR (hospital order, performed in [facility] hospital lab) Nasopharyngeal Nasopharyngeal Swab     Status: None   Collection Time: 09/28/19  8:51 PM   Specimen: Nasopharyngeal Swab  Result Value Ref Range Status   SARS Coronavirus 2 NEGATIVE NEGATIVE Final    Comment: (NOTE) SARS-CoV-2 target nucleic acids are NOT DETECTED.  The SARS-CoV-2 RNA is generally detectable in upper and lower respiratory specimens during the acute phase of infection. The lowest concentration of SARS-CoV-2 viral copies this assay can detect is 250 copies / mL. A negative result does not preclude SARS-CoV-2 infection and should not be used as the sole basis for treatment or other patient management decisions.  A negative result may occur with improper specimen collection / handling, submission of specimen other than nasopharyngeal swab, presence of viral mutation(s) within the areas targeted by this assay, and inadequate number of viral copies (<250 copies / mL). A negative result must be combined with clinical observations, patient history, and epidemiological  information.  Fact Sheet for Patients:   StrictlyIdeas.no  Fact Sheet for Healthcare Providers: BankingDealers.co.za  This test is not yet approved or  cleared by the [place] FDA and has been authorized for detection and/or diagnosis of SARS-CoV-2 by FDA under an Emergency Use Authorization (EUA).  This EUA will remain in effect (meaning this test can be used) for the duration of the COVID-19 declaration under Section 564(b)(1) of the Act, 21 U.S.C. section 360bbb-3(b)(1), unless the authorization is terminated or revoked sooner.  Performed at [facility], [place] [address] [place], [place] [address]   Culture, Urine     Status: Abnormal   Collection Time: 09/30/19 12:56 PM   Specimen: Urine, Random  Result Value Ref Range Status  Specimen Description URINE, RANDOM  Final   Special Requests   Final    NONE Performed at [facility], [place] [address] [place], [place] [address]    Culture MULTIPLE SPECIES PRESENT, SUGGEST RECOLLECTION (A)  Final   Report Status 10/01/2019 FINAL  Final  SARS CORONAVIRUS 2 (TAT 6-24 HRS) Nasopharyngeal Nasopharyngeal Swab     Status: None   Collection Time: 10/05/19  4:58 PM   Specimen: Nasopharyngeal Swab  Result Value Ref Range Status   SARS Coronavirus 2 NEGATIVE NEGATIVE Final    Comment: (NOTE) SARS-CoV-2 target nucleic acids are NOT DETECTED.  The SARS-CoV-2 RNA is generally detectable in upper and lower respiratory specimens during the acute phase of infection. Negative results do not preclude SARS-CoV-2 infection, do not rule out co-infections with other pathogens, and should not be used as the sole basis for treatment or other patient management decisions. Negative results must be combined with clinical observations, patient history, and epidemiological information. The expected result is Negative.  Fact Sheet for  Patients: SugarRoll.be  Fact Sheet for Healthcare Providers: https://www.woods-mathews.com/  This test is not yet approved or cleared by the [place] FDA and  has been authorized for detection and/or diagnosis of SARS-CoV-2 by FDA under an Emergency Use Authorization (EUA). This EUA will remain  in effect (meaning this test can be used) for the duration of the COVID-19 declaration under Se ction 564(b)(1) of the Act, 21 U.S.C. section 360bbb-3(b)(1), unless the authorization is terminated or revoked sooner.  Performed at [facility], [place] [address], [place], [place] [address]      Time coordinating discharge: Over 30 minutes  SIGNED:   Charlynne Cousins, MD  [facility] 10/07/2019, 10:06 AM Pager   If 7PM-7AM, please contact night-coverage [email or web address] Password [email or web address]

## 2019-10-13 ENCOUNTER — Other Ambulatory Visit: Payer: Self-pay

## 2019-10-13 ENCOUNTER — Non-Acute Institutional Stay: Payer: Medicare Other | Admitting: Adult Health Nurse Practitioner

## 2019-10-13 DIAGNOSIS — Z8673 Personal history of transient ischemic attack (TIA), and cerebral infarction without residual deficits: Secondary | ICD-10-CM

## 2019-10-13 DIAGNOSIS — Z515 Encounter for palliative care: Secondary | ICD-10-CM

## 2019-10-13 NOTE — Progress Notes (Signed)
[facility] Pall[facility] Consult Note Telephone: [phone]  Fax: [phone]  PATIENT NAME: Malik [name] DOB: [birth year] MRN: [id number]  PRIMARY CARE PROVIDER:   [facility] [place], NP  REFERRING PROVIDER:  Cletus Gash NP  RESPONSIBLE PARTY:   Oluwatobiloba Martin, son [phone]     RECOMMENDATIONS and PLAN:  1.  Advanced care planning.  Patient is full code.  Attempted to call son.  Unable to leave message due to VM box is full  2.  CVA.  Patient had CVA 08/10/2019.  Patient has resultant right sided deficits and expressive aphasia.  Patient is at SNF working with rehab.  Patient does require assistance with standing and is bed and wheelchair bound.  Requires assistance with ADLs.  Patient has 0/5 strength in right hand.  Patient appears to understand what is being said and understands his situation.  He is unable to express what he wants to say.  He is tearful today.  Offered emotional support.  Patient on Plavix.  Continue supportive care at facility and PT/OT as ordered  3.  Anemia.  Patient was in hospital 7/28 through 10/07/2019.  Was sent to hospital by PCP due to hemoglobin of 6.4.  Endoscopy showed angiodysplasia of small bowel without bleeding.  This was cauterized and patient started on Protonix.  Continue monitoring for bleeding and further follow-up and recommendations by PCP.  4.  Nutritional status.  Staff reports that patient eats well.  Patient has had a small increase in weight.  On 09/27/2019 weight 195.2 pounds on 10/07/2019 patient weighed 198.7 pounds.  No reports of N/V/D, constipation.  Patient has not had any falls, infection since being back at facility.  No concerns reported by staff today.  Denies fever, shortness of breath, cough, wounds, edema.  Palliative will continue to monitor for symptom management/decline and make recommendations as needed.  We will continue to attempt to reach son via phone.  I spent 30  minutes providing this consultation,  from 9:00 to 9:30 including time spent with patient, chart review, provider coordination, documentation. More than 50% of the time in this consultation was spent coordinating communication.   HISTORY OF PRESENT ILLNESS:  Malik [name] is a 74 y.o. 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 Palliative Care was asked to help address goals of care.   CODE STATUS: full code  PPS: 30% HOSPICE ELIGIBILITY/DIAGNOSIS: TBD  PHYSICAL EXAM:   General: NAD, frail appearing Cardiovascular: regular rate and rhythm Pulmonary: clear ant fields Abdomen: soft, nontender, + bowel sounds GU: no suprapubic tenderness Extremities: no edema, no joint deformities Skin: no rashes on exposed skin Neurological: Weakness; expressive aphasia  PAST MEDICAL HISTORY:  Past Medical History:  Diagnosis Date   Angina of effort ([place])    Chronic diastolic CHF (congestive heart failure) ([facility])    CVA (cerebral vascular accident) ([place]) 08/10/2019   right sided weakness   Diabetes ([place])    Diabetes mellitus without complication ([place])    Dilated cardiomyopathy ([place])    Heart disease    High blood pressure    Hypertension    LVH (left ventricular hypertrophy) due to hypertensive disease    Moderate mitral insufficiency    Neuropathy    with LE weakness   Sleep apnea    TBI (traumatic brain injury) ([place])     SOCIAL HX:  Social History   Tobacco Use  Smoking status: Former Smoker    Packs/day: 1.50    Years: 30.00    Pack years: 45.00    Types: Cigarettes   Smokeless tobacco: Never Used  Substance Use Topics   Alcohol use: Yes    ALLERGIES: No Known Allergies   PERTINENT MEDICATIONS:  Outpatient Encounter Medications as of 10/13/2019  Medication Sig   acetaminophen (TYLENOL) 325 MG tablet Take 650 mg by mouth every 8  (eight) hours.   amLODipine (NORVASC) 10 MG tablet Take 1 tablet (10 mg total) by mouth daily.   ascorbic acid (VITAMIN C) 500 MG tablet Take 500 mg by mouth 2 (two) times daily.   atorvastatin (LIPITOR) 80 MG tablet Take 1 tablet (80 mg total) by mouth daily. (Patient not taking: Reported on 09/28/2019)   clopidogrel (PLAVIX) 75 MG tablet Take 1 tablet (75 mg total) by mouth daily. STOP TAKING AFTER 3 MONTHS AND CONTINUE ASPIRIN ALONE   feeding supplement, ENSURE ENLIVE, (ENSURE ENLIVE) LIQD Take 237 mLs by mouth 3 (three) times daily between meals.   ferrous sulfate 325 (65 FE) MG tablet Take 1 tablet (325 mg total) by mouth 3 (three) times daily with meals. (Patient not taking: Reported on 9/83/3825)   folic acid (FOLVITE) 1 MG tablet Take 1 tablet (1 mg total) by mouth daily.   insulin lispro (HUMALOG) 100 UNIT/ML injection Inject 2-10 Units into the skin 3 (three) times daily before meals. Per sliding scale, If blood sugar is: 151-200:2 units 201-250:4 units 251-300:6 units 301-350:8 units 351-400:10 units If blood sugar is greater than 400, call NP/PA.   Maltodextrin-Xanthan Gum (RESOURCE THICKENUP CLEAR) POWD Take 120 g by mouth as needed (for nectar thick liquids). (Patient not taking: Reported on 09/28/2019)   metFORMIN (GLUCOPHAGE) 500 MG tablet Take 500 mg by mouth 2 (two) times daily with a meal.   metoprolol tartrate (LOPRESSOR) 25 MG tablet Take 1 tablet (25 mg total) by mouth 2 (two) times daily.   multivitamin (PROSIGHT) TABS tablet Take 1 tablet by mouth daily. (Patient not taking: Reported on 09/28/2019)   pantoprazole (PROTONIX) 40 MG tablet Take 1 tablet (40 mg total) by mouth daily.   silver sulfADIAZINE (SILVADENE) 1 % cream Apply topically daily. (Patient not taking: Reported on 09/28/2019)   thiamine 100 MG tablet Take 1 tablet (100 mg total) by mouth daily.   No facility-administered encounter medications on file as of 10/13/2019.      Malik Grasmick Jenetta Downer,  NP

## 2019-10-15 ENCOUNTER — Other Ambulatory Visit: Payer: Medicare Other

## 2019-10-28 ENCOUNTER — Inpatient Hospital Stay: Payer: Self-pay | Admitting: Adult Health

## 2019-10-28 ENCOUNTER — Ambulatory Visit (INDEPENDENT_AMBULATORY_CARE_PROVIDER_SITE_OTHER): Payer: Medicare Other | Admitting: Adult Health

## 2019-10-28 ENCOUNTER — Encounter: Payer: Self-pay | Admitting: Adult Health

## 2019-10-28 VITALS — BP 161/79 | HR 58

## 2019-10-28 DIAGNOSIS — I10 Essential (primary) hypertension: Secondary | ICD-10-CM | POA: Diagnosis not present

## 2019-10-28 DIAGNOSIS — E119 Type 2 diabetes mellitus without complications: Secondary | ICD-10-CM

## 2019-10-28 DIAGNOSIS — I63412 Cerebral infarction due to embolism of left middle cerebral artery: Secondary | ICD-10-CM

## 2019-10-28 DIAGNOSIS — E785 Hyperlipidemia, unspecified: Secondary | ICD-10-CM | POA: Diagnosis not present

## 2019-10-28 DIAGNOSIS — Z794 Long term (current) use of insulin: Secondary | ICD-10-CM

## 2019-10-28 NOTE — Progress Notes (Signed)
[facility] [address] [place]. [place] [address] [phone]       HOSPITAL FOLLOW UP NOTE  [name] Date of Birth:  [birth year] Medical Record Number:  [id number]   Reason for Referral:  hospital stroke follow up    SUBJECTIVE:   CHIEF COMPLAINT:  Chief Complaint  Patient presents with  . Hospitalization Follow-up    HFU after stroke.  Continues to reside at facility with severe deficits   . treatment room    With facility employee    HPI:   Malik [name] a 74 y.o.malewith history of HTN, DM, HLD, heavy alcoholwho had sudden onset aphasia, L gaze preference and R hemiplegia on 08/10/2019.EKG showed AF en route. High BP SBP 230s treated with labetalol. NIHSS 25. Stroke work-up showed left MCA infarct s/p TPA and attempted IR of L X7/F4 occlusion, embolic secondary to unknown source.  Repeat imaging showed large evolving left MCA infarct with hemorrhagic transformation without discrete hematoma as well as left ACA infarct and ACA/PCA watershed territory infarct.  Recommended DAPT for 3 months and aspirin alone.  Consideration of loop recorder outpatient based on further recovery.  History of HTN on multiple antihypertensives stabilized during admission and recommended long-term BP goal 130-150 given left M2 occlusion.  History of HLD on atorvastatin 40 mg daily with LDL 127 and increased to 80 mg daily.  Controlled DM with A1c 5.4.  Other stroke risk factors include iron deficiency anemia, AKI on CKD, current EtOH abuse (level 68) placed on B1, FA and MVI during admission, former tobacco use, obesity, CAD, OS EF A, chronic diastolic CHF and dilated cardiomyopathy with LVH.  Other active problems include history of TBI, multinodular enlarged thyroid gland on CTA, hyperkalemia, and thrombocytosis.  Residual deficits of dysphagia (Dys 1, NTL), gaze preference, global aphasia, right visual peripheral impairment, right facial droop, and right  hemiplegia.  Evaluated by therapies and discharged to SNF for ongoing therapy needs.  Stroke: L MCA infarct s/p tPA and attempted IR of L F4/E3 occlusion, embolic secondary to unknown source   Code Stroke CT head No acute abnormality. ASPECTS 10.   CTA head proximal to mid L M2 occlusion.Mild B ICA atherosclerosis.  CTA neck B ICA bifurcation and bulbs atherosclerosis. L VA origin atherosclerosis. 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 [place] recommended  Cerebral angio L M3 superior branch occlusion w/ subocclusive filling defect.   Post IR CT Minimal contrast extravasation L M3 branch  CT 6/2 progression of L MCA infarct, hypodensity L middle frontal gyrus extending into L parietal and L occipital lobe L sylvian fissure SAH.  MRI Large evolving L MCA infarct w/ hemorrhagic transformation w/o discrete hematoma. Also L ACA infarct and ACA/PCA watershed territory. No significant mass effect.  MRA Motion. Proximal L M2 branch occlusion  2D EchoEF 50-55%. No source of embolus. B atrial mildly dilated.  LE venous doppler no DVT  May consider loop recorder if further improves at next neuro clinic visit.  [id number]  HgbA1c5.4  No antithromboticprior to admission, on ASA 325and plavix DAPT for 3 months and then ASA alone.  Therapy recommendations: SNF   Disposition: SNF   Today, 10/28/2019, Malik [name] is being seen for hospital follow-up.  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  Endoscopy showed angiodysplasia of  the small bowel without bleeding.  Discharged on Plavix but held aspirin.  Due to multiple comorbidities and poor prognosis, palliative care referral to follow-up at facility  He remains on Plavix without additional bleeding or bruising Remains on atorvastatin 80 mg daily Blood pressure today 161/79.  Review of facility note, blood pressures have been elevated typically ranging 140-160s/70s.  Currently on amlodipine and metoprolol Glucose levels elevated typically ranging 130-300s HTN, HLD and DM are currently managed by facility       ROS:   Unable to obtain due to aphasia    PMH:  Past Medical History:  Diagnosis Date  . Angina of effort ([place])   . Chronic diastolic CHF (congestive heart failure) ([place])   . CVA (cerebral vascular accident) ([place]) 08/10/2019   right sided weakness  . Diabetes ([place])   . Diabetes mellitus without complication ([place])   . Dilated cardiomyopathy ([place])   . Heart disease   . High blood pressure   . Hypertension   . 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 Moderate mitral insufficiency   . Neuropathy    with LE weakness  . Sleep apnea   . TBI (traumatic brain injury) ([place])     PSH:  Past Surgical History:  Procedure Laterality Date  . BACK SURGERY    . ESOPHAGOGASTRODUODENOSCOPY (EGD) WITH PROPOFOL N/A 10/02/2019   Procedure: ESOPHAGOGASTRODUODENOSCOPY (EGD) WITH PROPOFOL;  Surgeon: Irene Shipper, MD;  Location: [facility] ENDOSCOPY;  Service: Endoscopy;  Laterality: N/A;  . IR CT HEAD LTD  08/10/2019  . IR PERCUTANEOUS ART THROMBECTOMY/INFUSION INTRACRANIAL INC DIAG ANGIO  08/10/2019  . RADIOLOGY WITH ANESTHESIA N/A 08/10/2019   Procedure: IR WITH ANESTHESIA -CODE STROKE;  Surgeon: Radiologist, Medication, MD;  Location: [place];  Service: Radiology;  Laterality: N/A;  . 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 Marital status: Single    Spouse name: Not on file  . Number of children: Not on file  . Years of education: Not on file  . Highest education level: Not on file  Occupational History  . Not on file  Tobacco Use  . Smoking status: Former Smoker    Packs/day: 1.50    Years: 30.00    Pack years: 45.00    Types: Cigarettes  . Smokeless tobacco: Never Used  Vaping Use  . Vaping Use: Never used  Substance and Sexual Activity  . Alcohol use: Yes  . Drug use: No  . Sexual activity: Not on file  Other Topics Concern  . Not on file  Social History Narrative   ** Merged History Encounter **       Social Determinants of Health   Financial Resource Strain:   . Difficulty of Paying Living Expenses: Not on file  Food Insecurity:   . Worried About Charity fundraiser in the Last Year: Not on file  . Ran Out of Food in the Last Year: Not on file  Transportation Needs:   . Lack of Transportation (Medical): Not on file  . Lack of Transportation (Non-Medical): Not on file  Physical Activity:   . Days of Exercise per Week: Not  on file  . Minutes of Exercise per Session: Not on file  Stress:   . Feeling of Stress : Not on file  Social Connections:   . Frequency of Communication with Friends and Family: Not on file  . Frequency of Social Gatherings with Friends and Family: Not on file  . Attends Religious Services: Not on file  . Active Member of Clubs or Organizations: Not on file  . Attends Archivist Meetings: Not on file  . Marital Status: Not on file  Intimate Partner Violence:   . Fear of Current or Ex-Partner: Not on file  . Emotionally Abused: Not on file  . Physically Abused: Not on file  . Sexually Abused: Not on file    Family History:  Family History  Problem Relation Age of Onset  . Hypertension Mother   . Hypertension Other     Medications:   Current Outpatient  Medications on File Prior to Visit  Medication Sig Dispense Refill  . acetaminophen (TYLENOL) 325 MG tablet Take 650 mg by mouth every 8 (eight) hours.    Marland Kitchen amLODipine (NORVASC) 10 MG tablet Take 10 mg by mouth daily.    Marland Kitchen ascorbic acid (VITAMIN C) 500 MG tablet Take 500 mg by mouth 2 (two) times daily.    Marland Kitchen atorvastatin (LIPITOR) 80 MG tablet Take 80 mg by mouth daily.    . clopidogrel (PLAVIX) 75 MG tablet Take 1 tablet (75 mg total) by mouth daily. STOP TAKING AFTER 3 MONTHS AND CONTINUE ASPIRIN ALONE    . feeding supplement, ENSURE ENLIVE, (ENSURE ENLIVE) LIQD Take 237 mLs by mouth 3 (three) times daily between meals. 237 mL 12  . insulin lispro (HUMALOG) 100 UNIT/ML injection Inject 2-10 Units into the skin 3 (three) times daily before meals. Per sliding scale, If blood sugar is: 151-200:2 units 201-250:4 units 251-300:6 units 301-350:8 units 351-400:10 units If blood sugar is greater than 400, call NP/PA.    . Maltodextrin-Xanthan Gum (RESOURCE THICKENUP CLEAR) POWD Take 120 g by mouth as needed (for nectar thick liquids).    . metFORMIN (GLUCOPHAGE) 500 MG tablet Take 500 mg by mouth 2 (two) times daily with a meal.    . metoprolol tartrate (LOPRESSOR) 25 MG tablet Take 1 tablet (25 mg total) by mouth 2 (two) times daily.    . pantoprazole (PROTONIX) 40 MG tablet Take 1 tablet (40 mg total) by mouth daily. 30 tablet 0  . thiamine 100 MG tablet Take 1 tablet (100 mg total) by mouth daily. 30 tablet 0   No current facility-administered medications on file prior to visit.    Allergies:  No Known Allergies    OBJECTIVE:  Physical Exam  Vitals:   10/28/19 0913  BP: (!) 161/79  Pulse: (!) 58   There is no height or weight on file to calculate BMI. No exam data present  General: well developed, well nourished,  elderly African-American male, seated, in no evident distress Neck: supple with no carotid or supraclavicular bruits Cardiovascular: regular rate and rhythm, no  murmurs Vascular:  Normal pulses all extremities   Neurologic Exam Mental Status: Awake and fully alert.   Moderate to severe aphasia expressive > receptive.  Unable to assess orientation.  Difficulty having patient fully participate/cooperation in exam.  Mood and affect appropriate.  Cranial Nerves: Fundoscopic exam not cooperative.  Pupils equal, briskly reactive to light. Extraocular movements no evidence of nystagmus but difficulty fully tracking towards right.  Right-sided neglect.  Visual fields blink  to threat on left but not right. Hearing intact. Facial sensation intact.  Right facial droop.   Motor:  RUE: 0/5 RLE: 1/5 proximal and 0/5 distal Full strength left upper and lower extremity Sensory.:  Difficulty fully assessing but responsive to painful stimuli Coordination: Rapid alternating movements normal on left side. Finger-to-nose and heel-to-shin unable to test due to patient difficulty understanding Gait and Station: Deferred as patient nonambulatory Reflexes: Brisk on right; 1+on left. Toes downgoing.     NIHSS 19 (ED presentation 25) 1a.  Level of consciousness 0 1b. LOC questions 2 1c. LOC commands 2 2.  Best gaze 1 3.  Visual 2 4.  Facial palsy 1 5a.  Motor arm-left 0 5b.  Motor arm-right 4 6a.  Motor leg-left 0 6b.  Motor leg-right 3 7.  Limb ataxia 0 8.  Sensory 0 9.  Best language 2 10.  Dysarthria 0 11.  Extinction and inattention 2  Modified Rankin  4      ASSESSMENT: Malik [name] is a 74 y.o. year old male presented with sudden onset aphasia, left gaze preference and right hemiplegia on 08/10/2019 with stroke work-up revealing left MCA infarct s/p TPA and attempted IR of L W1/U2 occlusion, embolic secondary to unknown source.  Questionable A. fib by EMS and recommended consideration of loop recorder outpatient based on recovery.  Vascular risk factors include HTN, HLD, DM, heavy EtOH use, iron deficiency anemia, history of tobacco use, CAD, OSA, CHF  and dilated cardiomyopathy with LVH.      PLAN:  1. Left MCA stroke:  a. Residual deficit: Right hemiparesis with neglect, right peripheral visual deficit bilaterally, severe aphasia, likely cognitive impairment, gaze palsy and dysphagia. i. Continue PT/OT/SLP at facility but low suspicion for substantial improvement b. Due to extent of residual deficits and recent admission for GI bleed, not a candidate for anticoagulation therefore will hold off on further cardiac evaluation such as placement.  Will further evaluate at follow-up visit c. Continue clopidogrel 75 mg daily  and atorvastatin 80 mg daily for secondary stroke prevention -ongoing prescription obtain through facility/PCP d. Close PCP follow up for aggressive stroke risk factor management  2. Uncontrolled HTN:  a. BP goal <130/90.  Elevated today.   b. Needs stricter blood pressure management currently managed by facility 3. HLD:  a. LDL goal <70. Recent LDL 127.  b. Continue atorvastatin 80 mg daily c. Managed by facility 4. DMII:  a. A1c goal<7.0. Recent A1c 5.4.  b. Managed by facility 5. EtOH use: a. Obviously current cessation in facility b. Continue folic acid and thiamine 6. CAD, CHF and cardiomyopathy: a. Routine follow-up with cardiology    Follow up in 3 or call earlier if needed   I spent 50 minutes of face-to-face and non-face-to-face time with patient and facility worker.  This included previsit chart review, lab review, study review, order entry, electronic health record documentation, and provided facility worker education regarding patient's recent stroke, extensive residual deficits, importance of managing stroke risk factors and answered all questions to satisfaction   Frann Rider, [facility]  [facility] [address] [place], [place] [address]  Phone [phone] Fax [phone] Note: This document was prepared with digital dictation and possible smart phrase  technology. Any transcriptional errors that result from this process are unintentional.

## 2019-10-28 NOTE — Patient Instructions (Signed)
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       Followup in the future with me in 3 months or call earlier if needed       Thank you for coming to see [place] at [facility]. I hope we have been able to provide you high quality care today.  You may receive a patient satisfaction survey over the next few weeks. We would appreciate your feedback and comments so that we may continue to improve ourselves and the health of our patients.

## 2019-10-29 ENCOUNTER — Ambulatory Visit
Admission: RE | Admit: 2019-10-29 | Discharge: 2019-10-29 | Disposition: A | Discharge status: Home / Self Care | Payer: Medicare Other | Source: Ambulatory Visit | Attending: Internal Medicine | Admitting: Internal Medicine

## 2019-10-29 DIAGNOSIS — E041 Nontoxic single thyroid nodule: Secondary | ICD-10-CM

## 2019-10-29 NOTE — Progress Notes (Signed)
I agree with the above plan 

## 2019-11-02 ENCOUNTER — Telehealth: Payer: Self-pay | Admitting: Adult Health Nurse Practitioner

## 2019-11-02 NOTE — Telephone Encounter (Signed)
Spoke with patient's son.  Plan is to bring his father back home. Does have Medicaid and can apply for [facility] services upon discharge.  Does state that his nephew will be staying with his father.  Discussed getting a hospital bed for the home.  In agreement to continue palliative services while at facility. Lajoyce Tamura K. Olena Heckle NP

## 2019-11-10 ENCOUNTER — Other Ambulatory Visit: Payer: Self-pay

## 2019-11-10 ENCOUNTER — Non-Acute Institutional Stay: Payer: Medicare Other | Admitting: Adult Health Nurse Practitioner

## 2019-11-10 DIAGNOSIS — Z515 Encounter for palliative care: Secondary | ICD-10-CM

## 2019-11-10 DIAGNOSIS — Z8673 Personal history of transient ischemic attack (TIA), and cerebral infarction without residual deficits: Secondary | ICD-10-CM

## 2019-11-10 NOTE — Progress Notes (Signed)
[facility] Palliative Care Consult Note Telephone: [phone]  Fax: [phone]  PATIENT NAME: Malik [name] DOB: 04-Sep-1945 MRN: [id number]  PRIMARY CARE PROVIDER:   [facility], [place], NP  REFERRING PROVIDER: Cletus Gash NP  RESPONSIBLE PARTY:   Malik [name], son [phone]     RECOMMENDATIONS and PLAN:  1.  Advanced care planning.  Patient is full code.  Spoke with son to update on visit. Son wants to be able to bring his father home and understands that he will need help in the home.  Will have to work with SW at facility to make sure PCS services through [facility] upon discharge.  Trying to line up family member to stay in the home with his father but will also need someone to be in the home while family member is at work.  Have given him Museum/gallery curator as a resource for additional help in the home.    2.  Functional status.  Patient is able to get around in a wheelchair.  Is able to assist with transfers.  Has right sided weakness s/p CVA.  Patient requires one person assist with ADLs.  Continues to have expressive aphasia. Patient eats well with no reported weight loss.  Continues to work with therapy at facility.  Continue to work with therapy as ordered.  Will need further therapy upon discharge.    No concerns reported by staff today.  Denies fever, shortness of breath, cough, wounds, edema.  Palliative will continue to monitor for symptom management/decline and make recommendations as needed.  Will follow up in 2-4 weeks and as needed.   I spent 30 minutes providing this consultation,  from 9:40 to 10:10. More than 50% of the time in this consultation was spent coordinating communication.   HISTORY OF PRESENT ILLNESS:  Malik [name] is a 74 y.o. 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  Palliative Care was asked to help address goals of care.   CODE STATUS: full code  PPS: 30% HOSPICE ELIGIBILITY/DIAGNOSIS: TBD  PHYSICAL EXAM:  HR 58  O2 97% on RA General: NAD, frail appearing Cardiovascular: regular rate and rhythm Pulmonary: clear ant fields Abdomen: soft, nontender, + bowel sounds GU: no suprapubic tenderness Extremities: no edema, no joint deformities Skin: no rashes on exposed skin Neurological: Weakness; expressive aphasia  PAST MEDICAL HISTORY:  Past Medical History:  Diagnosis Date  . Angina of effort ([place])   . Chronic diastolic CHF (congestive heart failure) ([place])   . CVA (cerebral vascular accident) ([place]) 08/10/2019   right sided weakness  . Diabetes ([place])   . Diabetes mellitus without complication ([place])   . Dilated cardiomyopathy ([place])   . Heart disease   . High blood pressure   . Hypertension   . 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 Moderate mitral insufficiency   . Neuropathy    with LE weakness  . Sleep apnea   . TBI (traumatic brain injury) ([place])     SOCIAL HX:  Social History   Tobacco Use  . Smoking status: Former Smoker    Packs/day: 1.50    Years: 30.00    Pack years: 45.00    Types: Cigarettes  . Smokeless tobacco: Never Used  Substance Use Topics  . Alcohol use: Yes    ALLERGIES: No Known Allergies   PERTINENT MEDICATIONS:  Outpatient Encounter Medications  as of 75/03/2019  Medication Sig  . acetaminophen (TYLENOL) 325 MG tablet Take 650 mg by mouth every 8 (eight) hours.  Marland Kitchen amLODipine (NORVASC) 10 MG tablet Take 10 mg by mouth daily.  Marland Kitchen ascorbic acid (VITAMIN C) 500 MG tablet Take 500 mg by mouth 2 (two) times daily.  Marland Kitchen atorvastatin (LIPITOR) 80 MG tablet Take 80 mg by mouth daily.  . clopidogrel (PLAVIX) 75 MG tablet Take 1 tablet (75 mg total) by mouth daily. STOP TAKING AFTER 3 MONTHS AND CONTINUE ASPIRIN ALONE  . feeding supplement, ENSURE ENLIVE, (ENSURE ENLIVE) LIQD Take 237 mLs by  mouth 3 (three) times daily between meals.  . insulin lispro (HUMALOG) 100 UNIT/ML injection Inject 2-10 Units into the skin 3 (three) times daily before meals. Per sliding scale, If blood sugar is: 151-200:2 units 201-250:4 units 251-300:6 units 301-350:8 units 351-400:10 units If blood sugar is greater than 400, call NP/PA.  . Maltodextrin-Xanthan Gum (RESOURCE THICKENUP CLEAR) POWD Take 120 g by mouth as needed (for nectar thick liquids).  . metFORMIN (GLUCOPHAGE) 500 MG tablet Take 500 mg by mouth 2 (two) times daily with a meal.  . metoprolol tartrate (LOPRESSOR) 25 MG tablet Take 1 tablet (25 mg total) by mouth 2 (two) times daily.  . pantoprazole (PROTONIX) 40 MG tablet Take 1 tablet (40 mg total) by mouth daily.  Marland Kitchen thiamine 100 MG tablet Take 1 tablet (100 mg total) by mouth daily.   No facility-administered encounter medications on file as of 75/03/2019.     Sherwin Hollingshed Jenetta Downer, NP

## 2019-11-17 ENCOUNTER — Other Ambulatory Visit: Payer: Self-pay

## 2019-11-17 NOTE — Patient Outreach (Signed)
[facility]) Care Management  11/17/2019  Malik [name] [birth year] [id number]  Telephone outreach to patient to obtain mRS was successfully completed. MRS=5  [place] Management Assistant [phone]

## 2020-02-02 ENCOUNTER — Ambulatory Visit: Payer: Medicare Other | Admitting: Adult Health

## 2020-02-16 ENCOUNTER — Ambulatory Visit: Payer: Medicare Other | Admitting: Adult Health

## 2020-02-21 ENCOUNTER — Encounter: Payer: Self-pay | Admitting: *Deleted

## 2020-02-21 ENCOUNTER — Other Ambulatory Visit: Payer: Self-pay

## 2020-02-21 ENCOUNTER — Emergency Department
Admission: EM | Admit: 2020-02-21 | Discharge: 2020-02-21 | Disposition: A | Discharge status: Home / Self Care | Payer: Medicare Other | Attending: Student in an Organized Health Care Education/Training Program | Admitting: Student in an Organized Health Care Education/Training Program

## 2020-02-21 DIAGNOSIS — Z5321 Procedure and treatment not carried out due to patient leaving prior to being seen by health care provider: Secondary | ICD-10-CM | POA: Diagnosis not present

## 2020-02-21 DIAGNOSIS — R531 Weakness: Secondary | ICD-10-CM | POA: Diagnosis not present

## 2020-02-21 DIAGNOSIS — R109 Unspecified abdominal pain: Secondary | ICD-10-CM | POA: Diagnosis present

## 2020-02-21 LAB — COMPREHENSIVE METABOLIC PANEL
ALT: 15 U/L (ref 0–44)
AST: 19 U/L (ref 15–41)
Albumin: 3.9 g/dL (ref 3.5–5.0)
Alkaline Phosphatase: 87 U/L (ref 38–126)
Anion gap: 12 (ref 5–15)
BUN: 27 mg/dL — ABNORMAL HIGH (ref 8–23)
CO2: 24 mmol/L (ref 22–32)
Calcium: 9.4 mg/dL (ref 8.9–10.3)
Chloride: 103 mmol/L (ref 98–111)
Creatinine, Ser: 1.23 mg/dL (ref 0.61–1.24)
GFR, Estimated: >60 mL/min (ref >60)
Glucose, Bld: 125 mg/dL — ABNORMAL HIGH (ref 70–99)
Potassium: 4.2 mmol/L (ref 3.5–5.1)
Sodium: 139 mmol/L (ref 135–145)
Total Bilirubin: 0.6 mg/dL (ref 0.3–1.2)
Total Protein: 8.4 g/dL — ABNORMAL HIGH (ref 6.5–8.1)

## 2020-02-21 LAB — CBC
HCT: 38.2 % — ABNORMAL LOW (ref 39.0–52.0)
Hemoglobin: 12.2 g/dL — ABNORMAL LOW (ref 13.0–17.0)
MCH: 25.7 pg — ABNORMAL LOW (ref 26.0–34.0)
MCHC: 31.9 g/dL (ref 30.0–36.0)
MCV: 80.6 fL (ref 80.0–100.0)
Platelets: 398 10*3/uL (ref 150–400)
RBC: 4.74 MIL/uL (ref 4.22–5.81)
RDW: 21.9 % — ABNORMAL HIGH (ref 11.5–15.5)
WBC: 10 10*3/uL (ref 4.0–10.5)
nRBC: 0 % (ref 0.0–0.2)

## 2020-02-21 LAB — LIPASE, BLOOD: Lipase: 54 U/L — ABNORMAL HIGH (ref 11–51)

## 2020-02-21 LAB — TROPONIN I (HIGH SENSITIVITY): Troponin I (High Sensitivity): 19 ng/L — ABNORMAL HIGH (ref <18)

## 2020-02-21 NOTE — ED Triage Notes (Signed)
First Nurse Note:  Arrives via [facility].  EMS called for abdominal pain.  Has history of CVA in the past with residual right sided weakness.  Per EMS report, patient also c/o Left side feeling odd today.  Patient is HTN.

## 2020-02-21 NOTE — ED Triage Notes (Signed)
Pt to ED with family reporting increased fatigue throughout the day and state pt has been "slumped over" for the entire day at home. Pt is reportedly acting like him self at this time. (hx of a stroke with right sided weakness and very little verbal communication).   Pt has not had a BM in two days. No fevers, vomiting or nausea reported.

## 2020-02-22 ENCOUNTER — Telehealth: Payer: Self-pay | Admitting: Emergency Medicine

## 2020-02-22 NOTE — Telephone Encounter (Signed)
Called patient due to left emergency department before provider exam to inquire about condition and follow up plans. Son says patient is doing fine now.  I esplained that troponin was sl elevated and that his pcp should review all labs done here.  He will call pcp.

## 2020-03-08 ENCOUNTER — Emergency Department (HOSPITAL_COMMUNITY)
Admission: EM | Admit: 2020-03-08 | Discharge: 2020-03-09 | Disposition: A | Discharge status: Home / Self Care | Payer: Medicare Other | Attending: Emergency Medicine | Admitting: Emergency Medicine

## 2020-03-08 ENCOUNTER — Other Ambulatory Visit: Payer: Self-pay

## 2020-03-08 ENCOUNTER — Emergency Department (HOSPITAL_COMMUNITY): Payer: Medicare Other

## 2020-03-08 ENCOUNTER — Encounter (HOSPITAL_COMMUNITY): Payer: Self-pay | Admitting: Emergency Medicine

## 2020-03-08 DIAGNOSIS — R6883 Chills (without fever): Secondary | ICD-10-CM | POA: Insufficient documentation

## 2020-03-08 DIAGNOSIS — Z5321 Procedure and treatment not carried out due to patient leaving prior to being seen by health care provider: Secondary | ICD-10-CM | POA: Insufficient documentation

## 2020-03-08 DIAGNOSIS — R5383 Other fatigue: Secondary | ICD-10-CM | POA: Diagnosis not present

## 2020-03-08 DIAGNOSIS — R509 Fever, unspecified: Secondary | ICD-10-CM | POA: Diagnosis present

## 2020-03-08 DIAGNOSIS — U071 COVID-19: Secondary | ICD-10-CM | POA: Insufficient documentation

## 2020-03-08 LAB — CBC WITH DIFFERENTIAL/PLATELET
Abs Immature Granulocytes: 0.04 10*3/uL (ref 0.00–0.07)
Basophils Absolute: 0.1 10*3/uL (ref 0.0–0.1)
Basophils Relative: 1 %
Eosinophils Absolute: 0.4 10*3/uL (ref 0.0–0.5)
Eosinophils Relative: 3 %
HCT: 36 % — ABNORMAL LOW (ref 39.0–52.0)
Hemoglobin: 11.3 g/dL — ABNORMAL LOW (ref 13.0–17.0)
Immature Granulocytes: 0 %
Lymphocytes Relative: 6 %
Lymphs Abs: 0.7 10*3/uL (ref 0.7–4.0)
MCH: 26 pg (ref 26.0–34.0)
MCHC: 31.4 g/dL (ref 30.0–36.0)
MCV: 82.9 fL (ref 80.0–100.0)
Monocytes Absolute: 1.1 10*3/uL — ABNORMAL HIGH (ref 0.1–1.0)
Monocytes Relative: 8 %
Neutro Abs: 10.8 10*3/uL — ABNORMAL HIGH (ref 1.7–7.7)
Neutrophils Relative %: 82 %
Platelets: 315 10*3/uL (ref 150–400)
RBC: 4.34 MIL/uL (ref 4.22–5.81)
RDW: 22 % — ABNORMAL HIGH (ref 11.5–15.5)
WBC: 13.1 10*3/uL — ABNORMAL HIGH (ref 4.0–10.5)
nRBC: 0 % (ref 0.0–0.2)

## 2020-03-08 LAB — BASIC METABOLIC PANEL
Anion gap: 10 (ref 5–15)
BUN: 16 mg/dL (ref 8–23)
CO2: 23 mmol/L (ref 22–32)
Calcium: 9 mg/dL (ref 8.9–10.3)
Chloride: 104 mmol/L (ref 98–111)
Creatinine, Ser: 1.19 mg/dL (ref 0.61–1.24)
GFR, Estimated: >60 mL/min (ref >60)
Glucose, Bld: 183 mg/dL — ABNORMAL HIGH (ref 70–99)
Potassium: 3.8 mmol/L (ref 3.5–5.1)
Sodium: 137 mmol/L (ref 135–145)

## 2020-03-08 MED ORDER — ACETAMINOPHEN 325 MG PO TABS
650.0000 mg | ORAL_TABLET | Freq: Once | ORAL | Status: AC
  Administered 2020-03-08: 23:00:00 650 mg via ORAL
  Filled 2020-03-08: qty 2

## 2020-03-08 NOTE — ED Triage Notes (Signed)
Patient reports fever with chills and fatigue today .

## 2020-03-09 LAB — RESP PANEL BY RT-PCR (FLU A&B, COVID) ARPGX2
Influenza A by PCR: NEGATIVE
Influenza B by PCR: NEGATIVE
SARS Coronavirus 2 by RT PCR: POSITIVE — AB

## 2020-03-09 NOTE — ED Notes (Signed)
Patients father states he will take patient to PCP in the morning since his the fever is reduced. States father is ready to go home. Advised patient to stay. LWBS

## 2020-03-16 ENCOUNTER — Other Ambulatory Visit: Payer: Self-pay

## 2020-03-16 ENCOUNTER — Emergency Department: Payer: Medicare Other

## 2020-03-16 ENCOUNTER — Inpatient Hospital Stay
Admission: EM | Admit: 2020-03-16 | Discharge: 2020-03-30 | DRG: 871 | Disposition: A | Discharge status: Skilled Nursing Facility | Payer: Medicare Other | Attending: Internal Medicine | Admitting: Internal Medicine

## 2020-03-16 DIAGNOSIS — R0902 Hypoxemia: Secondary | ICD-10-CM

## 2020-03-16 DIAGNOSIS — T380X5A Adverse effect of glucocorticoids and synthetic analogues, initial encounter: Secondary | ICD-10-CM | POA: Diagnosis not present

## 2020-03-16 DIAGNOSIS — F0390 Unspecified dementia without behavioral disturbance: Secondary | ICD-10-CM | POA: Diagnosis present

## 2020-03-16 DIAGNOSIS — R778 Other specified abnormalities of plasma proteins: Secondary | ICD-10-CM | POA: Diagnosis present

## 2020-03-16 DIAGNOSIS — G8190 Hemiplegia, unspecified affecting unspecified side: Secondary | ICD-10-CM

## 2020-03-16 DIAGNOSIS — R1310 Dysphagia, unspecified: Secondary | ICD-10-CM | POA: Diagnosis present

## 2020-03-16 DIAGNOSIS — R6520 Severe sepsis without septic shock: Secondary | ICD-10-CM | POA: Diagnosis present

## 2020-03-16 DIAGNOSIS — Z6827 Body mass index (BMI) 27.0-27.9, adult: Secondary | ICD-10-CM

## 2020-03-16 DIAGNOSIS — G4730 Sleep apnea, unspecified: Secondary | ICD-10-CM | POA: Diagnosis present

## 2020-03-16 DIAGNOSIS — Z87820 Personal history of traumatic brain injury: Secondary | ICD-10-CM | POA: Diagnosis not present

## 2020-03-16 DIAGNOSIS — A4189 Other specified sepsis: Secondary | ICD-10-CM | POA: Diagnosis present

## 2020-03-16 DIAGNOSIS — D631 Anemia in chronic kidney disease: Secondary | ICD-10-CM | POA: Diagnosis present

## 2020-03-16 DIAGNOSIS — E1165 Type 2 diabetes mellitus with hyperglycemia: Secondary | ICD-10-CM | POA: Diagnosis not present

## 2020-03-16 DIAGNOSIS — R4701 Aphasia: Secondary | ICD-10-CM | POA: Diagnosis not present

## 2020-03-16 DIAGNOSIS — J1282 Pneumonia due to coronavirus disease 2019: Secondary | ICD-10-CM | POA: Diagnosis not present

## 2020-03-16 DIAGNOSIS — E1122 Type 2 diabetes mellitus with diabetic chronic kidney disease: Secondary | ICD-10-CM | POA: Diagnosis present

## 2020-03-16 DIAGNOSIS — I10 Essential (primary) hypertension: Secondary | ICD-10-CM | POA: Diagnosis not present

## 2020-03-16 DIAGNOSIS — I639 Cerebral infarction, unspecified: Secondary | ICD-10-CM | POA: Diagnosis present

## 2020-03-16 DIAGNOSIS — Z794 Long term (current) use of insulin: Secondary | ICD-10-CM

## 2020-03-16 DIAGNOSIS — J9600 Acute respiratory failure, unspecified whether with hypoxia or hypercapnia: Secondary | ICD-10-CM

## 2020-03-16 DIAGNOSIS — Z7401 Bed confinement status: Secondary | ICD-10-CM

## 2020-03-16 DIAGNOSIS — I5032 Chronic diastolic (congestive) heart failure: Secondary | ICD-10-CM | POA: Diagnosis not present

## 2020-03-16 DIAGNOSIS — E11649 Type 2 diabetes mellitus with hypoglycemia without coma: Secondary | ICD-10-CM | POA: Diagnosis not present

## 2020-03-16 DIAGNOSIS — E119 Type 2 diabetes mellitus without complications: Secondary | ICD-10-CM

## 2020-03-16 DIAGNOSIS — E1140 Type 2 diabetes mellitus with diabetic neuropathy, unspecified: Secondary | ICD-10-CM | POA: Diagnosis present

## 2020-03-16 DIAGNOSIS — I130 Hypertensive heart and chronic kidney disease with heart failure and stage 1 through stage 4 chronic kidney disease, or unspecified chronic kidney disease: Secondary | ICD-10-CM | POA: Diagnosis present

## 2020-03-16 DIAGNOSIS — I69351 Hemiplegia and hemiparesis following cerebral infarction affecting right dominant side: Secondary | ICD-10-CM | POA: Diagnosis not present

## 2020-03-16 DIAGNOSIS — N1831 Chronic kidney disease, stage 3a: Secondary | ICD-10-CM | POA: Diagnosis present

## 2020-03-16 DIAGNOSIS — I69320 Aphasia following cerebral infarction: Secondary | ICD-10-CM | POA: Diagnosis not present

## 2020-03-16 DIAGNOSIS — E785 Hyperlipidemia, unspecified: Secondary | ICD-10-CM | POA: Diagnosis present

## 2020-03-16 DIAGNOSIS — E872 Acidosis, unspecified: Secondary | ICD-10-CM

## 2020-03-16 DIAGNOSIS — Z9081 Acquired absence of spleen: Secondary | ICD-10-CM

## 2020-03-16 DIAGNOSIS — Z8249 Family history of ischemic heart disease and other diseases of the circulatory system: Secondary | ICD-10-CM

## 2020-03-16 DIAGNOSIS — I420 Dilated cardiomyopathy: Secondary | ICD-10-CM | POA: Diagnosis present

## 2020-03-16 DIAGNOSIS — U071 COVID-19: Secondary | ICD-10-CM | POA: Diagnosis not present

## 2020-03-16 DIAGNOSIS — Z7984 Long term (current) use of oral hypoglycemic drugs: Secondary | ICD-10-CM

## 2020-03-16 DIAGNOSIS — Z79899 Other long term (current) drug therapy: Secondary | ICD-10-CM

## 2020-03-16 DIAGNOSIS — J9601 Acute respiratory failure with hypoxia: Secondary | ICD-10-CM | POA: Diagnosis present

## 2020-03-16 DIAGNOSIS — I69391 Dysphagia following cerebral infarction: Secondary | ICD-10-CM

## 2020-03-16 DIAGNOSIS — R7989 Other specified abnormal findings of blood chemistry: Secondary | ICD-10-CM

## 2020-03-16 DIAGNOSIS — Z87891 Personal history of nicotine dependence: Secondary | ICD-10-CM

## 2020-03-16 DIAGNOSIS — A419 Sepsis, unspecified organism: Secondary | ICD-10-CM

## 2020-03-16 DIAGNOSIS — Z8616 Personal history of COVID-19: Secondary | ICD-10-CM

## 2020-03-16 LAB — TROPONIN I (HIGH SENSITIVITY)
Troponin I (High Sensitivity): 169 ng/L (ref <18)
Troponin I (High Sensitivity): 129 ng/L (ref <18)
Troponin I (High Sensitivity): 188 ng/L (ref <18)

## 2020-03-16 LAB — COMPREHENSIVE METABOLIC PANEL
ALT: 21 U/L (ref 0–44)
AST: 54 U/L — ABNORMAL HIGH (ref 15–41)
Albumin: 3.4 g/dL — ABNORMAL LOW (ref 3.5–5.0)
Alkaline Phosphatase: 60 U/L (ref 38–126)
Anion gap: 13 (ref 5–15)
BUN: 15 mg/dL (ref 8–23)
CO2: 23 mmol/L (ref 22–32)
Calcium: 8.7 mg/dL — ABNORMAL LOW (ref 8.9–10.3)
Chloride: 105 mmol/L (ref 98–111)
Creatinine, Ser: 1.12 mg/dL (ref 0.61–1.24)
GFR, Estimated: >60 mL/min (ref >60)
Glucose, Bld: 251 mg/dL — ABNORMAL HIGH (ref 70–99)
Potassium: 4 mmol/L (ref 3.5–5.1)
Sodium: 141 mmol/L (ref 135–145)
Total Bilirubin: 0.8 mg/dL (ref 0.3–1.2)
Total Protein: 8 g/dL (ref 6.5–8.1)

## 2020-03-16 LAB — CBC WITH DIFFERENTIAL/PLATELET
Abs Immature Granulocytes: 0.04 10*3/uL (ref 0.00–0.07)
Basophils Absolute: 0 10*3/uL (ref 0.0–0.1)
Basophils Relative: 0 %
Eosinophils Absolute: 0 10*3/uL (ref 0.0–0.5)
Eosinophils Relative: 0 %
HCT: 38.8 % — ABNORMAL LOW (ref 39.0–52.0)
Hemoglobin: 12.6 g/dL — ABNORMAL LOW (ref 13.0–17.0)
Immature Granulocytes: 0 %
Lymphocytes Relative: 6 %
Lymphs Abs: 0.7 10*3/uL (ref 0.7–4.0)
MCH: 26.1 pg (ref 26.0–34.0)
MCHC: 32.5 g/dL (ref 30.0–36.0)
MCV: 80.3 fL (ref 80.0–100.0)
Monocytes Absolute: 0.6 10*3/uL (ref 0.1–1.0)
Monocytes Relative: 5 %
Neutro Abs: 9.6 10*3/uL — ABNORMAL HIGH (ref 1.7–7.7)
Neutrophils Relative %: 89 %
Platelets: 284 10*3/uL (ref 150–400)
RBC: 4.83 MIL/uL (ref 4.22–5.81)
RDW: 21.2 % — ABNORMAL HIGH (ref 11.5–15.5)
Smear Review: NORMAL
WBC: 10.8 10*3/uL — ABNORMAL HIGH (ref 4.0–10.5)
nRBC: 0 % (ref 0.0–0.2)

## 2020-03-16 LAB — LACTIC ACID, PLASMA
Lactic Acid, Venous: 3.9 mmol/L (ref 0.5–1.9)
Lactic Acid, Venous: 5.4 mmol/L (ref 0.5–1.9)
Lactic Acid, Venous: 1.7 mmol/L (ref 0.5–1.9)

## 2020-03-16 LAB — BLOOD GAS, ARTERIAL
Acid-Base Excess: 0.6 mmol/L (ref 0.0–2.0)
Bicarbonate: 24.3 mmol/L (ref 20.0–28.0)
FIO2: 1
O2 Saturation: 95.7 %
Patient temperature: 37
pCO2 arterial: 35 mmHg (ref 32.0–48.0)
pH, Arterial: 7.45 (ref 7.350–7.450)
pO2, Arterial: 76 mmHg — ABNORMAL LOW (ref 83.0–108.0)

## 2020-03-16 LAB — PROTIME-INR
INR: 1 (ref 0.8–1.2)
Prothrombin Time: 13 seconds (ref 11.4–15.2)

## 2020-03-16 LAB — CBG MONITORING, ED: Glucose-Capillary: 190 mg/dL — ABNORMAL HIGH (ref 70–99)

## 2020-03-16 LAB — PROCALCITONIN: Procalcitonin: 0.15 ng/mL

## 2020-03-16 LAB — D-DIMER, QUANTITATIVE: D-Dimer, Quant: 0.92 ug/mL-FEU — ABNORMAL HIGH (ref 0.00–0.50)

## 2020-03-16 LAB — APTT
aPTT: 33 seconds (ref 24–36)
aPTT: >160 seconds (ref 24–36)

## 2020-03-16 LAB — BRAIN NATRIURETIC PEPTIDE: B Natriuretic Peptide: 253.8 pg/mL — ABNORMAL HIGH (ref 0.0–100.0)

## 2020-03-16 MED ORDER — THIAMINE HCL 100 MG PO TABS
100.0000 mg | ORAL_TABLET | Freq: Every day | ORAL | Status: DC
  Administered 2020-03-16 – 2020-03-30 (×15): 100 mg via ORAL
  Filled 2020-03-16 (×15): qty 1

## 2020-03-16 MED ORDER — BARICITINIB 1 MG PO TABS
4.0000 mg | ORAL_TABLET | Freq: Every day | ORAL | Status: AC
  Administered 2020-03-16 – 2020-03-29 (×13): 4 mg via ORAL
  Filled 2020-03-16 (×9): qty 2
  Filled 2020-03-16: qty 4
  Filled 2020-03-16 (×6): qty 2

## 2020-03-16 MED ORDER — SODIUM CHLORIDE 0.9 % IV SOLN: 100.0000 mg | Freq: Every day | INTRAVENOUS | Status: DC

## 2020-03-16 MED ORDER — SODIUM CHLORIDE 0.9 % IV SOLN
100.0000 mg | Freq: Every day | INTRAVENOUS | Status: AC
  Administered 2020-03-17 – 2020-03-20 (×4): 100 mg via INTRAVENOUS
  Filled 2020-03-16 (×4): qty 100

## 2020-03-16 MED ORDER — METOPROLOL TARTRATE 25 MG PO TABS
25.0000 mg | ORAL_TABLET | Freq: Two times a day (BID) | ORAL | Status: DC
  Administered 2020-03-16 – 2020-03-29 (×27): 25 mg via ORAL
  Filled 2020-03-16 (×29): qty 1

## 2020-03-16 MED ORDER — LACTATED RINGERS IV BOLUS (SEPSIS)
1000.0000 mL | Freq: Once | INTRAVENOUS | Status: AC
  Administered 2020-03-16: 1000 mL via INTRAVENOUS

## 2020-03-16 MED ORDER — SODIUM CHLORIDE 0.9 % IV SOLN
200.0000 mg | Freq: Once | INTRAVENOUS | Status: AC
  Administered 2020-03-16: 200 mg via INTRAVENOUS
  Filled 2020-03-16: qty 200

## 2020-03-16 MED ORDER — SODIUM CHLORIDE 0.9 % IV SOLN: 200.0000 mg | Freq: Once | INTRAVENOUS | Status: DC

## 2020-03-16 MED ORDER — SODIUM CHLORIDE 0.9 % IV SOLN
500.0000 mg | Freq: Once | INTRAVENOUS | Status: AC
  Administered 2020-03-16: 500 mg via INTRAVENOUS
  Filled 2020-03-16: qty 500

## 2020-03-16 MED ORDER — SODIUM CHLORIDE 0.9 % IV SOLN
2.0000 g | Freq: Once | INTRAVENOUS | Status: AC
  Administered 2020-03-16: 2 g via INTRAVENOUS
  Filled 2020-03-16: qty 20

## 2020-03-16 MED ORDER — HEPARIN (PORCINE) 25000 UT/250ML-% IV SOLN
1200.0000 [IU]/h | INTRAVENOUS | Status: DC
  Administered 2020-03-16: 1200 [IU]/h via INTRAVENOUS
  Filled 2020-03-16: qty 250

## 2020-03-16 MED ORDER — HEPARIN BOLUS VIA INFUSION
4000.0000 [IU] | Freq: Once | INTRAVENOUS | Status: AC
  Administered 2020-03-16: 4000 [IU] via INTRAVENOUS
  Filled 2020-03-16: qty 4000

## 2020-03-16 MED ORDER — LACTATED RINGERS IV SOLN: INTRAVENOUS | Status: AC

## 2020-03-16 MED ORDER — LACTATED RINGERS IV SOLN: INTRAVENOUS | Status: DC

## 2020-03-16 MED ORDER — DEXAMETHASONE SODIUM PHOSPHATE 10 MG/ML IJ SOLN: 6.0000 mg | INTRAMUSCULAR | Status: DC

## 2020-03-16 MED ORDER — ATORVASTATIN CALCIUM 20 MG PO TABS
80.0000 mg | ORAL_TABLET | Freq: Every day | ORAL | Status: DC
  Administered 2020-03-16 – 2020-03-30 (×14): 80 mg via ORAL
  Filled 2020-03-16: qty 1
  Filled 2020-03-16 (×2): qty 4
  Filled 2020-03-16 (×2): qty 1
  Filled 2020-03-16: qty 4
  Filled 2020-03-16: qty 1
  Filled 2020-03-16 (×2): qty 4
  Filled 2020-03-16: qty 1
  Filled 2020-03-16 (×2): qty 4
  Filled 2020-03-16 (×2): qty 1

## 2020-03-16 MED ORDER — ACETAMINOPHEN 325 MG PO TABS
650.0000 mg | ORAL_TABLET | Freq: Four times a day (QID) | ORAL | Status: DC | PRN
  Administered 2020-03-19 – 2020-03-24 (×2): 650 mg via ORAL
  Filled 2020-03-16 (×4): qty 2

## 2020-03-16 MED ORDER — ASPIRIN 81 MG PO CHEW
324.0000 mg | CHEWABLE_TABLET | Freq: Once | ORAL | Status: AC
  Administered 2020-03-16: 324 mg via ORAL
  Filled 2020-03-16: qty 4

## 2020-03-16 MED ORDER — INSULIN ASPART 100 UNIT/ML ~~LOC~~ SOLN
0.0000 [IU] | Freq: Three times a day (TID) | SUBCUTANEOUS | Status: DC
  Administered 2020-03-16 – 2020-03-17 (×3): 3 [IU] via SUBCUTANEOUS
  Administered 2020-03-18: 5 [IU] via SUBCUTANEOUS
  Administered 2020-03-18: 8 [IU] via SUBCUTANEOUS
  Administered 2020-03-19: 5 [IU] via SUBCUTANEOUS
  Administered 2020-03-19: 3 [IU] via SUBCUTANEOUS
  Administered 2020-03-19 – 2020-03-20 (×3): 5 [IU] via SUBCUTANEOUS
  Administered 2020-03-20: 8 [IU] via SUBCUTANEOUS
  Administered 2020-03-21: 15 [IU] via SUBCUTANEOUS
  Administered 2020-03-21: 8 [IU] via SUBCUTANEOUS
  Administered 2020-03-22: 3 [IU] via SUBCUTANEOUS
  Administered 2020-03-22: 15 [IU] via SUBCUTANEOUS
  Administered 2020-03-22: 11 [IU] via SUBCUTANEOUS
  Administered 2020-03-23: 8 [IU] via SUBCUTANEOUS
  Administered 2020-03-23: 11 [IU] via SUBCUTANEOUS
  Administered 2020-03-24: 5 [IU] via SUBCUTANEOUS
  Administered 2020-03-24: 8 [IU] via SUBCUTANEOUS
  Administered 2020-03-25: 2 [IU] via SUBCUTANEOUS
  Administered 2020-03-25 – 2020-03-26 (×3): 3 [IU] via SUBCUTANEOUS
  Administered 2020-03-27: 5 [IU] via SUBCUTANEOUS
  Administered 2020-03-27: 8 [IU] via SUBCUTANEOUS
  Administered 2020-03-28: 5 [IU] via SUBCUTANEOUS
  Administered 2020-03-28 (×2): 2 [IU] via SUBCUTANEOUS
  Administered 2020-03-29 (×2): 5 [IU] via SUBCUTANEOUS
  Administered 2020-03-30: 15 [IU] via SUBCUTANEOUS
  Filled 2020-03-16 (×33): qty 1

## 2020-03-16 MED ORDER — PANTOPRAZOLE SODIUM 40 MG PO TBEC
40.0000 mg | DELAYED_RELEASE_TABLET | Freq: Every day | ORAL | Status: DC
Start: 2020-03-16 — End: 2020-03-30
  Administered 2020-03-16 – 2020-03-30 (×14): 40 mg via ORAL
  Filled 2020-03-16 (×14): qty 1

## 2020-03-16 MED ORDER — DEXAMETHASONE SODIUM PHOSPHATE 10 MG/ML IJ SOLN
6.0000 mg | Freq: Once | INTRAMUSCULAR | Status: AC
  Administered 2020-03-16: 6 mg via INTRAVENOUS
  Filled 2020-03-16: qty 1

## 2020-03-16 MED ORDER — FOLIC ACID 1 MG PO TABS
1.0000 mg | ORAL_TABLET | Freq: Every day | ORAL | Status: DC
  Administered 2020-03-16 – 2020-03-30 (×15): 1 mg via ORAL
  Filled 2020-03-16 (×15): qty 1

## 2020-03-16 NOTE — ED Notes (Signed)
Lab called to add on trop and d-dimer. Per lab they can't add on. Asked lab for assistance to come and collect blood draws

## 2020-03-16 NOTE — ED Notes (Signed)
Lab at bedside

## 2020-03-16 NOTE — Consult Note (Signed)
ANTICOAGULATION CONSULT NOTE - Consult  Pharmacy Consult for Heparin gtt Indication: chest pain/ACS ISO Covid  No Known Allergies  Patient Measurements: Height: 5\' 10"  (177.8 cm) Weight: 90.3 kg (199 lb 1.2 oz) IBW/kg (Calculated) : 73 Heparin Dosing Weight: 90.3 kg  Vital Signs: Temp: 98.3 F (36.8 C) (01/06 0946) Temp Source: Oral (01/06 0946) BP: 151/68 (01/06 1030) Pulse Rate: 87 (01/06 1030)  Labs: Recent Labs    03/16/20 0947  HGB 12.6*  HCT 38.8*  PLT 284  APTT 33  LABPROT 13.0  INR 1.0  CREATININE 1.12  TROPONINIHS 169*    Estimated Creatinine Clearance: 64.4 mL/min (by C-G formula based on SCr of 1.12 mg/dL).   Medications:  Heparin Dosing Weight: 90.3 kg No AC PTA and NKDA.  Assessment: 75 yo Male with h/o CVA (rt-side deficits/aphasia), TBI, HTN, HLD, DM, CHF & dilated CMP, with Covid + test ~1wk ago. No AC PTA. BL aPTT 33 Pharmacy consulted to dose heparin for ACS/NSTEMI ISO Covid infxn.  Hgb: 12.6> Plt: 284>   Date Time HL Comment   Goal of Therapy:  Heparin level 0.3-0.7 units/ml Monitor platelets by anticoagulation protocol: Yes   Plan:  Give 4000 units bolus x 1 Start heparin infusion at 1200 units/hr Check anti-Xa level in 8 hours and daily while on heparin Continue to monitor H&H and platelets  61 Cayden Rautio 03/16/2020,11:25 AM

## 2020-03-16 NOTE — ED Notes (Signed)
RT at bedside.

## 2020-03-16 NOTE — Consult Note (Signed)
Remdesivir - Pharmacy Brief Note   O:  ALT: 21 (03/16/20) CXR: Increased airspace opacities bilaterally as described consistent with the given clinical history SpO2: 92% on 15L from nonrebreather   A/P:  Remdesivir 200 mg IVPB once followed by 100 mg IVPB daily x 4 days.   Martyn Malay, [facility] 03/16/2020 2:03 PM

## 2020-03-16 NOTE — ED Provider Notes (Addendum)
[facility] Emergency Department Provider Note ____________________________________________   Event Date/Time   First MD Initiated Contact with Patient 03/16/20 [phone]     (approximate)  I have reviewed the triage vital signs and the nursing notes.  HISTORY  Chief Complaint Code Sepsis   HPI Malik [name] is a 75 y.o. malewho presents to the ED for evaluation of shortness of breath and hypoxia.  Chart review indicates history of CVA with right-sided deficits and aphasia, TBI, HTN, HLD, DM, CHF.  Patient reportedly tested positive for COVID-19 a week ago, confirmed on chart review from 12/29 respiratory panel through our ED.  Patient was not seen at that time by provider and left without being seen.  Patient presents from home due to shortness of breath and hypoxia.  SLP was reportedly working with the patient today, with a noted significant shortness of breath upon their arrival.  Checked pulse oximetry, noting sats of 78% on room air.  Patient was placed on nonrebreather by EMS and transported here.  History is limited by the patient's aphasia.   Past Medical History:  Diagnosis Date  . Angina of effort ([place])   . Chronic diastolic CHF (congestive heart failure) ([place])   . CVA (cerebral vascular accident) ([place]) 08/10/2019   right sided weakness  . Diabetes ([place])   . Diabetes mellitus without complication ([place])   . Dilated cardiomyopathy ([place])   . Heart disease   . High blood pressure   . Hypertension   . 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 Moderate mitral insufficiency   . Neuropathy    with LE weakness  . Sleep apnea   . TBI (traumatic brain injury) [facility])     Patient Active Problem List   Diagnosis Date Noted  . Goals of care, counseling/discussion   . Palliative care by specialist   . Acute anemia 09/28/2019  . History of CVA (cerebrovascular accident) 09/28/2019  . Hyperlipidemia LDL goal <70 08/24/2019   . Dysphagia due to recent cerebral infarction 08/24/2019  . Severe protein-calorie malnutrition ([place]) 08/24/2019  . Iron deficiency anemia 08/24/2019  . AKI (acute kidney injury) ([place]) 08/24/2019  . CKD (chronic kidney disease), stage IIIa 08/24/2019  . Alcohol abuse 08/24/2019  . Obesity 08/24/2019  . Multinodular thyroid 08/24/2019  . Pressure injury of skin 08/13/2019  . Embolic stroke ([place]) L MCA s/p tPA, unknown source 08/10/2019  . Thrombocytosis   . Acute lower UTI   . Labile blood pressure   . Hydrothorax   . Sleep disturbance   . Pleural effusion on left   . Hypertensive crisis   . Hypertension   . Leukocytosis   . Benign essential HTN   . Diabetes mellitus type 2 in nonobese (HCC)   . Acute blood loss anemia   . Trauma 05/27/2017  . TBI (traumatic brain injury) ([place])   . Essential hypertension   . Chronic diastolic congestive heart failure ([place])   . 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 Rib fractures 05/10/2017  . 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    Past Surgical History:  Procedure Laterality Date  . BACK SURGERY    . ESOPHAGOGASTRODUODENOSCOPY (EGD) WITH PROPOFOL N/A 10/02/2019   Procedure: ESOPHAGOGASTRODUODENOSCOPY (EGD) WITH PROPOFOL;  Surgeon: Irene Shipper, MD;  Location[facility] ENDOSCOPY;  Service: Endoscopy;  Laterality: N/A;  . IR CT HEAD LTD  08/10/2019  . IR PERCUTANEOUS ART THROMBECTOMY/INFUSION INTRACRANIAL INC DIAG ANGIO  08/10/2019  . RADIOLOGY WITH ANESTHESIA  N/A 08/10/2019   Procedure: IR WITH ANESTHESIA -CODE STROKE;  Surgeon: Radiologist, Medication, MD;  Location: [place];  Service: Radiology;  Laterality: N/A;  . SPLENECTOMY, TOTAL N/A 05/10/2017   Procedure: TRAUMA EXPLORATORY LAP FOR SPLENECTOMY;  Surgeon: Clovis Riley, MD;  Location: [place];  Service: General;  Laterality: N/A;    Prior to Admission medications   Medication Sig Start Date End Date Taking? Authorizing Provider  acetaminophen (TYLENOL) 325 MG tablet Take 650 mg by  mouth every 8 (eight) hours.    [provider]  amLODipine (NORVASC) 10 MG tablet Take 10 mg by mouth daily.    [provider]  ascorbic acid (VITAMIN C) 500 MG tablet Take 500 mg by mouth 2 (two) times daily.    [provider]  atorvastatin (LIPITOR) 80 MG tablet Take 80 mg by mouth daily.    [provider]  feeding supplement, ENSURE ENLIVE, (ENSURE ENLIVE) LIQD Take 237 mLs by mouth 3 (three) times daily between meals. 08/24/19   Donzetta Starch, NP  insulin lispro (HUMALOG) 100 UNIT/ML injection Inject 2-10 Units into the skin 3 (three) times daily before meals. Per sliding scale, If blood sugar is: 151-200:2 units 201-250:4 units 251-300:6 units 301-350:8 units 351-400:10 units If blood sugar is greater than 400, call NP/PA.    [provider]  Maltodextrin-Xanthan Gum (RESOURCE THICKENUP CLEAR) POWD Take 120 g by mouth as needed (for nectar thick liquids). 08/24/19   Donzetta Starch, NP  metFORMIN (GLUCOPHAGE) 500 MG tablet Take 500 mg by mouth 2 (two) times daily with a meal.    [provider]  metoprolol tartrate (LOPRESSOR) 25 MG tablet Take 1 tablet (25 mg total) by mouth 2 (two) times daily. 08/24/19   Donzetta Starch, NP  pantoprazole (PROTONIX) 40 MG tablet Take 1 tablet (40 mg total) by mouth daily. 06/07/17   Love, Ivan Anchors, PA-C  thiamine 100 MG tablet Take 1 tablet (100 mg total) by mouth daily. 06/06/17   Bary Leriche, PA-C    Allergies Patient has no known allergies.  Family History  Problem Relation Age of Onset  . Hypertension Mother   . Hypertension Other     Social History Social History   Tobacco Use  . Smoking status: Former Smoker    Packs/day: 1.50    Years: 30.00    Pack years: 45.00    Types: Cigarettes  . Smokeless tobacco: Never Used  Vaping Use  . Vaping Use: Never used  Substance Use Topics  . Alcohol use: Yes  . Drug use: No    Review of Systems  Unable to be accurately obtained due  to patient's aphasia. ____________________________________________   PHYSICAL EXAM:  VITAL SIGNS: Vitals:   03/16/20 1200 03/16/20 1230  BP: (!) 141/67 (!) 156/62  Pulse: 74 79  Resp: (!) 21 (!) 22  Temp:    SpO2: 96% 98%     Constitutional: Slumped in bed on nonrebreather.  Follows commands in all 4 extremities.. Eyes: Conjunctivae are normal. PERRL. EOMI. Head: Atraumatic. Nose: No congestion/rhinnorhea. Mouth/Throat: Mucous membranes are dry.  Oropharynx non-erythematous. Neck: No stridor. No cervical spine tenderness to palpation. Cardiovascular: Normal rate, regular rhythm. Grossly normal heart sounds.  Good peripheral circulation. Respiratory: Tachypneic to about 30.  Clear lungs throughout.  No focal features.  No retractions. Gastrointestinal: Soft , nondistended, nontender to palpation. No CVA tenderness. Musculoskeletal:   No joint effusions. No signs of acute trauma. Neurologic: Global aphasia at baseline.  No acute neurologic deficits are appreciated. Skin:  Skin is warm, dry and intact. No rash noted. Psychiatric: Mood and affect are normal. Speech and behavior are normal.  ____________________________________________   LABS (all labs ordered are listed, but only abnormal results are displayed)  Labs Reviewed  LACTIC ACID, PLASMA - Abnormal; Notable for the following components:      Result Value   Lactic Acid, Venous 3.9 (*)    All other components within normal limits  LACTIC ACID, PLASMA - Abnormal; Notable for the following components:   Lactic Acid, Venous 5.4 (*)    All other components within normal limits  COMPREHENSIVE METABOLIC PANEL - Abnormal; Notable for the following components:   Glucose, Bld 251 (*)    Calcium 8.7 (*)    Albumin 3.4 (*)    AST 54 (*)    All other components within normal limits  CBC WITH DIFFERENTIAL/PLATELET - Abnormal; Notable for the following components:   WBC 10.8 (*)    Hemoglobin 12.6 (*)    HCT 38.8 (*)    RDW  21.2 (*)    Neutro Abs 9.6 (*)    All other components within normal limits  BRAIN NATRIURETIC PEPTIDE - Abnormal; Notable for the following components:   B Natriuretic Peptide 253.8 (*)    All other components within normal limits  TROPONIN I (HIGH SENSITIVITY) - Abnormal; Notable for the following components:   Troponin I (High Sensitivity) 169 (*)    All other components within normal limits  TROPONIN I (HIGH SENSITIVITY) - Abnormal; Notable for the following components:   Troponin I (High Sensitivity) 129 (*)    All other components within normal limits  CULTURE, BLOOD (ROUTINE X 2)  CULTURE, BLOOD (ROUTINE X 2)  URINE CULTURE  PROTIME-INR  APTT  URINALYSIS, COMPLETE (UACMP) WITH MICROSCOPIC  PROCALCITONIN  APTT  HEPARIN LEVEL (UNFRACTIONATED)   ____________________________________________  12 Lead EKG  Sinus rhythm, rate of 90 bpm.  Normal axis.  Prolonged QTC of 513 ms.  Couple PVCs.  No evidence of acute ischemia. ____________________________________________  RADIOLOGY  ED MD interpretation: 1 view CXR reviewed by me with patchy multifocal infiltrates without discrete lobar filtration  Official radiology report(s): DG Chest Port 1 View  Result Date: 03/16/2020 CLINICAL DATA:  History of COVID-19 positivity with hypoxia EXAM: PORTABLE CHEST 1 VIEW COMPARISON:  03/08/2020 FINDINGS: Cardiac shadow is stable when compared with the prior exam. Lungs are well aerated bilaterally. Increased airspace opacities are noted throughout the right lung and primarily within the left lung base when compare with the prior exam consistent with the given clinical history. Healed rib fractures are noted bilaterally. No other focal abnormality is noted. IMPRESSION: Increased airspace opacities bilaterally as described consistent with the given clinical history. Electronically Signed   By: Alcide Clever M.D.   On: 03/16/2020 10:10   ____________________________________________   PROCEDURES and  INTERVENTIONS  Procedure(s) performed (including Critical Care):  .1-3 Lead EKG Interpretation Performed by: Delton Prairie, MD Authorized by: Delton Prairie, MD     Interpretation: normal     ECG rate:  76   ECG rate assessment: normal     Rhythm: sinus rhythm     Ectopy: PVCs     Conduction: normal   .Critical Care Performed by: Delton Prairie, MD Authorized by: Delton Prairie, MD   Critical care provider statement:    Critical care time (minutes):  45   Critical care was necessary to treat or prevent imminent or life-threatening deterioration of  the following conditions:  Respiratory failure   Critical care was time spent personally by me on the following activities:  Discussions with consultants, evaluation of patient's response to treatment, examination of patient, ordering and performing treatments and interventions, ordering and review of laboratory studies, ordering and review of radiographic studies, pulse oximetry, re-evaluation of patient's condition, obtaining history from patient or surrogate and review of old charts    Medications  lactated ringers infusion ( Intravenous New Bag/Given 03/16/20 1001)  heparin ADULT infusion 100 units/mL (25000 units/245mL) (1,200 Units/hr Intravenous New Bag/Given 03/16/20 1215)  cefTRIAXone (ROCEPHIN) 2 g in sodium chloride 0.9 % 100 mL IVPB (has no administration in time range)  azithromycin (ZITHROMAX) 500 mg in sodium chloride 0.9 % 250 mL IVPB (has no administration in time range)  lactated ringers bolus 1,000 mL (0 mLs Intravenous Stopped 03/16/20 1203)    And  lactated ringers bolus 1,000 mL (0 mLs Intravenous Stopped 03/16/20 1126)  dexamethasone (DECADRON) injection 6 mg (6 mg Intravenous Given 03/16/20 1048)  aspirin chewable tablet 324 mg (324 mg Oral Given 03/16/20 1136)  heparin bolus via infusion 4,000 Units (4,000 Units Intravenous Bolus from Bag 03/16/20 1216)    ____________________________________________   MDM / ED COURSE    75 year old male diagnosed with COVID-19 1 week ago presents to the ED with worsening respiratory status necessitating NRB and medical admission.  Patient hypoxic to the 70s on room air requiring nonrebreather, otherwise hemodynamically stable.  Exam without evidence of acute neurologic deficits on top of his chronic stroke deficits.  Tachypneic and in respiratory distress.  Improving clinical picture after supplemental oxygen achieved normoxia.  CXR with expected opacities in setting of COVID-19 without discrete lobar filtration.  Blood work with lactic acidosis and downtrending WBC count, cultures were drawn but no antibiotics provided yet.  We will send for procalcitonin to further assess infectious etiology of his symptoms that would require antibiotics.  Decadron provided due to his severity of COVID-19 illness.  Considering his respiratory distress and elevated troponin to 170, patient started on heparin drip to treat for NSTEMI.  Will admit to hospitalist medicine for further work-up and management.   Clinical Course as of 03/16/20 1251  Thu Mar 16, 2020  1053 Call from lab.  Lactic 3.9, trop 169 [DS]  1100 Daughter to the hallway outside the room.  We discussed patient's condition.  She reports that he is full code at this time.  She reports seeing him yesterday and his breathing was "okay."  Aphasic at baseline and does not converse.  Daughter reports his behavior is at his baseline. [DS]  1249 Worsening lactic.  We will antibiosis [DS]    Clinical Course User Index [DS] Vladimir Crofts, MD    ____________________________________________   FINAL CLINICAL IMPRESSION(S) / ED DIAGNOSES  Final diagnoses:  COVID-19  Hypoxia  Lactic acid acidosis  Elevated troponin     ED Discharge Orders    None       Lendora Keys   Note:  This document was prepared using Dragon voice recognition software and may include unintentional dictation errors.   Vladimir Crofts, MD 03/16/20 1228     Vladimir Crofts, MD 03/16/20 [phone]

## 2020-03-16 NOTE — H&P (Addendum)
History and Physical    Malik [name] [id number] DOB: 08/04/1945 DOA: 03/16/2020  PCP: [facility], [place], NP  Patient coming from: home via ems   Chief Complaint: hypoxia  HPI: Malik [name] is a 75 y.o. 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  Presented to our ED on 12/20 with fever/chills and fatigue. Covid tested, result was positive. Patient left AMA prior to being seen by a provider.  History obtained from patient's son as pt is aphasic.  Son reports that for the past week condition has waxed and waned. At times normal self in terms of mentation. Baseline is bedbound, responds with yes/no to basic questions. At other times seems lethargic. Today SLP came for therapy and noticed patient to be lethargic. Pulse ox revealed o2 in the 70s so EMS was alerted.  No report of chest pain, productive cough, dysuria    ED Course:   Initial lactate 3.9. satting in upper 70s, normalized on non-rebreather. Given 2 L of fluids, also decadron. Initial troponin 169, ekg non-ischemic, heparin started. Repeat troponin 129. Repeat lactate 5.4, so abx given (ceftriaxone, azithromycin).   Review of Systems: As per HPI otherwise 10 point review of systems negative.    Past Medical History:  Diagnosis Date  . Angina of effort ([place])   . Chronic diastolic CHF (congestive heart failure) ([place])   . CVA (cerebral vascular accident) ([place]) 08/10/2019   right sided weakness  . Diabetes ([place])   . Diabetes mellitus without complication ([place])   . Dilated cardiomyopathy ([place])   . Heart disease   . High blood pressure   . Hypertension   . 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 Moderate mitral insufficiency   . Neuropathy    with LE weakness  . Sleep apnea   . TBI (traumatic brain  injury) [facility])     Past Surgical History:  Procedure Laterality Date  . BACK SURGERY    . ESOPHAGOGASTRODUODENOSCOPY (EGD) WITH PROPOFOL N/A 10/02/2019   Procedure: ESOPHAGOGASTRODUODENOSCOPY (EGD) WITH PROPOFOL;  Surgeon: Irene Shipper, MD;  Location: [facility] ENDOSCOPY;  Service: Endoscopy;  Laterality: N/A;  . IR CT HEAD LTD  08/10/2019  . IR PERCUTANEOUS ART THROMBECTOMY/INFUSION INTRACRANIAL INC DIAG ANGIO  08/10/2019  . RADIOLOGY WITH ANESTHESIA N/A 08/10/2019   Procedure: IR WITH ANESTHESIA -CODE STROKE;  Surgeon: Radiologist, Medication, MD;  Location: [facility];  Service: Radiology;  Laterality: N/A;  . 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;  Service: General;  Laterality: N/A;     reports that he has quit smoking. His smoking use included cigarettes. He has a 45.00 pack-year smoking history. He has never used smokeless tobacco. He reports current alcohol use. He reports that he does not use drugs.  No Known Allergies  Family History  Problem Relation Age of Onset  . Hypertension Mother   . Hypertension Other     Prior to Admission medications   Medication Sig Start Date End Date Taking? Authorizing Provider  acetaminophen (TYLENOL) 325 MG tablet Take 650 mg by mouth every 8 (eight) hours.    [provider]  amLODipine (NORVASC) 10 MG tablet Take 10 mg by mouth daily.    [provider]  ascorbic acid (VITAMIN C)  500 MG tablet Take 500 mg by mouth 2 (two) times daily.    [provider]  atorvastatin (LIPITOR) 80 MG tablet Take 80 mg by mouth daily.    [provider]  feeding supplement, ENSURE ENLIVE, (ENSURE ENLIVE) LIQD Take 237 mLs by mouth 3 (three) times daily between meals. 08/24/19   Donzetta Starch, NP  insulin lispro (HUMALOG) 100 UNIT/ML injection Inject 2-10 Units into the skin 3 (three) times daily before meals. Per sliding scale, If blood sugar  is: 151-200:2 units 201-250:4 units 251-300:6 units 301-350:8 units 351-400:10 units If blood sugar is greater than 400, call NP/PA.    [provider]  Maltodextrin-Xanthan Gum (RESOURCE THICKENUP CLEAR) POWD Take 120 g by mouth as needed (for nectar thick liquids). 08/24/19   Donzetta Starch, NP  metFORMIN (GLUCOPHAGE) 500 MG tablet Take 500 mg by mouth 2 (two) times daily with a meal.    [provider]  metoprolol tartrate (LOPRESSOR) 25 MG tablet Take 1 tablet (25 mg total) by mouth 2 (two) times daily. 08/24/19   Donzetta Starch, NP  pantoprazole (PROTONIX) 40 MG tablet Take 1 tablet (40 mg total) by mouth daily. 06/07/17   Love, Ivan Anchors, PA-C  thiamine 100 MG tablet Take 1 tablet (100 mg total) by mouth daily. 06/06/17   Bary Leriche, PA-C    Physical Exam: Vitals:   03/16/20 1230 03/16/20 1245 03/16/20 1300 03/16/20 1305  BP: (!) 156/62   (!) 145/62  Pulse: 79 74 71 71  Resp: (!) 22 20 19 20   Temp:      TempSrc:      SpO2: 98% 97% 99% 97%  Weight:      Height:        Constitutional: No acute distress Head: Atraumatic Eyes: Conjunctiva clear ENM: Moist mucous membranes.  Neck: Supple Respiratory: exp rhonchi throughout Cardiovascular: Regular rate and rhythm. Soft systolic murmur Abdomen: Non-tender, non-distended. No masses. No rebound or guarding. Positive bowel sounds. Musculoskeletal: No joint deformity upper and lower extremities. Normal ROM, no contractures. Decreased muscle tone.  Skin: No rashes Extremities: No peripheral edema. Palpable peripheral pulses. Neurologic: awake and alert. Responds unintelligably to questions. Left sided facial droop, subjective left sided weakness Psychiatric: unable to assess   Labs on Admission: I have personally reviewed following labs and imaging studies  CBC: Recent Labs  Lab 03/16/20 0947  WBC 10.8*  NEUTROABS 9.6*  HGB 12.6*  HCT 38.8*  MCV 80.3  PLT [id number]   Basic Metabolic Panel: Recent Labs   Lab 03/16/20 0947  NA 141  K 4.0  CL 105  CO2 23  GLUCOSE 251*  BUN 15  CREATININE 1.12  CALCIUM 8.7*   GFR: Estimated Creatinine Clearance: 64.4 mL/min (by C-G formula based on SCr of 1.12 mg/dL). Liver Function Tests: Recent Labs  Lab 03/16/20 0947  AST 54*  ALT 21  ALKPHOS 60  BILITOT 0.8  PROT 8.0  ALBUMIN 3.4*   No results for input(s): LIPASE, AMYLASE in the last 168 hours. No results for input(s): AMMONIA in the last 168 hours. Coagulation Profile: Recent Labs  Lab 03/16/20 0947  INR 1.0   Cardiac Enzymes: No results for input(s): CKTOTAL, CKMB, CKMBINDEX, TROPONINI in the last 168 hours. BNP (last 3 results) No results for input(s): PROBNP in the last 8760 hours. HbA1C: No results for input(s): HGBA1C in the last 72 hours. CBG: No results for input(s): GLUCAP in the last 168 hours. Lipid Profile: No results for  input(s): CHOL, HDL, LDLCALC, TRIG, CHOLHDL, LDLDIRECT in the last 72 hours. Thyroid Function Tests: No results for input(s): TSH, T4TOTAL, FREET4, T3FREE, THYROIDAB in the last 72 hours. Anemia Panel: No results for input(s): VITAMINB12, FOLATE, FERRITIN, TIBC, IRON, RETICCTPCT in the last 72 hours. Urine analysis:    Component Value Date/Time   COLORURINE YELLOW 09/30/2019 1005   APPEARANCEUR CLEAR 09/30/2019 1005   APPEARANCEUR Clear 06/11/2011 2046   LABSPEC 1.014 09/30/2019 1005   LABSPEC 1.025 06/11/2011 2046   PHURINE 7.0 09/30/2019 1005   GLUCOSEU NEGATIVE 09/30/2019 1005   GLUCOSEU Negative 06/11/2011 2046   HGBUR NEGATIVE 09/30/2019 1005   BILIRUBINUR NEGATIVE 09/30/2019 1005   BILIRUBINUR Negative 06/11/2011 2046   [place] 09/30/2019 1005   PROTEINUR 30 (A) 09/30/2019 1005   NITRITE NEGATIVE 09/30/2019 1005   LEUKOCYTESUR NEGATIVE 09/30/2019 1005   LEUKOCYTESUR Negative 06/11/2011 2046    Radiological Exams on Admission: DG Chest Port 1 View  Result Date: 03/16/2020 CLINICAL DATA:  History of COVID-19  positivity with hypoxia EXAM: PORTABLE CHEST 1 VIEW COMPARISON:  03/08/2020 FINDINGS: Cardiac shadow is stable when compared with the prior exam. Lungs are well aerated bilaterally. Increased airspace opacities are noted throughout the right lung and primarily within the left lung base when compare with the prior exam consistent with the given clinical history. Healed rib fractures are noted bilaterally. No other focal abnormality is noted. IMPRESSION: Increased airspace opacities bilaterally as described consistent with the given clinical history. Electronically Signed   By: Inez Catalina M.D.   On: 03/16/2020 10:10    EKG: Independently reviewed. 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 Hemodynamically stable. Hypoxia resolved with non-rebreather. Hx diastolic dysfunction, has received 2 L fluids. cxr w/ bilateral opacities - cont IV fluids @ 125 - repeat lactate - check abg - check d dimer, if markedly elevated will check cta - procalcitonin low (0.15) so holding on further abx for now, received ceftriaxone and azithromycin 1/6 - daily inflammatory markers, pulmonary toilet ordered - decadron, remdesivir, and baricitinab ordered - f/u influenza pcr - full code confirmed w/ son. Severity of patient's illness discussed w/ patient's son. - f/u blood/urine cultures - strict I/os  # Tropinemia Likely demand 2/2 above process. Down-trending troponin.  No ischemic changes on ekg. No known hx of cad but plenty of risk factors. EDP has started heparin  (hx GIB last year, though hgb today has essentially normalized) - f/u third troponin, if stable will d/c heparin (unless decision to pursue CTA in which case would consider until its results return) - tele  # HFpEF bnp elevated to 253, does not appear to be decompensated - monitor  # History CVA with resulting hemiplegia, aphasia - monitor for signs new or worsening CVA - cont atorvastatin. Previously on dapt, now on none, presumably 2/2 recent GIB - npo pending slp swallow eval and diet recs  # HTN Here bp mildly elevated - hold home amlodipine given septic picture, cont metoprolol  # T2DM Here glucose wnl - hold home metformin  and SSI, start SSI moderate  # Hx etoh abuse No recent heavy drinking reported - monitor for s/s withdrawal - folic acid, thiamine ordered  # prolonged qtc Of 513 - noted  # recent GI bleed Hospitalized last year, endoscopy showed angiodysplasia of small bowel. Here hgb in 12s, no report of bleeding - monitor  DVT prophylaxis: heparin therapeutic Code Status: full  Family Communication: son updated telephonically 1/6  Consults called: none    Status is: Inpatient  Remains inpatient appropriate because:Inpatient level of care appropriate due to severity of illness   Dispo: The patient is from: Home              Anticipated d/c is to: tbd              Anticipated d/c date is: > 3 days              Patient currently is not medically stable to d/c.        Silvano Bilis MD [facility] Pager [phone]  If 7PM-7AM, please contact night-coverage [email or web address] Password [email or web address]  03/16/2020, 1:46 PM

## 2020-03-16 NOTE — Sepsis Progress Note (Signed)
Notified provider of need to order antibiotics.   

## 2020-03-16 NOTE — ED Triage Notes (Addendum)
Pt here for CODE SEPSIS alert. Per EMS pt was dx with COVID a week ago. Pt's SLP was at pt's home and noticed he was SOB. Pulse ox placed on pt and O2 reading 78%. EMS arrived and placed pt on NRB 15L with O2 at 88092%, RR-40, Temp-100.5. Pt given 100 ml of fluids. CBG-333  Pt has hx of stroke and is at his baseline of aphasic. Pt able to follow commands.  MD at bedside

## 2020-03-16 NOTE — ED Notes (Signed)
Pharmacy to send up medications and verify,.

## 2020-03-16 NOTE — ED Notes (Signed)
Date and time results received: 03/16/20 1402 (use smartphrase ".now" to insert current time)  Test: PTT Critical Value: >160  Name of Provider Notified: Katrinka Blazing  Orders Received? Or Actions Taken?: Orders Received - See Orders for details

## 2020-03-16 NOTE — Consult Note (Signed)
CODE SEPSIS - PHARMACY COMMUNICATION  **Broad Spectrum Antibiotics should be administered within 1 hour of Sepsis diagnosis**  Time Code Sepsis Called/Page Received: 1251  Antibiotics Ordered: 1300  Time of 1st antibiotic administration: 1307  Additional action taken by pharmacy: n/a  If necessary, Name of Provider/Nurse Contacted: n/a  Martyn Malay ,PharmD Clinical Pharmacist  03/16/2020  1:25 PM

## 2020-03-16 NOTE — ED Notes (Signed)
Pt cleaned up and placed in clean brief, gown and linens placed on the bed. Pt pulled up in bed and given blanket.

## 2020-03-17 DIAGNOSIS — E119 Type 2 diabetes mellitus without complications: Secondary | ICD-10-CM

## 2020-03-17 DIAGNOSIS — I5032 Chronic diastolic (congestive) heart failure: Secondary | ICD-10-CM

## 2020-03-17 DIAGNOSIS — U071 COVID-19: Secondary | ICD-10-CM | POA: Diagnosis not present

## 2020-03-17 DIAGNOSIS — R4701 Aphasia: Secondary | ICD-10-CM

## 2020-03-17 LAB — COMPREHENSIVE METABOLIC PANEL
ALT: 17 U/L (ref 0–44)
AST: 41 U/L (ref 15–41)
Albumin: 2.6 g/dL — ABNORMAL LOW (ref 3.5–5.0)
Alkaline Phosphatase: 50 U/L (ref 38–126)
Anion gap: 8 (ref 5–15)
BUN: 16 mg/dL (ref 8–23)
CO2: 23 mmol/L (ref 22–32)
Calcium: 8.2 mg/dL — ABNORMAL LOW (ref 8.9–10.3)
Chloride: 111 mmol/L (ref 98–111)
Creatinine, Ser: 0.92 mg/dL (ref 0.61–1.24)
GFR, Estimated: >60 mL/min (ref >60)
Glucose, Bld: 177 mg/dL — ABNORMAL HIGH (ref 70–99)
Potassium: 4.2 mmol/L (ref 3.5–5.1)
Sodium: 142 mmol/L (ref 135–145)
Total Bilirubin: 0.7 mg/dL (ref 0.3–1.2)
Total Protein: 6.5 g/dL (ref 6.5–8.1)

## 2020-03-17 LAB — CBC WITH DIFFERENTIAL/PLATELET
Abs Immature Granulocytes: 0.02 10*3/uL (ref 0.00–0.07)
Basophils Absolute: 0 10*3/uL (ref 0.0–0.1)
Basophils Relative: 0 %
Eosinophils Absolute: 0 10*3/uL (ref 0.0–0.5)
Eosinophils Relative: 0 %
HCT: 33.4 % — ABNORMAL LOW (ref 39.0–52.0)
Hemoglobin: 11 g/dL — ABNORMAL LOW (ref 13.0–17.0)
Immature Granulocytes: 0 %
Lymphocytes Relative: 14 %
Lymphs Abs: 1.1 10*3/uL (ref 0.7–4.0)
MCH: 26.3 pg (ref 26.0–34.0)
MCHC: 32.9 g/dL (ref 30.0–36.0)
MCV: 79.7 fL — ABNORMAL LOW (ref 80.0–100.0)
Monocytes Absolute: 0.5 10*3/uL (ref 0.1–1.0)
Monocytes Relative: 6 %
Neutro Abs: 6.1 10*3/uL (ref 1.7–7.7)
Neutrophils Relative %: 80 %
Platelets: 290 10*3/uL (ref 150–400)
RBC: 4.19 MIL/uL — ABNORMAL LOW (ref 4.22–5.81)
RDW: 21.1 % — ABNORMAL HIGH (ref 11.5–15.5)
Smear Review: NORMAL
WBC: 7.7 10*3/uL (ref 4.0–10.5)
nRBC: 0 % (ref 0.0–0.2)

## 2020-03-17 LAB — D-DIMER, QUANTITATIVE: D-Dimer, Quant: 0.85 ug/mL-FEU — ABNORMAL HIGH (ref 0.00–0.50)

## 2020-03-17 LAB — FERRITIN: Ferritin: 147 ng/mL (ref 24–336)

## 2020-03-17 LAB — PROCALCITONIN: Procalcitonin: 0.17 ng/mL

## 2020-03-17 LAB — GLUCOSE, CAPILLARY: Glucose-Capillary: 173 mg/dL — ABNORMAL HIGH (ref 70–99)

## 2020-03-17 LAB — LACTIC ACID, PLASMA: Lactic Acid, Venous: 1.4 mmol/L (ref 0.5–1.9)

## 2020-03-17 LAB — TROPONIN I (HIGH SENSITIVITY): Troponin I (High Sensitivity): 101 ng/L (ref <18)

## 2020-03-17 LAB — CBG MONITORING, ED
Glucose-Capillary: 192 mg/dL — ABNORMAL HIGH (ref 70–99)
Glucose-Capillary: 154 mg/dL — ABNORMAL HIGH (ref 70–99)
Glucose-Capillary: 162 mg/dL — ABNORMAL HIGH (ref 70–99)

## 2020-03-17 LAB — PHOSPHORUS: Phosphorus: 3.5 mg/dL (ref 2.5–4.6)

## 2020-03-17 LAB — C-REACTIVE PROTEIN: CRP: 11.3 mg/dL — ABNORMAL HIGH (ref <1.0)

## 2020-03-17 LAB — SARS CORONAVIRUS 2 (TAT 6-24 HRS): SARS Coronavirus 2: POSITIVE — AB

## 2020-03-17 LAB — MAGNESIUM: Magnesium: 1.9 mg/dL (ref 1.7–2.4)

## 2020-03-17 MED ORDER — METHYLPREDNISOLONE SODIUM SUCC 125 MG IJ SOLR
45.0000 mg | Freq: Two times a day (BID) | INTRAMUSCULAR | Status: DC
  Administered 2020-03-17 – 2020-03-23 (×12): 45 mg via INTRAVENOUS
  Filled 2020-03-17 (×14): qty 2

## 2020-03-17 MED ORDER — ENOXAPARIN SODIUM 40 MG/0.4ML ~~LOC~~ SOLN
40.0000 mg | SUBCUTANEOUS | Status: DC
  Administered 2020-03-17 – 2020-03-30 (×14): 40 mg via SUBCUTANEOUS
  Filled 2020-03-17 (×14): qty 0.4

## 2020-03-17 MED ORDER — ASPIRIN EC 81 MG PO TBEC: 81.0000 mg | DELAYED_RELEASE_TABLET | Freq: Every day | ORAL | Status: DC

## 2020-03-17 NOTE — ED Notes (Signed)
Pt placed on humidified [place] at 10LPM

## 2020-03-17 NOTE — ED Notes (Signed)
Pt resting at this time. Pt stable on 6 liters via [place].

## 2020-03-17 NOTE — ED Notes (Signed)
Blue top sent to the lab. 

## 2020-03-17 NOTE — ED Notes (Signed)
Pt resting comfortably at this time. Pt remains stable on NRB. Pt denies any needs at this time.

## 2020-03-17 NOTE — ED Notes (Signed)
Pt placed back on 6 liter via [place] while administering medication. RN will monitor pt to see how he tolerates.

## 2020-03-17 NOTE — ED Notes (Signed)
Pt monitor alarming that pts SpO2 88%. Pt currently on 6 liter O2 via [place]. RN instructed pt to take several deep breaths through his nose. SpO2 improved to 91%, however it then dropped back to 86%. Pt maintaining sats of 88-89% on 6 liters, pt states that he feels like he cannot breath good. Pt was placed back on NRB with improvement in sats to 93%. Pt reports feeling like his breathing has improved with NRB.

## 2020-03-17 NOTE — ED Notes (Signed)
Brief changed, complete linen change.

## 2020-03-17 NOTE — ED Notes (Signed)
Brief changed and complete linen change completed. Pt repositioned.

## 2020-03-17 NOTE — Evaluation (Addendum)
Clinical/Bedside Swallow Evaluation Patient Details  Name: Malik [name] MRN: [id number] Date of Birth: Apr 18, 1945  Today's Date: 03/17/2020 Time: SLP Start Time (ACUTE ONLY): 1015 SLP Stop Time (ACUTE ONLY): 1115 SLP Time Calculation (min) (ACUTE ONLY): 60 min  Past Medical History:  Past Medical History:  Diagnosis Date  . Angina of effort ([place])   . Chronic diastolic CHF (congestive heart failure) ([place])   . CVA (cerebral vascular accident) ([place]) 08/10/2019   right sided weakness  . Diabetes ([place])   . Diabetes mellitus without complication ([place])   . Dilated cardiomyopathy ([place])   . Heart disease   . High blood pressure   . Hypertension   . 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 Moderate mitral insufficiency   . Neuropathy    with LE weakness  . Sleep apnea   . TBI (traumatic brain injury) [facility])    Past Surgical History:  Past Surgical History:  Procedure Laterality Date  . BACK SURGERY    . ESOPHAGOGASTRODUODENOSCOPY (EGD) WITH PROPOFOL N/A 10/02/2019   Procedure: ESOPHAGOGASTRODUODENOSCOPY (EGD) WITH PROPOFOL;  Surgeon: Irene Shipper, MD;  Location: [facility] ENDOSCOPY;  Service: Endoscopy;  Laterality: N/A;  . IR CT HEAD LTD  08/10/2019  . IR PERCUTANEOUS ART THROMBECTOMY/INFUSION INTRACRANIAL INC DIAG ANGIO  08/10/2019  . RADIOLOGY WITH ANESTHESIA N/A 08/10/2019   Procedure: IR WITH ANESTHESIA -CODE STROKE;  Surgeon: Radiologist, Medication, MD;  Location: [facility];  Service: Radiology;  Laterality: N/A;  . 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 male with medical history significant for CVA in 2021 with resulting RUE hemiplegia and aphasia, htn, hfpef, dm, recent admission for upper GI bleed thought to be 2/2 angiodysplasia, ETOH abuse, iron deficiency anemia, DM2, CKD3a, HTN, OSA, HLD, alcohol abuse  who presents with hypoxia.  Presented to our ED on 02/28/2020 with fever/chills and fatigue. Covid tested, result was positive. Patient left AMA prior to being seen by a provider.  Son reports that for the past week condition has waxed and waned. At times normal self in terms of mentation. Baseline is bedbound and has Aphasia, responds with yes/no to basic questions. At other times seems lethargic. Today SLP came for his Speech therapy and noticed patient to be lethargic. Pulse ox revealed o2 in the 70s so EMS was alerted.  Currently on [place] O2 support.  CXR: Increased airspace opacities bilaterally.  Covid+.  Assessment / Plan / Recommendation Clinical Impression  Pt appears to present w/ grossly adequate oropharyngeal phase swallow function w/ mild oropharyngeal phase dysphagia noted when not following general aspiration precautions(multiple sips of thin liquids) and w/ increased textured foods(solids). Neuromuscular deficits noted per prior MBSS in 08/2019; BSEs performed in 09/2019. Pt endorsed he was eating a "regular" diet at home prior to this admit. Unsure if his insight and follow through w/ aspiration precautions. Overt coughing noted when pt consumed Multiple sips of thin liquids via Cup at this assessment. However, when aspiration precautions were implemented, and pt supported during feeding of po's, No further overt, clinical s/s of aspiration during po trials. Pt appears at reduced risk for aspiration following general aspiration precautions. During po trials, pt consumed all consistencies w/ no immediate overt coughing, decline in vocal quality, or change in respiratory presentation during/post trials(O2 sats 91-94%). Except for the immediate Cough following Multiple  sips of thin liquids via Cup. When sips were limited to 1-2 at a time via tilting back on Cup or Pinching Straw, no further overt s/s of aspiration were noted. Oral phase appeared grossly [facility] w/ timely bolus management, mastication, and  control of bolus propulsion for A-P transfer for swallowing except min increased Time needed to fully masticate/clear the most solid textured foods -- moistening of foods added to ease effort. Pt is also Missing Dentition Baseline. Oral clearing achieved w/ all trial consistencies w/ Time. OM Exam appeared grossly [facility] w/ no unilateral weakness noted but apparent reduced protrusion effort and overall coordination. Pt appeared min defensive during OM exam. Speech volume low, y/ns answered sec. to Aphasia Baseline. Pt supported fully during feeding of trials. Recommend a Mech Soft consistency diet w/ Chopped/Minced meats, moistened foods/gravies; Thin liquids 1-2 SIPS at a time. Recommend general aspiration precautions, Pills WHOLE in Puree for safer, easier swallowing. Suspect pt is at increased risk for aspiration d/t prior oropharyngeal phase dysphagia and Cognitive-linguistic deficits from CVA, ETOH abuse. Education given on Pills in Puree; food consistency; general aspiration precautions for room, chart. NSG/MD updated. SLP Visit Diagnosis: Dysphagia, oropharyngeal phase (R13.12);Aphasia (R47.01) (baseline deficits d/t CVA, ETOH abuse)    Aspiration Risk  Mild aspiration risk;Risk for inadequate nutrition/hydration (reduced following precautions)    Diet Recommendation  Mech Soft diet (dysphagia level 3) w/ Chopped/Minced meats and Gravies added; Thin liquids using 1-2 sips at a time -- No Multiple sips or drinking quickly.  Tray setup and support at meals d/t RUE weakness; Cognitive-linguistic decline.  General aspiration, Reflux precautions.   Medication Administration: Whole meds with puree (for safer swallowing, vs need to Crush)    Other  Recommendations Recommended Consults:  (Dietician f/u) Oral Care Recommendations: Oral care BID;Oral care before and after PO;Staff/trained caregiver to provide oral care Other Recommendations:  (n/a)   Follow up Recommendations  (continue his Home Health)       Frequency and Duration min 2x/week  1 week       Prognosis Prognosis for Safe Diet Advancement: Fair Barriers to Reach Goals: Cognitive deficits;Language deficits;Time post onset;Severity of deficits      Swallow Study   General Date of Onset: 03/16/20 HPI: Pt is a 75 y.o. male with medical history significant for CVA in 2021 with resulting RUE hemiplegia and aphasia, htn, hfpef, dm, recent admission for upper GI bleed thought to be 2/2 angiodysplasia, ETOH abuse, iron deficiency anemia, DM2, CKD3a, HTN, OSA, HLD, alcohol abuse who presents with hypoxia.  Presented to our ED on 02/28/2020 with fever/chills and fatigue. Covid tested, result was positive. Patient left AMA prior to being seen by a provider.  Son reports that for the past week condition has waxed and waned. At times normal self in terms of mentation. Baseline is bedbound and has Aphasia, responds with yes/no to basic questions. At other times seems lethargic. Today SLP came for his Speech therapy and noticed patient to be lethargic. Pulse ox revealed o2 in the 70s so EMS was alerted.  Currently on [place] O2 support.  CXR: Increased airspace opacities bilaterally. Type of Study: Bedside Swallow Evaluation Previous Swallow Assessment: 05/2017; 08/2019; 09/2019; MBS 08/17/19: moderate oral and more mild pharyngeal dysphagia. He had more oral holding than was observed at bedside. Diet Prior to this Study: NPO (pt nodded to regular diet at home; priro dys. level 2 w/ thins) Temperature Spikes Noted: No (wbc 7.7) Respiratory Status: Nasal cannula (6L) History of Recent Intubation: No Behavior/Cognition:  Alert;Cooperative;Pleasant mood;Requires cueing (Aphasic; min verbal) Oral Cavity Assessment: Dry Oral Care Completed by SLP: Yes Oral Cavity - Dentition: Missing dentition Vision: Functional for self-feeding Self-Feeding Abilities: Able to feed self;Needs assist;Needs set up (Mod support) Patient Positioning: Upright in bed (needed  Mod support for upright position) Baseline Vocal Quality:  (only y/n responses - few) Volitional Cough: Cognitively unable to elicit Volitional Swallow: Unable to elicit    Oral/Motor/Sensory Function Overall Oral Motor/Sensory Function: Mild impairment Facial ROM: Within Functional Limits (grossly) Facial Symmetry: Within Functional Limits (grossly) Facial Strength: Within Functional Limits (grossly) Lingual Symmetry: Within Functional Limits (protrusion) Lingual Strength: Reduced (min) Velum:  (CNT) Mandible: Within Functional Limits   Ice Chips Ice chips: Within functional limits Presentation: Spoon (fed; 5 trials)   Thin Liquid Thin Liquid: Impaired Presentation: Cup;Self Fed;Straw (fully supported pt holding cup; cup-5; straw-~3ozs) Oral Phase Impairments:  (adequate) Oral Phase Functional Implications:  (adequate) Pharyngeal  Phase Impairments: Suspected delayed Swallow;Cough - Immediate (x1)    Nectar Thick Nectar Thick Liquid: Not tested   Honey Thick Honey Thick Liquid: Not tested   Puree Puree: Within functional limits Presentation: Spoon (fed; 10 trials)   Solid     Solid: Impaired Presentation: Spoon (fed; 10 trials) Oral Phase Impairments: Impaired mastication;Reduced lingual movement/coordination (min+) Oral Phase Functional Implications: Prolonged oral transit;Impaired mastication (also missing some Dentition) Pharyngeal Phase Impairments:  (none)        [name], MS, [employer] Speech Language Pathologist Rehab Services [phone] Berniece Abid 03/17/2020,1:37 PM

## 2020-03-17 NOTE — ED Notes (Signed)
Message sent to attending MD to inform him that pt is now back on NRB. Pt is resting comfortably at this time. RN will continue to monitor

## 2020-03-17 NOTE — Progress Notes (Signed)
PROGRESS NOTE  Malik [name] ZHG:[id number] DOB: August 27, 1945 DOA: 03/16/2020 PCP: [facility] [place], NP   LOS: 1 day   Brief Narrative / Interim history: 75 y.o. 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 He presented to our ED on 12/29 with fever/chills and fatigue and he tested positive for COVID-19.  He left AMA prior to being seen by ED provider. Family reports more lethargy, found to be hypoxic and was sent to ER  Subjective / 24h Interval events: He tells me he is doing a little bit better with the oxygen on.  No chest pain, no abdominal pain, no nausea or vomiting.  Assessment & Plan:  Principal Problem Acute Hypoxic Respiratory Failure due to Covid-19 Viral Illness, severe sepsis -Elevated lactic acid on admission, resolved with fluids -Procalcitonin low -Chest x-ray on admission showed multifocal pneumonia, consistent with COVID-19.  Of note, on his ED presentation 9 days ago 12/29 chest x-ray also showed findings concerning for pneumonia however family took him home. -Patient was started on steroids, baricitinib, Remdesivir, continue -Remains hypoxic requiring nonrebreather, 15 L, alternating with nasal cannula, very guarded prognosis given amount of time he waited at home prior to treatment   COVID-19 Labs  Recent Labs    03/16/20 1414  DDIMER 0.92*    Lab Results  Component Value Date   SARSCOV2NAA POSITIVE (A) 03/08/2020   [place] NEGATIVE 10/05/2019   [place] NEGATIVE 09/28/2019   [place] NEGATIVE 08/23/2019   Active Problems Elevated troponin -Likely demand, trending down, no significant ischemic changes on EKG, initially on heparin infusion but can discontinue this today  Chronic diastolic CHF -Appears euvolemic, hold further IV fluids  Prior CVA  in 2021 -With resultant right-sided hemiplegia, aphasia -Continue atorvastatin, previously on dual antiplatelet therapy but stopped after his GI bleed, however saw neurology as an outpatient again in September 2021 and Plavix was recommended at that time. -Have asked pharmacy to look into this and see what he is actually taking  Essential hypertension -Continue metoprolol, amlodipine is on hold, normotensive this morning  DM2 -Placed on sliding scale, monitor given Covid and steroid use  History of EtOH -No recent heavy drinking, monitor  Recent GI bleed -Hospitalized after his stroke, at that point he was taken off antiplatelets  Scheduled Meds: . atorvastatin  80 mg Oral Daily  . baricitinib  4 mg Oral Daily  . folic acid  1 mg Oral Daily  . insulin aspart  0-15 Units Subcutaneous TID WC  . methylPREDNISolone (SOLU-MEDROL) injection  45 mg Intravenous Q12H  . metoprolol tartrate  25 mg Oral BID  . pantoprazole  40 mg Oral Daily  . thiamine  100 mg Oral Daily   Continuous Infusions: . remdesivir 100 mg in NS 100 mL 100 mg (03/17/20 0948)   PRN Meds:.acetaminophen  DVT prophylaxis: Lovenox Code Status: Full code Family Communication: will call son later   Status is: Inpatient  Remains inpatient appropriate because:Inpatient level of care appropriate due to severity of illness   Dispo: The patient is from: Home              Anticipated d/c is to: Home              Anticipated d/c date is: 3 days  Patient currently is not medically stable to d/c.    Consultants:  None  Procedures:  None   Microbiology: None   Antibacterials: None    Objective: Vitals:   03/17/20 0800 03/17/20 0830 03/17/20 0930 03/17/20 0938  BP: (!) 146/73 139/71 140/75 140/75  Pulse: 64 (!) 50 62 63  Resp: 15 (!) 22 17   Temp:      TempSrc:      SpO2: (!) 88% 100% 97%   Weight:      Height:        Intake/Output Summary (Last 24 hours) at 03/17/2020 1019 Last data  filed at 03/16/2020 1410 Gross per 24 hour  Intake 470 ml  Output --  Net 470 ml   Filed Weights   03/16/20 0942  Weight: 90.3 kg    Examination:  Constitutional: NAD Eyes: no scleral icterus ENMT: Mucous membranes are moist.  Neck: normal, supple Respiratory: bibasilar rhonchi, no wheezing, no crackles. Normal respiratory effort. No accessory muscle use.  Cardiovascular: Regular rate and rhythm, no murmurs / rubs / gallops. No LE edema.  Abdomen: non distended, no tenderness. Bowel sounds positive.  Musculoskeletal: no clubbing / cyanosis.  Skin: no rashes Neurologic: right sided weakness, no new focal deficits  Data Reviewed: I have independently reviewed following labs and imaging studies   CBC: Recent Labs  Lab 03/16/20 0947 03/17/20 0540  WBC 10.8* 7.7  NEUTROABS 9.6* 6.1  HGB 12.6* 11.0*  HCT 38.8* 33.4*  MCV 80.3 79.7*  PLT 284 [id number]   Basic Metabolic Panel: Recent Labs  Lab 03/16/20 0947 03/17/20 0540  NA 141 142  K 4.0 4.2  CL 105 111  CO2 23 23  GLUCOSE 251* 177*  BUN 15 16  CREATININE 1.12 0.92  CALCIUM 8.7* 8.2*  MG  --  1.9  PHOS  --  3.5   GFR: Estimated Creatinine Clearance: 78.4 mL/min (by C-G formula based on SCr of 0.92 mg/dL). Liver Function Tests: Recent Labs  Lab 03/16/20 0947 03/17/20 0540  AST 54* 41  ALT 21 17  ALKPHOS 60 50  BILITOT 0.8 0.7  PROT 8.0 6.5  ALBUMIN 3.4* 2.6*   No results for input(s): LIPASE, AMYLASE in the last 168 hours. No results for input(s): AMMONIA in the last 168 hours. Coagulation Profile: Recent Labs  Lab 03/16/20 0947  INR 1.0   Cardiac Enzymes: No results for input(s): CKTOTAL, CKMB, CKMBINDEX, TROPONINI in the last 168 hours. BNP (last 3 results) No results for input(s): PROBNP in the last 8760 hours. HbA1C: No results for input(s): HGBA1C in the last 72 hours. CBG: Recent Labs  Lab 03/16/20 1720 03/17/20 0106 03/17/20 0735  GLUCAP 190* 192* 154*   Lipid Profile: No results for  input(s): CHOL, HDL, LDLCALC, TRIG, CHOLHDL, LDLDIRECT in the last 72 hours. Thyroid Function Tests: No results for input(s): TSH, T4TOTAL, FREET4, T3FREE, THYROIDAB in the last 72 hours. Anemia Panel: No results for input(s): VITAMINB12, FOLATE, FERRITIN, TIBC, IRON, RETICCTPCT in the last 72 hours. Urine analysis:    Component Value Date/Time   COLORURINE YELLOW 09/30/2019 1005   APPEARANCEUR CLEAR 09/30/2019 1005   APPEARANCEUR Clear 06/11/2011 2046   LABSPEC 1.014 09/30/2019 1005   LABSPEC 1.025 06/11/2011 2046   PHURINE 7.0 09/30/2019 1005   GLUCOSEU NEGATIVE 09/30/2019 1005   GLUCOSEU Negative 06/11/2011 2046   HGBUR NEGATIVE 09/30/2019 1005   BILIRUBINUR NEGATIVE 09/30/2019 1005   BILIRUBINUR Negative 06/11/2011 2046   [place] 09/30/2019 1005   PROTEINUR 30 (  A) 09/30/2019 1005   NITRITE NEGATIVE 09/30/2019 1005   LEUKOCYTESUR NEGATIVE 09/30/2019 1005   LEUKOCYTESUR Negative 06/11/2011 2046   Sepsis Labs: Invalid input(s): PROCALCITONIN, LACTICIDVEN  Recent Results (from the past 240 hour(s))  Resp Panel by RT-PCR (Flu A&B, Covid) Nasopharyngeal Swab     Status: Abnormal   Collection Time: 03/08/20 10:20 PM   Specimen: Nasopharyngeal Swab; Nasopharyngeal(NP) swabs in vial transport medium  Result Value Ref Range Status   SARS Coronavirus 2 by RT PCR POSITIVE (A) NEGATIVE Final    Comment: RESULT CALLED TO, READ BACK BY AND VERIFIED WITH: EMAIL L BURKE 03/09/20 JDW (NOTE) SARS-CoV-2 target nucleic acids are DETECTED.  The SARS-CoV-2 RNA is generally detectable in upper respiratory specimens during the acute phase of infection. Positive results are indicative of the presence of the identified virus, but do not rule out bacterial infection or co-infection with other pathogens not detected by the test. Clinical correlation with patient history and other diagnostic information is necessary to determine patient infection status. The expected result is  Negative.  Fact Sheet for Patients: EntrepreneurPulse.com.au  Fact Sheet for Healthcare Providers: IncredibleEmployment.be  This test is not yet approved or cleared by the [place] FDA and  has been authorized for detection and/or diagnosis of SARS-CoV-2 by FDA under an Emergency Use Authorization (EUA).  This EUA will remain in effect (meaning this test can be used)  for the duration of  the COVID-19 declaration under Section 564(b)(1) of the Act, 21 U.S.C. section 360bbb-3(b)(1), unless the authorization is terminated or revoked sooner.     Influenza A by PCR NEGATIVE NEGATIVE Final   Influenza B by PCR NEGATIVE NEGATIVE Final    Comment: (NOTE) The Xpert Xpress SARS-CoV-2/FLU/RSV plus assay is intended as an aid in the diagnosis of influenza from Nasopharyngeal swab specimens and should not be used as a sole basis for treatment. Nasal washings and aspirates are unacceptable for Xpert Xpress SARS-CoV-2/FLU/RSV testing.  Fact Sheet for Patients: EntrepreneurPulse.com.au  Fact Sheet for Healthcare Providers: IncredibleEmployment.be  This test is not yet approved or cleared by the [place] FDA and has been authorized for detection and/or diagnosis of SARS-CoV-2 by FDA under an Emergency Use Authorization (EUA). This EUA will remain in effect (meaning this test can be used) for the duration of the COVID-19 declaration under Section 564(b)(1) of the Act, 21 U.S.C. section 360bbb-3(b)(1), unless the authorization is terminated or revoked.  Performed at [facility], [place] [address] [place], [place] [address]   Blood Culture (routine x 2)     Status: None (Preliminary result)   Collection Time: 03/16/20  9:47 AM   Specimen: BLOOD LEFT ARM  Result Value Ref Range Status   Specimen Description BLOOD LEFT ARM  Final   Special Requests   Final    BOTTLES DRAWN AEROBIC AND ANAEROBIC Blood  Culture adequate volume   Culture   Final    NO GROWTH < 24 HOURS Performed at [facility], [address] [place], [place] [address]    Report Status PENDING  Incomplete  Blood Culture (routine x 2)     Status: None (Preliminary result)   Collection Time: 03/16/20  9:47 AM   Specimen: BLOOD  Result Value Ref Range Status   Specimen Description BLOOD LEFT ANTECUBITAL  Final   Special Requests   Final    BOTTLES DRAWN AEROBIC AND ANAEROBIC Blood Culture adequate volume   Culture   Final    NO GROWTH < 24  HOURS Performed at [facility], [address] [place], [place] [address]    Report Status PENDING  Incomplete      Radiology Studies: DG Chest [facility] 1 View  Result Date: 03/16/2020 CLINICAL DATA:  History of COVID-19 positivity with hypoxia EXAM: PORTABLE CHEST 1 VIEW COMPARISON:  03/08/2020 FINDINGS: Cardiac shadow is stable when compared with the prior exam. Lungs are well aerated bilaterally. Increased airspace opacities are noted throughout the right lung and primarily within the left lung base when compare with the prior exam consistent with the given clinical history. Healed rib fractures are noted bilaterally. No other focal abnormality is noted. IMPRESSION: Increased airspace opacities bilaterally as described consistent with the given clinical history. Electronically Signed   By: Inez Catalina M.D.   On: 03/16/2020 10:10    Marzetta Board, MD, PhD [facility]  Between 7 am - 7 pm I am available, please contact me via Amion or Securechat  Between 7 pm - 7 am I am not available, please contact night coverage MD/APP via Amion

## 2020-03-18 DIAGNOSIS — J1282 Pneumonia due to coronavirus disease 2019: Secondary | ICD-10-CM | POA: Diagnosis not present

## 2020-03-18 DIAGNOSIS — U071 COVID-19: Secondary | ICD-10-CM | POA: Diagnosis not present

## 2020-03-18 LAB — COMPREHENSIVE METABOLIC PANEL
ALT: 18 U/L (ref 0–44)
AST: 31 U/L (ref 15–41)
Albumin: 2.5 g/dL — ABNORMAL LOW (ref 3.5–5.0)
Alkaline Phosphatase: 54 U/L (ref 38–126)
Anion gap: 8 (ref 5–15)
BUN: 20 mg/dL (ref 8–23)
CO2: 23 mmol/L (ref 22–32)
Calcium: 8.4 mg/dL — ABNORMAL LOW (ref 8.9–10.3)
Chloride: 110 mmol/L (ref 98–111)
Creatinine, Ser: 0.93 mg/dL (ref 0.61–1.24)
GFR, Estimated: >60 mL/min (ref >60)
Glucose, Bld: 253 mg/dL — ABNORMAL HIGH (ref 70–99)
Potassium: 4.1 mmol/L (ref 3.5–5.1)
Sodium: 141 mmol/L (ref 135–145)
Total Bilirubin: 0.7 mg/dL (ref 0.3–1.2)
Total Protein: 6.5 g/dL (ref 6.5–8.1)

## 2020-03-18 LAB — CBC WITH DIFFERENTIAL/PLATELET
Abs Immature Granulocytes: 0.02 10*3/uL (ref 0.00–0.07)
Basophils Absolute: 0 10*3/uL (ref 0.0–0.1)
Basophils Relative: 0 %
Eosinophils Absolute: 0 10*3/uL (ref 0.0–0.5)
Eosinophils Relative: 0 %
HCT: 33.5 % — ABNORMAL LOW (ref 39.0–52.0)
Hemoglobin: 11.3 g/dL — ABNORMAL LOW (ref 13.0–17.0)
Immature Granulocytes: 0 %
Lymphocytes Relative: 14 %
Lymphs Abs: 1.2 10*3/uL (ref 0.7–4.0)
MCH: 26.5 pg (ref 26.0–34.0)
MCHC: 33.7 g/dL (ref 30.0–36.0)
MCV: 78.5 fL — ABNORMAL LOW (ref 80.0–100.0)
Monocytes Absolute: 0.5 10*3/uL (ref 0.1–1.0)
Monocytes Relative: 5 %
Neutro Abs: 6.9 10*3/uL (ref 1.7–7.7)
Neutrophils Relative %: 81 %
Platelets: 347 10*3/uL (ref 150–400)
RBC: 4.27 MIL/uL (ref 4.22–5.81)
RDW: 20.6 % — ABNORMAL HIGH (ref 11.5–15.5)
Smear Review: NORMAL
WBC: 8.6 10*3/uL (ref 4.0–10.5)
nRBC: 0.2 % (ref 0.0–0.2)

## 2020-03-18 LAB — MAGNESIUM: Magnesium: 2.2 mg/dL (ref 1.7–2.4)

## 2020-03-18 LAB — FERRITIN: Ferritin: 141 ng/mL (ref 24–336)

## 2020-03-18 LAB — D-DIMER, QUANTITATIVE: D-Dimer, Quant: 0.88 ug/mL-FEU — ABNORMAL HIGH (ref 0.00–0.50)

## 2020-03-18 LAB — GLUCOSE, CAPILLARY
Glucose-Capillary: 220 mg/dL — ABNORMAL HIGH (ref 70–99)
Glucose-Capillary: 256 mg/dL — ABNORMAL HIGH (ref 70–99)
Glucose-Capillary: 205 mg/dL — ABNORMAL HIGH (ref 70–99)
Glucose-Capillary: 216 mg/dL — ABNORMAL HIGH (ref 70–99)

## 2020-03-18 LAB — PROCALCITONIN: Procalcitonin: 0.14 ng/mL

## 2020-03-18 LAB — PHOSPHORUS: Phosphorus: 3.3 mg/dL (ref 2.5–4.6)

## 2020-03-18 LAB — C-REACTIVE PROTEIN: CRP: 6.9 mg/dL — ABNORMAL HIGH (ref <1.0)

## 2020-03-18 MED ORDER — AMLODIPINE BESYLATE 10 MG PO TABS
10.0000 mg | ORAL_TABLET | Freq: Every day | ORAL | Status: DC
  Administered 2020-03-18 – 2020-03-30 (×13): 10 mg via ORAL
  Filled 2020-03-18 (×2): qty 1
  Filled 2020-03-18: qty 2
  Filled 2020-03-18 (×2): qty 1
  Filled 2020-03-18: qty 2
  Filled 2020-03-18 (×3): qty 1
  Filled 2020-03-18 (×4): qty 2

## 2020-03-18 NOTE — Progress Notes (Signed)
PROGRESS NOTE  Malik [name] BOF:[id number] DOB: September 06, 1945 DOA: 03/16/2020 PCP: [facility] [place], NP   LOS: 2 days   Brief Narrative / Interim history: 75 y.o. 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 He presented to our ED on 12/29 with fever/chills and fatigue and he tested positive for COVID-19.  He left AMA prior to being seen by ED provider. Family reports more lethargy, found to be hypoxic and was sent to ER  Subjective / 24h Interval events: No shortness of breath as long as he has the oxygen. No chest pain, no abdominal pain, no nausea or vomiting  Assessment & Plan:  Principal Problem Acute Hypoxic Respiratory Failure due to Covid-19 Viral Illness, severe sepsis -Elevated lactic acid on admission, resolved with fluids -Procalcitonin low -Chest x-ray on admission showed multifocal pneumonia, consistent with COVID-19.  Of note, on his ED presentation 9 days ago 12/29 chest x-ray also showed findings concerning for pneumonia however family took him home. -Patient was started on steroids, baricitinib, Remdesivir, continue -Remains hypoxic but slightly improved, on 10 L   COVID-19 Labs  Recent Labs    03/16/20 1414 03/17/20 0540 03/17/20 0739 03/18/20 0438  DDIMER 0.92*  --  0.85*  --   FERRITIN  --  147  --  141  CRP  --  11.3*  --  6.9*    Lab Results  Component Value Date   SARSCOV2NAA POSITIVE (A) 03/17/2020   SARSCOV2NAA POSITIVE (A) 03/08/2020   [place] NEGATIVE 10/05/2019   [place] NEGATIVE 09/28/2019   Active Problems Elevated troponin -Likely demand, trending down, no significant ischemic changes on EKG, initially on heparin infusion but has been discontinued  Chronic diastolic CHF -Appears euvolemic, hold further IV fluids  Prior CVA in  2021 -With resultant right-sided hemiplegia, aphasia -Continue atorvastatin, previously on dual antiplatelet therapy but stopped after his GI bleed, however saw neurology as an outpatient again in September 2021 and Plavix was recommended at that time. -Have asked pharmacy to look into this and see what he is actually taking  Essential hypertension -Continue metoprolol, blood pressure on the high side now, resume amlodipine  DM2 -Placed on sliding scale, monitor given Covid and steroid use, fairly acceptable this morning   CBG (last 3)  Recent Labs    03/17/20 1247 03/17/20 1940 03/18/20 0934  GLUCAP 162* 173* 220*   History of EtOH -No recent heavy drinking, monitor  Recent GI bleed -Hospitalized after his stroke, at that point he was taken off antiplatelets. D/w son and daughter over the phone, not on any antiplatelets currently   Scheduled Meds: . atorvastatin  80 mg Oral Daily  . baricitinib  4 mg Oral Daily  . enoxaparin (LOVENOX) injection  40 mg Subcutaneous Q24H  . folic acid  1 mg Oral Daily  . insulin aspart  0-15 Units Subcutaneous TID WC  . methylPREDNISolone (SOLU-MEDROL) injection  45 mg Intravenous Q12H  . metoprolol tartrate  25 mg Oral BID  . pantoprazole  40 mg Oral Daily  . thiamine  100 mg Oral Daily   Continuous Infusions: . remdesivir 100 mg in NS 100 mL 100 mg (03/18/20 1009)   PRN Meds:.acetaminophen  DVT prophylaxis: Lovenox Code Status: Full code Family Communication: Discussed with son and daughter over the phone  Status is: Inpatient  Remains inpatient appropriate  because:Inpatient level of care appropriate due to severity of illness   Dispo: The patient is from: Home              Anticipated d/c is to: Home              Anticipated d/c date is: 3 days              Patient currently is not medically stable to d/c.  Consultants:  None  Procedures:  None   Microbiology: None   Antibacterials: None    Objective: Vitals:    03/17/20 1939 03/18/20 0522 03/18/20 0524 03/18/20 0812  BP: (!) 192/85  (!) 160/82 (!) 162/80  Pulse: 75  (!) 54 (!) 55  Resp: 20  20 20   Temp: 98.1 F (36.7 C)  97.8 F (36.6 C) 97.7 F (36.5 C)  TempSrc: Oral  Oral Oral  SpO2: 97%  99% 97%  Weight:  88.3 kg    Height:        Intake/Output Summary (Last 24 hours) at 03/18/2020 1120 Last data filed at 03/18/2020 0543 Gross per 24 hour  Intake 98.16 ml  Output 625 ml  Net -526.84 ml   Filed Weights   03/16/20 0942 03/18/20 0522  Weight: 90.3 kg 88.3 kg    Examination:  Constitutional: No distress, in bed Eyes: No icterus ENMT: Moist membranes Neck: normal, supple Respiratory: Bibasilar rhonchi, no wheezing Cardiovascular: Regular rate and rhythm, no murmurs.  No edema Abdomen: Soft, NT, ND, bowel sounds positive Musculoskeletal: no clubbing / cyanosis.  Skin: No rashes seen Neurologic: right sided weakness, no new focal deficits  Data Reviewed: I have independently reviewed following labs and imaging studies   CBC: Recent Labs  Lab 03/16/20 0947 03/17/20 0540 03/18/20 0438  WBC 10.8* 7.7 8.6  NEUTROABS 9.6* 6.1 6.9  HGB 12.6* 11.0* 11.3*  HCT 38.8* 33.4* 33.5*  MCV 80.3 79.7* 78.5*  PLT 284 290 [id number]   Basic Metabolic Panel: Recent Labs  Lab 03/16/20 0947 03/17/20 0540 03/18/20 0438  NA 141 142 141  K 4.0 4.2 4.1  CL 105 111 110  CO2 23 23 23   GLUCOSE 251* 177* 253*  BUN 15 16 20   CREATININE 1.12 0.92 0.93  CALCIUM 8.7* 8.2* 8.4*  MG  --  1.9 2.2  PHOS  --  3.5 3.3   GFR: Estimated Creatinine Clearance: 76.8 mL/min (by C-G formula based on SCr of 0.93 mg/dL). Liver Function Tests: Recent Labs  Lab 03/16/20 0947 03/17/20 0540 03/18/20 0438  AST 54* 41 31  ALT 21 17 18   ALKPHOS 60 50 54  BILITOT 0.8 0.7 0.7  PROT 8.0 6.5 6.5  ALBUMIN 3.4* 2.6* 2.5*   No results for input(s): LIPASE, AMYLASE in the last 168 hours. No results for input(s): AMMONIA in the last 168 hours. Coagulation  Profile: Recent Labs  Lab 03/16/20 0947  INR 1.0   Cardiac Enzymes: No results for input(s): CKTOTAL, CKMB, CKMBINDEX, TROPONINI in the last 168 hours. BNP (last 3 results) No results for input(s): PROBNP in the last 8760 hours. HbA1C: No results for input(s): HGBA1C in the last 72 hours. CBG: Recent Labs  Lab 03/17/20 0106 03/17/20 0735 03/17/20 1247 03/17/20 1940 03/18/20 0934  GLUCAP 192* 154* 162* 173* 220*   Lipid Profile: No results for input(s): CHOL, HDL, LDLCALC, TRIG, CHOLHDL, LDLDIRECT in the last 72 hours. Thyroid Function Tests: No results for input(s): TSH, T4TOTAL, FREET4, T3FREE, THYROIDAB in the last 72 hours. Anemia Panel:  Recent Labs    03/17/20 0540 03/18/20 0438  FERRITIN 147 141   Urine analysis:    Component Value Date/Time   COLORURINE YELLOW 09/30/2019 1005   APPEARANCEUR CLEAR 09/30/2019 1005   APPEARANCEUR Clear 06/11/2011 2046   LABSPEC 1.014 09/30/2019 1005   LABSPEC 1.025 06/11/2011 2046   PHURINE 7.0 09/30/2019 1005   GLUCOSEU NEGATIVE 09/30/2019 1005   GLUCOSEU Negative 06/11/2011 2046   HGBUR NEGATIVE 09/30/2019 1005   BILIRUBINUR NEGATIVE 09/30/2019 1005   BILIRUBINUR Negative 06/11/2011 2046   KETONESUR NEGATIVE 09/30/2019 1005   PROTEINUR 30 (A) 09/30/2019 1005   NITRITE NEGATIVE 09/30/2019 1005   LEUKOCYTESUR NEGATIVE 09/30/2019 1005   LEUKOCYTESUR Negative 06/11/2011 2046   Sepsis Labs: Invalid input(s): PROCALCITONIN, LACTICIDVEN  Recent Results (from the past 240 hour(s))  Resp Panel by RT-PCR (Flu A&B, Covid) Nasopharyngeal Swab     Status: Abnormal   Collection Time: 03/08/20 10:20 PM   Specimen: Nasopharyngeal Swab; Nasopharyngeal(NP) swabs in vial transport medium  Result Value Ref Range Status   SARS Coronavirus 2 by RT PCR POSITIVE (A) NEGATIVE Final    Comment: RESULT CALLED TO, READ BACK BY AND VERIFIED WITH: EMAIL L BURKE 03/09/20 JDW (NOTE) SARS-CoV-2 target nucleic acids are DETECTED.  The SARS-CoV-2  RNA is generally detectable in upper respiratory specimens during the acute phase of infection. Positive results are indicative of the presence of the identified virus, but do not rule out bacterial infection or co-infection with other pathogens not detected by the test. Clinical correlation with patient history and other diagnostic information is necessary to determine patient infection status. The expected result is Negative.  Fact Sheet for Patients: EntrepreneurPulse.com.au  Fact Sheet for Healthcare Providers: IncredibleEmployment.be  This test is not yet approved or cleared by the [place] FDA and  has been authorized for detection and/or diagnosis of SARS-CoV-2 by FDA under an Emergency Use Authorization (EUA).  This EUA will remain in effect (meaning this test can be used)  for the duration of  the COVID-19 declaration under Section 564(b)(1) of the Act, 21 U.S.C. section 360bbb-3(b)(1), unless the authorization is terminated or revoked sooner.     Influenza A by PCR NEGATIVE NEGATIVE Final   Influenza B by PCR NEGATIVE NEGATIVE Final    Comment: (NOTE) The Xpert Xpress SARS-CoV-2/FLU/RSV plus assay is intended as an aid in the diagnosis of influenza from Nasopharyngeal swab specimens and should not be used as a sole basis for treatment. Nasal washings and aspirates are unacceptable for Xpert Xpress SARS-CoV-2/FLU/RSV testing.  Fact Sheet for Patients: EntrepreneurPulse.com.au  Fact Sheet for Healthcare Providers: IncredibleEmployment.be  This test is not yet approved or cleared by the [place] FDA and has been authorized for detection and/or diagnosis of SARS-CoV-2 by FDA under an Emergency Use Authorization (EUA). This EUA will remain in effect (meaning this test can be used) for the duration of the COVID-19 declaration under Section 564(b)(1) of the Act, 21 U.S.C. section  360bbb-3(b)(1), unless the authorization is terminated or revoked.  Performed at [facility], [place] [address] [place], [place] [address]   Blood Culture (routine x 2)     Status: None (Preliminary result)   Collection Time: 03/16/20  9:47 AM   Specimen: BLOOD LEFT ARM  Result Value Ref Range Status   Specimen Description BLOOD LEFT ARM  Final   Special Requests   Final    BOTTLES DRAWN AEROBIC AND ANAEROBIC Blood Culture adequate volume   Culture   Final  NO GROWTH 2 DAYS Performed at [facility], [address] [place], [place] [address]    Report Status PENDING  Incomplete  Blood Culture (routine x 2)     Status: None (Preliminary result)   Collection Time: 03/16/20  9:47 AM   Specimen: BLOOD  Result Value Ref Range Status   Specimen Description BLOOD LEFT ANTECUBITAL  Final   Special Requests   Final    BOTTLES DRAWN AEROBIC AND ANAEROBIC Blood Culture adequate volume   Culture   Final    NO GROWTH 2 DAYS Performed at [facility], [address], [place], [place] [address]    Report Status PENDING  Incomplete  SARS CORONAVIRUS 2 (TAT 6-24 HRS)     Status: Abnormal   Collection Time: 03/17/20 10:00 AM  Result Value Ref Range Status   SARS Coronavirus 2 POSITIVE (A) NEGATIVE Final    Comment: (NOTE) SARS-CoV-2 target nucleic acids are DETECTED.  The SARS-CoV-2 RNA is generally detectable in upper and lower respiratory specimens during the acute phase of infection. Positive results are indicative of the presence of SARS-CoV-2 RNA. Clinical correlation with patient history and other diagnostic information is  necessary to determine patient infection status. Positive results do not rule out bacterial infection or co-infection with other viruses.  The expected result is Negative.  Fact Sheet for Patients: SugarRoll.be  Fact Sheet for Healthcare Providers: https://www.woods-mathews.com/  This test  is not yet approved or cleared by the [place] FDA and  has been authorized for detection and/or diagnosis of SARS-CoV-2 by FDA under an Emergency Use Authorization (EUA). This EUA will remain  in effect (meaning this test can be used) for the duration of the COVID-19 declaration under Section 564(b)(1) of the Act, 21 U. S.C. section 360bbb-3(b)(1), unless the authorization is terminated or revoked sooner.   Performed at [facility], [place] [address], [place], [place] [address]       Radiology Studies: No results found.  Marzetta Board, MD, PhD [facility]  Between 7 am - 7 pm I am available, please contact me via Amion or Securechat  Between 7 pm - 7 am I am not available, please contact night coverage MD/APP via Amion

## 2020-03-19 DIAGNOSIS — J1282 Pneumonia due to coronavirus disease 2019: Secondary | ICD-10-CM | POA: Diagnosis not present

## 2020-03-19 DIAGNOSIS — U071 COVID-19: Secondary | ICD-10-CM | POA: Diagnosis not present

## 2020-03-19 LAB — COMPREHENSIVE METABOLIC PANEL
ALT: 19 U/L (ref 0–44)
AST: 31 U/L (ref 15–41)
Albumin: 2.7 g/dL — ABNORMAL LOW (ref 3.5–5.0)
Alkaline Phosphatase: 59 U/L (ref 38–126)
Anion gap: 8 (ref 5–15)
BUN: 19 mg/dL (ref 8–23)
CO2: 25 mmol/L (ref 22–32)
Calcium: 8.7 mg/dL — ABNORMAL LOW (ref 8.9–10.3)
Chloride: 108 mmol/L (ref 98–111)
Creatinine, Ser: 0.84 mg/dL (ref 0.61–1.24)
GFR, Estimated: >60 mL/min (ref >60)
Glucose, Bld: 226 mg/dL — ABNORMAL HIGH (ref 70–99)
Potassium: 3.8 mmol/L (ref 3.5–5.1)
Sodium: 141 mmol/L (ref 135–145)
Total Bilirubin: 0.7 mg/dL (ref 0.3–1.2)
Total Protein: 6.9 g/dL (ref 6.5–8.1)

## 2020-03-19 LAB — CBC WITH DIFFERENTIAL/PLATELET
Abs Immature Granulocytes: 0.04 10*3/uL (ref 0.00–0.07)
Basophils Absolute: 0 10*3/uL (ref 0.0–0.1)
Basophils Relative: 0 %
Eosinophils Absolute: 0 10*3/uL (ref 0.0–0.5)
Eosinophils Relative: 0 %
HCT: 37.1 % — ABNORMAL LOW (ref 39.0–52.0)
Hemoglobin: 12.1 g/dL — ABNORMAL LOW (ref 13.0–17.0)
Immature Granulocytes: 0 %
Lymphocytes Relative: 10 %
Lymphs Abs: 1 10*3/uL (ref 0.7–4.0)
MCH: 25.9 pg — ABNORMAL LOW (ref 26.0–34.0)
MCHC: 32.6 g/dL (ref 30.0–36.0)
MCV: 79.3 fL — ABNORMAL LOW (ref 80.0–100.0)
Monocytes Absolute: 0.5 10*3/uL (ref 0.1–1.0)
Monocytes Relative: 5 %
Neutro Abs: 8.5 10*3/uL — ABNORMAL HIGH (ref 1.7–7.7)
Neutrophils Relative %: 85 %
Platelets: 409 10*3/uL — ABNORMAL HIGH (ref 150–400)
RBC: 4.68 MIL/uL (ref 4.22–5.81)
RDW: 20.5 % — ABNORMAL HIGH (ref 11.5–15.5)
Smear Review: NORMAL
WBC: 9.9 10*3/uL (ref 4.0–10.5)
nRBC: 0.4 % — ABNORMAL HIGH (ref 0.0–0.2)

## 2020-03-19 LAB — C-REACTIVE PROTEIN: CRP: 2.9 mg/dL — ABNORMAL HIGH (ref <1.0)

## 2020-03-19 LAB — PHOSPHORUS: Phosphorus: 3.3 mg/dL (ref 2.5–4.6)

## 2020-03-19 LAB — GLUCOSE, CAPILLARY
Glucose-Capillary: 213 mg/dL — ABNORMAL HIGH (ref 70–99)
Glucose-Capillary: 210 mg/dL — ABNORMAL HIGH (ref 70–99)
Glucose-Capillary: 151 mg/dL — ABNORMAL HIGH (ref 70–99)
Glucose-Capillary: 187 mg/dL — ABNORMAL HIGH (ref 70–99)

## 2020-03-19 LAB — MAGNESIUM: Magnesium: 2.1 mg/dL (ref 1.7–2.4)

## 2020-03-19 LAB — D-DIMER, QUANTITATIVE: D-Dimer, Quant: 0.97 ug/mL-FEU — ABNORMAL HIGH (ref 0.00–0.50)

## 2020-03-19 MED ORDER — HYDRALAZINE HCL 20 MG/ML IJ SOLN
10.0000 mg | Freq: Four times a day (QID) | INTRAMUSCULAR | Status: DC | PRN
  Administered 2020-03-19 – 2020-03-23 (×6): 10 mg via INTRAVENOUS
  Filled 2020-03-19 (×6): qty 1

## 2020-03-19 NOTE — Progress Notes (Signed)
PROGRESS NOTE  Malik [name] ZYS:[id number] DOB: 12-Mar-1945 DOA: 03/16/2020 PCP: [facility], [facility], NP   LOS: 3 days   Brief Narrative / Interim history: 75 y.o. 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 He presented to our ED on 12/29 with fever/chills and fatigue and he tested positive for COVID-19.  He left AMA prior to being seen by ED provider. Family reports more lethargy, found to be hypoxic and was sent to ER  Subjective / 24h Interval events: Feeling more comfortable this morning, no chest pain, no abdominal pain.  Mildly confused.  Assessment & Plan:  Principal Problem Acute Hypoxic Respiratory Failure due to Covid-19 Viral Illness, severe sepsis -Elevated lactic acid on admission, resolved with fluids -Procalcitonin low -Chest x-ray on admission showed multifocal pneumonia, consistent with COVID-19.  Of note, on his ED presentation 9 days ago 12/29 chest x-ray also showed findings concerning for pneumonia however family took him home. -Patient was started on steroids, baricitinib, Remdesivir, continue -Remains hypoxic, 2 L this morning.  Wean off as tolerated   COVID-19 Labs  Recent Labs    03/17/20 0540 03/17/20 0739 03/18/20 0438 03/19/20 0543  DDIMER  --  0.85* 0.88* 0.97*  FERRITIN 147  --  141  --   CRP 11.3*  --  6.9*  --     Lab Results  Component Value Date   SARSCOV2NAA POSITIVE (A) 03/17/2020   SARSCOV2NAA POSITIVE (A) 03/08/2020   [place] NEGATIVE 10/05/2019   [place] NEGATIVE 09/28/2019   Active Problems Elevated troponin -Likely demand, trending down, no significant ischemic changes on EKG, initially on heparin infusion but has been discontinued  Chronic diastolic CHF -Appears euvolemic, hold further IV fluids  Prior CVA in 2021 -With  resultant right-sided hemiplegia, aphasia -Continue atorvastatin, previously on dual antiplatelet therapy but stopped after his GI bleed, however saw neurology as an outpatient again in September 2021 and Plavix was recommended at that time. -Family confirms that he is not currently taking any antiplatelets at home  Essential hypertension -Continue metoprolol, amlodipine, monitor blood pressure  DM2 -Placed on sliding scale, monitor given Covid and steroid use, CBGs acceptable  CBG (last 3)  Recent Labs    03/18/20 1650 03/18/20 2121 03/19/20 0840  GLUCAP 205* 216* 213*   History of EtOH -No recent heavy drinking, monitor  Recent GI bleed -Hospitalized after his stroke, at that point he was taken off antiplatelets. D/w son and daughter over the phone, not on any antiplatelets currently   Scheduled Meds: . amLODipine  10 mg Oral Daily  . atorvastatin  80 mg Oral Daily  . baricitinib  4 mg Oral Daily  . enoxaparin (LOVENOX) injection  40 mg Subcutaneous Q24H  . folic acid  1 mg Oral Daily  . insulin aspart  0-15 Units Subcutaneous TID WC  . methylPREDNISolone (SOLU-MEDROL) injection  45 mg Intravenous Q12H  . metoprolol tartrate  25 mg Oral BID  . pantoprazole  40 mg Oral Daily  . thiamine  100 mg Oral Daily   Continuous Infusions: . remdesivir 100 mg in NS 100 mL 100 mg (03/18/20 1009)   PRN Meds:.acetaminophen  DVT prophylaxis: Lovenox Code Status: Full code Family Communication: Discussed with son and daughter over the phone  Status is: Inpatient  Remains inpatient appropriate because:Inpatient level of care appropriate due to severity  of illness   Dispo: The patient is from: Home              Anticipated d/c is to: Home              Anticipated d/c date is: 3 days              Patient currently is not medically stable to d/c.  Consultants:  None  Procedures:  None   Microbiology: None   Antibacterials: None    Objective: Vitals:   03/19/20 0255  03/19/20 0425 03/19/20 0835 03/19/20 1024  BP:   (!) 161/90 (!) 155/88  Pulse:   (!) 58 62  Resp:   18   Temp:   (!) 97.5 F (36.4 C)   TempSrc:   Oral   SpO2: 93%  97%   Weight:  87.5 kg    Height:        Intake/Output Summary (Last 24 hours) at 03/19/2020 1030 Last data filed at 03/19/2020 0700 Gross per 24 hour  Intake 600 ml  Output 1300 ml  Net -700 ml   Filed Weights   03/16/20 0942 03/18/20 0522 03/19/20 0425  Weight: 90.3 kg 88.3 kg 87.5 kg    Examination:  Constitutional: NAD, in bed  Eyes: No scleral icterus ENMT: mmm Neck: normal, supple Respiratory: Bibasilar rhonchi, no wheezing, no crackles, moves air well Cardiovascular: Regular rate and rhythm, no murmurs, no peripheral edema Abdomen: Soft, NT, ND, bowel sounds positive Musculoskeletal: no clubbing / cyanosis.  Skin: No rashes seen Neurologic: right sided weakness, no new focal deficits  Data Reviewed: I have independently reviewed following labs and imaging studies   CBC: Recent Labs  Lab 03/16/20 0947 03/17/20 0540 03/18/20 0438 03/19/20 0543  WBC 10.8* 7.7 8.6 9.9  NEUTROABS 9.6* 6.1 6.9 8.5*  HGB 12.6* 11.0* 11.3* 12.1*  HCT 38.8* 33.4* 33.5* 37.1*  MCV 80.3 79.7* 78.5* 79.3*  PLT 284 290 347 673*   Basic Metabolic Panel: Recent Labs  Lab 03/16/20 0947 03/17/20 0540 03/18/20 0438 03/19/20 0543  NA 141 142 141 141  K 4.0 4.2 4.1 3.8  CL 105 111 110 108  CO2 23 23 23 25   GLUCOSE 251* 177* 253* 226*  BUN 15 16 20 19   CREATININE 1.12 0.92 0.93 0.84  CALCIUM 8.7* 8.2* 8.4* 8.7*  MG  --  1.9 2.2 2.1  PHOS  --  3.5 3.3 3.3   GFR: Estimated Creatinine Clearance: 78.5 mL/min (by C-G formula based on SCr of 0.84 mg/dL). Liver Function Tests: Recent Labs  Lab 03/16/20 0947 03/17/20 0540 03/18/20 0438 03/19/20 0543  AST 54* 41 31 31  ALT 21 17 18 19   ALKPHOS 60 50 54 59  BILITOT 0.8 0.7 0.7 0.7  PROT 8.0 6.5 6.5 6.9  ALBUMIN 3.4* 2.6* 2.5* 2.7*   No results for input(s):  LIPASE, AMYLASE in the last 168 hours. No results for input(s): AMMONIA in the last 168 hours. Coagulation Profile: Recent Labs  Lab 03/16/20 0947  INR 1.0   Cardiac Enzymes: No results for input(s): CKTOTAL, CKMB, CKMBINDEX, TROPONINI in the last 168 hours. BNP (last 3 results) No results for input(s): PROBNP in the last 8760 hours. HbA1C: No results for input(s): HGBA1C in the last 72 hours. CBG: Recent Labs  Lab 03/18/20 0934 03/18/20 1201 03/18/20 1650 03/18/20 2121 03/19/20 0840  GLUCAP 220* 256* 205* 216* 213*   Lipid Profile: No results for input(s): CHOL, HDL, LDLCALC, TRIG, CHOLHDL, LDLDIRECT in the  last 72 hours. Thyroid Function Tests: No results for input(s): TSH, T4TOTAL, FREET4, T3FREE, THYROIDAB in the last 72 hours. Anemia Panel: Recent Labs    03/17/20 0540 03/18/20 0438  FERRITIN 147 141   Urine analysis:    Component Value Date/Time   COLORURINE YELLOW 09/30/2019 1005   APPEARANCEUR CLEAR 09/30/2019 1005   APPEARANCEUR Clear 06/11/2011 2046   LABSPEC 1.014 09/30/2019 1005   LABSPEC 1.025 06/11/2011 2046   PHURINE 7.0 09/30/2019 1005   GLUCOSEU NEGATIVE 09/30/2019 1005   GLUCOSEU Negative 06/11/2011 2046   HGBUR NEGATIVE 09/30/2019 1005   BILIRUBINUR NEGATIVE 09/30/2019 1005   BILIRUBINUR Negative 06/11/2011 2046   KETONESUR NEGATIVE 09/30/2019 1005   PROTEINUR 30 (A) 09/30/2019 1005   NITRITE NEGATIVE 09/30/2019 1005   LEUKOCYTESUR NEGATIVE 09/30/2019 1005   LEUKOCYTESUR Negative 06/11/2011 2046   Sepsis Labs: Invalid input(s): PROCALCITONIN, LACTICIDVEN  Recent Results (from the past 240 hour(s))  Blood Culture (routine x 2)     Status: None (Preliminary result)   Collection Time: 03/16/20  9:47 AM   Specimen: BLOOD LEFT ARM  Result Value Ref Range Status   Specimen Description BLOOD LEFT ARM  Final   Special Requests   Final    BOTTLES DRAWN AEROBIC AND ANAEROBIC Blood Culture adequate volume   Culture   Final    NO GROWTH 3  DAYS Performed at [facility], [address], [place], [place] [address]    Report Status PENDING  Incomplete  Blood Culture (routine x 2)     Status: None (Preliminary result)   Collection Time: 03/16/20  9:47 AM   Specimen: BLOOD  Result Value Ref Range Status   Specimen Description BLOOD LEFT ANTECUBITAL  Final   Special Requests   Final    BOTTLES DRAWN AEROBIC AND ANAEROBIC Blood Culture adequate volume   Culture   Final    NO GROWTH 3 DAYS Performed at [facility], [address], [place], [place] [address]    Report Status PENDING  Incomplete  SARS CORONAVIRUS 2 (TAT 6-24 HRS)     Status: Abnormal   Collection Time: 03/17/20 10:00 AM  Result Value Ref Range Status   SARS Coronavirus 2 POSITIVE (A) NEGATIVE Final    Comment: (NOTE) SARS-CoV-2 target nucleic acids are DETECTED.  The SARS-CoV-2 RNA is generally detectable in upper and lower respiratory specimens during the acute phase of infection. Positive results are indicative of the presence of SARS-CoV-2 RNA. Clinical correlation with patient history and other diagnostic information is  necessary to determine patient infection status. Positive results do not rule out bacterial infection or co-infection with other viruses.  The expected result is Negative.  Fact Sheet for Patients: SugarRoll.be  Fact Sheet for Healthcare Providers: https://www.woods-mathews.com/  This test is not yet approved or cleared by the [place] FDA and  has been authorized for detection and/or diagnosis of SARS-CoV-2 by FDA under an Emergency Use Authorization (EUA). This EUA will remain  in effect (meaning this test can be used) for the duration of the COVID-19 declaration under Section 564(b)(1) of the Act, 21 U. S.C. section 360bbb-3(b)(1), unless the authorization is terminated or revoked sooner.   Performed at [facility], [place] [address], [place],  [place] [address]       Radiology Studies: No results found.  Marzetta Board, MD, PhD [facility]  Between 7 am - 7 pm I am available, please contact me via Amion or Securechat  Between 7 pm - 7  am I am not available, please contact night coverage MD/APP via Amion

## 2020-03-20 DIAGNOSIS — U071 COVID-19: Secondary | ICD-10-CM | POA: Diagnosis not present

## 2020-03-20 DIAGNOSIS — J1282 Pneumonia due to coronavirus disease 2019: Secondary | ICD-10-CM | POA: Diagnosis not present

## 2020-03-20 LAB — CBC WITH DIFFERENTIAL/PLATELET
Abs Immature Granulocytes: 0.12 10*3/uL — ABNORMAL HIGH (ref 0.00–0.07)
Basophils Absolute: 0 10*3/uL (ref 0.0–0.1)
Basophils Relative: 0 %
Eosinophils Absolute: 0 10*3/uL (ref 0.0–0.5)
Eosinophils Relative: 0 %
HCT: 40.8 % (ref 39.0–52.0)
Hemoglobin: 13.4 g/dL (ref 13.0–17.0)
Immature Granulocytes: 1 %
Lymphocytes Relative: 6 %
Lymphs Abs: 0.8 10*3/uL (ref 0.7–4.0)
MCH: 25.9 pg — ABNORMAL LOW (ref 26.0–34.0)
MCHC: 32.8 g/dL (ref 30.0–36.0)
MCV: 78.9 fL — ABNORMAL LOW (ref 80.0–100.0)
Monocytes Absolute: 0.5 10*3/uL (ref 0.1–1.0)
Monocytes Relative: 4 %
Neutro Abs: 10.9 10*3/uL — ABNORMAL HIGH (ref 1.7–7.7)
Neutrophils Relative %: 89 %
Platelets: 442 10*3/uL — ABNORMAL HIGH (ref 150–400)
RBC: 5.17 MIL/uL (ref 4.22–5.81)
RDW: 20.8 % — ABNORMAL HIGH (ref 11.5–15.5)
WBC: 12.3 10*3/uL — ABNORMAL HIGH (ref 4.0–10.5)
nRBC: 0.6 % — ABNORMAL HIGH (ref 0.0–0.2)

## 2020-03-20 LAB — GLUCOSE, CAPILLARY
Glucose-Capillary: 278 mg/dL — ABNORMAL HIGH (ref 70–99)
Glucose-Capillary: 243 mg/dL — ABNORMAL HIGH (ref 70–99)
Glucose-Capillary: 204 mg/dL — ABNORMAL HIGH (ref 70–99)
Glucose-Capillary: 161 mg/dL — ABNORMAL HIGH (ref 70–99)

## 2020-03-20 LAB — COMPREHENSIVE METABOLIC PANEL
ALT: 28 U/L (ref 0–44)
AST: 45 U/L — ABNORMAL HIGH (ref 15–41)
Albumin: 3.3 g/dL — ABNORMAL LOW (ref 3.5–5.0)
Alkaline Phosphatase: 67 U/L (ref 38–126)
Anion gap: 16 — ABNORMAL HIGH (ref 5–15)
BUN: 20 mg/dL (ref 8–23)
CO2: 23 mmol/L (ref 22–32)
Calcium: 8.8 mg/dL — ABNORMAL LOW (ref 8.9–10.3)
Chloride: 102 mmol/L (ref 98–111)
Creatinine, Ser: 1 mg/dL (ref 0.61–1.24)
GFR, Estimated: >60 mL/min (ref >60)
Glucose, Bld: 271 mg/dL — ABNORMAL HIGH (ref 70–99)
Potassium: 3.7 mmol/L (ref 3.5–5.1)
Sodium: 141 mmol/L (ref 135–145)
Total Bilirubin: 1.1 mg/dL (ref 0.3–1.2)
Total Protein: 7.9 g/dL (ref 6.5–8.1)

## 2020-03-20 LAB — D-DIMER, QUANTITATIVE: D-Dimer, Quant: 2.2 ug/mL-FEU — ABNORMAL HIGH (ref 0.00–0.50)

## 2020-03-20 LAB — MAGNESIUM: Magnesium: 1.9 mg/dL (ref 1.7–2.4)

## 2020-03-20 LAB — PHOSPHORUS: Phosphorus: 3.5 mg/dL (ref 2.5–4.6)

## 2020-03-20 LAB — C-REACTIVE PROTEIN: CRP: 3.2 mg/dL — ABNORMAL HIGH (ref <1.0)

## 2020-03-20 NOTE — Progress Notes (Signed)
PROGRESS NOTE  Malik [name] [id number] DOB: 1945-04-18 DOA: 03/16/2020 PCP: [facility], [place], NP   LOS: 4 days   Brief Narrative / Interim history: 75 y.o. 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 He presented to our ED on 12/29 with fever/chills and fatigue and he tested positive for COVID-19.  He left AMA prior to being seen by ED provider. Family reports more lethargy, found to be hypoxic and was sent to ER  Subjective / 24h Interval events: Overall comfortable.  No chest pain, no shortness of breath.  Mildly confused.  Assessment & Plan:  Principal Problem Acute Hypoxic Respiratory Failure due to Covid-19 Viral Illness, severe sepsis -Elevated lactic acid on admission, resolved with fluids -Procalcitonin low -Chest x-ray on admission showed multifocal pneumonia, consistent with COVID-19.  Of note, on his ED presentation 9 days ago 12/29 chest x-ray also showed findings concerning for pneumonia however family took him home. -Patient was started on steroids, baricitinib, Remdesivir, continue -Started on 12 L.  He appears more comfortable.  Wean off as tolerated   COVID-19 Labs  Recent Labs    03/18/20 0438 03/19/20 0543 03/20/20 0456  DDIMER 0.88* 0.97*  --   FERRITIN 141  --   --   CRP 6.9* 2.9* 3.2*    Lab Results  Component Value Date   SARSCOV2NAA POSITIVE (A) 03/17/2020   SARSCOV2NAA POSITIVE (A) 03/08/2020   [place] NEGATIVE 10/05/2019   [place] NEGATIVE 09/28/2019   Active Problems Elevated troponin -Likely demand, trending down, no significant ischemic changes on EKG, initially on heparin infusion but has been discontinued  Chronic diastolic CHF -Appears euvolemic, hold further IV fluids  Prior CVA in 2021 -With resultant right-sided  hemiplegia, aphasia -Continue atorvastatin, previously on dual antiplatelet therapy but stopped after his GI bleed, however saw neurology as an outpatient again in September 2021 and Plavix was recommended at that time. -Family confirms that he is not currently taking any antiplatelets at home  Essential hypertension -Continue metoprolol, amlodipine, monitor blood pressure  DM2 -Placed on sliding scale, monitor given Covid and steroid use, CBGs acceptable  CBG (last 3)  Recent Labs    03/19/20 1807 03/19/20 2100 03/20/20 0829  GLUCAP 151* 187* 278*   History of EtOH -No recent heavy drinking, monitor  Recent GI bleed -Hospitalized after his stroke, at that point he was taken off antiplatelets. D/w son and daughter over the phone, not on any antiplatelets currently   Scheduled Meds: . amLODipine  10 mg Oral Daily  . atorvastatin  80 mg Oral Daily  . baricitinib  4 mg Oral Daily  . enoxaparin (LOVENOX) injection  40 mg Subcutaneous Q24H  . folic acid  1 mg Oral Daily  . insulin aspart  0-15 Units Subcutaneous TID WC  . methylPREDNISolone (SOLU-MEDROL) injection  45 mg Intravenous Q12H  . metoprolol tartrate  25 mg Oral BID  . pantoprazole  40 mg Oral Daily  . thiamine  100 mg Oral Daily   Continuous Infusions:  PRN Meds:.acetaminophen, hydrALAZINE  DVT prophylaxis: Lovenox Code Status: Full code Family Communication: Discussed with son and daughter over the phone  Status is: Inpatient  Remains inpatient appropriate because:Inpatient level of care appropriate due to severity of illness   Dispo: The patient is from: Home  Anticipated d/c is to: Home              Anticipated d/c date is: 3 days              Patient currently is not medically stable to d/c.  Consultants:  None  Procedures:  None   Microbiology: None   Antibacterials: None    Objective: Vitals:   03/20/20 0539 03/20/20 0820 03/20/20 0827 03/20/20 0836  BP: (!) 149/85   (!)  176/84  Pulse: 72  (!) 101 89  Resp:   20   Temp:   98 F (36.7 C)   TempSrc:   Oral   SpO2:  (!) 84% 94%   Weight:      Height:        Intake/Output Summary (Last 24 hours) at 03/20/2020 1113 Last data filed at 03/20/2020 0505 Gross per 24 hour  Intake 120 ml  Output 1200 ml  Net -1080 ml   Filed Weights   03/16/20 0942 03/18/20 0522 03/19/20 0425  Weight: 90.3 kg 88.3 kg 87.5 kg    Examination:  Constitutional: No distress, in bed Eyes: No icterus ENMT: mmm Neck: normal, supple Respiratory: Bibasilar rhonchi, no wheezing, no crackles Cardiovascular: Regular rate and rhythm, no murmurs, no edema Abdomen: Soft, NT, ND, bowel sounds positive Musculoskeletal: no clubbing / cyanosis.  Skin: No rashes seen Neurologic: right sided weakness, no new focal deficits  Data Reviewed: I have independently reviewed following labs and imaging studies   CBC: Recent Labs  Lab 03/16/20 0947 03/17/20 0540 03/18/20 0438 03/19/20 0543 03/20/20 0456  WBC 10.8* 7.7 8.6 9.9 12.3*  NEUTROABS 9.6* 6.1 6.9 8.5* 10.9*  HGB 12.6* 11.0* 11.3* 12.1* 13.4  HCT 38.8* 33.4* 33.5* 37.1* 40.8  MCV 80.3 79.7* 78.5* 79.3* 78.9*  PLT 284 290 347 409* 824*   Basic Metabolic Panel: Recent Labs  Lab 03/16/20 0947 03/17/20 0540 03/18/20 0438 03/19/20 0543 03/20/20 0456  NA 141 142 141 141 141  K 4.0 4.2 4.1 3.8 3.7  CL 105 111 110 108 102  CO2 23 23 23 25 23   GLUCOSE 251* 177* 253* 226* 271*  BUN 15 16 20 19 20   CREATININE 1.12 0.92 0.93 0.84 1.00  CALCIUM 8.7* 8.2* 8.4* 8.7* 8.8*  MG  --  1.9 2.2 2.1 1.9  PHOS  --  3.5 3.3 3.3 3.5   GFR: Estimated Creatinine Clearance: 65.9 mL/min (by C-G formula based on SCr of 1 mg/dL). Liver Function Tests: Recent Labs  Lab 03/16/20 0947 03/17/20 0540 03/18/20 0438 03/19/20 0543 03/20/20 0456  AST 54* 41 31 31 45*  ALT 21 17 18 19 28   ALKPHOS 60 50 54 59 67  BILITOT 0.8 0.7 0.7 0.7 1.1  PROT 8.0 6.5 6.5 6.9 7.9  ALBUMIN 3.4* 2.6* 2.5*  2.7* 3.3*   No results for input(s): LIPASE, AMYLASE in the last 168 hours. No results for input(s): AMMONIA in the last 168 hours. Coagulation Profile: Recent Labs  Lab 03/16/20 0947  INR 1.0   Cardiac Enzymes: No results for input(s): CKTOTAL, CKMB, CKMBINDEX, TROPONINI in the last 168 hours. BNP (last 3 results) No results for input(s): PROBNP in the last 8760 hours. HbA1C: No results for input(s): HGBA1C in the last 72 hours. CBG: Recent Labs  Lab 03/19/20 0840 03/19/20 1203 03/19/20 1807 03/19/20 2100 03/20/20 0829  GLUCAP 213* 210* 151* 187* 278*   Lipid Profile: No results for input(s): CHOL, HDL, LDLCALC, TRIG, CHOLHDL, LDLDIRECT in the last  72 hours. Thyroid Function Tests: No results for input(s): TSH, T4TOTAL, FREET4, T3FREE, THYROIDAB in the last 72 hours. Anemia Panel: Recent Labs    03/18/20 0438  FERRITIN 141   Urine analysis:    Component Value Date/Time   COLORURINE YELLOW 09/30/2019 1005   APPEARANCEUR CLEAR 09/30/2019 1005   APPEARANCEUR Clear 06/11/2011 2046   LABSPEC 1.014 09/30/2019 1005   LABSPEC 1.025 06/11/2011 2046   PHURINE 7.0 09/30/2019 1005   GLUCOSEU NEGATIVE 09/30/2019 1005   GLUCOSEU Negative 06/11/2011 2046   HGBUR NEGATIVE 09/30/2019 1005   BILIRUBINUR NEGATIVE 09/30/2019 1005   BILIRUBINUR Negative 06/11/2011 2046   KETONESUR NEGATIVE 09/30/2019 1005   PROTEINUR 30 (A) 09/30/2019 1005   NITRITE NEGATIVE 09/30/2019 1005   LEUKOCYTESUR NEGATIVE 09/30/2019 1005   LEUKOCYTESUR Negative 06/11/2011 2046   Sepsis Labs: Invalid input(s): PROCALCITONIN, LACTICIDVEN  Recent Results (from the past 240 hour(s))  Blood Culture (routine x 2)     Status: None (Preliminary result)   Collection Time: 03/16/20  9:47 AM   Specimen: BLOOD LEFT ARM  Result Value Ref Range Status   Specimen Description BLOOD LEFT ARM  Final   Special Requests   Final    BOTTLES DRAWN AEROBIC AND ANAEROBIC Blood Culture adequate volume   Culture   Final     NO GROWTH 4 DAYS Performed at [facility], [address] [place], [place] [address]    Report Status PENDING  Incomplete  Blood Culture (routine x 2)     Status: None (Preliminary result)   Collection Time: 03/16/20  9:47 AM   Specimen: BLOOD  Result Value Ref Range Status   Specimen Description BLOOD LEFT ANTECUBITAL  Final   Special Requests   Final    BOTTLES DRAWN AEROBIC AND ANAEROBIC Blood Culture adequate volume   Culture   Final    NO GROWTH 4 DAYS Performed at [facility], [address] [place], [place] [address]    Report Status PENDING  Incomplete  SARS CORONAVIRUS 2 (TAT 6-24 HRS)     Status: Abnormal   Collection Time: 03/17/20 10:00 AM  Result Value Ref Range Status   SARS Coronavirus 2 POSITIVE (A) NEGATIVE Final    Comment: (NOTE) SARS-CoV-2 target nucleic acids are DETECTED.  The SARS-CoV-2 RNA is generally detectable in upper and lower respiratory specimens during the acute phase of infection. Positive results are indicative of the presence of SARS-CoV-2 RNA. Clinical correlation with patient history and other diagnostic information is  necessary to determine patient infection status. Positive results do not rule out bacterial infection or co-infection with other viruses.  The expected result is Negative.  Fact Sheet for Patients: SugarRoll.be  Fact Sheet for Healthcare Providers: https://www.woods-mathews.com/  This test is not yet approved or cleared by the [place] FDA and  has been authorized for detection and/or diagnosis of SARS-CoV-2 by FDA under an Emergency Use Authorization (EUA). This EUA will remain  in effect (meaning this test can be used) for the duration of the COVID-19 declaration under Section 564(b)(1) of the Act, 21 U. S.C. section 360bbb-3(b)(1), unless the authorization is terminated or revoked sooner.   Performed at [facility], [place] [address],  [place], [place] [address]       Radiology Studies: No results found.  Marzetta Board, MD, PhD [facility]  Between 7 am - 7 pm I am available, please contact me via Amion or Securechat  Between 7 pm - 7 am I am not  available, please contact night coverage MD/APP via Amion

## 2020-03-20 NOTE — Evaluation (Signed)
Physical Therapy Evaluation Patient Details Name: Malik [name] MRN: [id number] DOB: August 09, 1945 Today's Date: 03/20/2020   History of Present Illness  [name] 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 He presented to our ED on 12/29 with fever/chills and fatigue and he tested positive for COVID-19.  He left AMA prior to being seen by ED provider. Family reports more lethargy, found to be hypoxic and was sent back to the ER.  MD assessment includes: Acute Hypoxic Respiratory Failure due to Covid-19 viral illness, severe sepsis, elevated troponin likely demand ischemia, and recent GI bleed.    Clinical Impression  Pt with expressive aphasia and unable to communicate during the session.  Pt also found to have significant difficulty following commands and required extra time and multi-modal cuing and was still able to follow simple commands less than 75% of the time.  Ultimately pt required +2 physical assist for transfers and ambulation and was unsteady on his feet with difficulty advancing his RLE.  Pt's SpO2 reading was poor with difficulty obtaining a reading but was in the upper 90s when a reading could be obtained on 10LO2/min HFNC.  Pt is at a high risk for falls and would not be safe to return to his prior living situation at this time. Pt will benefit from PT services in a SNF setting upon discharge to safely address deficits listed in patient problem list for decreased caregiver assistance and eventual return to PLOF.     Follow Up Recommendations SNF    Equipment Recommendations  None recommended by PT    Recommendations for Other Services       Precautions / Restrictions Precautions Precautions: Fall Restrictions Weight Bearing Restrictions: No Other Position/Activity Restrictions: Keep  O2 >94%      Mobility  Bed Mobility Overal bed mobility: Needs Assistance Bed Mobility: Sit to Supine;Supine to Sit     Supine to sit: Mod assist Sit to supine: Mod assist   General bed mobility comments: Mod A for BLE and trunk control    Transfers Overall transfer level: Needs assistance Equipment used: 2 person hand held assist Transfers: Sit to/from Stand Sit to Stand: Mod assist;+2 physical assistance         General transfer comment: Pt required +2 mod A during sit to/from stand transfers from various height surfaces  Ambulation/Gait Ambulation/Gait assistance: Min assist Gait Distance (Feet): 2 Feet Assistive device: 2 person hand held assist Gait Pattern/deviations: Step-to pattern;Decreased stance time - right;Shuffle Gait velocity: decreased   General Gait Details: +2 min A for stability during limited ambulation with poor R foot clearance when advancing the RLE resulting in mostly shuffling the foot  Stairs            Wheelchair Mobility    Modified Rankin (Stroke Patients Only)       Balance Overall balance assessment: Needs assistance Sitting-balance support: Feet supported;Single extremity supported Sitting balance-Leahy Scale: Good     Standing balance support: Bilateral upper extremity supported;During functional activity Standing balance-Leahy Scale: Poor                               Pertinent Vitals/Pain Pain Assessment: Faces Faces Pain Scale: No hurt    Home Living Family/patient expects to be discharged to:: Private residence  Living Arrangements: Alone Available Help at Discharge: Personal care attendant;Available 24 hours/day Type of Home: House Home Access: Stairs to enter Entrance Stairs-Rails: Left Entrance Stairs-Number of Steps: 2 Home Layout: One level Home Equipment: Cane - quad Additional Comments: Pt has a PCA 24/7; history provided via phone call with son secondary to pt non-verbal with expressive  aphasia    Prior Function Level of Independence: Needs assistance   Gait / Transfers Assistance Needed: SBA with ambulation limited community distances with a QC, SBA/min A with bed mobiltiy tasks, SBA with transfers, no falls in the last 6 months  ADL's / Homemaking Assistance Needed: PCA assists with all ADLs        Hand Dominance   Dominant Hand: Right    Extremity/Trunk Assessment   Upper Extremity Assessment Upper Extremity Assessment: Generalized weakness;RUE deficits/detail RUE Deficits / Details: History of R-sided hemiparesis    Lower Extremity Assessment Lower Extremity Assessment: Generalized weakness;RLE deficits/detail RLE Deficits / Details: History of R-sided hemiparesis       Communication   Communication: Expressive difficulties;Receptive difficulties  Cognition Arousal/Alertness: Awake/alert Behavior During Therapy: WFL for tasks assessed/performed Overall Cognitive Status: Difficult to assess                                        General Comments      Exercises Other Exercises Other Exercises: Static unsupported sitting at EOB for improved activity tolernace and core strengthening Other Exercises: Multiple sit to/from stand transfer training from various height surfaces   Assessment/Plan    PT Assessment Patient needs continued PT services  PT Problem List Decreased strength;Decreased range of motion;Decreased activity tolerance;Decreased balance;Decreased mobility;Decreased knowledge of use of DME;Decreased safety awareness;Impaired tone       PT Treatment Interventions DME instruction;Gait training;Stair training;Functional mobility training;Therapeutic activities;Therapeutic exercise;Balance training;Patient/family education    PT Goals (Current goals can be found in the Care Plan section)  Acute Rehab PT Goals PT Goal Formulation: Patient unable to participate in goal setting Time For Goal Achievement: 04/02/20 Potential  to Achieve Goals: Fair    Frequency Min 2X/week   Barriers to discharge Inaccessible home environment      Co-evaluation               AM-PAC PT "6 Clicks" Mobility  Outcome Measure Help needed turning from your back to your side while in a flat bed without using bedrails?: A Lot Help needed moving from lying on your back to sitting on the side of a flat bed without using bedrails?: A Lot Help needed moving to and from a bed to a chair (including a wheelchair)?: A Lot Help needed standing up from a chair using your arms (e.g., wheelchair or bedside chair)?: A Lot Help needed to walk in hospital room?: A Lot Help needed climbing 3-5 steps with a railing? : Total 6 Click Score: 11    End of Session Equipment Utilized During Treatment: Gait belt;Oxygen Activity Tolerance: Patient tolerated treatment well Patient left: in bed;with nursing/sitter in room;with call bell/phone within reach;with bed alarm set Nurse Communication: Mobility status PT Visit Diagnosis: Unsteadiness on feet (R26.81);Other abnormalities of gait and mobility (R26.89);Muscle weakness (generalized) (M62.81)    Time: 4696-2952 PT Time Calculation (min) (ACUTE ONLY): 63 min   Charges:   PT Evaluation $PT Eval Moderate Complexity: 1 Mod PT Treatments $Therapeutic Activity: 23-37 mins  Linus Salmons PT, DPT 03/20/20, 4:45 PM

## 2020-03-20 NOTE — Plan of Care (Signed)

## 2020-03-21 DIAGNOSIS — J1282 Pneumonia due to coronavirus disease 2019: Secondary | ICD-10-CM | POA: Diagnosis not present

## 2020-03-21 DIAGNOSIS — U071 COVID-19: Secondary | ICD-10-CM | POA: Diagnosis not present

## 2020-03-21 LAB — COMPREHENSIVE METABOLIC PANEL
ALT: 24 U/L (ref 0–44)
AST: 33 U/L (ref 15–41)
Albumin: 3.1 g/dL — ABNORMAL LOW (ref 3.5–5.0)
Alkaline Phosphatase: 64 U/L (ref 38–126)
Anion gap: 14 (ref 5–15)
BUN: 23 mg/dL (ref 8–23)
CO2: 23 mmol/L (ref 22–32)
Calcium: 9 mg/dL (ref 8.9–10.3)
Chloride: 107 mmol/L (ref 98–111)
Creatinine, Ser: 0.95 mg/dL (ref 0.61–1.24)
GFR, Estimated: >60 mL/min (ref >60)
Glucose, Bld: 183 mg/dL — ABNORMAL HIGH (ref 70–99)
Potassium: 3.7 mmol/L (ref 3.5–5.1)
Sodium: 144 mmol/L (ref 135–145)
Total Bilirubin: 1.2 mg/dL (ref 0.3–1.2)
Total Protein: 7.5 g/dL (ref 6.5–8.1)

## 2020-03-21 LAB — CULTURE, BLOOD (ROUTINE X 2)
Culture: NO GROWTH
Culture: NO GROWTH
Special Requests: ADEQUATE
Special Requests: ADEQUATE

## 2020-03-21 LAB — CBC WITH DIFFERENTIAL/PLATELET
Abs Immature Granulocytes: 0.12 10*3/uL — ABNORMAL HIGH (ref 0.00–0.07)
Basophils Absolute: 0 10*3/uL (ref 0.0–0.1)
Basophils Relative: 0 %
Eosinophils Absolute: 0 10*3/uL (ref 0.0–0.5)
Eosinophils Relative: 0 %
HCT: 41.1 % (ref 39.0–52.0)
Hemoglobin: 13.8 g/dL (ref 13.0–17.0)
Immature Granulocytes: 1 %
Lymphocytes Relative: 13 %
Lymphs Abs: 1.5 10*3/uL (ref 0.7–4.0)
MCH: 26.2 pg (ref 26.0–34.0)
MCHC: 33.6 g/dL (ref 30.0–36.0)
MCV: 78.1 fL — ABNORMAL LOW (ref 80.0–100.0)
Monocytes Absolute: 1 10*3/uL (ref 0.1–1.0)
Monocytes Relative: 9 %
Neutro Abs: 9 10*3/uL — ABNORMAL HIGH (ref 1.7–7.7)
Neutrophils Relative %: 77 %
Platelets: 515 10*3/uL — ABNORMAL HIGH (ref 150–400)
RBC: 5.26 MIL/uL (ref 4.22–5.81)
RDW: 21.2 % — ABNORMAL HIGH (ref 11.5–15.5)
Smear Review: NORMAL
WBC: 11.7 10*3/uL — ABNORMAL HIGH (ref 4.0–10.5)
nRBC: 0.5 % — ABNORMAL HIGH (ref 0.0–0.2)

## 2020-03-21 LAB — GLUCOSE, CAPILLARY
Glucose-Capillary: 178 mg/dL — ABNORMAL HIGH (ref 70–99)
Glucose-Capillary: 263 mg/dL — ABNORMAL HIGH (ref 70–99)
Glucose-Capillary: 369 mg/dL — ABNORMAL HIGH (ref 70–99)
Glucose-Capillary: 74 mg/dL (ref 70–99)

## 2020-03-21 LAB — D-DIMER, QUANTITATIVE: D-Dimer, Quant: 2.16 ug/mL-FEU — ABNORMAL HIGH (ref 0.00–0.50)

## 2020-03-21 LAB — C-REACTIVE PROTEIN: CRP: 2.6 mg/dL — ABNORMAL HIGH (ref <1.0)

## 2020-03-21 LAB — PHOSPHORUS: Phosphorus: 3.2 mg/dL (ref 2.5–4.6)

## 2020-03-21 LAB — MAGNESIUM: Magnesium: 2.2 mg/dL (ref 1.7–2.4)

## 2020-03-21 NOTE — Progress Notes (Addendum)
Speech Language Pathology Treatment: Dysphagia  Patient Details Name: Malik [name] MRN: [id number] DOB: 01-17-1946 Today's Date: 03/21/2020 Time: 0920-1005 SLP Time Calculation (min) (ACUTE ONLY): 45 min  Assessment / Plan / Recommendation Clinical Impression  Pt seen for ongoing assessment of toleration of diet; trials at breakfast meal w/ SLP. Pt's BP is High per NSG; pt appears to present w/ min slower engagement and energy w/ eyes closed often. He required verbal and visual/tactile cues intermittently for follow through w/ tasks. Pt is mostly nonverbal w/ baseline Aphasia but he did nod/shake his head for communication w/ SLP to indicate wants.  Pt appears to present w/ grossly functional swallowing though w/ mild oropharyngeal phase dysphagia noted best supported by following general aspiration precautions(single sips of thin liquids, small bites/sips). Neuromuscular deficits noted per prior MBSS in 08/2019; BSEs performed in 09/2019. Unsure if his insight and follow through w/ aspiration precautions d/t baseline Aphasia s/p stroke, ETOH abuse. When aspiration precautions were implemented and followed during this breakfast meal, and pt supported during feeding of po's, No overt, gross clinical s/s of aspiration during po trials. Pt appears at reduced risk for aspiration following general aspiration precautions. During po trials, pt consumed all consistencies w/ no immediate overt coughing, decline in vocal quality, or change in respiratory presentation during/post trials. Suspect a delay in pharyngeal swallow initiation per his presentation when sipping liquids via cup/straw; an audible swallow was noted intermittently. Sips were limited to 1-2 at a time via tilting back on Cup or Pinching Straw to lessen risk for aspiration. Oral phase appeared min impacted by his decreased State and energy of engagement: min more time needed for bolus management, mastication, and propulsion for A-P transfer for  swallowing. Min increased Time needed to fully masticate/clear the more solid textured foods -- moistening of foods added to ease effort. Pt is also Missing Dentition Baseline which impacts effectiveness of mastication. Oral clearing achieved w/ all trial consistencies given Time and alternating foods/liquids. Pt appeared to do best drinking an Ensure given support holding the cup. Speech volume low, mumbled y/ns sec. to Aphasia Baseline. Pt supported fully during feeding of trials d/t overall weakness.   Recommend continue a Mech Soft consistency diet w/ Minced meats, moistened foods/gravies; Thin liquids 1-2 SIPS at a time via Cup/Straw monitored. Recommend general aspiration precautions, Pills WHOLE vs Crushed in Puree for safer, easier swallowing per NSG. Support w/ feeding at all meals. Suspect pt is at increased risk for aspiration d/t prior oropharyngeal phase dysphagia and Cognitive-linguistic deficits from CVA, ETOH abuse. Current illness and weakness can further increase risk for aspiration. Education given on Pills in Puree; food consistency; general aspiration precautions for room, chart. NSG/MD updated.    HPI HPI: Pt is a 75 y.o. male with medical history significant for CVA in 2021 with resulting RUE hemiplegia and aphasia, htn, hfpef, dm, recent admission for upper GI bleed thought to be 2/2 angiodysplasia, ETOH abuse, iron deficiency anemia, DM2, CKD3a, HTN, OSA, HLD, alcohol abuse who presents with hypoxia.  Presented to our ED on 02/28/2020 with fever/chills and fatigue. Covid tested, result was positive. Patient left AMA prior to being seen by a provider.  Son reports that for the past week condition has waxed and waned. At times normal self in terms of mentation. Baseline is bedbound and has Aphasia, responds with yes/no to basic questions. At other times seems lethargic. Today SLP came for his Speech therapy and noticed patient to be lethargic. Pulse ox revealed o2 in the  70s so EMS was  alerted.  Currently on [place] O2 support.  CXR: Increased airspace opacities bilaterally. Covid+; O2 support.      SLP Plan  Continue with current plan of care       Recommendations  Diet recommendations: Dysphagia 3 (mechanical soft);Thin liquid (minced meats) Liquids provided via: Cup;Straw (monitor) Medication Administration: Crushed with puree (as needed vs Whole for safer swallowing) Supervision: Staff to assist with self feeding;Full supervision/cueing for compensatory strategies Compensations: Minimize environmental distractions;Slow rate;Small sips/bites;Lingual sweep for clearance of pocketing;Multiple dry swallows after each bite/sip;Follow solids with liquid Postural Changes and/or Swallow Maneuvers: Seated upright 90 degrees;Upright 30-60 min after meal                General recommendations:  (Dietician f/u) Oral Care Recommendations: Oral care BID;Oral care before and after PO;Staff/trained caregiver to provide oral care Follow up Recommendations:  (TBD) SLP Visit Diagnosis: Dysphagia, oropharyngeal phase (R13.12);Aphasia (R47.01) ((baseline Aphasia)) Plan: Continue with current plan of care       GO                 Malik Kenner, MS, CCC-SLP Speech Language Pathologist Rehab Services [phone] [facility] 03/21/2020, 11:54 AM

## 2020-03-21 NOTE — Plan of Care (Signed)

## 2020-03-21 NOTE — Progress Notes (Signed)
PROGRESS NOTE  Malik [name] ZOX:[id number] DOB: [birth year] DOA: 03/16/2020 PCP: [facility], NP   LOS: 5 days   Brief Narrative / Interim history: 75 y.o. 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 He presented to our ED on 12/29 with fever/chills and fatigue and he tested positive for COVID-19.  Family and patient left prior to being seen by ED provider back then. Family reports more lethargy, progressive dyspnea, found to be hypoxic and was sent to ER on 1/6  Subjective / 24h Interval events: Overall comfortable, denies any chest pain, denies any shortness of breath.  Has mild confusion  Assessment & Plan:  Principal Problem Acute Hypoxic Respiratory Failure due to Covid-19 Viral Illness, severe sepsis -Elevated lactic acid on admission, resolved with fluids -Procalcitonin low -Chest x-ray on admission showed multifocal pneumonia, consistent with COVID-19.  Of note, on his ED presentation 9 days ago 12/29 chest x-ray also showed findings concerning for pneumonia however family took him home. -Patient was started on steroids, baricitinib, continue.  Status post 5 days of remdesivir. -Weaned down to 10 L now, appears more comfortable, wean off as tolerated   COVID-19 Labs  Recent Labs    03/19/20 0543 03/20/20 0456  DDIMER 0.97* 2.20*  CRP 2.9* 3.2*    Lab Results  Component Value Date   SARSCOV2NAA POSITIVE (A) 03/17/2020   SARSCOV2NAA POSITIVE (A) 03/08/2020   SARSCOV2NAA NEGATIVE 10/05/2019   SARSCOV2NAA NEGATIVE 09/28/2019   Active Problems Elevated troponin -Likely demand, trending down, no significant ischemic changes on EKG, initially on heparin infusion but this has been discontinued.  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  He apparently was placed on Plavix and aspirin was discontinued at that time.  Follow-up with neurology in November 21 recommended to continue Plavix, however family says he is not taking any antiplatelets at home.  Needs outpatient follow-up with neurology  Essential hypertension -Continue metoprolol, amlodipine, monitor blood pressure  DM2 -Placed on sliding scale, monitor given Covid and steroid use, CBGs acceptable  CBG (last 3)  Recent Labs    03/20/20 1115 03/20/20 1617 03/20/20 1956  GLUCAP 243* 204* 161*   History of EtOH -No recent heavy drinking, monitor  Recent GI bleed -Hospitalized after his stroke, at that point he was taken off antiplatelets. D/w son and daughter over the phone, not on any antiplatelets currently   Scheduled Meds: . amLODipine  10 mg Oral Daily  . atorvastatin  80 mg Oral Daily  . baricitinib  4 mg Oral Daily  . enoxaparin (LOVENOX) injection  40 mg Subcutaneous Q24H  . folic acid  1 mg Oral Daily  . insulin aspart  0-15 Units Subcutaneous TID WC  . methylPREDNISolone (SOLU-MEDROL) injection  45 mg Intravenous Q12H  . metoprolol tartrate  25 mg Oral BID  . pantoprazole  40 mg Oral Daily  . thiamine  100 mg Oral Daily   Continuous Infusions:  PRN Meds:.acetaminophen, hydrALAZINE  DVT prophylaxis: Lovenox Code Status: Full code Family Communication: Discussed with son and  daughter over the phone  Status is: Inpatient  Remains inpatient appropriate because:Inpatient level of care appropriate due to severity of illness   Dispo: The patient is from: Home              Anticipated d/c is to: Home              Anticipated d/c date is: 3 days               Patient currently is not medically stable to d/c.  Consultants:  None  Procedures:  None   Microbiology: None   Antibacterials: None    Objective: Vitals:   03/20/20 1954 03/20/20 1955 03/21/20 0435 03/21/20 0818  BP: (!) 175/139 (!) 178/88 (!) 168/71 (!) 192/96  Pulse: 89 67 86 75  Resp: 18  17 17   Temp: 97.7 F (36.5 C)  97.7 F (36.5 C) 98.5 F (36.9 C)  TempSrc: Oral   Oral  SpO2: 100% 100% 99% 98%  Weight:   87.1 kg   Height:        Intake/Output Summary (Last 24 hours) at 03/21/2020 0934 Last data filed at 03/20/2020 2020 Gross per 24 hour  Intake 80 ml  Output 300 ml  Net -220 ml   Filed Weights   03/18/20 0522 03/19/20 0425 03/21/20 0435  Weight: 88.3 kg 87.5 kg 87.1 kg    Examination:  Constitutional: NAD, in bed Eyes: No scleral icterus ENMT: Moist mucous membranes Neck: normal, supple Respiratory: Bibasilar rhonchi, no wheezing, no crackles Cardiovascular: Regular rate and rhythm, no murmurs, no peripheral edema Abdomen: Soft, NT, ND, bowel sounds positive Musculoskeletal: no clubbing / cyanosis.  Skin: No rashes seen Neurologic: Right-sided weakness, chronic, no new focal deficits  Data Reviewed: I have independently reviewed following labs and imaging studies   CBC: Recent Labs  Lab 03/17/20 0540 03/18/20 0438 03/19/20 0543 03/20/20 0456 03/21/20 0455  WBC 7.7 8.6 9.9 12.3* 11.7*  NEUTROABS 6.1 6.9 8.5* 10.9* 9.0*  HGB 11.0* 11.3* 12.1* 13.4 13.8  HCT 33.4* 33.5* 37.1* 40.8 41.1  MCV 79.7* 78.5* 79.3* 78.9* 78.1*  PLT 290 347 409* 442* 119*   Basic Metabolic Panel: Recent Labs  Lab 03/17/20 0540 03/18/20 0438 03/19/20 0543 03/20/20 0456 03/21/20 0455  NA 142 141 141 141 144  K 4.2 4.1 3.8 3.7 3.7  CL 111 110 108 102 107  CO2 23 23 25 23 23   GLUCOSE 177* 253* 226* 271* 183*  BUN 16 20 19 20 23   CREATININE 0.92 0.93 0.84 1.00 0.95  CALCIUM 8.2* 8.4* 8.7* 8.8* 9.0  MG 1.9 2.2 2.1 1.9 2.2  PHOS 3.5 3.3 3.3 3.5  3.2   GFR: Estimated Creatinine Clearance: 69.4 mL/min (by C-G formula based on SCr of 0.95 mg/dL). Liver Function Tests: Recent Labs  Lab 03/17/20 0540 03/18/20 0438 03/19/20 0543 03/20/20 0456 03/21/20 0455  AST 41 31 31 45* 33  ALT 17 18 19 28 24   ALKPHOS 50 54 59 67 64  BILITOT 0.7 0.7 0.7 1.1 1.2  PROT 6.5 6.5 6.9 7.9 7.5  ALBUMIN 2.6* 2.5* 2.7* 3.3* 3.1*   No results for input(s): LIPASE, AMYLASE in the last 168 hours. No results for input(s): AMMONIA in the last 168 hours. Coagulation Profile: Recent Labs  Lab 03/16/20 0947  INR 1.0   Cardiac Enzymes: No results for input(s): CKTOTAL, CKMB, CKMBINDEX, TROPONINI in the last 168 hours. BNP (last 3 results) No results for input(s): PROBNP in the last 8760 hours. HbA1C:  No results for input(s): HGBA1C in the last 72 hours. CBG: Recent Labs  Lab 03/19/20 2100 03/20/20 0829 03/20/20 1115 03/20/20 1617 03/20/20 1956  GLUCAP 187* 278* 243* 204* 161*   Lipid Profile: No results for input(s): CHOL, HDL, LDLCALC, TRIG, CHOLHDL, LDLDIRECT in the last 72 hours. Thyroid Function Tests: No results for input(s): TSH, T4TOTAL, FREET4, T3FREE, THYROIDAB in the last 72 hours. Anemia Panel: No results for input(s): VITAMINB12, FOLATE, FERRITIN, TIBC, IRON, RETICCTPCT in the last 72 hours. Urine analysis:    Component Value Date/Time   COLORURINE YELLOW 09/30/2019 1005   APPEARANCEUR CLEAR 09/30/2019 1005   APPEARANCEUR Clear 06/11/2011 2046   LABSPEC 1.014 09/30/2019 1005   LABSPEC 1.025 06/11/2011 2046   PHURINE 7.0 09/30/2019 1005   GLUCOSEU NEGATIVE 09/30/2019 1005   GLUCOSEU Negative 06/11/2011 2046   HGBUR NEGATIVE 09/30/2019 1005   BILIRUBINUR NEGATIVE 09/30/2019 1005   BILIRUBINUR Negative 06/11/2011 2046   KETONESUR NEGATIVE 09/30/2019 1005   PROTEINUR 30 (A) 09/30/2019 1005   NITRITE NEGATIVE 09/30/2019 1005   LEUKOCYTESUR NEGATIVE 09/30/2019 1005   LEUKOCYTESUR Negative 06/11/2011 2046   Sepsis  Labs: Invalid input(s): PROCALCITONIN, LACTICIDVEN  Recent Results (from the past 240 hour(s))  Blood Culture (routine x 2)     Status: None   Collection Time: 03/16/20  9:47 AM   Specimen: BLOOD LEFT ARM  Result Value Ref Range Status   Specimen Description BLOOD LEFT ARM  Final   Special Requests   Final    BOTTLES DRAWN AEROBIC AND ANAEROBIC Blood Culture adequate volume   Culture   Final    NO GROWTH 5 DAYS Performed at [facility], [address], [place], [place] [address]    Report Status 03/21/2020 FINAL  Final  Blood Culture (routine x 2)     Status: None   Collection Time: 03/16/20  9:47 AM   Specimen: BLOOD  Result Value Ref Range Status   Specimen Description BLOOD LEFT ANTECUBITAL  Final   Special Requests   Final    BOTTLES DRAWN AEROBIC AND ANAEROBIC Blood Culture adequate volume   Culture   Final    NO GROWTH 5 DAYS Performed at [facility], [address] [place], [place] [address]    Report Status 03/21/2020 FINAL  Final  SARS CORONAVIRUS 2 (TAT 6-24 HRS)     Status: Abnormal   Collection Time: 03/17/20 10:00 AM  Result Value Ref Range Status   SARS Coronavirus 2 POSITIVE (A) NEGATIVE Final    Comment: (NOTE) SARS-CoV-2 target nucleic acids are DETECTED.  The SARS-CoV-2 RNA is generally detectable in upper and lower respiratory specimens during the acute phase of infection. Positive results are indicative of the presence of SARS-CoV-2 RNA. Clinical correlation with patient history and other diagnostic information is  necessary to determine patient infection status. Positive results do not rule out bacterial infection or co-infection with other viruses.  The expected result is Negative.  Fact Sheet for Patients: SugarRoll.be  Fact Sheet for Healthcare Providers: https://www.woods-mathews.com/  This test is not yet approved or cleared by the [place] FDA and  has been authorized for  detection and/or diagnosis of SARS-CoV-2 by FDA under an Emergency Use Authorization (EUA). This EUA will remain  in effect (meaning this test can be used) for the duration of the COVID-19 declaration under Section 564(b)(1) of the Act, 21 U. S.C. section 360bbb-3(b)(1), unless the authorization is terminated or revoked sooner.   Performed at [facility], [place]  [address], [place], [place] [address]       Radiology Studies: No results found.  Marzetta Board, MD, PhD [facility]  Between 7 am - 7 pm I am available, please contact me via Amion or Securechat  Between 7 pm - 7 am I am not available, please contact night coverage MD/APP via Amion

## 2020-03-21 NOTE — Progress Notes (Signed)
Inpatient Diabetes Program Recommendations  AACE/ADA: New Consensus Statement on Inpatient Glycemic Control   Target Ranges:  Prepandial:   less than 140 mg/dL      Peak postprandial:   less than 180 mg/dL (1-2 hours)      Critically ill patients:  140 - 180 mg/dL   Results for Malik [name], Malik [name] (MRN [id number]) as of 03/21/2020 13:33  Ref. Range 03/20/2020 08:29 03/20/2020 11:15 03/20/2020 16:17 03/20/2020 19:56 03/21/2020 12:14  Glucose-Capillary Latest Ref Range: 70 - 99 mg/dL 278 (H) 243 (H) 204 (H) 161 (H) 369 (H)   Review of Glycemic Control  Current orders for Inpatient glycemic control: Novolog 0-15 units TID with meals; Solumedrol 45 mg Q12H  Inpatient Diabetes Program Recommendations:    Diet: If appropriate, consider adding Carb Mod to Dys 3 diet.  Insulin: If steroids are continued, please consider ordering Novolog 4 units TID with meals for meal coverage if patient eats at least 50% of meals.  Thanks, Barnie Alderman, RN, MSN, CDE Diabetes Coordinator Inpatient Diabetes Program [phone] (Team Pager from 8am to 5pm)

## 2020-03-22 DIAGNOSIS — I5032 Chronic diastolic (congestive) heart failure: Secondary | ICD-10-CM | POA: Diagnosis not present

## 2020-03-22 DIAGNOSIS — I10 Essential (primary) hypertension: Secondary | ICD-10-CM | POA: Diagnosis not present

## 2020-03-22 DIAGNOSIS — J1282 Pneumonia due to coronavirus disease 2019: Secondary | ICD-10-CM | POA: Diagnosis not present

## 2020-03-22 DIAGNOSIS — U071 COVID-19: Secondary | ICD-10-CM | POA: Diagnosis not present

## 2020-03-22 LAB — COMPREHENSIVE METABOLIC PANEL
ALT: 25 U/L (ref 0–44)
AST: 28 U/L (ref 15–41)
Albumin: 2.9 g/dL — ABNORMAL LOW (ref 3.5–5.0)
Alkaline Phosphatase: 65 U/L (ref 38–126)
Anion gap: 10 (ref 5–15)
BUN: 31 mg/dL — ABNORMAL HIGH (ref 8–23)
CO2: 25 mmol/L (ref 22–32)
Calcium: 8.7 mg/dL — ABNORMAL LOW (ref 8.9–10.3)
Chloride: 110 mmol/L (ref 98–111)
Creatinine, Ser: 1.01 mg/dL (ref 0.61–1.24)
GFR, Estimated: >60 mL/min (ref >60)
Glucose, Bld: 189 mg/dL — ABNORMAL HIGH (ref 70–99)
Potassium: 3.7 mmol/L (ref 3.5–5.1)
Sodium: 145 mmol/L (ref 135–145)
Total Bilirubin: 1.1 mg/dL (ref 0.3–1.2)
Total Protein: 6.7 g/dL (ref 6.5–8.1)

## 2020-03-22 LAB — CBC
HCT: 40.5 % (ref 39.0–52.0)
Hemoglobin: 13.3 g/dL (ref 13.0–17.0)
MCH: 25.6 pg — ABNORMAL LOW (ref 26.0–34.0)
MCHC: 32.8 g/dL (ref 30.0–36.0)
MCV: 78 fL — ABNORMAL LOW (ref 80.0–100.0)
Platelets: 522 10*3/uL — ABNORMAL HIGH (ref 150–400)
RBC: 5.19 MIL/uL (ref 4.22–5.81)
RDW: 21.1 % — ABNORMAL HIGH (ref 11.5–15.5)
WBC: 12.8 10*3/uL — ABNORMAL HIGH (ref 4.0–10.5)
nRBC: 0.4 % — ABNORMAL HIGH (ref 0.0–0.2)

## 2020-03-22 LAB — GLUCOSE, CAPILLARY
Glucose-Capillary: 172 mg/dL — ABNORMAL HIGH (ref 70–99)
Glucose-Capillary: 330 mg/dL — ABNORMAL HIGH (ref 70–99)
Glucose-Capillary: 462 mg/dL — ABNORMAL HIGH (ref 70–99)
Glucose-Capillary: 235 mg/dL — ABNORMAL HIGH (ref 70–99)

## 2020-03-22 LAB — C-REACTIVE PROTEIN: CRP: 1.1 mg/dL — ABNORMAL HIGH (ref <1.0)

## 2020-03-22 LAB — FIBRIN DERIVATIVES D-DIMER (ARMC ONLY): Fibrin derivatives D-dimer (ARMC): 1590.2 ng/mL (FEU) — ABNORMAL HIGH (ref 0.00–499.00)

## 2020-03-22 MED ORDER — BLISTEX MEDICATED EX OINT
TOPICAL_OINTMENT | CUTANEOUS | Status: DC | PRN
  Filled 2020-03-22: qty 6.3

## 2020-03-22 MED ORDER — INSULIN ASPART 100 UNIT/ML ~~LOC~~ SOLN
4.0000 [IU] | Freq: Three times a day (TID) | SUBCUTANEOUS | Status: DC
  Administered 2020-03-22 – 2020-03-23 (×2): 4 [IU] via SUBCUTANEOUS
  Filled 2020-03-22 (×2): qty 1

## 2020-03-22 NOTE — Plan of Care (Signed)

## 2020-03-22 NOTE — Progress Notes (Signed)
CBG 462. Messaged DO Arbutus Ped via secure chat. MD notified this RN to administer 20 units of Novolog.

## 2020-03-22 NOTE — Hospital Course (Signed)
75 y.o. 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 He presented to our ED on 12/29 with fever/chills and fatigue and he tested positive for COVID-19.  Family and patient left prior to being seen by ED provider back then. Family reports more lethargy, progressive dyspnea, found to be hypoxic and was sent to ER on 03/16/20.

## 2020-03-22 NOTE — Evaluation (Signed)
Occupational Therapy Evaluation Patient Details Name: Malik [name] MRN: [id number] DOB: Jan 07, 1946 Today's Date: 03/22/2020    History of Present Illness [name] 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 He presented to our ED on 12/29 with fever/chills and fatigue and he tested positive for COVID-19.  He left AMA prior to being seen by ED provider. Family reports more lethargy, found to be hypoxic and was sent back to the ER.  MD assessment includes: Acute Hypoxic Respiratory Failure due to Covid-19 viral illness, severe sepsis, elevated troponin likely demand ischemia, and recent GI bleed.   Clinical Impression   [name] was seen for OT evaluation this date. Prior to hospital admission, pt was SBA + QC for mobility and required assist for ADLs. Pt lives c PCA available 24/7 in home c 2 STE. Pt presents to acute OT demonstrating impaired ADL performance and functional mobility 2/2 decreased safety awareness, functional strength/ROM/balance deficits, and poor activity tolerance. Pt currently requires MAX A for LBD seated on BSC. MIN A + QC for BSC t/f and MAX A perihygiene in standing. Pt would benefit from skilled OT to address noted impairments and functional limitations (see below for any additional details) in order to maximize safety and independence while minimizing falls risk and caregiver burden. Upon hospital discharge, recommend STR to maximize pt safety and return to PLOF.  Sitting: SpO2 90% on 3L HFNC, resolved c rest Bed>BSC t/f: SpO2 92%, resolved to 96% c seated rest  BSC>bed t/f: SpO2 desat 82% on 3L [place], resolved c seated rest to mid 90s    Follow Up Recommendations  SNF    Equipment Recommendations  Other (comment) (TBD)    Recommendations for Other Services        Precautions / Restrictions Precautions Precautions: Fall Restrictions Weight Bearing Restrictions: No Other Position/Activity Restrictions: Keep O2 >94%      Mobility Bed Mobility Overal bed mobility: Needs Assistance Bed Mobility: Sit to Supine;Supine to Sit     Supine to sit: Mod assist Sit to supine: Mod assist   General bed mobility comments: Mod A for BLE and trunk control    Transfers Overall transfer level: Needs assistance Equipment used: Quad cane Transfers: Sit to/from [facility] Sit to Stand: Mod assist Stand pivot transfers: Mod assist            Balance Overall balance assessment: Needs assistance Sitting-balance support: Feet supported;Single extremity supported Sitting balance-Leahy Scale: Good     Standing balance support: Single extremity supported;During functional activity Standing balance-Leahy Scale: Fair                             ADL either performed or assessed with clinical judgement   ADL Overall ADL's : Needs assistance/impaired                                       General ADL Comments: MAX A for LBD seated on BSC. MIN A + QC for BSC t/f and MAX A perihygiene in standing.                  Pertinent Vitals/Pain Pain Assessment: Faces Faces Pain Scale: No hurt  Hand Dominance Right   Extremity/Trunk Assessment Upper Extremity Assessment Upper Extremity Assessment: RUE deficits/detail;LUE deficits/detail RUE Deficits / Details: History of R-sided hemiparesis LUE Deficits / Details: grossly 4/5   Lower Extremity Assessment Lower Extremity Assessment: Defer to PT evaluation       Communication Communication Communication: Expressive difficulties;Receptive difficulties   Cognition Arousal/Alertness: Awake/alert Behavior During Therapy: WFL for tasks assessed/performed Overall Cognitive Status: Difficult to assess                                      General Comments  Sitting: SpO2 90% on 3L HFNC, resolved c rest. Following BSC t/f SpO2 92%, resolved to 96% c seated rest    Exercises Exercises: Other exercises Other Exercises Other Exercises: Pt educated re: OT role, DME recs, d/c recs, falls prevention Other Exercises: LBD, toileting, sup<>sit, sit<>stand, SPT, sitting/standing balance/tolerance   Shoulder Instructions      Home Living Family/patient expects to be discharged to:: Private residence Living Arrangements: Alone Available Help at Discharge: Personal care attendant;Available 24 hours/day Type of Home: House Home Access: Stairs to enter [employer] of Steps: 2 Entrance Stairs-Rails: Left Home Layout: One level               Home Equipment: Cane - quad   Additional Comments: Pt has a PCA 24/7; history provided via phone call with son secondary to pt non-verbal with expressive aphasia      Prior Functioning/Environment Level of Independence: Needs assistance  Gait / Transfers Assistance Needed: SBA with ambulation limited community distances with a QC, SBA/min A with bed mobiltiy tasks, SBA with transfers, no falls in the last 6 months ADL's / Homemaking Assistance Needed: PCA assists with all ADLs Communication / Swallowing Assistance Needed: Expressive aphasia from previous CVA          OT Problem List: Decreased strength;Decreased activity tolerance;Impaired balance (sitting and/or standing);Decreased safety awareness      OT Treatment/Interventions: Self-care/ADL training;Therapeutic exercise;DME and/or AE instruction;Energy conservation;Therapeutic activities;Patient/family education;Balance training    OT Goals(Current goals can be found in the care plan section) Acute Rehab OT Goals Patient Stated Goal: To use bathroom OT Goal Formulation: With patient Time For Goal Achievement: 04/05/20 Potential to Achieve Goals: Good ADL Goals Pt Will Perform Eating: with set-up;with  supervision;bed level Pt Will Perform Upper Body Dressing: with min guard assist;sitting Pt Will Transfer to Toilet: with supervision;stand pivot transfer;bedside commode (c LRAD PRN)  OT Frequency: Min 1X/week    AM-PAC OT "6 Clicks" Daily Activity     Outcome Measure Help from another person eating meals?: A Little Help from another person taking care of personal grooming?: A Little Help from another person toileting, which includes using toliet, bedpan, or urinal?: A Lot Help from another person bathing (including washing, rinsing, drying)?: A Lot Help from another person to put on and taking off regular upper body clothing?: A Little Help from another person to put on and taking off regular lower body clothing?: A Lot 6 Click Score: 15   End of Session Equipment Utilized During Treatment: Other (comment) (QC) Nurse Communication: Mobility status  Activity Tolerance: Patient tolerated treatment well Patient left: in bed;with call bell/phone within reach;with bed alarm set  OT Visit Diagnosis: Other abnormalities of gait and mobility (R26.89);Muscle weakness (generalized) (M62.81)                Time: 6644-0347 OT Time Calculation (  min): 44 min Charges:  OT General Charges $OT Visit: 1 Visit OT Evaluation $OT Eval Moderate Complexity: 1 Mod OT Treatments $Self Care/Home Management : 23-37 mins $Therapeutic Activity: 8-22 mins  Dessie Coma, M.S. OTR/L  03/22/20, 3:56 PM  ascom [phone]

## 2020-03-22 NOTE — Progress Notes (Signed)
PROGRESS NOTE    Malik [name]   [id number]  DOB: 01-31-1946  PCP: [facility] [place], NP    DOA: 03/16/2020 LOS: 6   Brief Narrative   75 y.o. 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 He presented to our ED on 12/29 with fever/chills and fatigue and he tested positive for COVID-19.  Family and patient left prior to being seen by ED provider back then. Family reports more lethargy, progressive dyspnea, found to be hypoxic and was sent to ER on 03/16/20.     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  Sepsis physiology has improved.  Lactic acidosis resolved.  Chest x-ray on admission showed multifocal pneumonia, consistent with COVID-19.  Of note, his chest x-ray in the ED on 12/29 also showed pneumonia, however family took him home at that time. Oxygen requirement improving, down from 10 L/min yesterday to 3 L/min this morning. Completed 5 days remdesivir --Continue steroids, baricitinib -- Continue supplemental oxygen to maintain O2 sat above 88%, wean off as tolerated  Elevated troponin -likely demand ischemia in the setting of hypoxia.  No chest pain or acute ischemic changes on EKG.  Was initially on heparin drip, since discontinued.  Chronic diastolic CHF -appears clinically euvolemic.  Monitor volume status closely and consider  Lasix.  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  Appears neurology recommendation in November was to continue Plavix, family's stated he is not taking this at home. --Outpatient neurology follow-up  Essential hypertension -chronic, stable.  Continue metoprolol, amlodipine  Type 2 diabetes -sliding scale NovoLog, titrate insulin if hyperglycemia with steroids  History of alcohol abuse -no recent heavy drinking per family  History of recent GI bleed -hospitalized for this following his stroke when he was on DAPT.  Currently not on antiplatelet therapy per family.   DVT prophylaxis: enoxaparin (LOVENOX) injection 40 mg Start: 03/17/20 1030   Diet:  Diet Orders (From admission, onward)    Start     Ordered   03/21/20 1026  DIET DYS 3 Room service appropriate? Yes with Assist; Fluid consistency: Thin  Diet effective now       Comments: MINCED meats, w/ extra Gravy; potatoes too. Cream Soups. ENSURE TID meals.  Question Answer Comment  Room service appropriate? Yes with Assist   Fluid consistency: Thin      03/21/20 1026            Code Status: Full Code    Subjective 03/22/20    Patient seen at bedside this morning, appears confused.  Voice is extremely quiet and difficult to understand.  Per RN, mittens placed as patient pulling IVs etc.  Disposition Plan & Communication   Status is: Inpatient  Remains inpatient appropriate because:Inpatient level of care appropriate due to severity of illness, weaning off oxygen.   Dispo: The patient is from: Home              Anticipated d/c is to: SNF              Anticipated d/c date is: 2 days              Patient currently is not medically stable to d/c.        Family Communication: None at bedside,  will attempt to call   Consults, Procedures, Significant Events   Consultants:   None  Procedures:   None  Antimicrobials:  Anti-infectives (From admission, onward)   Start     Dose/Rate Route Frequency Ordered Stop   03/17/20 1000  remdesivir 100 mg in sodium chloride 0.9 % 100 mL IVPB  Status:  Discontinued       "Followed by" Linked Group Details   100 mg 200 mL/hr over 30 Minutes Intravenous Daily 03/16/20 1405 03/16/20 1405   03/17/20 1000  remdesivir 100 mg in sodium chloride 0.9 % 100 mL IVPB       "Followed by" Linked Group Details   100 mg 200 mL/hr over 30 Minutes Intravenous Daily 03/16/20 1403 03/20/20 0917   03/16/20 1500  remdesivir 200 mg in sodium chloride 0.9% 250 mL IVPB       "Followed by" Linked Group Details   200 mg 580 mL/hr over 30 Minutes Intravenous Once 03/16/20 1403 03/16/20 1727   03/16/20 1345  remdesivir 200 mg in sodium chloride 0.9% 250 mL IVPB  Status:  Discontinued       "Followed by" Linked Group Details   200 mg 580 mL/hr over 30 Minutes Intravenous Once 03/16/20 1405 03/16/20 1405   03/16/20 1300  cefTRIAXone (ROCEPHIN) 2 g in sodium chloride 0.9 % 100 mL IVPB        2 g 200 mL/hr over 30 Minutes Intravenous  Once 03/16/20 1250 03/16/20 1403   03/16/20 1300  azithromycin (ZITHROMAX) 500 mg in sodium chloride 0.9 % 250 mL IVPB        500 mg 250 mL/hr over 60 Minutes Intravenous  Once 03/16/20 1250 03/16/20 1554        Objective   Vitals:   03/22/20 0340 03/22/20 0535 03/22/20 0802 03/22/20 1201  BP: (!) 179/87 (!) 150/81 (!) 155/79 (!) 150/71  Pulse: 74  78 75  Resp: 19  18 17   Temp: 98.6 F (37 C)  98 F (36.7 C) 98.1 F (36.7 C)  TempSrc:   Axillary Axillary  SpO2: 100%  97% 96%  Weight: 83.5 kg     Height:        Intake/Output Summary (Last 24 hours) at 03/22/2020 1551 Last data filed at 03/22/2020 1202 Gross per 24 hour  Intake 480 ml  Output 1375 ml  Net -895 ml   Filed Weights   03/19/20 0425 03/21/20 0435  03/22/20 0340  Weight: 87.5 kg 87.1 kg 83.5 kg    Physical Exam:  General exam: awake, alert, no acute distress Respiratory system: CTAB but diminished, no wheezes, normal respiratory effort, on 3 L/min oxygen. Cardiovascular system: normal S1/S2, RRR, no pedal edema.   Gastrointestinal system: soft, NT, ND, hypoactive bowel sounds. Central nervous system: Unable to evaluate due to patient not following commands Extremities: no edema, mittens on bilateral hands  Skin: dry, intact,  normal temperature Psychiatry: normal mood, congruent affect, abnormal judgment and insight   Labs   Data Reviewed: I have personally reviewed following labs and imaging studies  CBC: Recent Labs  Lab 03/17/20 0540 03/18/20 0438 03/19/20 0543 03/20/20 0456 03/21/20 0455 03/22/20 0550  WBC 7.7 8.6 9.9 12.3* 11.7* 12.8*  NEUTROABS 6.1 6.9 8.5* 10.9* 9.0*  --   HGB 11.0* 11.3* 12.1* 13.4 13.8 13.3  HCT 33.4* 33.5* 37.1* 40.8 41.1 40.5  MCV 79.7* 78.5* 79.3* 78.9* 78.1* 78.0*  PLT 290 347 409* 442* 515* 315*   Basic Metabolic Panel: Recent Labs  Lab 03/17/20 0540 03/18/20 0438 03/19/20 0543 03/20/20 0456 03/21/20 0455 03/22/20 0550  NA 142 141 141 141 144 145  K 4.2 4.1 3.8 3.7 3.7 3.7  CL 111 110 108 102 107 110  CO2 23 23 25 23 23 25   GLUCOSE 177* 253* 226* 271* 183* 189*  BUN 16 20 19 20 23  31*  CREATININE 0.92 0.93 0.84 1.00 0.95 1.01  CALCIUM 8.2* 8.4* 8.7* 8.8* 9.0 8.7*  MG 1.9 2.2 2.1 1.9 2.2  --   PHOS 3.5 3.3 3.3 3.5 3.2  --    GFR: Estimated Creatinine Clearance: 65.3 mL/min (by C-G formula based on SCr of 1.01 mg/dL). Liver Function Tests: Recent Labs  Lab 03/18/20 0438 03/19/20 0543 03/20/20 0456 03/21/20 0455 03/22/20 0550  AST 31 31 45* 33 28  ALT 18 19 28 24 25   ALKPHOS 54 59 67 64 65  BILITOT 0.7 0.7 1.1 1.2 1.1  PROT 6.5 6.9 7.9 7.5 6.7  ALBUMIN 2.5* 2.7* 3.3* 3.1* 2.9*   No results for input(s): LIPASE, AMYLASE in the last 168 hours. No results for  input(s): AMMONIA in the last 168 hours. Coagulation Profile: Recent Labs  Lab 03/16/20 0947  INR 1.0   Cardiac Enzymes: No results for input(s): CKTOTAL, CKMB, CKMBINDEX, TROPONINI in the last 168 hours. BNP (last 3 results) No results for input(s): PROBNP in the last 8760 hours. HbA1C: No results for input(s): HGBA1C in the last 72 hours. CBG: Recent Labs  Lab 03/21/20 1620 03/21/20 1933 03/21/20 2334 03/22/20 0802 03/22/20 1200  GLUCAP 263* 74 178* 172* 330*   Lipid Profile: No results for input(s): CHOL, HDL, LDLCALC, TRIG, CHOLHDL, LDLDIRECT in the last 72 hours. Thyroid Function Tests: No results for input(s): TSH, T4TOTAL, FREET4, T3FREE, THYROIDAB in the last 72 hours. Anemia Panel: No results for input(s): VITAMINB12, FOLATE, FERRITIN, TIBC, IRON, RETICCTPCT in the last 72 hours. Sepsis Labs: Recent Labs  Lab 03/16/20 0947 03/16/20 1105 03/16/20 1308 03/16/20 1414 03/17/20 0540 03/17/20 0541 03/18/20 0438  PROCALCITON  --   --  0.15  --  0.17  --  0.14  LATICACIDVEN 3.9* 5.4*  --  1.7  --  1.4  --     Recent Results (from the past 240 hour(s))  Blood Culture (routine x 2)     Status: None   Collection Time: 03/16/20  9:47 AM   Specimen: BLOOD LEFT ARM  Result Value Ref Range Status   Specimen Description BLOOD LEFT ARM  Final   Special Requests   Final    BOTTLES DRAWN AEROBIC AND ANAEROBIC Blood Culture adequate volume   Culture   Final    NO GROWTH 5 DAYS Performed at [facility], [address] [place], [place] [address]    Report Status 03/21/2020 FINAL  Final  Blood Culture (routine x 2)     Status: None   Collection Time:  03/16/20  9:47 AM   Specimen: BLOOD  Result Value Ref Range Status   Specimen Description BLOOD LEFT ANTECUBITAL  Final   Special Requests   Final    BOTTLES DRAWN AEROBIC AND ANAEROBIC Blood Culture adequate volume   Culture   Final    NO GROWTH 5 DAYS Performed at [facility], [address] [place], [place] [address]    Report Status 03/21/2020 FINAL  Final  SARS CORONAVIRUS 2 (TAT 6-24 HRS)     Status: Abnormal   Collection Time: 03/17/20 10:00 AM  Result Value Ref Range Status   SARS Coronavirus 2 POSITIVE (A) NEGATIVE Final    Comment: (NOTE) SARS-CoV-2 target nucleic acids are DETECTED.  The SARS-CoV-2 RNA is generally detectable in upper and lower respiratory specimens during the acute phase of infection. Positive results are indicative of the presence of SARS-CoV-2 RNA. Clinical correlation with patient history and other diagnostic information is  necessary to determine patient infection status. Positive results do not rule out bacterial infection or co-infection with other viruses.  The expected result is Negative.  Fact Sheet for Patients: SugarRoll.be  Fact Sheet for Healthcare Providers: https://www.woods-mathews.com/  This test is not yet approved or cleared by the [place] FDA and  has been authorized for detection and/or diagnosis of SARS-CoV-2 by FDA under an Emergency Use Authorization (EUA). This EUA will remain  in effect (meaning this test can be used) for the duration of the COVID-19 declaration under Section 564(b)(1) of the Act, 21 U. S.C. section 360bbb-3(b)(1), unless the authorization is terminated or revoked sooner.   Performed at [facility], [place] [address], [place],  [address]       Imaging Studies   No results found.   Medications   Scheduled Meds: . amLODipine  10 mg Oral Daily  . atorvastatin  80 mg Oral Daily  . baricitinib  4 mg Oral Daily  . enoxaparin (LOVENOX) injection  40 mg Subcutaneous Q24H  . folic acid  1 mg Oral Daily  . insulin aspart  0-15 Units Subcutaneous TID WC  . methylPREDNISolone (SOLU-MEDROL) injection  45 mg Intravenous Q12H  . metoprolol tartrate  25 mg Oral BID  . pantoprazole  40 mg Oral Daily  . thiamine  100 mg Oral Daily    Continuous Infusions:     LOS: 6 days    Time spent: 30 minutes    Ezekiel Slocumb, DO [facility]  03/22/2020, 3:51 PM    If 7PM-7AM, please contact night-coverage. How to contact the [facility] Attending or Consulting provider [place] or covering provider during after hours [place], for this patient?    1. Check the care team in [facility] and look for a) attending/consulting [facility] provider listed and b) the [facility] team listed 2. Log into [email or web address] and use [place]'s universal password to access. If you do not have the password, please contact the hospital operator. 3. Locate the [facility] provider you are looking for under [facility] and page to a number that you can be directly reached. 4. If you still have difficulty reaching the provider, please page the [facility] (Director on Call) for the Hospitalists listed on amion for assistance.

## 2020-03-22 NOTE — Progress Notes (Signed)
Physical Therapy Treatment Patient Details Name: Malik [name] MRN: [id number] DOB: 16-Dec-1945 Today's Date: 03/22/2020    History of Present Illness 75 y.o. 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 He presented to our ED on 12/29 with fever/chills and fatigue and he tested positive for COVID-19.  He left AMA prior to being seen by ED provider. Family reports more lethargy, found to be hypoxic and was sent back to the ER.  MD assessment includes: Acute Hypoxic Respiratory Failure due to Covid-19 viral illness, severe sepsis, elevated troponin likely demand ischemia, and recent GI bleed.    PT Comments    Pt showed good effort during PT session despite inability to really communicate effectively or answer questions.  We had to defer standing/ambulation secondary to high blood sugar reading, but he did show good balance in sitting and effort with bed mobility.  Pt needed some extra cuing with exercises and mobility but with simple verbal and some tactile cues performed well.     Follow Up Recommendations  SNF     Equipment Recommendations  None recommended by PT    Recommendations for Other Services       Precautions / Restrictions Precautions Precautions: Fall Restrictions Weight Bearing Restrictions: No Other Position/Activity Restrictions: Keep O2 >94%    Mobility  Bed Mobility Overal bed mobility: Needs Assistance Bed Mobility: Sit to Supine;Supine to Sit     Supine to sit: Mod assist;Min assist Sit to supine: Mod assist   General bed mobility comments: Pt showed good effort and per previuos session was able to coordinate appropriate sequencing better  Transfers Overall transfer level: Needs assistance Equipment used: Quad cane Transfers: Sit to/from [employer] Sit to Stand: Mod assist Stand pivot transfers: Min assist       General transfer comment: nusing came to check blood glucose while in sitting and he was 462, deferred further mobility  Ambulation/Gait                 Stairs             Wheelchair Mobility    Modified Rankin (Stroke Patients Only)       Balance Overall balance assessment: Needs assistance Sitting-balance support: Feet supported;Single extremity supported Sitting balance-Leahy Scale: Good     Standing balance support: Single extremity supported;During functional activity Standing balance-Leahy Scale: Fair                              Cognition Arousal/Alertness: Awake/alert Behavior During Therapy: WFL for tasks assessed/performed Overall Cognitive Status: Difficult to assess                                        Exercises General Exercises - Lower Extremity Ankle Circles/Pumps: AROM;10 reps Short Arc Quad: Strengthening;10 reps Heel Slides: Strengthening;10 reps (with resisted leg extensions) Hip ABduction/ADduction: Strengthening;10 reps Straight Leg Raises: AROM;5 reps Other Exercises Other Exercises: Pt educated re: OT role, DME recs, d/c recs, falls prevention Other Exercises: LBD, toileting, sup<>sit, sit<>stand, SPT, sitting/standing balance/tolerance    General Comments General comments (skin integrity, edema, etc.): Sitting: SpO2 90% on 3L HFNC, resolved c rest. Following BSC t/f SpO2 92%, resolved  to 96% c seated rest      Pertinent Vitals/Pain Pain Assessment:  (unable to answer, does not indicate pain but grunts yeah when asked) Faces Pain Scale: No hurt    Home Living Family/patient expects to be discharged to:: Private residence Living Arrangements: Alone Available Help at Discharge: Personal care attendant;Available 24 hours/day Type of Home: House Home Access: Stairs to enter Entrance Stairs-Rails: Left Home Layout: One  level Home Equipment: Cane - quad Additional Comments: Pt has a PCA 24/7; history provided via phone call with son secondary to pt non-verbal with expressive aphasia    Prior Function Level of Independence: Needs assistance  Gait / Transfers Assistance Needed: SBA with ambulation limited community distances with a QC, SBA/min A with bed mobiltiy tasks, SBA with transfers, no falls in the last 6 months ADL's / Homemaking Assistance Needed: PCA assists with all ADLs     PT Goals (current goals can now be found in the care plan section) Acute Rehab PT Goals Patient Stated Goal: To use bathroom Progress towards PT goals: Progressing toward goals    Frequency    Min 2X/week      PT Plan Current plan remains appropriate    Co-evaluation              AM-PAC PT "6 Clicks" Mobility   Outcome Measure  Help needed turning from your back to your side while in a flat bed without using bedrails?: A Little Help needed moving from lying on your back to sitting on the side of a flat bed without using bedrails?: A Little Help needed moving to and from a bed to a chair (including a wheelchair)?: A Lot Help needed standing up from a chair using your arms (e.g., wheelchair or bedside chair)?: A Lot Help needed to walk in hospital room?: A Lot Help needed climbing 3-5 steps with a railing? : Total 6 Click Score: 13    End of Session Equipment Utilized During Treatment: Oxygen Activity Tolerance: Patient tolerated treatment well Patient left: with bed alarm set;with call bell/phone within reach Nurse Communication: Mobility status PT Visit Diagnosis: Unsteadiness on feet (R26.81);Other abnormalities of gait and mobility (R26.89);Muscle weakness (generalized) (M62.81)     Time: 1191-4782 PT Time Calculation (min) (ACUTE ONLY): 25 min  Charges:  $Therapeutic Exercise: 8-22 mins $Therapeutic Activity: 8-22 mins                     Kreg Shropshire, DPT 03/22/2020, 5:54 PM

## 2020-03-22 NOTE — Progress Notes (Signed)
Updated daughter [name] over phone about father's condition. -oxygen requirement down to 3L from 9 Tuesday -Sitting up in bed, fed breakfast (French toast, applesauce and ensure) -daughter told me father walks with walker at home and feeds himself -Patient was tired after eating breakfast, I will assist him with lunch and determine how his energy levels sustain with increased activity.  -Will get order for OT

## 2020-03-23 DIAGNOSIS — I5032 Chronic diastolic (congestive) heart failure: Secondary | ICD-10-CM | POA: Diagnosis not present

## 2020-03-23 DIAGNOSIS — I10 Essential (primary) hypertension: Secondary | ICD-10-CM | POA: Diagnosis not present

## 2020-03-23 DIAGNOSIS — J1282 Pneumonia due to coronavirus disease 2019: Secondary | ICD-10-CM | POA: Diagnosis not present

## 2020-03-23 DIAGNOSIS — U071 COVID-19: Secondary | ICD-10-CM | POA: Diagnosis not present

## 2020-03-23 LAB — COMPREHENSIVE METABOLIC PANEL
ALT: 30 U/L (ref 0–44)
AST: 31 U/L (ref 15–41)
Albumin: 3 g/dL — ABNORMAL LOW (ref 3.5–5.0)
Alkaline Phosphatase: 71 U/L (ref 38–126)
Anion gap: 12 (ref 5–15)
BUN: 36 mg/dL — ABNORMAL HIGH (ref 8–23)
CO2: 24 mmol/L (ref 22–32)
Calcium: 9 mg/dL (ref 8.9–10.3)
Chloride: 109 mmol/L (ref 98–111)
Creatinine, Ser: 1.14 mg/dL (ref 0.61–1.24)
GFR, Estimated: >60 mL/min (ref >60)
Glucose, Bld: 268 mg/dL — ABNORMAL HIGH (ref 70–99)
Potassium: 4.4 mmol/L (ref 3.5–5.1)
Sodium: 145 mmol/L (ref 135–145)
Total Bilirubin: 1.1 mg/dL (ref 0.3–1.2)
Total Protein: 7 g/dL (ref 6.5–8.1)

## 2020-03-23 LAB — CBC
HCT: 42.8 % (ref 39.0–52.0)
Hemoglobin: 14.1 g/dL (ref 13.0–17.0)
MCH: 25.8 pg — ABNORMAL LOW (ref 26.0–34.0)
MCHC: 32.9 g/dL (ref 30.0–36.0)
MCV: 78.2 fL — ABNORMAL LOW (ref 80.0–100.0)
Platelets: 496 10*3/uL — ABNORMAL HIGH (ref 150–400)
RBC: 5.47 MIL/uL (ref 4.22–5.81)
RDW: 21.7 % — ABNORMAL HIGH (ref 11.5–15.5)
WBC: 10.9 10*3/uL — ABNORMAL HIGH (ref 4.0–10.5)
nRBC: 0.3 % — ABNORMAL HIGH (ref 0.0–0.2)

## 2020-03-23 LAB — GLUCOSE, CAPILLARY
Glucose-Capillary: 262 mg/dL — ABNORMAL HIGH (ref 70–99)
Glucose-Capillary: 555 mg/dL (ref 70–99)
Glucose-Capillary: 339 mg/dL — ABNORMAL HIGH (ref 70–99)
Glucose-Capillary: 50 mg/dL — ABNORMAL LOW (ref 70–99)
Glucose-Capillary: 120 mg/dL — ABNORMAL HIGH (ref 70–99)

## 2020-03-23 LAB — FIBRIN DERIVATIVES D-DIMER (ARMC ONLY): Fibrin derivatives D-dimer (ARMC): 1822.34 ng/mL (FEU) — ABNORMAL HIGH (ref 0.00–499.00)

## 2020-03-23 LAB — C-REACTIVE PROTEIN: CRP: 1.2 mg/dL — ABNORMAL HIGH (ref <1.0)

## 2020-03-23 MED ORDER — INSULIN ASPART 100 UNIT/ML ~~LOC~~ SOLN
20.0000 [IU] | Freq: Once | SUBCUTANEOUS | Status: AC
  Administered 2020-03-23: 20 [IU] via SUBCUTANEOUS

## 2020-03-23 MED ORDER — DEXTROSE 50 % IV SOLN
25.0000 g | INTRAVENOUS | Status: AC
  Administered 2020-03-23: 25 g via INTRAVENOUS
  Filled 2020-03-23: qty 50

## 2020-03-23 MED ORDER — LOSARTAN POTASSIUM 25 MG PO TABS
25.0000 mg | ORAL_TABLET | Freq: Every day | ORAL | Status: DC
  Administered 2020-03-23 – 2020-03-24 (×2): 25 mg via ORAL
  Filled 2020-03-23 (×2): qty 1

## 2020-03-23 MED ORDER — PREDNISONE 20 MG PO TABS
40.0000 mg | ORAL_TABLET | Freq: Every day | ORAL | Status: DC
  Administered 2020-03-24 – 2020-03-30 (×7): 40 mg via ORAL
  Filled 2020-03-23 (×7): qty 2

## 2020-03-23 MED ORDER — INSULIN GLARGINE 100 UNIT/ML ~~LOC~~ SOLN
10.0000 [IU] | Freq: Every day | SUBCUTANEOUS | Status: DC
  Administered 2020-03-23 – 2020-03-27 (×4): 10 [IU] via SUBCUTANEOUS
  Filled 2020-03-23 (×5): qty 0.1

## 2020-03-23 MED ORDER — INSULIN ASPART 100 UNIT/ML ~~LOC~~ SOLN
8.0000 [IU] | Freq: Three times a day (TID) | SUBCUTANEOUS | Status: DC
  Administered 2020-03-23 – 2020-03-29 (×15): 8 [IU] via SUBCUTANEOUS
  Filled 2020-03-23 (×14): qty 1

## 2020-03-23 NOTE — Progress Notes (Signed)
Physical Therapy Treatment Patient Details Name: Malik [name] MRN: [id number] DOB: 1945-11-15 Today's Date: 03/23/2020    History of Present Illness 75 y.o. 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 He presented to our ED on 12/29 with fever/chills and fatigue and he tested positive for COVID-19.  He left AMA prior to being seen by ED provider. Family reports more lethargy, found to be hypoxic and was sent back to the ER.  MD assessment includes: Acute Hypoxic Respiratory Failure due to Covid-19 viral illness, severe sepsis, elevated troponin likely demand ischemia, and recent GI bleed.    PT Comments    Patient sitting up in recliner chair on arrival to room. Patient agreeable to PT and able to follow single step commands with increased time. 3 standing bouts performed from chair with Mod A for lifting and lowering assistance. Standing tolerance ~ 45 seconds with each standing bout. Min A initially for standing balance, progressing to Min guard with increased standing time. Pre-gait activity performed with weight shifting in standing with difficulty with RLE advancement for progressing to ambulation. Recommend PT to maximize independence and address functional limitations remaining. SNF recommended at discharge.    Follow Up Recommendations  SNF     Equipment Recommendations  None recommended by PT    Recommendations for Other Services       Precautions / Restrictions Precautions Precautions: Fall Restrictions Weight Bearing Restrictions: No    Mobility  Bed Mobility               General bed mobility comments: not addressed as patient sitting up on arrival and post session  Transfers Overall transfer level: Needs assistance Equipment used: Quad cane Transfers: Sit to/from  Stand Sit to Stand: Mod assist         General transfer comment: sit to stand transfers performed from chair with verbal cues for right foot placement and hand placement for safety and increased independence with transfers. performed 3 sit to stand bouts from chair  Ambulation/Gait             General Gait Details: pre-gait activity performed in standing with weight shifting to right and left with cane for UE support. patient has difficulty with advancement of RLE for ambulation attempts   Stairs             Wheelchair Mobility    Modified Rankin (Stroke Patients Only)       Balance Overall balance assessment: Needs assistance Sitting-balance support: Feet supported Sitting balance-Leahy Scale: Good     Standing balance support: Single extremity supported (LUE supported with quad cane) Standing balance-Leahy Scale: Fair Standing balance comment: Min A initially progressing to min guard with increased standing time                            Cognition Arousal/Alertness: Awake/alert Behavior During Therapy: WFL for tasks assessed/performed Overall Cognitive Status: Difficult to assess                                        Exercises General Exercises - Lower Extremity Ankle Circles/Pumps: AROM;Strengthening;Both;10 reps;Seated Long Arc Quad: AAROM;Strengthening;Both;10 reps;Seated Hip ABduction/ADduction: AAROM;Strengthening;Both;10 reps;Seated Hip Flexion/Marching: AAROM;Strengthening;Both;10 reps;Seated Other Exercises  Other Exercises: tactile and verbal cues for exercise technique for strenghening    General Comments General comments (skin integrity, edema, etc.): Sp02 94% after standing activity.      Pertinent Vitals/Pain Pain Assessment: No/denies pain    Home Living                      Prior Function            PT Goals (current goals can now be found in the care plan section) Acute Rehab PT  Goals Patient Stated Goal: none stated PT Goal Formulation: Patient unable to participate in goal setting Time For Goal Achievement: 04/02/20 Potential to Achieve Goals: Fair Progress towards PT goals: Progressing toward goals    Frequency    Min 2X/week      PT Plan Current plan remains appropriate    Co-evaluation              AM-PAC PT "6 Clicks" Mobility   Outcome Measure  Help needed turning from your back to your side while in a flat bed without using bedrails?: A Little Help needed moving from lying on your back to sitting on the side of a flat bed without using bedrails?: A Little Help needed moving to and from a bed to a chair (including a wheelchair)?: A Lot Help needed standing up from a chair using your arms (e.g., wheelchair or bedside chair)?: A Lot Help needed to walk in hospital room?: A Lot Help needed climbing 3-5 steps with a railing? : Total 6 Click Score: 13    End of Session Equipment Utilized During Treatment: Oxygen Activity Tolerance: Patient tolerated treatment well Patient left: in chair;with call bell/phone within reach;with chair alarm set Nurse Communication: Mobility status PT Visit Diagnosis: Unsteadiness on feet (R26.81);Other abnormalities of gait and mobility (R26.89);Muscle weakness (generalized) (M62.81)     Time: 1100-1130 PT Time Calculation (min) (ACUTE ONLY): 30 min  Charges:  $Therapeutic Exercise: 8-22 mins $Therapeutic Activity: 8-22 mins                     Minna Merritts, PT, MPT    Percell Locus 03/23/2020, 1:06 PM

## 2020-03-23 NOTE — NC FL2 (Signed)
[place] LEVEL OF CARE SCREENING TOOL     IDENTIFICATION  Patient Name: Malik [name] Birthdate: 10-18-1945 Sex: male Admission Date (Current Location): 03/16/2020  [place] and [place] Number:  Engineering geologist and Address:  [facility] [address], [place], [place] [address]      Provider Number: [id number]  Attending Physician Name and Address:  Ezekiel Slocumb, DO  Relative Name and Phone Number:  Kaylum Shrum [phone]    Current Level of Care: Hospital Recommended Level of Care: [place] Prior Approval Number:    Date Approved/Denied:   PASRR Number: [id number]  Discharge Plan: SNF    Current Diagnoses: Patient Active Problem List   Diagnosis Date Noted  . Pneumonia due to COVID-19 virus 03/16/2020  . Severe sepsis ([place]) 03/16/2020  . Respiratory failure, acute ([place]) 03/16/2020  . Hemiparesis ([place]) 03/16/2020  . Aphasia 03/16/2020  . Goals of care, counseling/discussion   . Palliative care by specialist   . Acute anemia 09/28/2019  . History of CVA (cerebrovascular accident) 09/28/2019  . Hyperlipidemia LDL goal <70 08/24/2019  . Dysphagia due to recent cerebral infarction 08/24/2019  . Severe protein-calorie malnutrition ([place]) 08/24/2019  . Iron deficiency anemia 08/24/2019  . AKI (acute kidney injury) ([place]) 08/24/2019  . CKD (chronic kidney disease), stage IIIa 08/24/2019  . Alcohol abuse 08/24/2019  . Obesity 08/24/2019  . Multinodular thyroid 08/24/2019  . Pressure injury of skin 08/13/2019  . 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 Thrombocytosis   . Acute lower UTI   . Labile blood pressure   . Hydrothorax   . Sleep disturbance   . Pleural effusion on left   . Hypertensive crisis   . Hypertension   . Leukocytosis   . Benign essential HTN   . Diabetes mellitus type 2 in nonobese (HCC)   . Acute blood loss anemia   . Trauma 05/27/2017   . TBI (traumatic brain injury) ([place])   . Essential hypertension   . Chronic diastolic congestive heart failure ([place])   . 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 Rib fractures 05/10/2017  . 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    Orientation RESPIRATION BLADDER Height & Weight     Self  Normal Continent Weight: 83.6 kg Height:  5\' 10"  (177.8 cm)  BEHAVIORAL SYMPTOMS/MOOD NEUROLOGICAL BOWEL NUTRITION STATUS      Continent Diet  AMBULATORY STATUS COMMUNICATION OF NEEDS Skin   Extensive Assist Non-Verbally                         Personal Care Assistance Level of Assistance  Bathing,Feeding,Dressing Bathing Assistance: Limited assistance   Dressing Assistance: Limited assistance     Functional Limitations Info  Sight,Hearing,Speech Sight Info: Adequate Hearing Info: Adequate Speech Info: Impaired    SPECIAL CARE FACTORS FREQUENCY  PT (By licensed PT),OT (By licensed OT)     PT Frequency: 5X WEEK OT Frequency: 5X WEEK            Contractures Contractures Info: Not present    Additional Factors Info  Code Status,Allergies Code Status Info: FULL Allergies Info: None           Current Medications (03/23/2020):  This is the current hospital active medication list Current Facility-Administered Medications  Medication Dose Route Frequency Provider Last Rate Last Admin  . acetaminophen (TYLENOL) tablet 650 mg  650 mg Oral Q6H PRN Wouk,  Ailene Rud, MD   650 mg at 03/19/20 2115  . amLODipine (NORVASC) tablet 10 mg  10 mg Oral Daily Caren Griffins, MD   10 mg at 03/23/20 0839  . atorvastatin (LIPITOR) tablet 80 mg  80 mg Oral Daily Gwynne Edinger, MD   80 mg at 03/23/20 0840  . baricitinib (OLUMIANT) tablet 4 mg  4 mg Oral Daily Wouk, Ailene Rud, MD   4 mg at 03/23/20 0840  . enoxaparin (LOVENOX) injection 40 mg  40 mg Subcutaneous Q24H Caren Griffins, MD   40 mg at 03/22/20 1006  . folic acid (FOLVITE) tablet 1 mg  1 mg Oral Daily Wouk,  Ailene Rud, MD   1 mg at 03/23/20 0839  . hydrALAZINE (APRESOLINE) injection 10 mg  10 mg Intravenous Q6H PRN Caren Griffins, MD   10 mg at 03/23/20 0612  . insulin aspart (novoLOG) injection 0-15 Units  0-15 Units Subcutaneous TID WC Wouk, Ailene Rud, MD   8 Units at 03/23/20 [phone]  . insulin aspart (novoLOG) injection 8 Units  8 Units Subcutaneous TID WC Nicole Kindred A, DO      . insulin glargine (LANTUS) injection 10 Units  10 Units Subcutaneous Daily Nicole Kindred A, DO      . lip balm (BLISTEX) ointment   Topical PRN Nicole Kindred A, DO      . losartan (COZAAR) tablet 25 mg  25 mg Oral Daily Nicole Kindred A, DO      . methylPREDNISolone sodium succinate (SOLU-MEDROL) 125 mg/2 mL injection 45 mg  45 mg Intravenous Q12H Caren Griffins, MD   45 mg at 03/23/20 0610  . metoprolol tartrate (LOPRESSOR) tablet 25 mg  25 mg Oral BID Gwynne Edinger, MD   25 mg at 03/23/20 0840  . pantoprazole (PROTONIX) EC tablet 40 mg  40 mg Oral Daily Gwynne Edinger, MD   40 mg at 03/23/20 0840  . thiamine tablet 100 mg  100 mg Oral Daily Gwynne Edinger, MD   100 mg at 03/23/20 0840     Discharge Medications: Please see discharge summary for a list of discharge medications.  Relevant Imaging Results:  Relevant Lab Results:   Additional Information Pos Covid 12/29  Kerin Salen, RN

## 2020-03-23 NOTE — TOC Progression Note (Signed)
Transition of Care [facility]) - Progression Note    Patient Details  Name: Malik [name] MRN: [id number] Date of Birth: [birth year]  Transition [facility] CM/SW [place], RN Phone Number: 03/23/2020, 9:36 AM  Clinical Narrative:  Called spoke with patients son, Malik [name]. About SNF and choice for patients. Son stated that he was agreeing with SNF placement father was in one before, recommends [place] area. FL2 completed, TOC will keep son posted.          Expected Discharge Plan and Services                                                 Social Determinants of Health (SDOH) Interventions    Readmission Risk Interventions No flowsheet data found.

## 2020-03-23 NOTE — Progress Notes (Addendum)
PROGRESS NOTE    Malik [name]   [id number]  DOB: 07-05-1945  PCP: [facility], [place], NP    DOA: 03/16/2020 LOS: 7   Brief Narrative   75 y.o. 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 He presented to our ED on 12/29 with fever/chills and fatigue and he tested positive for COVID-19.  Family and patient left prior to being seen by ED provider back then. Family reports more lethargy, progressive dyspnea, found to be hypoxic and was sent to ER on 03/16/20.     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  Sepsis physiology has improved.  Lactic acidosis resolved.  Chest x-ray on admission showed multifocal pneumonia, consistent with COVID-19.  Of note, his chest x-ray in the ED on 12/29 also showed pneumonia, however family took him home at that time. Oxygen requirement improving, down from 10 L/min >> 3 L/min. 11/13: Remains on 3 L/min supplemental oxygen Completed 5 days remdesivir -- Continue steroids, baricitinib -- Continue supplemental oxygen to maintain O2 sat above 88%, wean off as tolerated  Steroid-induced hyperglycemia -continue adjusting insulin for inpatient goal CBG 140-180 Type 2 diabetes - Started Lantus 10 units daily, Novolog 8 units TID WC + sliding scale   Elevated troponin -likely demand ischemia in the setting of hypoxia.  No  chest pain or acute ischemic changes on EKG.  Was initially on heparin drip, since discontinued.  Chronic diastolic CHF -appears clinically euvolemic.  Monitor volume status closely and consider Lasix.  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  Appears neurology recommendation in November was to continue Plavix, family's stated he is not taking this at home. --Outpatient neurology follow-up  Essential hypertension -chronic, stable.  Continue metoprolol, amlodipine  History of alcohol abuse -no recent heavy drinking per family  History of recent GI bleed -hospitalized for this following his stroke when he was on DAPT.  Currently not on antiplatelet therapy per family.   DVT prophylaxis: enoxaparin (LOVENOX) injection 40 mg Start: 03/17/20 1030   Diet:  Diet Orders (From admission, onward)    Start     Ordered   03/23/20 1210  DIET DYS 3 Room service appropriate? Yes with Assist; Fluid consistency: Thin  Diet effective now       Comments: CARB MODIFIED MINCED meats, w/ extra Gravy; potatoes too. Cream Soups. ENSURE TID meals.  Question Answer Comment  Room service appropriate? Yes with Assist   Fluid consistency: Thin      03/23/20 1210            Code Status: Full Code    Subjective 03/23/20    Patient seen up in  chair this morning.  No acute events reported.  Patient is nonverbal, nods yes to all questions asked.  Appears in no distress or discomfort.   Disposition Plan & Communication   Status is: Inpatient  Remains inpatient appropriate because:Inpatient level of care appropriate due to severity of illness, weaning off oxygen.   Dispo: The patient is from: Home              Anticipated d/c is to: SNF              Anticipated d/c date is: 2  days              Patient currently is not medically stable to d/c.    Family Communication: None at bedside, will attempt to call   Consults, Procedures, Significant Events   Consultants:   None  Procedures:   None  Antimicrobials:  Anti-infectives (From admission, onward)   Start     Dose/Rate Route Frequency Ordered Stop   03/17/20 1000  remdesivir 100 mg in sodium chloride 0.9 % 100 mL IVPB  Status:  Discontinued       "Followed by" Linked Group Details   100 mg 200 mL/hr over 30 Minutes Intravenous Daily 03/16/20 1405 03/16/20 1405   03/17/20 1000  remdesivir 100 mg in sodium chloride 0.9 % 100 mL IVPB       "Followed by" Linked Group Details   100 mg 200 mL/hr over 30 Minutes Intravenous Daily 03/16/20 1403 03/20/20 0917   03/16/20 1500  remdesivir 200 mg in sodium chloride 0.9% 250 mL IVPB       "Followed by" Linked Group Details   200 mg 580 mL/hr over 30 Minutes Intravenous Once 03/16/20 1403 03/16/20 1727   03/16/20 1345  remdesivir 200 mg in sodium chloride 0.9% 250 mL IVPB  Status:  Discontinued       "Followed by" Linked Group Details   200 mg 580 mL/hr over 30 Minutes Intravenous Once 03/16/20 1405 03/16/20 1405   03/16/20 1300  cefTRIAXone (ROCEPHIN) 2 g in sodium chloride 0.9 % 100 mL IVPB        2 g 200 mL/hr over 30 Minutes Intravenous  Once 03/16/20 1250 03/16/20 1403   03/16/20 1300  azithromycin (ZITHROMAX) 500 mg in sodium chloride 0.9 % 250 mL IVPB        500 mg 250 mL/hr over 60 Minutes Intravenous  Once 03/16/20 1250 03/16/20 1554        Objective   Vitals:   03/23/20 0518 03/23/20 0612 03/23/20 0757 03/23/20 1645  BP: (!) 183/103 (!) 188/83 (!) 172/91 (!) 169/88  Pulse: 72  88 95  Resp:   17 19  Temp: 97.7 F (36.5 C)  98.1 F (36.7 C) 98.2 F (36.8 C)  TempSrc: Oral   Axillary  SpO2: (!) 88%  91% 94%  Weight:      Height:        Intake/Output Summary (Last 24 hours) at 03/23/2020 1727 Last data filed at 03/23/2020 1646 Gross  per 24 hour  Intake 1560 ml  Output 1600 ml  Net -40 ml   Filed Weights   03/21/20 0435 03/22/20 0340 03/23/20 0312  Weight: 87.1 kg 83.5 kg 83.6 kg    Physical Exam:  General exam: awake, alert, no acute distress, nonverbal Respiratory system: Symmetric chest rise, normal respiratory effort, on 3 L/min oxygen. Cardiovascular system: RRR, no pedal edema.   Gastrointestinal system: soft, NT, ND Central nervous system: Unable to evaluate  due to patient not following commands Extremities: no edema, moves all Psychiatry: normal mood, flat affect, abnormal judgment and insight   Labs   Data Reviewed: I have personally reviewed following labs and imaging studies  CBC: Recent Labs  Lab 03/17/20 0540 03/18/20 0438 03/19/20 0543 03/20/20 0456 03/21/20 0455 03/22/20 0550 03/23/20 0623  WBC 7.7 8.6 9.9 12.3* 11.7* 12.8* 10.9*  NEUTROABS 6.1 6.9 8.5* 10.9* 9.0*  --   --   HGB 11.0* 11.3* 12.1* 13.4 13.8 13.3 14.1  HCT 33.4* 33.5* 37.1* 40.8 41.1 40.5 42.8  MCV 79.7* 78.5* 79.3* 78.9* 78.1* 78.0* 78.2*  PLT 290 347 409* 442* 515* 522* 174*   Basic Metabolic Panel: Recent Labs  Lab 03/17/20 0540 03/18/20 0438 03/19/20 0543 03/20/20 0456 03/21/20 0455 03/22/20 0550 03/23/20 0623  NA 142 141 141 141 144 145 145  K 4.2 4.1 3.8 3.7 3.7 3.7 4.4  CL 111 110 108 102 107 110 109  CO2 23 23 25 23 23 25 24   GLUCOSE 177* 253* 226* 271* 183* 189* 268*  BUN 16 20 19 20 23  31* 36*  CREATININE 0.92 0.93 0.84 1.00 0.95 1.01 1.14  CALCIUM 8.2* 8.4* 8.7* 8.8* 9.0 8.7* 9.0  MG 1.9 2.2 2.1 1.9 2.2  --   --   PHOS 3.5 3.3 3.3 3.5 3.2  --   --    GFR: Estimated Creatinine Clearance: 57.8 mL/min (by C-G formula based on SCr of 1.14 mg/dL). Liver Function Tests: Recent Labs  Lab 03/19/20 0543 03/20/20 0456 03/21/20 0455 03/22/20 0550 03/23/20 0623  AST 31 45* 33 28 31  ALT 19 28 24 25 30   ALKPHOS 59 67 64 65 71  BILITOT 0.7 1.1 1.2 1.1 1.1  PROT 6.9 7.9 7.5 6.7 7.0  ALBUMIN 2.7*  3.3* 3.1* 2.9* 3.0*   No results for input(s): LIPASE, AMYLASE in the last 168 hours. No results for input(s): AMMONIA in the last 168 hours. Coagulation Profile: No results for input(s): INR, PROTIME in the last 168 hours. Cardiac Enzymes: No results for input(s): CKTOTAL, CKMB, CKMBINDEX, TROPONINI in the last 168 hours. BNP (last 3 results) No results for input(s): PROBNP in the last 8760 hours. HbA1C: No results for input(s): HGBA1C in the last 72 hours. CBG: Recent Labs  Lab 03/22/20 2140 03/23/20 0756 03/23/20 1151 03/23/20 1154 03/23/20 1645  GLUCAP 235* 262* 519* 555* 339*   Lipid Profile: No results for input(s): CHOL, HDL, LDLCALC, TRIG, CHOLHDL, LDLDIRECT in the last 72 hours. Thyroid Function Tests: No results for input(s): TSH, T4TOTAL, FREET4, T3FREE, THYROIDAB in the last 72 hours. Anemia Panel: No results for input(s): VITAMINB12, FOLATE, FERRITIN, TIBC, IRON, RETICCTPCT in the last 72 hours. Sepsis Labs: Recent Labs  Lab 03/17/20 0540 03/17/20 0541 03/18/20 0438  PROCALCITON 0.17  --  0.14  LATICACIDVEN  --  1.4  --     Recent Results (from the past 240 hour(s))  Blood Culture (routine x 2)     Status: None   Collection Time: 03/16/20  9:47 AM   Specimen: BLOOD LEFT ARM  Result Value Ref Range Status   Specimen Description BLOOD LEFT ARM  Final   Special Requests   Final    BOTTLES DRAWN AEROBIC AND ANAEROBIC Blood Culture adequate volume   Culture   Final    NO GROWTH 5 DAYS Performed at [facility], [address] [place],  [address]    Report Status 03/21/2020 FINAL  Final  Blood Culture (routine x  2)     Status: None   Collection Time: 03/16/20  9:47 AM   Specimen: BLOOD  Result Value Ref Range Status   Specimen Description BLOOD LEFT ANTECUBITAL  Final   Special Requests   Final    BOTTLES DRAWN AEROBIC AND ANAEROBIC Blood Culture adequate volume   Culture   Final    NO GROWTH 5 DAYS Performed at [facility], [address] [place], [place] [address]    Report Status 03/21/2020 FINAL  Final  SARS CORONAVIRUS 2 (TAT 6-24 HRS)     Status: Abnormal   Collection Time: 03/17/20 10:00 AM  Result Value Ref Range Status   SARS Coronavirus 2 POSITIVE (A) NEGATIVE Final    Comment: (NOTE) SARS-CoV-2 target nucleic acids are DETECTED.  The SARS-CoV-2 RNA is generally detectable in upper and lower respiratory specimens during the acute phase of infection. Positive results are indicative of the presence of SARS-CoV-2 RNA. Clinical correlation with patient history and other diagnostic information is  necessary to determine patient infection status. Positive results do not rule out bacterial infection or co-infection with other viruses.  The expected result is Negative.  Fact Sheet for Patients: SugarRoll.be  Fact Sheet for Healthcare Providers: https://www.woods-mathews.com/  This test is not yet approved or cleared by the [place] FDA and  has been authorized for detection and/or diagnosis of SARS-CoV-2 by FDA under an Emergency Use Authorization (EUA). This EUA will remain  in effect (meaning this test can be used) for the duration of the COVID-19 declaration under Section 564(b)(1) of the Act, 21 U. S.C. section 360bbb-3(b)(1), unless the authorization is terminated or revoked sooner.   Performed at [facility], [place] [address], [place], [place] [address]       Imaging Studies   No results found.   Medications   Scheduled Meds: . amLODipine  10 mg Oral Daily  . atorvastatin  80 mg Oral Daily  . baricitinib  4 mg Oral Daily  . enoxaparin (LOVENOX) injection  40 mg Subcutaneous Q24H  . folic acid  1 mg Oral Daily  . insulin aspart  0-15 Units Subcutaneous TID WC  . insulin aspart  8 Units Subcutaneous TID WC  . insulin glargine  10 Units Subcutaneous Daily  . losartan  25 mg Oral Daily  . methylPREDNISolone (SOLU-MEDROL)  injection  45 mg Intravenous Q12H  . metoprolol tartrate  25 mg Oral BID  . pantoprazole  40 mg Oral Daily  . thiamine  100 mg Oral Daily   Continuous Infusions:     LOS: 7 days    Time spent: 25 minutes with greater than 50% spent at bedside and in coordination of care.    Ezekiel Slocumb, DO [facility]  03/23/2020, 5:27 PM    If 7PM-7AM, please contact night-coverage. How to contact the [facility] Attending or Consulting provider [place] or covering provider during after hours [place], for this patient?    1. Check the care team in [facility] and look for a) attending/consulting [facility] provider listed and b) the [facility] team listed 2. Log into [email or web address] and use [place]'s universal password to access. If you do not have the password, please contact the hospital operator. 3. Locate the [facility] provider you are looking for under [facility] and page to a number that you can be directly reached. 4. If you still have difficulty reaching the provider, please page the [facility] (Director on Call) for the Hospitalists listed on  amion for assistance.

## 2020-03-24 DIAGNOSIS — J1282 Pneumonia due to coronavirus disease 2019: Secondary | ICD-10-CM | POA: Diagnosis not present

## 2020-03-24 DIAGNOSIS — U071 COVID-19: Secondary | ICD-10-CM | POA: Diagnosis not present

## 2020-03-24 LAB — GLUCOSE, CAPILLARY
Glucose-Capillary: 71 mg/dL (ref 70–99)
Glucose-Capillary: 252 mg/dL — ABNORMAL HIGH (ref 70–99)
Glucose-Capillary: 209 mg/dL — ABNORMAL HIGH (ref 70–99)
Glucose-Capillary: 389 mg/dL — ABNORMAL HIGH (ref 70–99)

## 2020-03-24 LAB — CBC
HCT: 38.8 % — ABNORMAL LOW (ref 39.0–52.0)
Hemoglobin: 12.8 g/dL — ABNORMAL LOW (ref 13.0–17.0)
MCH: 26.3 pg (ref 26.0–34.0)
MCHC: 33 g/dL (ref 30.0–36.0)
MCV: 79.7 fL — ABNORMAL LOW (ref 80.0–100.0)
Platelets: 463 10*3/uL — ABNORMAL HIGH (ref 150–400)
RBC: 4.87 MIL/uL (ref 4.22–5.81)
RDW: 20.5 % — ABNORMAL HIGH (ref 11.5–15.5)
WBC: 15.9 10*3/uL — ABNORMAL HIGH (ref 4.0–10.5)
nRBC: 0.2 % (ref 0.0–0.2)

## 2020-03-24 LAB — COMPREHENSIVE METABOLIC PANEL
ALT: 27 U/L (ref 0–44)
AST: 33 U/L (ref 15–41)
Albumin: 2.7 g/dL — ABNORMAL LOW (ref 3.5–5.0)
Alkaline Phosphatase: 61 U/L (ref 38–126)
Anion gap: 9 (ref 5–15)
BUN: 33 mg/dL — ABNORMAL HIGH (ref 8–23)
CO2: 26 mmol/L (ref 22–32)
Calcium: 8.7 mg/dL — ABNORMAL LOW (ref 8.9–10.3)
Chloride: 111 mmol/L (ref 98–111)
Creatinine, Ser: 1.1 mg/dL (ref 0.61–1.24)
GFR, Estimated: >60 mL/min (ref >60)
Glucose, Bld: 66 mg/dL — ABNORMAL LOW (ref 70–99)
Potassium: 4.1 mmol/L (ref 3.5–5.1)
Sodium: 146 mmol/L — ABNORMAL HIGH (ref 135–145)
Total Bilirubin: 1 mg/dL (ref 0.3–1.2)
Total Protein: 6.4 g/dL — ABNORMAL LOW (ref 6.5–8.1)

## 2020-03-24 LAB — C-REACTIVE PROTEIN: CRP: 0.7 mg/dL (ref <1.0)

## 2020-03-24 LAB — FIBRIN DERIVATIVES D-DIMER (ARMC ONLY): Fibrin derivatives D-dimer (ARMC): 1658.9 ng/mL (FEU) — ABNORMAL HIGH (ref 0.00–499.00)

## 2020-03-24 MED ORDER — LOSARTAN POTASSIUM 50 MG PO TABS
50.0000 mg | ORAL_TABLET | Freq: Every day | ORAL | Status: DC
  Administered 2020-03-25: 50 mg via ORAL
  Filled 2020-03-24 (×2): qty 1

## 2020-03-24 NOTE — Progress Notes (Signed)
Physical Therapy Treatment Patient Details Name: Malik [name] MRN: [id number] DOB: 12/27/1945 Today's Date: 03/24/2020    History of Present Illness 75 y.o. 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 He presented to our ED on 12/29 with fever/chills and fatigue and he tested positive for COVID-19.  He left AMA prior to being seen by ED provider. Family reports more lethargy, found to be hypoxic and was sent back to the ER.  MD assessment includes: Acute Hypoxic Respiratory Failure due to Covid-19 viral illness, severe sepsis, elevated troponin likely demand ischemia, and recent GI bleed.    PT Comments    Pt found in bed with bedding completely soiled, stating he is "cold." RN notified and cleared PT for session. The pt is able to participate in bed mobility and transfers. He presents with tone of the R side which limited initiation of sit<>stand transfers. He also requires assistance for facilitation of the RLE during stand pivot transfer. The pt continues to be appropriate for d/c plan.     Follow Up Recommendations  SNF     Equipment Recommendations  None recommended by PT    Recommendations for Other Services       Precautions / Restrictions Precautions Precautions: Fall Restrictions Other Position/Activity Restrictions: Keep O2 >94%    Mobility  Bed Mobility Overal bed mobility: Needs Assistance Bed Mobility: Supine to Sit     Supine to sit: Mod assist        Transfers Overall transfer level: Needs assistance Equipment used: Quad cane Transfers: Sit to/from Stand Sit to Stand: Mod assist Stand pivot transfers: Min assist       General transfer comment: Pt presenting with increased tone of the R side, requiring Mod A for initiation of stand. Once in  standing his extensor tone of the R side assist with stability in upright position.  Ambulation/Gait                 Stairs             Wheelchair Mobility    Modified Rankin (Stroke Patients Only)       Balance   Sitting-balance support: Feet supported Sitting balance-Leahy Scale: Good     Standing balance support: Single extremity supported Standing balance-Leahy Scale: Fair                              Cognition Arousal/Alertness: Awake/alert Behavior During Therapy: WFL for tasks assessed/performed;Flat affect Overall Cognitive Status: No family/caregiver present to determine baseline cognitive functioning                                 General Comments: Pt followed all one step commands and answered all yes/no questions appropriately.      Exercises      General Comments        Pertinent Vitals/Pain Pain Assessment: No/denies pain    Home Living Family/patient expects to be discharged to:: Private residence Living Arrangements: Alone                  Prior Function            PT Goals (current goals can now be found in the  care plan section) Acute Rehab PT Goals Patient Stated Goal: none stated PT Goal Formulation: Patient unable to participate in goal setting Time For Goal Achievement: 04/02/20 Potential to Achieve Goals: Good Progress towards PT goals: Progressing toward goals    Frequency    Min 2X/week      PT Plan Current plan remains appropriate    Co-evaluation              AM-PAC PT "6 Clicks" Mobility   Outcome Measure  Help needed turning from your back to your side while in a flat bed without using bedrails?: A Little Help needed moving from lying on your back to sitting on the side of a flat bed without using bedrails?: A Lot Help needed moving to and from a bed to a chair (including a wheelchair)?: A Little Help needed standing up from a chair using your arms (e.g.,  wheelchair or bedside chair)?: A Lot Help needed to walk in hospital room?: A Lot Help needed climbing 3-5 steps with a railing? : Total 6 Click Score: 13    End of Session Equipment Utilized During Treatment: Oxygen;Gait belt Activity Tolerance: Patient tolerated treatment well Patient left: in chair;with call bell/phone within reach;with chair alarm set Nurse Communication: Mobility status PT Visit Diagnosis: Unsteadiness on feet (R26.81);Other abnormalities of gait and mobility (R26.89);Muscle weakness (generalized) (M62.81)     Time: 9767-3419 PT Time Calculation (min) (ACUTE ONLY): 35 min  Charges:  $Gait Training: 23-37 mins                    10:26 AM, 03/24/20 Daniyla Pfahler A. Saverio Danker PT, DPT Physical Therapist - [facility]    Laurrie Toppin A Carrieanne Kleen 03/24/2020, 10:23 AM

## 2020-03-24 NOTE — Progress Notes (Signed)
Hypoglycemic Event  CBG: 50 2206  Treatment: 25g D50 2219  Symptoms: none  Follow-up CBG: Time: 2240 CBG Result: 120  Possible Reasons for Event: CBG 555 around lunch time. Intervention: 20 units novolog given.  Comments/MD notified: Randol Kern NP    Christophe Louis

## 2020-03-24 NOTE — Progress Notes (Signed)
Patient in recliner by OT with call bell in reach. This RN did complete bed change and documented. Patient has no needs at this time. Watching television in recliner. Will continue to assess.

## 2020-03-24 NOTE — Care Management Important Message (Signed)
Important Message  Patient Details  Name: Malik [name] MRN: [id number] Date of Birth: 1946-02-06   Medicare Important Message Given:  Other (see comment)  Attempted to review with son, Trafton Roker, Brooke Bonito via cell, however phone went straight to voicemail and mailbox full.  Will attempt at later time.     Dannette Barbara 03/24/2020, 2:36 PM

## 2020-03-24 NOTE — Progress Notes (Signed)
No novolog given this morning by this RN. CBG 71. Notified MD Arbutus Ped via secure chat.

## 2020-03-24 NOTE — Progress Notes (Signed)
Patient transported to 229 via this RN and Faith NT. Receiving NT in room when we arrived ready to receive patient. Patient's oxygen attached to wall oxygen and running at 3L. Patient's male external cath also set up to suction in room. Patient resting comfortably in bed at time of my departure with bed alarm on.

## 2020-03-24 NOTE — Progress Notes (Signed)
Patient's evening dose of SSI plus 8 Units given. Patient also given Chocolate Ensure at this time.

## 2020-03-24 NOTE — Progress Notes (Signed)
PROGRESS NOTE    Malik [name]   AOZ:[id number]  DOB: [birth year]  PCP: [facility], NP    DOA: 03/16/2020 LOS: 8   Brief Narrative   75 y.o. 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 He presented to our ED on 12/29 with fever/chills and fatigue and he tested positive for COVID-19.  Family and patient left prior to being seen by ED provider back then. Family reports more lethargy, progressive dyspnea, found to be hypoxic and was sent to ER on 03/16/20.     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  Sepsis physiology has improved.  Lactic acidosis resolved.  Chest x-ray on admission showed multifocal pneumonia, consistent with COVID-19.  Of note, his chest x-ray in the ED on 12/29 also showed pneumonia, however family took him home at that time. Oxygen requirement improving, down from 10 L/min >> 3 L/min. 1/13-14: Remains on 3 L/min supplemental oxygen Completed 5 days remdesivir -- Continue steroids, baricitinib -- Continue supplemental oxygen to maintain O2 sat above 88%, wean off as tolerated  Steroid-induced hyperglycemia -continue adjusting insulin for inpatient goal CBG 140-180 Type 2 diabetes - Started Lantus 10 units daily, Novolog 8 units TID WC + sliding scale   Elevated troponin -likely demand ischemia in the setting of hypoxia.  No  chest pain or acute ischemic changes on EKG.  Was initially on heparin drip, since discontinued.  Chronic diastolic CHF -appears clinically euvolemic.  Monitor volume status closely and consider Lasix.  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  Appears neurology recommendation in November was to continue Plavix, family's stated he is not taking this at home. --Outpatient neurology follow-up  Essential hypertension -chronic, stable.  Continue metoprolol, amlodipine  History of alcohol abuse -no recent heavy drinking per family  History of recent GI bleed -hospitalized for this following his stroke when he was on DAPT.  Currently not on antiplatelet therapy per family.   DVT prophylaxis: enoxaparin (LOVENOX) injection 40 mg Start: 03/17/20 1030   Diet:  Diet Orders (From admission, onward)    Start     Ordered   03/23/20 1210  DIET DYS 3 Room service appropriate? Yes with Assist; Fluid consistency: Thin  Diet effective now       Comments: CARB MODIFIED MINCED meats, w/ extra Gravy; potatoes too. Cream Soups. ENSURE TID meals.  Question Answer Comment  Room service appropriate? Yes with Assist   Fluid consistency: Thin      03/23/20 1210            Code Status: Full Code    Subjective 03/24/20    Patient seen up in  chair this morning.  He nods yes to all questions, consistent with his baseline dementia.   Patient had episode of hypoglycemia yesterday evening and morning scheduled NovoLog was held for CBG of 71.   Disposition Plan & Communication   Status is: Inpatient  Remains inpatient appropriate because:Inpatient level of care appropriate due to severity of illness, weaning off oxygen.  Hypoglycemic episode within the past 24 hours warrants further  close monitoring.  SNF placement pending.   Dispo: The patient is from: Home              Anticipated d/c is to: SNF              Anticipated d/c date is: Pending SNF placement              Patient currently is not medically stable to d/c.    Family Communication: None at bedside, will attempt to call   Consults, Procedures, Significant Events   Consultants:   None  Procedures:   None  Antimicrobials:  Anti-infectives (From admission, onward)   Start     Dose/Rate Route Frequency Ordered Stop   03/17/20 1000  remdesivir 100 mg in sodium chloride 0.9 % 100 mL IVPB  Status:  Discontinued       "Followed by" Linked Group Details   100 mg 200 mL/hr over 30 Minutes Intravenous Daily 03/16/20 1405 03/16/20 1405   03/17/20 1000  remdesivir 100 mg in sodium chloride 0.9 % 100 mL IVPB       "Followed by" Linked Group Details   100 mg 200 mL/hr over 30 Minutes Intravenous Daily 03/16/20 1403 03/20/20 0917   03/16/20 1500  remdesivir 200 mg in sodium chloride 0.9% 250 mL IVPB       "Followed by" Linked Group Details   200 mg 580 mL/hr over 30 Minutes Intravenous Once 03/16/20 1403 03/16/20 1727   03/16/20 1345  remdesivir 200 mg in sodium chloride 0.9% 250 mL IVPB  Status:  Discontinued       "Followed by" Linked Group Details   200 mg 580 mL/hr over 30 Minutes Intravenous Once 03/16/20 1405 03/16/20 1405   03/16/20 1300  cefTRIAXone (ROCEPHIN) 2 g in sodium chloride 0.9 % 100 mL IVPB        2 g 200 mL/hr over 30 Minutes Intravenous  Once 03/16/20 1250 03/16/20 1403   03/16/20 1300  azithromycin (ZITHROMAX) 500 mg in sodium chloride 0.9 % 250 mL IVPB        500 mg 250 mL/hr over 60 Minutes Intravenous  Once 03/16/20 1250 03/16/20 1554        Objective   Vitals:   03/23/20 2156 03/23/20 2157 03/24/20 0355 03/24/20 0754  BP: (!) 154/69 (!) 154/69 (!) 164/87 (!) 155/69  Pulse: 88 88 69 79  Resp:  20 20 15   Temp:  97.9 F (36.6 C) 97.8 F (36.6 C) 98.1 F (36.7 C)   TempSrc:  Oral Oral Oral  SpO2:  93% 93% 91%  Weight:      Height:        Intake/Output Summary (Last 24 hours) at 03/24/2020 1715 Last data filed at 03/24/2020 1036 Gross per 24 hour  Intake 240 ml  Output 150 ml  Net 90 ml   Filed Weights   03/21/20 0435 03/22/20 0340 03/23/20 0312  Weight: 87.1 kg 83.5 kg 83.6 kg    Physical Exam:  General exam: awake, alert, no acute distress, nonverbal, up in chair Respiratory system: On  3 L/min nasal cannula oxygen, normal respiratory effort, no accessory muscle use Cardiovascular system: RRR, no pedal edema.   Gastrointestinal system: soft, NT, ND Extremities: no edema, moves all   Labs   Data Reviewed: I have personally reviewed following labs and imaging studies  CBC: Recent Labs  Lab 03/18/20 0438 03/19/20 0543 03/20/20 0456 03/21/20 0455 03/22/20 0550 03/23/20 0623 03/24/20 0433  WBC 8.6 9.9 12.3* 11.7* 12.8* 10.9* 15.9*  NEUTROABS 6.9 8.5* 10.9* 9.0*  --   --   --   HGB 11.3* 12.1* 13.4 13.8 13.3 14.1 12.8*  HCT 33.5* 37.1* 40.8 41.1 40.5 42.8 38.8*  MCV 78.5* 79.3* 78.9* 78.1* 78.0* 78.2* 79.7*  PLT 347 409* 442* 515* 522* 496* [id number]*   Basic Metabolic Panel: Recent Labs  Lab 03/18/20 0438 03/19/20 0543 03/20/20 0456 03/21/20 0455 03/22/20 0550 03/23/20 0623 03/24/20 0433  NA 141 141 141 144 145 145 146*  K 4.1 3.8 3.7 3.7 3.7 4.4 4.1  CL 110 108 102 107 110 109 111  CO2 23 25 23 23 25 24 26   GLUCOSE 253* 226* 271* 183* 189* 268* 66*  BUN 20 19 20 23  31* 36* 33*  CREATININE 0.93 0.84 1.00 0.95 1.01 1.14 1.10  CALCIUM 8.4* 8.7* 8.8* 9.0 8.7* 9.0 8.7*  MG 2.2 2.1 1.9 2.2  --   --   --   PHOS 3.3 3.3 3.5 3.2  --   --   --    GFR: Estimated Creatinine Clearance: 59.9 mL/min (by C-G formula based on SCr of 1.1 mg/dL). Liver Function Tests: Recent Labs  Lab 03/20/20 0456 03/21/20 0455 03/22/20 0550 03/23/20 0623 03/24/20 0433  AST 45* 33 28 31 33  ALT 28 24 25 30 27   ALKPHOS 67 64 65 71 61  BILITOT  1.1 1.2 1.1 1.1 1.0  PROT 7.9 7.5 6.7 7.0 6.4*  ALBUMIN 3.3* 3.1* 2.9* 3.0* 2.7*   No results for input(s): LIPASE, AMYLASE in the last 168 hours. No results for input(s): AMMONIA in the last 168 hours. Coagulation Profile: No results for input(s): INR, PROTIME in the last 168 hours. Cardiac Enzymes: No results for input(s): CKTOTAL, CKMB, CKMBINDEX, TROPONINI in the last 168 hours. BNP (last 3 results) No results for input(s): PROBNP in the last 8760 hours. HbA1C: No results for input(s): HGBA1C in the last 72 hours. CBG: Recent Labs  Lab 03/23/20 2206 03/23/20 2240 03/24/20 0756 03/24/20 1154 03/24/20 1633  GLUCAP 50* 120* 71 252* 209*   Lipid Profile: No results for input(s): CHOL, HDL, LDLCALC, TRIG, CHOLHDL, LDLDIRECT in the last 72 hours. Thyroid Function Tests: No results for input(s): TSH, T4TOTAL, FREET4, T3FREE, THYROIDAB in the last 72 hours. Anemia Panel: No results for input(s): VITAMINB12, FOLATE, FERRITIN, TIBC, IRON, RETICCTPCT in the last 72 hours. Sepsis Labs: Recent Labs  Lab 03/18/20 0438  PROCALCITON 0.14    Recent Results (from the past 240 hour(s))  Blood Culture (routine x 2)     Status: None   Collection Time: 03/16/20  9:47 AM   Specimen: BLOOD LEFT ARM  Result Value Ref Range Status   Specimen Description BLOOD LEFT ARM  Final   Special Requests   Final    BOTTLES DRAWN AEROBIC AND ANAEROBIC Blood Culture adequate volume   Culture   Final    NO GROWTH 5 DAYS Performed at [facility], [address], [place], [place] [address]    Report Status 03/21/2020 FINAL  Final  Blood Culture (routine x 2)  Status: None   Collection Time: 03/16/20  9:47 AM   Specimen: BLOOD  Result Value Ref Range Status   Specimen Description BLOOD LEFT ANTECUBITAL  Final   Special Requests   Final    BOTTLES DRAWN AEROBIC AND ANAEROBIC Blood Culture adequate volume   Culture   Final    NO GROWTH 5 DAYS Performed at [facility], [address] [place], [place] [address]    Report Status 03/21/2020 FINAL  Final  SARS CORONAVIRUS 2 (TAT 6-24 HRS)     Status: Abnormal   Collection Time: 03/17/20 10:00 AM  Result Value Ref Range Status   SARS Coronavirus 2 POSITIVE (A) NEGATIVE Final    Comment: (NOTE) SARS-CoV-2 target nucleic acids are DETECTED.  The SARS-CoV-2 RNA is generally detectable in upper and lower respiratory specimens during the acute phase of infection. Positive results are indicative of the presence of SARS-CoV-2 RNA. Clinical correlation with patient history and other diagnostic information is  necessary to determine patient infection status. Positive results do not rule out bacterial infection or co-infection with other viruses.  The expected result is Negative.  Fact Sheet for Patients: SugarRoll.be  Fact Sheet for Healthcare Providers: https://www.woods-mathews.com/  This test is not yet approved or cleared by the [place] FDA and  has been authorized for detection and/or diagnosis of SARS-CoV-2 by FDA under an Emergency Use Authorization (EUA). This EUA will remain  in effect (meaning this test can be used) for the duration of the COVID-19 declaration under Section 564(b)(1) of the Act, 21 U. S.C. section 360bbb-3(b)(1), unless the authorization is terminated or revoked sooner.   Performed at [facility], [place] [address] [place], [place] [address]       Imaging Studies   No results found.   Medications   Scheduled Meds: . amLODipine  10 mg Oral Daily  . atorvastatin  80 mg Oral Daily  . baricitinib  4 mg Oral Daily  . enoxaparin (LOVENOX) injection  40 mg Subcutaneous Q24H  . folic acid  1 mg Oral Daily  . insulin aspart  0-15 Units Subcutaneous TID WC  . insulin aspart  8 Units Subcutaneous TID WC  . insulin glargine  10 Units Subcutaneous Daily  . [START ON 03/25/2020] losartan  50 mg Oral Daily  . metoprolol tartrate   25 mg Oral BID  . pantoprazole  40 mg Oral Daily  . predniSONE  40 mg Oral Q breakfast  . thiamine  100 mg Oral Daily   Continuous Infusions:     LOS: 8 days    Time spent: 20 minutes    Ezekiel Slocumb, DO [facility]  03/24/2020, 5:15 PM    If 7PM-7AM, please contact night-coverage. How to contact the [facility] Attending or Consulting provider [place] or covering provider during after hours [place], for this patient?    1. Check the care team in [facility] and look for a) attending/consulting [facility] provider listed and b) the [facility] team listed 2. Log into [email or web address] and use [place]'s universal password to access. If you do not have the password, please contact the hospital operator. 3. Locate the [facility] provider you are looking for under [facility] and page to a number that you can be directly reached. 4. If you still have difficulty reaching the provider, please page the [facility] (Director on Call) for the Hospitalists listed on amion for assistance.

## 2020-03-25 DIAGNOSIS — R4701 Aphasia: Secondary | ICD-10-CM | POA: Diagnosis not present

## 2020-03-25 DIAGNOSIS — I5032 Chronic diastolic (congestive) heart failure: Secondary | ICD-10-CM | POA: Diagnosis not present

## 2020-03-25 DIAGNOSIS — I10 Essential (primary) hypertension: Secondary | ICD-10-CM | POA: Diagnosis not present

## 2020-03-25 DIAGNOSIS — U071 COVID-19: Secondary | ICD-10-CM | POA: Diagnosis not present

## 2020-03-25 LAB — CBC
HCT: 38.8 % — ABNORMAL LOW (ref 39.0–52.0)
Hemoglobin: 13 g/dL (ref 13.0–17.0)
MCH: 26.2 pg (ref 26.0–34.0)
MCHC: 33.5 g/dL (ref 30.0–36.0)
MCV: 78.1 fL — ABNORMAL LOW (ref 80.0–100.0)
Platelets: 399 10*3/uL (ref 150–400)
RBC: 4.97 MIL/uL (ref 4.22–5.81)
RDW: 21.2 % — ABNORMAL HIGH (ref 11.5–15.5)
WBC: 18.5 10*3/uL — ABNORMAL HIGH (ref 4.0–10.5)
nRBC: 0 % (ref 0.0–0.2)

## 2020-03-25 LAB — COMPREHENSIVE METABOLIC PANEL
ALT: 30 U/L (ref 0–44)
AST: 26 U/L (ref 15–41)
Albumin: 2.6 g/dL — ABNORMAL LOW (ref 3.5–5.0)
Alkaline Phosphatase: 66 U/L (ref 38–126)
Anion gap: 8 (ref 5–15)
BUN: 26 mg/dL — ABNORMAL HIGH (ref 8–23)
CO2: 25 mmol/L (ref 22–32)
Calcium: 8.7 mg/dL — ABNORMAL LOW (ref 8.9–10.3)
Chloride: 107 mmol/L (ref 98–111)
Creatinine, Ser: 0.86 mg/dL (ref 0.61–1.24)
GFR, Estimated: >60 mL/min (ref >60)
Glucose, Bld: 141 mg/dL — ABNORMAL HIGH (ref 70–99)
Potassium: 4.1 mmol/L (ref 3.5–5.1)
Sodium: 140 mmol/L (ref 135–145)
Total Bilirubin: 1.1 mg/dL (ref 0.3–1.2)
Total Protein: 6.2 g/dL — ABNORMAL LOW (ref 6.5–8.1)

## 2020-03-25 LAB — GLUCOSE, CAPILLARY
Glucose-Capillary: 130 mg/dL — ABNORMAL HIGH (ref 70–99)
Glucose-Capillary: 184 mg/dL — ABNORMAL HIGH (ref 70–99)
Glucose-Capillary: 184 mg/dL — ABNORMAL HIGH (ref 70–99)
Glucose-Capillary: 136 mg/dL — ABNORMAL HIGH (ref 70–99)

## 2020-03-25 LAB — C-REACTIVE PROTEIN: CRP: 2.6 mg/dL — ABNORMAL HIGH (ref <1.0)

## 2020-03-25 LAB — FIBRIN DERIVATIVES D-DIMER (ARMC ONLY): Fibrin derivatives D-dimer (ARMC): 1856.57 ng/mL (FEU) — ABNORMAL HIGH (ref 0.00–499.00)

## 2020-03-25 NOTE — Progress Notes (Signed)
PROGRESS NOTE    Malik [name]   [id number]  DOB: 05/16/1945  PCP: Center, Laie, NP    DOA: 03/16/2020 LOS: 9   Brief Narrative   74 y.o. 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 He presented to our ED on 12/29 with fever/chills and fatigue and he tested positive for COVID-19.  Family and patient left prior to being seen by ED provider back then. Family reports more lethargy, progressive dyspnea, found to be hypoxic and was sent to ER on 03/16/20.     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  Sepsis physiology has improved.  Lactic acidosis resolved.  Chest x-ray on admission showed multifocal pneumonia, consistent with COVID-19.  Of note, his chest x-ray in the ED on 12/29 also showed pneumonia, however family took him home at that time. Oxygen requirement improving, down from 10 L/min >> 3 L/min. 1/13-14: Remains on 3 L/min supplemental oxygen Completed 5 days remdesivir -- Continue steroids, baricitinib -- Continue supplemental oxygen to maintain O2 sat above 88%, wean off as tolerated  Steroid-induced hyperglycemia -continue adjusting insulin for inpatient goal CBG 140-180 Type 2 diabetes - Started Lantus 10 units daily, Novolog 8 units TID WC + sliding scale   Elevated troponin -likely demand ischemia in the setting of hypoxia.  No  chest pain or acute ischemic changes on EKG.  Was initially on heparin drip, since discontinued.  Chronic diastolic CHF -appears clinically euvolemic.  Monitor volume status closely and consider Lasix.  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  Appears neurology recommendation in November was to continue Plavix, family's stated he is not taking this at home. --Outpatient neurology follow-up  Essential hypertension -chronic, stable.  Continue metoprolol, amlodipine  History of alcohol abuse -no recent heavy drinking per family  History of recent GI bleed -hospitalized for this following his stroke when he was on DAPT.  Currently not on antiplatelet therapy per family.   DVT prophylaxis: enoxaparin (LOVENOX) injection 40 mg Start: 03/17/20 1030   Diet:  Diet Orders (From admission, onward)    Start     Ordered   03/23/20 1210  DIET DYS 3 Room service appropriate? Yes with Assist; Fluid consistency: Thin  Diet effective now       Comments: CARB MODIFIED MINCED meats, w/ extra Gravy; potatoes too. Cream Soups. ENSURE TID meals.  Question Answer Comment  Room service appropriate? Yes with Assist   Fluid consistency: Thin      03/23/20 1210            Code Status: Full Code    Subjective 03/25/20    Patient resting in bed,  woke up easily.  Nonverbal.  Appears in no distress.  No acute events reported.   Disposition Plan & Communication   Status is: Inpatient  Remains inpatient appropriate because:Inpatient level of care appropriate due to severity of illness, weaning off oxygen.  Hypoglycemic episode within the past 24 hours warrants further close monitoring.  SNF placement pending.   Dispo: The patient is from: Home              Anticipated d/c is to:  SNF              Anticipated d/c date is: Pending SNF placement              Patient currently is not medically stable to d/c.    Family Communication: None at bedside, will attempt to call   Consults, Procedures, Significant Events   Consultants:   None  Procedures:   None  Antimicrobials:  Anti-infectives (From admission, onward)   Start     Dose/Rate Route Frequency Ordered Stop   03/17/20 1000  remdesivir 100 mg in sodium chloride 0.9 % 100 mL IVPB  Status:  Discontinued       "Followed by" Linked Group Details   100 mg 200 mL/hr over 30 Minutes Intravenous Daily 03/16/20 1405 03/16/20 1405   03/17/20 1000  remdesivir 100 mg in sodium chloride 0.9 % 100 mL IVPB       "Followed by" Linked Group Details   100 mg 200 mL/hr over 30 Minutes Intravenous Daily 03/16/20 1403 03/20/20 0917   03/16/20 1500  remdesivir 200 mg in sodium chloride 0.9% 250 mL IVPB       "Followed by" Linked Group Details   200 mg 580 mL/hr over 30 Minutes Intravenous Once 03/16/20 1403 03/16/20 1727   03/16/20 1345  remdesivir 200 mg in sodium chloride 0.9% 250 mL IVPB  Status:  Discontinued       "Followed by" Linked Group Details   200 mg 580 mL/hr over 30 Minutes Intravenous Once 03/16/20 1405 03/16/20 1405   03/16/20 1300  cefTRIAXone (ROCEPHIN) 2 g in sodium chloride 0.9 % 100 mL IVPB        2 g 200 mL/hr over 30 Minutes Intravenous  Once 03/16/20 1250 03/16/20 1403   03/16/20 1300  azithromycin (ZITHROMAX) 500 mg in sodium chloride 0.9 % 250 mL IVPB        500 mg 250 mL/hr over 60 Minutes Intravenous  Once 03/16/20 1250 03/16/20 1554        Objective   Vitals:   03/24/20 2305 03/25/20 0512 03/25/20 1210 03/25/20 1700  BP: (!) 157/90 (!) 159/90 (!) 151/77   Pulse: 79 65 77   Resp: 16 16 16    Temp: 97.9 F (36.6 C) 97.6 F (36.4 C) 97.7 F (36.5 C)   TempSrc: Oral Oral Oral   SpO2: 93% 91% (!) 88% 98%  Weight:      Height:        Intake/Output Summary (Last 24 hours) at  03/25/2020 2001 Last data filed at 03/25/2020 1700 Gross per 24 hour  Intake --  Output 1100 ml  Net -1100 ml   Filed Weights   03/21/20 0435 03/22/20 0340 03/23/20 0312  Weight: 87.1 kg 83.5 kg 83.6 kg    Physical Exam:  General exam: sleeping woke easily, no acute distress, nonverbal Respiratory system: CTAB, on 3 L/min nasal cannula oxygen, normal respiratory effort Cardiovascular system: RRR, no pedal edema.   Gastrointestinal system: soft, nontender  Labs   Data Reviewed: I have personally reviewed following labs and imaging studies  CBC: Recent Labs  Lab 03/19/20 0543 03/20/20 0456 03/21/20 0455 03/22/20 0550 03/23/20 0623 03/24/20 0433 03/25/20 0655  WBC 9.9 12.3* 11.7* 12.8* 10.9* 15.9* 18.5*  NEUTROABS 8.5* 10.9* 9.0*  --   --   --   --   HGB 12.1* 13.4 13.8 13.3 14.1 12.8* 13.0  HCT 37.1* 40.8 41.1 40.5 42.8 38.8* 38.8*  MCV 79.3* 78.9* 78.1* 78.0* 78.2* 79.7* 78.1*  PLT 409* 442* 515* 522* 496* 463* [id number]   Basic Metabolic Panel: Recent Labs  Lab 03/19/20 0543 03/20/20 0456 03/21/20 0455 03/22/20 0550 03/23/20 0623 03/24/20 0433 03/25/20 0655  NA 141 141 144 145 145 146* 140  K 3.8 3.7 3.7 3.7 4.4 4.1 4.1  CL 108 102 107 110 109 111 107  CO2 25 23 23 25 24 26 25   GLUCOSE 226* 271* 183* 189* 268* 66* 141*  BUN 19 20 23  31* 36* 33* 26*  CREATININE 0.84 1.00 0.95 1.01 1.14 1.10 0.86  CALCIUM 8.7* 8.8* 9.0 8.7* 9.0 8.7* 8.7*  MG 2.1 1.9 2.2  --   --   --   --   PHOS 3.3 3.5 3.2  --   --   --   --    GFR: Estimated Creatinine Clearance: 76.6 mL/min (by C-G formula based on SCr of 0.86 mg/dL). Liver Function Tests: Recent Labs  Lab 03/21/20 0455 03/22/20 0550 03/23/20 0623 03/24/20 0433 03/25/20 0655  AST 33 28 31 33 26  ALT 24 25 30 27 30   ALKPHOS 64 65 71 61 66  BILITOT 1.2 1.1 1.1 1.0 1.1  PROT 7.5 6.7 7.0 6.4* 6.2*  ALBUMIN 3.1* 2.9* 3.0* 2.7* 2.6*   No results for input(s): LIPASE, AMYLASE in the last 168 hours. No results for  input(s): AMMONIA in the last 168 hours. Coagulation Profile: No results for input(s): INR, PROTIME in the last 168 hours. Cardiac Enzymes: No results for input(s): CKTOTAL, CKMB, CKMBINDEX, TROPONINI in the last 168 hours. BNP (last 3 results) No results for input(s): PROBNP in the last 8760 hours. HbA1C: No results for input(s): HGBA1C in the last 72 hours. CBG: Recent Labs  Lab 03/24/20 1633 03/24/20 2104 03/25/20 0755 03/25/20 1202 03/25/20 1701  GLUCAP 209* 389* 130* 184* 184*   Lipid Profile: No results for input(s): CHOL, HDL, LDLCALC, TRIG, CHOLHDL, LDLDIRECT in the last 72 hours. Thyroid Function Tests: No results for input(s): TSH, T4TOTAL, FREET4, T3FREE, THYROIDAB in the last 72 hours. Anemia Panel: No results for input(s): VITAMINB12, FOLATE, FERRITIN, TIBC, IRON, RETICCTPCT in the last 72 hours. Sepsis Labs: No results for input(s): PROCALCITON, LATICACIDVEN in the last 168 hours.  Recent Results (from the past 240 hour(s))  Blood Culture (routine x 2)     Status: None   Collection Time: 03/16/20  9:47 AM   Specimen: BLOOD LEFT ARM  Result Value Ref Range Status   Specimen Description BLOOD LEFT ARM  Final   Special Requests   Final    BOTTLES DRAWN AEROBIC AND ANAEROBIC Blood Culture adequate volume   Culture   Final    NO GROWTH 5 DAYS Performed at [facility], [address], [place], [place] [address]    Report Status 03/21/2020 FINAL  Final  Blood Culture (routine x 2)     Status: None   Collection Time: 03/16/20  9:47 AM   Specimen: BLOOD  Result Value Ref Range Status  Specimen Description BLOOD LEFT ANTECUBITAL  Final   Special Requests   Final    BOTTLES DRAWN AEROBIC AND ANAEROBIC Blood Culture adequate volume   Culture   Final    NO GROWTH 5 DAYS Performed at [facility], [address] [place], [place] [address]    Report Status 03/21/2020 FINAL  Final  SARS CORONAVIRUS 2 (TAT 6-24 HRS)     Status: Abnormal    Collection Time: 03/17/20 10:00 AM  Result Value Ref Range Status   SARS Coronavirus 2 POSITIVE (A) NEGATIVE Final    Comment: (NOTE) SARS-CoV-2 target nucleic acids are DETECTED.  The SARS-CoV-2 RNA is generally detectable in upper and lower respiratory specimens during the acute phase of infection. Positive results are indicative of the presence of SARS-CoV-2 RNA. Clinical correlation with patient history and other diagnostic information is  necessary to determine patient infection status. Positive results do not rule out bacterial infection or co-infection with other viruses.  The expected result is Negative.  Fact Sheet for Patients: SugarRoll.be  Fact Sheet for Healthcare Providers: https://www.woods-mathews.com/  This test is not yet approved or cleared by the [place] FDA and  has been authorized for detection and/or diagnosis of SARS-CoV-2 by FDA under an Emergency Use Authorization (EUA). This EUA will remain  in effect (meaning this test can be used) for the duration of the COVID-19 declaration under Section 564(b)(1) of the Act, 21 U. S.C. section 360bbb-3(b)(1), unless the authorization is terminated or revoked sooner.   Performed at [facility], [place] [address] [place],  [address]       Imaging Studies   No results found.   Medications   Scheduled Meds: . amLODipine  10 mg Oral Daily  . atorvastatin  80 mg Oral Daily  . baricitinib  4 mg Oral Daily  . enoxaparin (LOVENOX) injection  40 mg Subcutaneous Q24H  . folic acid  1 mg Oral Daily  . insulin aspart  0-15 Units Subcutaneous TID WC  . insulin aspart  8 Units Subcutaneous TID WC  . insulin glargine  10 Units Subcutaneous Daily  . losartan  50 mg Oral Daily  . metoprolol tartrate  25 mg Oral BID  . pantoprazole  40 mg Oral Daily  . predniSONE  40 mg Oral Q breakfast  . thiamine  100 mg Oral Daily   Continuous Infusions:     LOS: 9  days    Time spent: 20 minutes    Ezekiel Slocumb, DO [facility]  03/25/2020, 8:01 PM    If 7PM-7AM, please contact night-coverage. How to contact the [facility] Attending or Consulting provider [place] or covering provider during after hours [place], for this patient?    1. Check the care team in [facility] and look for a) attending/consulting TRH provider listed and b) the [facility] team listed 2. Log into [email or web address] and use [place]'s universal password to access. If you do not have the password, please contact the hospital operator. 3. Locate the [facility] provider you are looking for under [facility] and page to a number that you can be directly reached. 4. If you still have difficulty reaching the provider, please page the [facility] (Director on Call) for the Hospitalists listed on amion for assistance.

## 2020-03-25 NOTE — Plan of Care (Signed)
Continuing with plan of care. 

## 2020-03-26 DIAGNOSIS — E119 Type 2 diabetes mellitus without complications: Secondary | ICD-10-CM | POA: Diagnosis not present

## 2020-03-26 DIAGNOSIS — I5032 Chronic diastolic (congestive) heart failure: Secondary | ICD-10-CM | POA: Diagnosis not present

## 2020-03-26 DIAGNOSIS — U071 COVID-19: Secondary | ICD-10-CM | POA: Diagnosis not present

## 2020-03-26 DIAGNOSIS — R4701 Aphasia: Secondary | ICD-10-CM | POA: Diagnosis not present

## 2020-03-26 LAB — COMPREHENSIVE METABOLIC PANEL
ALT: 29 U/L (ref 0–44)
AST: 31 U/L (ref 15–41)
Albumin: 2.7 g/dL — ABNORMAL LOW (ref 3.5–5.0)
Alkaline Phosphatase: 67 U/L (ref 38–126)
Anion gap: 10 (ref 5–15)
BUN: 22 mg/dL (ref 8–23)
CO2: 25 mmol/L (ref 22–32)
Calcium: 8.8 mg/dL — ABNORMAL LOW (ref 8.9–10.3)
Chloride: 107 mmol/L (ref 98–111)
Creatinine, Ser: 0.91 mg/dL (ref 0.61–1.24)
GFR, Estimated: >60 mL/min (ref >60)
Glucose, Bld: 80 mg/dL (ref 70–99)
Potassium: 4.1 mmol/L (ref 3.5–5.1)
Sodium: 142 mmol/L (ref 135–145)
Total Bilirubin: 1.3 mg/dL — ABNORMAL HIGH (ref 0.3–1.2)
Total Protein: 6.7 g/dL (ref 6.5–8.1)

## 2020-03-26 LAB — CBC
HCT: 40.3 % (ref 39.0–52.0)
Hemoglobin: 13.2 g/dL (ref 13.0–17.0)
MCH: 26 pg (ref 26.0–34.0)
MCHC: 32.8 g/dL (ref 30.0–36.0)
MCV: 79.3 fL — ABNORMAL LOW (ref 80.0–100.0)
Platelets: 397 10*3/uL (ref 150–400)
RBC: 5.08 MIL/uL (ref 4.22–5.81)
RDW: 21.5 % — ABNORMAL HIGH (ref 11.5–15.5)
WBC: 20 10*3/uL — ABNORMAL HIGH (ref 4.0–10.5)
nRBC: 0 % (ref 0.0–0.2)

## 2020-03-26 LAB — GLUCOSE, CAPILLARY
Glucose-Capillary: 105 mg/dL — ABNORMAL HIGH (ref 70–99)
Glucose-Capillary: 159 mg/dL — ABNORMAL HIGH (ref 70–99)
Glucose-Capillary: 203 mg/dL — ABNORMAL HIGH (ref 70–99)

## 2020-03-26 LAB — FIBRIN DERIVATIVES D-DIMER (ARMC ONLY): Fibrin derivatives D-dimer (ARMC): 1980.27 ng/mL (FEU) — ABNORMAL HIGH (ref 0.00–499.00)

## 2020-03-26 LAB — MAGNESIUM: Magnesium: 2 mg/dL (ref 1.7–2.4)

## 2020-03-26 LAB — C-REACTIVE PROTEIN: CRP: 1.9 mg/dL — ABNORMAL HIGH (ref <1.0)

## 2020-03-26 MED ORDER — LOSARTAN POTASSIUM 50 MG PO TABS
100.0000 mg | ORAL_TABLET | Freq: Every day | ORAL | Status: DC
  Administered 2020-03-26 – 2020-03-30 (×5): 100 mg via ORAL
  Filled 2020-03-26 (×4): qty 2

## 2020-03-26 NOTE — TOC Progression Note (Signed)
Transition of Care [facility]) - Progression Note    Patient Details  Name: Malik [name] MRN: [id number] Date of Birth: 06-09-1945  Transition of Care [facility]) CM/SW Contact  Zigmund Daniel Dorian Pod, RN Phone Number:[phone] 03/26/2020, 2:38 PM  Clinical Narrative:    RN spoke with son today Malik [name], Malik [name].) with update that no bed offers confirmed as of today. Addressed all questions and encouraged caregiver to possibly expand to other areas for increase in possible bed offers for SNF placement. Caregiver will consider and alert TOC team accordingly.   TOC team will continue to follow.        Expected Discharge Plan and Services                                                 Social Determinants of Health (SDOH) Interventions    Readmission Risk Interventions No flowsheet data found.

## 2020-03-26 NOTE — Progress Notes (Signed)
PROGRESS NOTE    Malik [name]   CHY:[id number]  DOB: 02/02/1946  PCP: [facility], [place], NP    DOA: 03/16/2020 LOS: 22   Brief Narrative   75 y.o. 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 He presented to our ED on 12/29 with fever/chills and fatigue and he tested positive for COVID-19.  Family and patient left prior to being seen by ED provider back then. Family reports more lethargy, progressive dyspnea, found to be hypoxic and was sent to ER on 03/16/20.     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  Sepsis physiology has improved.  Lactic acidosis resolved.  Chest x-ray on admission showed multifocal pneumonia, consistent with COVID-19.  Of note, his chest x-ray in the ED on 12/29 also showed pneumonia, however family took him home at that time. Oxygen requirement improving, down from 10 L/min >> 3 L/min. 1/13-14: Remains on 3 L/min supplemental oxygen 11/16: oxygen requirement up slightly to 5 L/min  Completed 5 days remdesivir -- Continue steroids, baricitinib -- Continue supplemental oxygen to maintain O2 sat above 88%, wean off as tolerated  Steroid-induced hyperglycemia -continue adjusting insulin for inpatient goal CBG 140-180 Type 2 diabetes - Started Lantus 10 units daily, Novolog 8 units TID WC + sliding scale   Elevated troponin  -likely demand ischemia in the setting of hypoxia.  No chest pain or acute ischemic changes on EKG.  Was initially on heparin drip, since discontinued.  Chronic diastolic CHF -appears clinically euvolemic.  Monitor volume status closely and consider Lasix.  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  Appears neurology recommendation in November was to continue Plavix, family's stated he is not taking this at home. --Outpatient neurology follow-up  Essential hypertension -chronic, stable.  Continue metoprolol, amlodipine  History of alcohol abuse -no recent heavy drinking per family  History of recent GI bleed -hospitalized for this following his stroke when he was on DAPT.  Currently not on antiplatelet therapy per family.   DVT prophylaxis: enoxaparin (LOVENOX) injection 40 mg Start: 03/17/20 1030   Diet:  Diet Orders (From admission, onward)    Start     Ordered   03/23/20 1210  DIET DYS 3 Room service appropriate? Yes with Assist; Fluid consistency: Thin  Diet effective now       Comments: CARB MODIFIED MINCED meats, w/ extra Gravy; potatoes too. Cream Soups. ENSURE TID meals.  Question Answer Comment  Room service appropriate? Yes with Assist   Fluid consistency: Thin      03/23/20 1210            Code Status: Full Code  Subjective 03/26/20    Patient sleeping comfortably.  Appears in no distress.  He is nonverbal, unable to illicit any complaints.  No acute events reported.   Disposition Plan & Communication   Status is: Inpatient  Remains inpatient appropriate because:Inpatient level of care appropriate due to severity of illness, weaning off oxygen.  Hypoglycemic episode within the past 24 hours warrants further close monitoring.  SNF placement  pending.   Dispo: The patient is from: Home              Anticipated d/c is to: SNF              Anticipated d/c date is: Pending SNF placement              Patient currently is not medically stable to d/c.    Family Communication: None at bedside, will attempt to call   Consults, Procedures, Significant Events   Consultants:   None  Procedures:   None  Antimicrobials:  Anti-infectives (From admission, onward)   Start     Dose/Rate Route Frequency Ordered Stop   03/17/20 1000  remdesivir 100 mg in sodium chloride 0.9 % 100 mL IVPB  Status:  Discontinued       "Followed by" Linked Group Details   100 mg 200 mL/hr over 30 Minutes Intravenous Daily 03/16/20 1405 03/16/20 1405   03/17/20 1000  remdesivir 100 mg in sodium chloride 0.9 % 100 mL IVPB       "Followed by" Linked Group Details   100 mg 200 mL/hr over 30 Minutes Intravenous Daily 03/16/20 1403 03/20/20 0917   03/16/20 1500  remdesivir 200 mg in sodium chloride 0.9% 250 mL IVPB       "Followed by" Linked Group Details   200 mg 580 mL/hr over 30 Minutes Intravenous Once 03/16/20 1403 03/16/20 1727   03/16/20 1345  remdesivir 200 mg in sodium chloride 0.9% 250 mL IVPB  Status:  Discontinued       "Followed by" Linked Group Details   200 mg 580 mL/hr over 30 Minutes Intravenous Once 03/16/20 1405 03/16/20 1405   03/16/20 1300  cefTRIAXone (ROCEPHIN) 2 g in sodium chloride 0.9 % 100 mL IVPB        2 g 200 mL/hr over 30 Minutes Intravenous  Once 03/16/20 1250 03/16/20 1403   03/16/20 1300  azithromycin (ZITHROMAX) 500 mg in sodium chloride 0.9 % 250 mL IVPB        500 mg 250 mL/hr over 60 Minutes Intravenous  Once 03/16/20 1250 03/16/20 1554        Objective   Vitals:   03/25/20 1210 03/25/20 1700 03/25/20 2002 03/26/20 0502  BP: (!) 151/77  (!) 156/86 (!) 180/92  Pulse: 77  72 72  Resp: 16  20 20   Temp: 97.7 F (36.5 C)  97.7 F (36.5 C) 98.1 F (36.7 C)  TempSrc: Oral  Oral   SpO2: (!) 88% 98% 95% 97%   Weight:      Height:        Intake/Output Summary (Last 24 hours) at 03/26/2020 1448 Last data filed at 03/26/2020 0601 Gross per 24 hour  Intake --  Output 1200 ml  Net -1200 ml   Filed Weights   03/21/20 0435 03/22/20 0340 03/23/20 0312  Weight: 87.1 kg 83.5 kg 83.6 kg    Physical Exam:  General exam: sleeping woke easily, no acute distress, nonverbal Respiratory system: CTAB, on 5 L/min nasal cannula oxygen, normal respiratory effort Cardiovascular  system: RRR, no pedal edema.   Gastrointestinal system: soft, nontender, hypoactive bowel sounds   Labs   Data Reviewed: I have personally reviewed following labs and imaging studies  CBC: Recent Labs  Lab 03/20/20 0456 03/21/20 0455 03/22/20 0550 03/23/20 0623 03/24/20 0433 03/25/20 0655 03/26/20 0620  WBC 12.3* 11.7* 12.8* 10.9* 15.9* 18.5* 20.0*  NEUTROABS 10.9* 9.0*  --   --   --   --   --   HGB 13.4 13.8 13.3 14.1 12.8* 13.0 13.2  HCT 40.8 41.1 40.5 42.8 38.8* 38.8* 40.3  MCV 78.9* 78.1* 78.0* 78.2* 79.7* 78.1* 79.3*  PLT 442* 515* 522* 496* 463* 399 [id number]   Basic Metabolic Panel: Recent Labs  Lab 03/20/20 0456 03/21/20 0455 03/22/20 0550 03/23/20 0623 03/24/20 0433 03/25/20 0655 03/26/20 0620  NA 141 144 145 145 146* 140 142  K 3.7 3.7 3.7 4.4 4.1 4.1 4.1  CL 102 107 110 109 111 107 107  CO2 23 23 25 24 26 25 25   GLUCOSE 271* 183* 189* 268* 66* 141* 80  BUN 20 23 31* 36* 33* 26* 22  CREATININE 1.00 0.95 1.01 1.14 1.10 0.86 0.91  CALCIUM 8.8* 9.0 8.7* 9.0 8.7* 8.7* 8.8*  MG 1.9 2.2  --   --   --   --  2.0  PHOS 3.5 3.2  --   --   --   --   --    GFR: Estimated Creatinine Clearance: 72.4 mL/min (by C-G formula based on SCr of 0.91 mg/dL). Liver Function Tests: Recent Labs  Lab 03/22/20 0550 03/23/20 0623 03/24/20 0433 03/25/20 0655 03/26/20 0620  AST 28 31 33 26 31  ALT 25 30 27 30 29   ALKPHOS 65 71 61 66 67  BILITOT 1.1 1.1 1.0 1.1 1.3*  PROT 6.7 7.0 6.4* 6.2* 6.7  ALBUMIN 2.9* 3.0* 2.7*  2.6* 2.7*   No results for input(s): LIPASE, AMYLASE in the last 168 hours. No results for input(s): AMMONIA in the last 168 hours. Coagulation Profile: No results for input(s): INR, PROTIME in the last 168 hours. Cardiac Enzymes: No results for input(s): CKTOTAL, CKMB, CKMBINDEX, TROPONINI in the last 168 hours. BNP (last 3 results) No results for input(s): PROBNP in the last 8760 hours. HbA1C: No results for input(s): HGBA1C in the last 72 hours. CBG: Recent Labs  Lab 03/25/20 0755 03/25/20 1202 03/25/20 1701 03/25/20 2005 03/26/20 1216  GLUCAP 130* 184* 184* 136* 105*   Lipid Profile: No results for input(s): CHOL, HDL, LDLCALC, TRIG, CHOLHDL, LDLDIRECT in the last 72 hours. Thyroid Function Tests: No results for input(s): TSH, T4TOTAL, FREET4, T3FREE, THYROIDAB in the last 72 hours. Anemia Panel: No results for input(s): VITAMINB12, FOLATE, FERRITIN, TIBC, IRON, RETICCTPCT in the last 72 hours. Sepsis Labs: No results for input(s): PROCALCITON, LATICACIDVEN in the last 168 hours.  Recent Results (from the past 240 hour(s))  SARS CORONAVIRUS 2 (TAT 6-24 HRS)     Status: Abnormal   Collection Time: 03/17/20 10:00 AM  Result Value Ref Range Status   SARS Coronavirus 2 POSITIVE (A) NEGATIVE Final    Comment: (NOTE) SARS-CoV-2 target nucleic acids are DETECTED.  The SARS-CoV-2 RNA is generally detectable in upper and lower respiratory specimens during the acute phase of infection. Positive results are indicative of the presence of SARS-CoV-2 RNA. Clinical correlation with patient history and other diagnostic information is  necessary to determine patient infection status. Positive results do not rule out bacterial infection or co-infection with other viruses.  The expected result is Negative.  Fact Sheet for Patients: SugarRoll.be  Fact Sheet for Healthcare Providers: https://www.woods-mathews.com/  This test is not yet  approved or cleared by the [place] FDA and  has been authorized for detection and/or diagnosis of SARS-CoV-2 by FDA under an Emergency Use Authorization (EUA). This EUA will remain  in effect (meaning this test can be used) for the duration of the COVID-19 declaration under Section 564(b)(1) of the Act, 21 U. S.C. section 360bbb-3(b)(1), unless the authorization is terminated or revoked sooner.   Performed at [facility], [place] [address] [place], [place] [address]       Imaging Studies   No results found.   Medications   Scheduled Meds: . amLODipine  10 mg Oral Daily  . atorvastatin  80 mg Oral Daily  . baricitinib  4 mg Oral Daily  . enoxaparin (LOVENOX) injection  40 mg Subcutaneous Q24H  . folic acid  1 mg Oral Daily  . insulin aspart  0-15 Units Subcutaneous TID WC  . insulin aspart  8 Units Subcutaneous TID WC  . insulin glargine  10 Units Subcutaneous Daily  . losartan  100 mg Oral Daily  . metoprolol tartrate  25 mg Oral BID  . pantoprazole  40 mg Oral Daily  . predniSONE  40 mg Oral Q breakfast  . thiamine  100 mg Oral Daily   Continuous Infusions:     LOS: 10 days    Time spent: 20 minutes    Ezekiel Slocumb, DO [facility]  03/26/2020, 2:48 PM    If 7PM-7AM, please contact night-coverage. How to contact the [facility] Attending or Consulting provider [place] or covering provider during after hours [place], for this patient?    1. Check the care team in [facility] and look for a) attending/consulting TRH provider listed and b) the [facility] team listed 2. Log into [email or web address] and use [place] universal password to access. If you do not have the password, please contact the hospital operator. 3. Locate the [facility] provider you are looking for under [facility] and page to a number that you can be directly reached. 4. If you still have difficulty reaching the provider, please page the [facility] (Director on Call) for the Hospitalists listed on  amion for assistance.

## 2020-03-26 NOTE — Plan of Care (Signed)
Continuing with plan of care. 

## 2020-03-27 ENCOUNTER — Inpatient Hospital Stay: Payer: Medicare Other

## 2020-03-27 LAB — CBC WITH DIFFERENTIAL/PLATELET
Abs Immature Granulocytes: 0.16 10*3/uL — ABNORMAL HIGH (ref 0.00–0.07)
Basophils Absolute: 0 10*3/uL (ref 0.0–0.1)
Basophils Relative: 0 %
Eosinophils Absolute: 0.2 10*3/uL (ref 0.0–0.5)
Eosinophils Relative: 1 %
HCT: 37.5 % — ABNORMAL LOW (ref 39.0–52.0)
Hemoglobin: 12.1 g/dL — ABNORMAL LOW (ref 13.0–17.0)
Immature Granulocytes: 1 %
Lymphocytes Relative: 10 %
Lymphs Abs: 1.9 10*3/uL (ref 0.7–4.0)
MCH: 25.9 pg — ABNORMAL LOW (ref 26.0–34.0)
MCHC: 32.3 g/dL (ref 30.0–36.0)
MCV: 80.3 fL (ref 80.0–100.0)
Monocytes Absolute: 1.4 10*3/uL — ABNORMAL HIGH (ref 0.1–1.0)
Monocytes Relative: 8 %
Neutro Abs: 14.5 10*3/uL — ABNORMAL HIGH (ref 1.7–7.7)
Neutrophils Relative %: 80 %
Platelets: 403 10*3/uL — ABNORMAL HIGH (ref 150–400)
RBC: 4.67 MIL/uL (ref 4.22–5.81)
RDW: 20 % — ABNORMAL HIGH (ref 11.5–15.5)
WBC: 18.1 10*3/uL — ABNORMAL HIGH (ref 4.0–10.5)
nRBC: 0.1 % (ref 0.0–0.2)

## 2020-03-27 LAB — GLUCOSE, CAPILLARY
Glucose-Capillary: 66 mg/dL — ABNORMAL LOW (ref 70–99)
Glucose-Capillary: 275 mg/dL — ABNORMAL HIGH (ref 70–99)
Glucose-Capillary: 207 mg/dL — ABNORMAL HIGH (ref 70–99)
Glucose-Capillary: 200 mg/dL — ABNORMAL HIGH (ref 70–99)

## 2020-03-27 LAB — FIBRIN DERIVATIVES D-DIMER (ARMC ONLY): Fibrin derivatives D-dimer (ARMC): 1714.3 ng{FEU}/mL — ABNORMAL HIGH (ref 0.00–499.00)

## 2020-03-27 LAB — C-REACTIVE PROTEIN: CRP: 1.4 mg/dL — ABNORMAL HIGH

## 2020-03-27 LAB — BRAIN NATRIURETIC PEPTIDE: B Natriuretic Peptide: 124.4 pg/mL — ABNORMAL HIGH (ref 0.0–100.0)

## 2020-03-27 LAB — PROCALCITONIN: Procalcitonin: <0.1 ng/mL

## 2020-03-27 MED ORDER — HYDROCHLOROTHIAZIDE 12.5 MG PO CAPS
12.5000 mg | ORAL_CAPSULE | Freq: Every day | ORAL | Status: DC
  Administered 2020-03-27 – 2020-03-30 (×4): 12.5 mg via ORAL
  Filled 2020-03-27 (×4): qty 1

## 2020-03-27 MED ORDER — SENNOSIDES-DOCUSATE SODIUM 8.6-50 MG PO TABS
1.0000 | ORAL_TABLET | Freq: Two times a day (BID) | ORAL | Status: DC
  Administered 2020-03-27 – 2020-03-30 (×4): 1 via ORAL
  Filled 2020-03-27 (×4): qty 1

## 2020-03-27 MED ORDER — IPRATROPIUM-ALBUTEROL 20-100 MCG/ACT IN AERS
1.0000 | INHALATION_SPRAY | Freq: Four times a day (QID) | RESPIRATORY_TRACT | Status: DC
  Administered 2020-03-27 – 2020-03-30 (×11): 1 via RESPIRATORY_TRACT
  Filled 2020-03-27: qty 4

## 2020-03-27 MED ORDER — BISACODYL 5 MG PO TBEC: 5.0000 mg | DELAYED_RELEASE_TABLET | Freq: Every day | ORAL | Status: DC | PRN

## 2020-03-27 MED ORDER — INSULIN GLARGINE 100 UNIT/ML ~~LOC~~ SOLN
5.0000 [IU] | Freq: Every day | SUBCUTANEOUS | Status: DC
  Administered 2020-03-28 – 2020-03-29 (×2): 5 [IU] via SUBCUTANEOUS
  Filled 2020-03-27 (×3): qty 0.05

## 2020-03-27 MED ORDER — DM-GUAIFENESIN ER 30-600 MG PO TB12
1.0000 | ORAL_TABLET | Freq: Two times a day (BID) | ORAL | Status: DC
  Administered 2020-03-27: 1 via ORAL
  Filled 2020-03-27 (×3): qty 1

## 2020-03-27 MED ORDER — BISACODYL 10 MG RE SUPP: 10.0000 mg | Freq: Every day | RECTAL | Status: DC | PRN

## 2020-03-27 NOTE — Progress Notes (Addendum)
PROGRESS NOTE    Malik [name]   WEX:[id number]  DOB: 02/16/1946  PCP: [facility], [place], NP    DOA: 03/16/2020 LOS: 54   Brief Narrative   75 y.o. 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 He presented to our ED on 12/29 with fever/chills and fatigue and he tested positive for COVID-19.  Family and patient left prior to being seen by ED provider back then. Family reports more lethargy, progressive dyspnea, found to be hypoxic and was sent to ER on 03/16/20.     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  Sepsis physiology has improved.  Lactic acidosis resolved.  Chest x-ray on admission showed multifocal pneumonia, consistent with COVID-19.  Of note, his chest x-ray in the ED on 12/29 also showed pneumonia, however family took him home at that time. Oxygen requirement improving, down from 10 L/min >> 3 L/min. 1/13-14: Remains on 3 L/min supplemental oxygen 1/16-17: oxygen requirement up slightly to 5 >> 6 L/min  1/17 -chest x-ray stable to improved, Pro-Cal still negative, BNP only minimally elevated and patient euvolemic to dry on exam.  Appears his hypoxia is related to COVID-pneumonia. Completed 5 days remdesivir -- obtain CTA chest to rule out PE if oxygenation worsens.  No tachycardia. -- Continue steroids, baricitinib -- Continue supplemental  oxygen to maintain O2 sat above 88%, wean off as tolerated -- Supportive care w/ Mucinex, Combivent -- IS and flutter  Steroid-induced hyperglycemia -continue adjusting insulin for inpatient goal CBG 140-180 Type 2 diabetes - Reduce Lantus 10>>5 units daily, Novolog 8 units TID WC + sliding scale  Hypoglycemia - Lantus reduced as above.  Hypoglycemia protocol.  Elevated troponin -likely demand ischemia in the setting of hypoxia.  No chest pain or acute ischemic changes on EKG.  Was initially on heparin drip, since discontinued.  Chronic diastolic CHF -appears clinically euvolemic.  Monitor volume status closely and consider Lasix.  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  Appears neurology recommendation in November was to continue Plavix, family's stated he is not taking this at home. --Outpatient neurology follow-up  Essential hypertension -chronic, stable.  Continue metoprolol, amlodipine, losartan.  Added HCTZ 12.5 mg as BP uncontrolled.  Monitor BP and electrolytes closely.  History of alcohol abuse -no recent heavy drinking per family  History of recent GI bleed -hospitalized for this following his stroke when he was on DAPT.  Currently not on antiplatelet therapy per family.   DVT prophylaxis: enoxaparin (LOVENOX) injection 40 mg Start: 03/17/20 1030   Diet:  Diet Orders (From admission, onward)    Start     Ordered   03/23/20 1210  DIET DYS 3 Room service appropriate? Yes with Assist; Fluid consistency: Thin  Diet effective now       Comments: CARB MODIFIED MINCED meats, w/ extra Gravy; potatoes too. Cream Soups. ENSURE TID meals.  Question Answer Comment  Room service appropriate? Yes with Assist   Fluid consistency: Thin      03/23/20 1210             Code Status: Full Code    Subjective 03/27/20    Patient sleeping when seen today, wakes easily.  Nonverbal as is his baseline.  No acute distress.  No acute events reported.  Pt unable to tell me any symptoms he's having.  Oxygen requirement up slightly over past couple days.   Disposition Plan & Communication   Status is: Inpatient  Remains inpatient appropriate because:Inpatient level of care appropriate due to severity of illness .  Patient has increasing oxygen requirement past couple of days in setting of Covid PNA, requires further evaluation and close monitoring.   Dispo: The patient is from: Home              Anticipated d/c is to: SNF              Anticipated d/c date is: 2-3 days              Patient currently is not medically stable to d/c.    Family Communication: Spoke with son by phone this afternoon 1/17   Consults, Procedures, Significant Events   Consultants:   None  Procedures:   None  Antimicrobials:  Anti-infectives (From admission, onward)   Start     Dose/Rate Route Frequency Ordered Stop   03/17/20 1000  remdesivir 100 mg in sodium chloride 0.9 % 100 mL IVPB  Status:  Discontinued       "Followed by" Linked Group Details   100 mg 200 mL/hr over 30 Minutes Intravenous Daily 03/16/20 1405 03/16/20 1405   03/17/20 1000  remdesivir 100 mg in sodium chloride 0.9 % 100 mL IVPB       "Followed by" Linked Group Details   100 mg 200 mL/hr over 30 Minutes Intravenous Daily 03/16/20 1403 03/20/20 0917   03/16/20 1500  remdesivir 200 mg in sodium chloride 0.9% 250 mL IVPB       "Followed by" Linked Group Details   200 mg 580 mL/hr over 30 Minutes Intravenous Once 03/16/20 1403 03/16/20 1727   03/16/20 1345  remdesivir 200 mg in sodium chloride 0.9% 250 mL IVPB  Status:  Discontinued       "Followed by" Linked Group Details   200 mg 580 mL/hr over 30 Minutes Intravenous Once 03/16/20 1405 03/16/20 1405   03/16/20 1300  cefTRIAXone (ROCEPHIN) 2 g  in sodium chloride 0.9 % 100 mL IVPB        2 g 200 mL/hr over 30 Minutes Intravenous  Once 03/16/20 1250 03/16/20 1403   03/16/20 1300  azithromycin (ZITHROMAX) 500 mg in sodium chloride 0.9 % 250 mL IVPB        500 mg 250 mL/hr over 60 Minutes Intravenous  Once 03/16/20 1250 03/16/20 1554        Objective   Vitals:   03/26/20 2023 03/26/20 2317 03/27/20 1005 03/27/20 1228  BP: (!) 152/79 (!) 164/86 (!) 150/70 (!) 157/75  Pulse: 67 (!) 55 73 (!) 57  Resp: 19 18 20 19   Temp: 97.7 F (36.5 C) 98.3 F (36.8 C) 98.3 F (36.8 C) 98.1 F (36.7  C)  TempSrc: Oral Oral Oral Oral  SpO2: 98% 97% 93% 96%  Weight:      Height:        Intake/Output Summary (Last 24 hours) at 03/27/2020 1502 Last data filed at 03/26/2020 2217 Gross per 24 hour  Intake --  Output 725 ml  Net -725 ml   Filed Weights   03/21/20 0435 03/22/20 0340 03/23/20 0312  Weight: 87.1 kg 83.5 kg 83.6 kg    Physical Exam:  General exam: sleeping comfortably, woke easily, no acute distress, nonverbal Respiratory system: CTAB, on 6 L/min nasal cannula oxygen, normal respiratory effort, no accessory muscle use Cardiovascular system: RRR, no pedal edema.   Gastrointestinal system: soft, nontender, +bowel sounds   Labs   Data Reviewed: I have personally reviewed following labs and imaging studies  CBC: Recent Labs  Lab 03/21/20 0455 03/22/20 0550 03/23/20 0623 03/24/20 0433 03/25/20 0655 03/26/20 0620 03/27/20 0852  WBC 11.7*   < > 10.9* 15.9* 18.5* 20.0* 18.1*  NEUTROABS 9.0*  --   --   --   --   --  14.5*  HGB 13.8   < > 14.1 12.8* 13.0 13.2 12.1*  HCT 41.1   < > 42.8 38.8* 38.8* 40.3 37.5*  MCV 78.1*   < > 78.2* 79.7* 78.1* 79.3* 80.3  PLT 515*   < > 496* 463* 399 397 403*   < > = values in this interval not displayed.   Basic Metabolic Panel: Recent Labs  Lab 03/21/20 0455 03/22/20 0550 03/23/20 0623 03/24/20 0433 03/25/20 0655 03/26/20 0620  NA 144 145 145 146* 140 142  K 3.7 3.7 4.4  4.1 4.1 4.1  CL 107 110 109 111 107 107  CO2 23 25 24 26 25 25   GLUCOSE 183* 189* 268* 66* 141* 80  BUN 23 31* 36* 33* 26* 22  CREATININE 0.95 1.01 1.14 1.10 0.86 0.91  CALCIUM 9.0 8.7* 9.0 8.7* 8.7* 8.8*  MG 2.2  --   --   --   --  2.0  PHOS 3.2  --   --   --   --   --    GFR: Estimated Creatinine Clearance: 72.4 mL/min (by C-G formula based on SCr of 0.91 mg/dL). Liver Function Tests: Recent Labs  Lab 03/22/20 0550 03/23/20 0623 03/24/20 0433 03/25/20 0655 03/26/20 0620  AST 28 31 33 26 31  ALT 25 30 27 30 29   ALKPHOS 65 71 61 66 67  BILITOT 1.1 1.1 1.0 1.1 1.3*  PROT 6.7 7.0 6.4* 6.2* 6.7  ALBUMIN 2.9* 3.0* 2.7* 2.6* 2.7*   No results for input(s): LIPASE, AMYLASE in the last 168 hours. No results for input(s): AMMONIA in the last 168 hours. Coagulation Profile: No results for input(s): INR, PROTIME in the last 168 hours. Cardiac Enzymes: No results for input(s): CKTOTAL, CKMB, CKMBINDEX, TROPONINI in the last 168 hours. BNP (last 3 results) No results for input(s): PROBNP in the last 8760 hours. HbA1C: No results for input(s): HGBA1C in the last 72 hours. CBG: Recent Labs  Lab 03/26/20 1216 03/26/20 1720 03/26/20 2216 03/27/20 0827 03/27/20 1225  GLUCAP 105* 159* 203* 66* 275*   Lipid Profile: No results for input(s): CHOL, HDL, LDLCALC, TRIG, CHOLHDL, LDLDIRECT in the last 72 hours. Thyroid Function Tests: No results for input(s): TSH, T4TOTAL, FREET4, T3FREE, THYROIDAB in the last 72 hours. Anemia Panel: No results for input(s): VITAMINB12, FOLATE, FERRITIN, TIBC, IRON, RETICCTPCT in the last 72 hours. Sepsis Labs: Recent Labs  Lab 03/27/20 0852  PROCALCITON <0.10    No results found for this or any previous visit (from the past 240 hour(s)).    Imaging Studies   DG Chest Port 1 View  Result Date: 03/27/2020 CLINICAL DATA:  Hypoxia.  COVID-19 positive. EXAM: PORTABLE CHEST 1 VIEW COMPARISON:  March 16, 2020. FINDINGS: The heart size and  mediastinal contours are within normal limits. No pneumothorax or pleural effusion is noted. Stable bilateral lung opacities are noted, right greater than left, concerning for multifocal pneumonia. Stable old bilateral rib fractures are noted. IMPRESSION: Stable bilateral lung opacities are noted, right greater than left, concerning for multifocal pneumonia. Electronically Signed   By: Marijo Conception M.D.   On: 03/27/2020 09:30     Medications   Scheduled Meds: . amLODipine  10 mg Oral Daily  . atorvastatin  80 mg Oral Daily  . baricitinib  4 mg Oral Daily  . enoxaparin (LOVENOX) injection  40 mg Subcutaneous Q24H  . folic acid  1 mg Oral Daily  . insulin aspart  0-15 Units Subcutaneous TID WC  . insulin aspart  8 Units Subcutaneous TID WC  . [START ON 03/28/2020] insulin glargine  5 Units Subcutaneous Daily  . losartan  100 mg Oral Daily  . metoprolol tartrate  25 mg Oral BID  . pantoprazole  40 mg Oral Daily  . predniSONE  40 mg Oral Q breakfast  . senna-docusate  1 tablet Oral BID  . thiamine  100 mg Oral Daily   Continuous Infusions:     LOS: 11 days    Time spent: 30 minutes with > 50% spent in coordination of care and at bedside    Ezekiel Slocumb, DO [facility]  03/27/2020, 3:02 PM    If 7PM-7AM, please contact night-coverage. How to contact the [facility] Attending or Consulting provider [place] or covering provider during after hours [place], for this patient?    1. Check the care team in [facility] and look for a) attending/consulting TRH provider listed and b) the [facility] team listed 2. Log into [email or web address] and use [place]'s universal password to access. If you do not have the password, please contact the hospital operator. 3. Locate the [facility] provider you are looking for under [facility] and page to a number that you can be directly reached. 4. If you still have difficulty reaching the provider, please page the [facility] (Director on Call) for the Hospitalists  listed on amion for assistance.

## 2020-03-27 NOTE — Progress Notes (Signed)
Inpatient Diabetes Program Recommendations  AACE/ADA: New Consensus Statement on Inpatient Glycemic Control   Target Ranges:  Prepandial:   less than 140 mg/dL      Peak postprandial:   less than 180 mg/dL (1-2 hours)      Critically ill patients:  140 - 180 mg/dL   Results for KINTA, MARTIS (MRN [id number]) as of 03/27/2020 13:38  Ref. Range 03/26/2020 6:20 03/26/2020 12:16 03/26/2020 17:20 03/26/2020 22:16 03/27/2020 08:27 03/27/2020 12:25  Glucose-Capillary Latest Ref Range: 70 - 99 mg/dL 80  105 (H) 159 (H) 203 (H) 66 (L) 275 (H)   Review of Glycemic Control  Current orders for Inpatient glycemic control: Lantus 10 units daily, Novolog 8 units TID with meals, Novolog 0-15 units TID with meals; Prednisone 40 mg daily  Inpatient Diabetes Program Recommendations:    Insulin: Fasting glucose 66 mg/dl today.  Please consider decreasing Lantus to 5 units daily.  Thanks, Barnie Alderman, RN, MSN, CDE Diabetes Coordinator Inpatient Diabetes Program [phone] (Team Pager from 8am to 5pm)

## 2020-03-27 NOTE — Progress Notes (Signed)
Physical Therapy Treatment Patient Details Name: Malik [name] MRN: [id number] DOB: 08-Oct-1945 Today's Date: 03/27/2020    History of Present Illness 75 y.o. 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 He presented to our ED on 12/29 with fever/chills and fatigue and he tested positive for COVID-19.  He left AMA prior to being seen by ED provider. Family reports more lethargy, found to be hypoxic and was sent back to the ER.  MD assessment includes: Acute Hypoxic Respiratory Failure due to Covid-19 viral illness, severe sepsis, elevated troponin likely demand ischemia, and recent GI bleed.    PT Comments    Patient is in bed upon PT arrival and agreeable to participate in therapy session. Pulse ox applied to finger to monitor Sp02 levels with exercise as patient is currently on 6L of oxygen via nasal cannula. Upon pulling bedding down found to have male external catheter not in place and laying at bottom of bed. Attempted to re-apply (CNA's notified after session end). Patient transitioned to EOB only requiring assistance for RLE and to find midline. Once midline was found patient tolerated seated position well with decreased need for assistance. Supine and seated strengthening interventions required visual cue and tactile cue for performance. Sit to stand transfers performed with quad cane in LUE and decreased need for assistance with repetition. Prolonged standing holds for each session tolerated until last one as patient fatigued. Patient returned to bed with needs met. Patient's current plan of care remains appropriate.    Follow Up Recommendations  SNF     Equipment Recommendations  None recommended by PT    Recommendations for Other Services       Precautions / Restrictions  Precautions Precautions: Fall Restrictions Weight Bearing Restrictions: No    Mobility  Bed Mobility Overal bed mobility: Needs Assistance Bed Mobility: Sit to Supine;Supine to Sit     Supine to sit: Min assist Sit to supine: Mod assist   General bed mobility comments: Patient requires assistance for RLE as well as positioning in midline  Transfers Overall transfer level: Needs assistance Equipment used: Quad cane Transfers: Sit to/from Stand Sit to Stand: Mod assist Stand pivot transfers: Mod assist       General transfer comment: able to utilize quad cane safely, requires blocking of R foot to decrease slide  Ambulation/Gait             General Gait Details: unsafe to perform at this time.   Stairs             Wheelchair Mobility    Modified Rankin (Stroke Patients Only)       Balance Overall balance assessment: Needs assistance Sitting-balance support: Feet supported Sitting balance-Leahy Scale: Good Sitting balance - Comments: able to sit EOB once assisted into position without support, requires only CGA/close supervision. Postural control: Left lateral lean Standing balance support: Single extremity supported Standing balance-Leahy Scale: Poor Standing balance comment: Requires modA to initiate transfer, able to maintain with CGA for short durations                            Cognition Arousal/Alertness: Lethargic Behavior During Therapy: WFL for tasks assessed/performed;Flat affect Overall Cognitive Status: No family/caregiver present to determine baseline cognitive functioning  General Comments: fatigued but able to follow simple commands      Exercises General Exercises - Lower Extremity Ankle Circles/Pumps: AROM;Strengthening;Both;10 reps;Seated Long Arc Quad: AAROM;Strengthening;Both;10 reps;Seated Hip ABduction/ADduction: AAROM;Strengthening;Both;10 reps;Seated Hip  Flexion/Marching: AAROM;Strengthening;Both;10 reps;Seated Other Exercises Other Exercises: sit to stand transfer x 5 with increased standing tolerance each repetition with decreased need for assistance; seated EOB stabilization Other Exercises: LBD, sup<>sit, sit<>stand x5, sitting/standing balance/toelrance, SPT    General Comments General comments (skin integrity, edema, etc.): Sp02 95% after standing interventions      Pertinent Vitals/Pain Pain Assessment: No/denies pain    Home Living                      Prior Function            PT Goals (current goals can now be found in the care plan section) Acute Rehab PT Goals Patient Stated Goal: to use bathroom Progress towards PT goals: Progressing toward goals    Frequency    Min 2X/week      PT Plan Current plan remains appropriate    Co-evaluation              AM-PAC PT "6 Clicks" Mobility   Outcome Measure  Help needed turning from your back to your side while in a flat bed without using bedrails?: A Little Help needed moving from lying on your back to sitting on the side of a flat bed without using bedrails?: A Little Help needed moving to and from a bed to a chair (including a wheelchair)?: A Little Help needed standing up from a chair using your arms (e.g., wheelchair or bedside chair)?: A Lot Help needed to walk in hospital room?: A Lot Help needed climbing 3-5 steps with a railing? : Total 6 Click Score: 14    End of Session Equipment Utilized During Treatment: Oxygen;Gait belt (6 L 02 via nasal cannula) Activity Tolerance: Patient tolerated treatment well Patient left: with call bell/phone within reach;in bed;with bed alarm set Nurse Communication: Mobility status PT Visit Diagnosis: Unsteadiness on feet (R26.81);Other abnormalities of gait and mobility (R26.89);Muscle weakness (generalized) (M62.81)     Time: 1164-3539 PT Time Calculation (min) (ACUTE ONLY): 24 min  Charges:   $Therapeutic Exercise: 8-22 mins $Therapeutic Activity: 8-22 mins           Janna Arch, PT, DPT            03/27/2020, 3:56 PM

## 2020-03-27 NOTE — Progress Notes (Signed)
Occupational Therapy Treatment Patient Details Name: Malik [name] MRN: [id number] DOB: Jun 29, 1945 Today's Date: 03/27/2020    History of present illness 75 y.o. 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 He presented to our ED on 12/29 with fever/chills and fatigue and he tested positive for COVID-19.  He left AMA prior to being seen by ED provider. Family reports more lethargy, found to be hypoxic and was sent back to the ER.  MD assessment includes: Acute Hypoxic Respiratory Failure due to Covid-19 viral illness, severe sepsis, elevated troponin likely demand ischemia, and recent GI bleed.   OT comments  Mr Snelgrove was seen for OT treatment on this date. Upon arrival to room pt reclined in bed agreeable to session. Pt requires MAX A for LBD seated EOB. MOD A + HHA sit<>stand x5 - pt beneifts from counting down and of note Quad Kasandra Knudsen is not in new room following pt transfer to this room - will bring next session. Pt making good progress toward goals. Pt continues to benefit from skilled OT services to maximize return to PLOF and minimize risk of future falls, injury, caregiver burden, and readmission. Will continue to follow POC. Frequency increased as pt demonstrated improved motivation and ability to follow commands. Discharge recommendation remains appropriate.    Follow Up Recommendations  SNF    Equipment Recommendations  Other (comment) (TBD)    Recommendations for Other Services      Precautions / Restrictions Precautions Precautions: Fall Restrictions Weight Bearing Restrictions: No       Mobility Bed Mobility Overal bed mobility: Needs Assistance Bed Mobility: Sit to Supine;Supine to Sit     Supine to sit: Min assist Sit to supine: Mod assist       Transfers Overall transfer level: Needs assistance Equipment used: 1 person hand held assist Transfers: Sit to/from [employer] Transfers Sit to Stand: Mod assist Stand pivot transfers: Mod assist       General transfer comment: does well with counting    Balance Overall balance assessment: Needs assistance Sitting-balance support: Feet supported Sitting balance-Leahy Scale: Good     Standing balance support: Single extremity supported Standing balance-Leahy Scale: Poor                             ADL either performed or assessed with clinical judgement   ADL Overall ADL's : Needs assistance/impaired                                       General ADL Comments: MAX A for LBD seated EOB. MOD A + HHA simulated BSC t/f.               Cognition Arousal/Alertness: Awake/alert Behavior During Therapy: WFL for tasks assessed/performed;Flat affect Overall Cognitive Status: No family/caregiver present to determine baseline cognitive functioning                                 General Comments: Pt followed all one step commands and answered all yes/no questions appropriately.        Exercises Exercises: Other exercises Other Exercises Other Exercises: Pt educated re:  HEP, importance of mobility for funcitonal strengthening Other Exercises: LBD, sup<>sit, sit<>stand x5, sitting/standing balance/toelrance, SPT           Pertinent Vitals/ Pain       Pain Assessment: No/denies pain         Frequency  Min 2X/week        Progress Toward Goals  OT Goals(current goals can now be found in the care plan section)  Progress towards OT goals: Progressing toward goals  Acute Rehab OT Goals Patient Stated Goal: to use bathroom OT Goal Formulation: With patient Time For Goal Achievement: 04/05/20 Potential to Achieve Goals: Good ADL Goals Pt Will Perform Eating: with set-up;with supervision;bed level Pt Will Perform  Upper Body Dressing: with min guard assist;sitting Pt Will Transfer to Toilet: with supervision;stand pivot transfer;bedside commode  Plan Discharge plan remains appropriate;Frequency needs to be updated       AM-PAC OT "6 Clicks" Daily Activity     Outcome Measure   Help from another person eating meals?: A Little Help from another person taking care of personal grooming?: A Little Help from another person toileting, which includes using toliet, bedpan, or urinal?: A Lot Help from another person bathing (including washing, rinsing, drying)?: A Lot Help from another person to put on and taking off regular upper body clothing?: A Little Help from another person to put on and taking off regular lower body clothing?: A Lot 6 Click Score: 15    End of Session    OT Visit Diagnosis: Other abnormalities of gait and mobility (R26.89);Muscle weakness (generalized) (M62.81)   Activity Tolerance Patient tolerated treatment well   Patient Left in bed;with call bell/phone within reach;with bed alarm set   Nurse Communication          Time: 1123-1140 OT Time Calculation (min): 17 min  Charges: OT General Charges $OT Visit: 1 Visit OT Treatments $Self Care/Home Management : 8-22 mins  Dessie Coma, M.S. OTR/L  03/27/20, 3:16 PM  ascom [phone]

## 2020-03-28 LAB — CBC
HCT: 39.5 % (ref 39.0–52.0)
Hemoglobin: 12.9 g/dL — ABNORMAL LOW (ref 13.0–17.0)
MCH: 26.4 pg (ref 26.0–34.0)
MCHC: 32.7 g/dL (ref 30.0–36.0)
MCV: 80.8 fL (ref 80.0–100.0)
Platelets: 415 10*3/uL — ABNORMAL HIGH (ref 150–400)
RBC: 4.89 MIL/uL (ref 4.22–5.81)
RDW: 21.2 % — ABNORMAL HIGH (ref 11.5–15.5)
WBC: 17.4 10*3/uL — ABNORMAL HIGH (ref 4.0–10.5)
nRBC: 0 % (ref 0.0–0.2)

## 2020-03-28 LAB — C-REACTIVE PROTEIN: CRP: 1.7 mg/dL — ABNORMAL HIGH (ref <1.0)

## 2020-03-28 LAB — GLUCOSE, CAPILLARY
Glucose-Capillary: 145 mg/dL — ABNORMAL HIGH (ref 70–99)
Glucose-Capillary: 201 mg/dL — ABNORMAL HIGH (ref 70–99)
Glucose-Capillary: 134 mg/dL — ABNORMAL HIGH (ref 70–99)
Glucose-Capillary: 198 mg/dL — ABNORMAL HIGH (ref 70–99)

## 2020-03-28 LAB — FIBRIN DERIVATIVES D-DIMER (ARMC ONLY): Fibrin derivatives D-dimer (ARMC): 1598.14 ng/mL (FEU) — ABNORMAL HIGH (ref 0.00–499.00)

## 2020-03-28 NOTE — Progress Notes (Signed)
PROGRESS NOTE    Malik [name]   FXT:[id number]  DOB: [birth year]  PCP: [facility], [place], NP    DOA: 03/16/2020 LOS: 71   Brief Narrative   75 y.o. 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 He presented to our ED on 12/29 with fever/chills and fatigue and he tested positive for COVID-19.  Family and patient left prior to being seen by ED provider back then. Family reports more lethargy, progressive dyspnea, found to be hypoxic and was sent to ER on 03/16/20.     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  Sepsis physiology has improved.  Lactic acidosis resolved.  Chest x-ray on admission showed multifocal pneumonia, consistent with COVID-19.  Of note, his chest x-ray in the ED on 12/29 also showed pneumonia, however family took him home at that time. Oxygen requirement improving, down from 10 L/min >> 3 L/min. 1/13-14: on 3 L/min supplemental oxygen 1/16-17: oxygen requirement up slightly to 5 >> 6 L/min  1/17 -chest x-ray stable to improved, Pro-Cal still negative, BNP only minimally elevated and patient euvolemic to dry on exam. 1/18 - remains stable on 6 L/min O2   Completed 5 days remdesivir -- obtain CTA chest to rule out PE if oxygenation worsens.  No tachycardia, low suspicion at this time. -- Continue steroids, baricitinib -- Continue  supplemental oxygen to maintain O2 sat above 88%, wean off as tolerated -- Supportive care w/ Mucinex, Combivent -- IS and flutter  Steroid-induced hyperglycemia -continue adjusting insulin for inpatient goal CBG 140-180 Type 2 diabetes - Reduce Lantus 10>>5 units daily, Novolog 8 units TID WC + sliding scale  Hypoglycemia - Lantus reduced as above.  Hypoglycemia protocol.  Elevated troponin -likely demand ischemia in the setting of hypoxia.  No chest pain or acute ischemic changes on EKG.  Was initially on heparin drip, since discontinued.  Chronic diastolic CHF -appears clinically euvolemic.  Monitor volume status closely and consider Lasix.  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  Appears neurology recommendation in November was to continue Plavix, family's stated he is not taking this at home. --Outpatient neurology follow-up  Essential hypertension -chronic, stable.  Continue metoprolol, amlodipine, losartan.  Added HCTZ 12.5 mg as BP uncontrolled.  Monitor BP and electrolytes closely.  History of alcohol abuse -no recent heavy drinking per family  History of recent GI bleed -hospitalized for this following his stroke when he was on DAPT.  Currently not on antiplatelet therapy per family.   DVT prophylaxis: enoxaparin (LOVENOX) injection 40 mg Start: 03/17/20 1030   Diet:  Diet Orders (From admission, onward)    Start  Ordered   03/23/20 1210  DIET DYS 3 Room service appropriate? Yes with Assist; Fluid consistency: Thin  Diet effective now       Comments: CARB MODIFIED MINCED meats, w/ extra Gravy; potatoes too. Cream Soups. ENSURE TID meals.  Question Answer Comment  Room service appropriate? Yes with Assist   Fluid consistency: Thin      03/23/20  1210            Code Status: Full Code    Subjective 03/28/20    Patient seen today at bedside. No acute events reported. He is nonverbal as usual. No signs of distress or discomfort. Nods yes to every question asked.   Disposition Plan & Communication   Status is: Inpatient  Remains inpatient appropriate because:Inpatient level of care appropriate due to severity of illness . Patient continues to have requirement for supplemental oxygen in setting of Covid PNA, requires further evaluation and close monitoring, weaning from oxygen.   Dispo: The patient is from: Home              Anticipated d/c is to: SNF              Anticipated d/c date is: 2-3 days              Patient currently is not medically stable to d/c.    Family Communication: Spoke with son by phone afternoon 1/17   Consults, Procedures, Significant Events   Consultants:   None  Procedures:   None  Antimicrobials:  Anti-infectives (From admission, onward)   Start     Dose/Rate Route Frequency Ordered Stop   03/17/20 1000  remdesivir 100 mg in sodium chloride 0.9 % 100 mL IVPB  Status:  Discontinued       "Followed by" Linked Group Details   100 mg 200 mL/hr over 30 Minutes Intravenous Daily 03/16/20 1405 03/16/20 1405   03/17/20 1000  remdesivir 100 mg in sodium chloride 0.9 % 100 mL IVPB       "Followed by" Linked Group Details   100 mg 200 mL/hr over 30 Minutes Intravenous Daily 03/16/20 1403 03/20/20 0917   03/16/20 1500  remdesivir 200 mg in sodium chloride 0.9% 250 mL IVPB       "Followed by" Linked Group Details   200 mg 580 mL/hr over 30 Minutes Intravenous Once 03/16/20 1403 03/16/20 1727   03/16/20 1345  remdesivir 200 mg in sodium chloride 0.9% 250 mL IVPB  Status:  Discontinued       "Followed by" Linked Group Details   200 mg 580 mL/hr over 30 Minutes Intravenous Once 03/16/20 1405 03/16/20 1405   03/16/20 1300  cefTRIAXone (ROCEPHIN) 2 g in sodium chloride 0.9 % 100 mL IVPB        2  g 200 mL/hr over 30 Minutes Intravenous  Once 03/16/20 1250 03/16/20 1403   03/16/20 1300  azithromycin (ZITHROMAX) 500 mg in sodium chloride 0.9 % 250 mL IVPB        500 mg 250 mL/hr over 60 Minutes Intravenous  Once 03/16/20 1250 03/16/20 1554        Objective   Vitals:   03/27/20 2058 03/28/20 0445 03/28/20 0815 03/28/20 1138  BP: (!) 142/71 (!) 160/83 (!) 155/91 137/66  Pulse: 70 (!) 57 72 69  Resp: 16 16 18    Temp: 98.2 F (36.8 C) 97.9 F (36.6 C) 97.6 F (36.4 C) 97.7 F (36.5 C)  TempSrc: Oral Oral Oral   SpO2: 97% 96%  98% 95%  Weight:      Height:        Intake/Output Summary (Last 24 hours) at 03/28/2020 1650 Last data filed at 03/28/2020 0500 Gross per 24 hour  Intake -  Output 350 ml  Net -350 ml   Filed Weights   03/21/20 0435 03/22/20 0340 03/23/20 0312  Weight: 87.1 kg 83.5 kg 83.6 kg    Physical Exam:  General exam: Resting in bed awake, no acute distress, nonverbal Respiratory system: Right-sided rhonchi, left side clear, on 6 L/min nasal cannula oxygen, normal respiratory effort at rest, no accessory muscle use Cardiovascular system: RRR, no pedal edema.   Gastrointestinal system: soft, nontender, +bowel sounds   Labs   Data Reviewed: I have personally reviewed following labs and imaging studies  CBC: Recent Labs  Lab 03/24/20 0433 03/25/20 0655 03/26/20 0620 03/27/20 0852 03/28/20 1436  WBC 15.9* 18.5* 20.0* 18.1* 17.4*  NEUTROABS  --   --   --  14.5*  --   HGB 12.8* 13.0 13.2 12.1* 12.9*  HCT 38.8* 38.8* 40.3 37.5* 39.5  MCV 79.7* 78.1* 79.3* 80.3 80.8  PLT 463* 399 397 403* [id number]*   Basic Metabolic Panel: Recent Labs  Lab 03/22/20 0550 03/23/20 0623 03/24/20 0433 03/25/20 0655 03/26/20 0620  NA 145 145 146* 140 142  K 3.7 4.4 4.1 4.1 4.1  CL 110 109 111 107 107  CO2 25 24 26 25 25   GLUCOSE 189* 268* 66* 141* 80  BUN 31* 36* 33* 26* 22  CREATININE 1.01 1.14 1.10 0.86 0.91  CALCIUM 8.7* 9.0 8.7* 8.7* 8.8*  MG  --   --    --   --  2.0   GFR: Estimated Creatinine Clearance: 72.4 mL/min (by C-G formula based on SCr of 0.91 mg/dL). Liver Function Tests: Recent Labs  Lab 03/22/20 0550 03/23/20 0623 03/24/20 0433 03/25/20 0655 03/26/20 0620  AST 28 31 33 26 31  ALT 25 30 27 30 29   ALKPHOS 65 71 61 66 67  BILITOT 1.1 1.1 1.0 1.1 1.3*  PROT 6.7 7.0 6.4* 6.2* 6.7  ALBUMIN 2.9* 3.0* 2.7* 2.6* 2.7*   No results for input(s): LIPASE, AMYLASE in the last 168 hours. No results for input(s): AMMONIA in the last 168 hours. Coagulation Profile: No results for input(s): INR, PROTIME in the last 168 hours. Cardiac Enzymes: No results for input(s): CKTOTAL, CKMB, CKMBINDEX, TROPONINI in the last 168 hours. BNP (last 3 results) No results for input(s): PROBNP in the last 8760 hours. HbA1C: No results for input(s): HGBA1C in the last 72 hours. CBG: Recent Labs  Lab 03/27/20 1225 03/27/20 1654 03/27/20 2207 03/28/20 0830 03/28/20 1137  GLUCAP 275* 207* 200* 145* 201*   Lipid Profile: No results for input(s): CHOL, HDL, LDLCALC, TRIG, CHOLHDL, LDLDIRECT in the last 72 hours. Thyroid Function Tests: No results for input(s): TSH, T4TOTAL, FREET4, T3FREE, THYROIDAB in the last 72 hours. Anemia Panel: No results for input(s): VITAMINB12, FOLATE, FERRITIN, TIBC, IRON, RETICCTPCT in the last 72 hours. Sepsis Labs: Recent Labs  Lab 03/27/20 0852  PROCALCITON <0.10    No results found for this or any previous visit (from the past 240 hour(s)).    Imaging Studies   DG Chest Port 1 View  Result Date: 03/27/2020 CLINICAL DATA:  Hypoxia.  COVID-19 positive. EXAM: PORTABLE CHEST 1 VIEW COMPARISON:  March 16, 2020. FINDINGS: The heart size and mediastinal contours are within normal limits. No pneumothorax or pleural effusion is noted. Stable bilateral lung  opacities are noted, right greater than left, concerning for multifocal pneumonia. Stable old bilateral rib fractures are noted. IMPRESSION: Stable bilateral  lung opacities are noted, right greater than left, concerning for multifocal pneumonia. Electronically Signed   By: Marijo Conception M.D.   On: 03/27/2020 09:30     Medications   Scheduled Meds: . amLODipine  10 mg Oral Daily  . atorvastatin  80 mg Oral Daily  . baricitinib  4 mg Oral Daily  . dextromethorphan-guaiFENesin  1 tablet Oral BID  . enoxaparin (LOVENOX) injection  40 mg Subcutaneous Q24H  . folic acid  1 mg Oral Daily  . hydrochlorothiazide  12.5 mg Oral Daily  . insulin aspart  0-15 Units Subcutaneous TID WC  . insulin aspart  8 Units Subcutaneous TID WC  . insulin glargine  5 Units Subcutaneous Daily  . Ipratropium-Albuterol  1 puff Inhalation Q6H WA  . losartan  100 mg Oral Daily  . metoprolol tartrate  25 mg Oral BID  . pantoprazole  40 mg Oral Daily  . predniSONE  40 mg Oral Q breakfast  . senna-docusate  1 tablet Oral BID  . thiamine  100 mg Oral Daily   Continuous Infusions:     LOS: 12 days    Time spent: 25 minutes with greater than 50% spent at bedside in coordination of care    Ezekiel Slocumb, DO [facility]  03/28/2020, 4:50 PM    If 7PM-7AM, please contact night-coverage. How to contact the [facility] Attending or Consulting provider [place] or covering provider during after hours [place], for this patient?    1. Check the care team in [facility] and look for a) attending/consulting [facility] provider listed and b) the [facility] team listed 2. Log into [email or web address] and use [place]'s universal password to access. If you do not have the password, please contact the hospital operator. 3. Locate the [facility] provider you are looking for under [facility] and page to a number that you can be directly reached. 4. If you still have difficulty reaching the provider, please page the [facility] (Director on Call) for the Hospitalists listed on amion for assistance.

## 2020-03-29 LAB — GLUCOSE, CAPILLARY
Glucose-Capillary: 70 mg/dL (ref 70–99)
Glucose-Capillary: 235 mg/dL — ABNORMAL HIGH (ref 70–99)
Glucose-Capillary: 210 mg/dL — ABNORMAL HIGH (ref 70–99)
Glucose-Capillary: 157 mg/dL — ABNORMAL HIGH (ref 70–99)

## 2020-03-29 LAB — HEPATIC FUNCTION PANEL
ALT: 25 U/L (ref 0–44)
AST: 25 U/L (ref 15–41)
Albumin: 2.7 g/dL — ABNORMAL LOW (ref 3.5–5.0)
Alkaline Phosphatase: 71 U/L (ref 38–126)
Bilirubin, Direct: 0.2 mg/dL (ref 0.0–0.2)
Indirect Bilirubin: 0.9 mg/dL (ref 0.3–0.9)
Total Bilirubin: 1.1 mg/dL (ref 0.3–1.2)
Total Protein: 6.4 g/dL — ABNORMAL LOW (ref 6.5–8.1)

## 2020-03-29 LAB — CBC WITH DIFFERENTIAL/PLATELET
Abs Immature Granulocytes: 0.12 10*3/uL — ABNORMAL HIGH (ref 0.00–0.07)
Basophils Absolute: 0 10*3/uL (ref 0.0–0.1)
Basophils Relative: 0 %
Eosinophils Absolute: 0 10*3/uL (ref 0.0–0.5)
Eosinophils Relative: 0 %
HCT: 38 % — ABNORMAL LOW (ref 39.0–52.0)
Hemoglobin: 12.7 g/dL — ABNORMAL LOW (ref 13.0–17.0)
Immature Granulocytes: 1 %
Lymphocytes Relative: 13 %
Lymphs Abs: 2.3 10*3/uL (ref 0.7–4.0)
MCH: 26.6 pg (ref 26.0–34.0)
MCHC: 33.4 g/dL (ref 30.0–36.0)
MCV: 79.5 fL — ABNORMAL LOW (ref 80.0–100.0)
Monocytes Absolute: 1.3 10*3/uL — ABNORMAL HIGH (ref 0.1–1.0)
Monocytes Relative: 8 %
Neutro Abs: 13.6 10*3/uL — ABNORMAL HIGH (ref 1.7–7.7)
Neutrophils Relative %: 78 %
Platelets: 409 10*3/uL — ABNORMAL HIGH (ref 150–400)
RBC: 4.78 MIL/uL (ref 4.22–5.81)
RDW: 21 % — ABNORMAL HIGH (ref 11.5–15.5)
WBC: 17.4 10*3/uL — ABNORMAL HIGH (ref 4.0–10.5)
nRBC: 0 % (ref 0.0–0.2)

## 2020-03-29 LAB — FIBRIN DERIVATIVES D-DIMER (ARMC ONLY): Fibrin derivatives D-dimer (ARMC): 1272.29 ng/mL (FEU) — ABNORMAL HIGH (ref 0.00–499.00)

## 2020-03-29 LAB — C-REACTIVE PROTEIN: CRP: 1.4 mg/dL — ABNORMAL HIGH (ref <1.0)

## 2020-03-29 MED ORDER — GUAIFENESIN-DM 100-10 MG/5ML PO SYRP
30.0000 mL | ORAL_SOLUTION | Freq: Two times a day (BID) | ORAL | Status: DC
  Administered 2020-03-29 – 2020-03-30 (×3): 30 mL via ORAL
  Filled 2020-03-29 (×8): qty 30

## 2020-03-29 NOTE — Progress Notes (Signed)
Physical Therapy Treatment Patient Details Name: Malik [name] MRN: [id number] DOB: 1946-01-07 Today's Date: 03/29/2020    History of Present Illness 75 y.o. 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 He presented to our ED on 12/29 with fever/chills and fatigue and he tested positive for COVID-19.  He left AMA prior to being seen by ED provider. Family reports more lethargy, found to be hypoxic and was sent back to the ER.  MD assessment includes: Acute Hypoxic Respiratory Failure due to Covid-19 viral illness, severe sepsis, elevated troponin likely demand ischemia, and recent GI bleed.    PT Comments    Pt is making limited progress towards goals with increased lethargy this date. Able to perform there-ex and seated balance at EOB. Was able to lateral scooting with min assist prior to getting back into bed. Will continue to progress. O2 sats stable throughout with exertion.   Follow Up Recommendations  SNF     Equipment Recommendations  None recommended by PT    Recommendations for Other Services       Precautions / Restrictions Precautions Precautions: Fall Restrictions Weight Bearing Restrictions: No    Mobility  Bed Mobility Overal bed mobility: Needs Assistance Bed Mobility: Supine to Sit     Supine to sit: Min assist Sit to supine: Mod assist   General bed mobility comments: needs assist for positioning and B LEs. Once seated at EOB, able to sit with supervision, however continues to keep eyes closed. Once seated improves to 98%.  Transfers                 General transfer comment: deferred secondary lethargy and no socks available  Ambulation/Gait                 Stairs             Wheelchair Mobility    Modified Rankin (Stroke  Patients Only)       Balance Overall balance assessment: Needs assistance Sitting-balance support: No upper extremity supported;Feet supported Sitting balance-Leahy Scale: Good Sitting balance - Comments: Able to sit EOB and participate in UB bathing/dressing with no LOB or trunk lean observed                                    Cognition Arousal/Alertness: Lethargic Behavior During Therapy: Flat affect Overall Cognitive Status: No family/caregiver present to determine baseline cognitive functioning                                 General Comments: fatigued but able to follow 1-step commands      Exercises Other Exercises Other Exercises: supine ther-ex performed on B LE including SLRs, hip abd/add, and heel slides. Also performed B UE shoulder flexion. Needs mod assist for initiation and completion of ther-ex and keeps eyes closed, falls asleep if not stimulated. O2 sats at 88% at rest    General Comments        Pertinent Vitals/Pain Pain Assessment: No/denies pain    Home Living                      Prior Function  PT Goals (current goals can now be found in the care plan section) Acute Rehab PT Goals Patient Stated Goal: to get stronger (Simultaneous filing. User may not have seen previous data.) PT Goal Formulation: Patient unable to participate in goal setting Time For Goal Achievement: 04/02/20 Potential to Achieve Goals: Good Progress towards PT goals: Progressing toward goals    Frequency    Min 2X/week      PT Plan Current plan remains appropriate    Co-evaluation              AM-PAC PT "6 Clicks" Mobility   Outcome Measure  Help needed turning from your back to your side while in a flat bed without using bedrails?: A Little Help needed moving from lying on your back to sitting on the side of a flat bed without using bedrails?: A Little Help needed moving to and from a bed to a chair (including  a wheelchair)?: A Little Help needed standing up from a chair using your arms (e.g., wheelchair or bedside chair)?: A Lot Help needed to walk in hospital room?: A Lot Help needed climbing 3-5 steps with a railing? : Total 6 Click Score: 14    End of Session Equipment Utilized During Treatment: Oxygen;Gait belt Activity Tolerance: Patient limited by lethargy Patient left: in bed;with bed alarm set Nurse Communication: Mobility status PT Visit Diagnosis: Unsteadiness on feet (R26.81);Other abnormalities of gait and mobility (R26.89);Muscle weakness (generalized) (M62.81)     Time: 8338-2505 PT Time Calculation (min) (ACUTE ONLY): 28 min  Charges:  $Therapeutic Exercise: 8-22 mins $Therapeutic Activity: 8-22 mins                     Greggory Stallion, PT, DPT [phone]    Winnie Umali 03/29/2020, 4:31 PM

## 2020-03-29 NOTE — Progress Notes (Signed)
PROGRESS NOTE    Malik [name]   DPO:[id number]  DOB: 1945/09/06  PCP: [facility], [place], NP    DOA: 03/16/2020 LOS: 15   Brief Narrative   75 y.o. 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 He presented to our ED on 12/29 with fever/chills and fatigue and he tested positive for COVID-19.  Family and patient left prior to being seen by ED provider back then. Family reports more lethargy, progressive dyspnea, found to be hypoxic and was sent to ER on 03/16/20.   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  Sepsis physiology has improved.  Lactic acidosis resolved.  Chest x-ray on admission showed multifocal pneumonia, consistent with COVID-19.  Of note, his chest x-ray in the ED on 12/29 also showed pneumonia, however family took him home at that time. Oxygen requirement improving, down from 10 L/min >> 3 L/min. 1/13-14: on 3 L/min supplemental oxygen 1/16-17: oxygen requirement up slightly to 5 >> 6 L/min  1/17 -chest x-ray stable to improved, Pro-Cal still negative, BNP only minimally elevated and patient euvolemic to dry on exam. 1/18 - remains stable on 6 L/min O2   1/19 -weaned to 2 L oxygen via nasal cannula Completed 5 days remdesivir -- obtain CTA chest to rule out PE if oxygenation worsens.  No tachycardia, low suspicion at this time. -- Continue  steroids, baricitinib -- Continue supplemental oxygen to maintain O2 sat above 88%, wean off as tolerated -- Supportive care w/ Mucinex, Combivent -- IS and flutter  Steroid-induced hyperglycemia -continue adjusting insulin for inpatient goal CBG 140-180 Type 2 diabetes -continue Lantus 5 units daily, Novolog 8 units TID WC + sliding scale  Hypoglycemia - Lantus reduced as above.  Hypoglycemia protocol.  Elevated troponin -likely demand ischemia in the setting of hypoxia.  No chest pain or acute ischemic changes on EKG.   Chronic diastolic CHF -appears clinically euvolemic.  Monitor volume status closely and consider Lasix.  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  Appears neurology recommendation in November was to continue Plavix, family's stated he is not taking this at home. --Outpatient neurology follow-up  Essential hypertension -chronic, stable.  Continue metoprolol, amlodipine, losartan, HCTZ  History of alcohol abuse -no recent heavy drinking per family  History of recent GI bleed -hospitalized for this following his stroke when he was on DAPT.  Currently not on antiplatelet therapy per family.   DVT prophylaxis: enoxaparin (LOVENOX) injection 40 mg Start: 03/17/20 1030   Diet:  Diet Orders (From admission, onward)    Start     Ordered   03/23/20 1210  DIET DYS 3 Room service appropriate? Yes with  Assist; Fluid consistency: Thin  Diet effective now       Comments: CARB MODIFIED MINCED meats, w/ extra Gravy; potatoes too. Cream Soups. ENSURE TID meals.  Question Answer Comment  Room service appropriate? Yes with Assist   Fluid consistency: Thin      03/23/20 1210            Code Status: Full Code    Subjective 03/29/20   Patient seen today at  bedside. No acute events reported per nursing. He is nonverbal as usual.Nods yes to every question  Disposition Plan & Communication   Status is: Inpatient  Remains inpatient appropriate because:Inpatient level of care appropriate due to severity of illness . Patient continues to have requirement for supplemental oxygen in setting of Covid PNA, requires further evaluation and close monitoring, weaning from oxygen.   Dispo: The patient is from: Home              Anticipated d/c is to: SNF              Anticipated d/c date is: 1-2 days              Patient currently is medically stable to d/c.  Waiting for SNF placement   Family Communication: Spoke with son Malik [name] by phone [phone] on 1/19   Consults, Procedures, Significant Events   Consultants:   None  Procedures:   None  Antimicrobials:  Anti-infectives (From admission, onward)   Start     Dose/Rate Route Frequency Ordered Stop   03/17/20 1000  remdesivir 100 mg in sodium chloride 0.9 % 100 mL IVPB  Status:  Discontinued       "Followed by" Linked Group Details   100 mg 200 mL/hr over 30 Minutes Intravenous Daily 03/16/20 1405 03/16/20 1405   03/17/20 1000  remdesivir 100 mg in sodium chloride 0.9 % 100 mL IVPB       "Followed by" Linked Group Details   100 mg 200 mL/hr over 30 Minutes Intravenous Daily 03/16/20 1403 03/20/20 0917   03/16/20 1500  remdesivir 200 mg in sodium chloride 0.9% 250 mL IVPB       "Followed by" Linked Group Details   200 mg 580 mL/hr over 30 Minutes Intravenous Once 03/16/20 1403 03/16/20 1727   03/16/20 1345  remdesivir 200 mg in sodium chloride 0.9% 250 mL IVPB  Status:  Discontinued       "Followed by" Linked Group Details   200 mg 580 mL/hr over 30 Minutes Intravenous Once 03/16/20 1405 03/16/20 1405   03/16/20 1300  cefTRIAXone (ROCEPHIN) 2 g in sodium chloride 0.9 % 100 mL IVPB        2 g 200 mL/hr over 30 Minutes Intravenous  Once 03/16/20 1250 03/16/20 1403   03/16/20 1300   azithromycin (ZITHROMAX) 500 mg in sodium chloride 0.9 % 250 mL IVPB        500 mg 250 mL/hr over 60 Minutes Intravenous  Once 03/16/20 1250 03/16/20 1554        Objective   Vitals:   03/29/20 0457 03/29/20 0812 03/29/20 1118 03/29/20 1516  BP: (!) 150/78 (!) 163/82 (!) 161/80 139/68  Pulse:  (!) 59 74 71  Resp:  16 18 16   Temp:  97.8 F (36.6 C) 98.3 F (36.8 C) 97.6 F (36.4 C)  TempSrc:  Oral Oral Oral  SpO2:  94% 92% 95%  Weight:      Height:        Intake/Output Summary (Last  24 hours) at 03/29/2020 1711 Last data filed at 03/29/2020 1500 Gross per 24 hour  Intake --  Output 2001 ml  Net -2001 ml   Filed Weights   03/21/20 0435 03/22/20 0340 03/23/20 0312  Weight: 87.1 kg 83.5 kg 83.6 kg    Physical Exam:  General exam: Resting in bed awake, no acute distress, nonverbal Respiratory system: Right-sided rhonchi, left side clear, on 2 L/min nasal cannula oxygen, normal respiratory effort at rest, no accessory muscle use Cardiovascular system: RRR, no pedal edema.   Gastrointestinal system: soft, nontender, +bowel sounds Labs   Data Reviewed: I have personally reviewed following labs and imaging studies  CBC: Recent Labs  Lab 03/25/20 0655 03/26/20 0620 03/27/20 0852 03/28/20 1436 03/29/20 0638  WBC 18.5* 20.0* 18.1* 17.4* 17.4*  NEUTROABS  --   --  14.5*  --  13.6*  HGB 13.0 13.2 12.1* 12.9* 12.7*  HCT 38.8* 40.3 37.5* 39.5 38.0*  MCV 78.1* 79.3* 80.3 80.8 79.5*  PLT 399 397 403* 415* [id number]*   Basic Metabolic Panel: Recent Labs  Lab 03/23/20 0623 03/24/20 0433 03/25/20 0655 03/26/20 0620  NA 145 146* 140 142  K 4.4 4.1 4.1 4.1  CL 109 111 107 107  CO2 24 26 25 25   GLUCOSE 268* 66* 141* 80  BUN 36* 33* 26* 22  CREATININE 1.14 1.10 0.86 0.91  CALCIUM 9.0 8.7* 8.7* 8.8*  MG  --   --   --  2.0   GFR: Estimated Creatinine Clearance: 72.4 mL/min (by C-G formula based on SCr of 0.91 mg/dL). Liver Function Tests: Recent Labs  Lab 03/23/20 0623  03/24/20 0433 03/25/20 0655 03/26/20 0620 03/29/20 0638  AST 31 33 26 31 25   ALT 30 27 30 29 25   ALKPHOS 71 61 66 67 71  BILITOT 1.1 1.0 1.1 1.3* 1.1  PROT 7.0 6.4* 6.2* 6.7 6.4*  ALBUMIN 3.0* 2.7* 2.6* 2.7* 2.7*   No results for input(s): LIPASE, AMYLASE in the last 168 hours. No results for input(s): AMMONIA in the last 168 hours. Coagulation Profile: No results for input(s): INR, PROTIME in the last 168 hours. Cardiac Enzymes: No results for input(s): CKTOTAL, CKMB, CKMBINDEX, TROPONINI in the last 168 hours. BNP (last 3 results) No results for input(s): PROBNP in the last 8760 hours. HbA1C: No results for input(s): HGBA1C in the last 72 hours. CBG: Recent Labs  Lab 03/28/20 1708 03/28/20 2127 03/29/20 0812 03/29/20 1118 03/29/20 1628  GLUCAP 134* 198* 70 235* 210*   Lipid Profile: No results for input(s): CHOL, HDL, LDLCALC, TRIG, CHOLHDL, LDLDIRECT in the last 72 hours. Thyroid Function Tests: No results for input(s): TSH, T4TOTAL, FREET4, T3FREE, THYROIDAB in the last 72 hours. Anemia Panel: No results for input(s): VITAMINB12, FOLATE, FERRITIN, TIBC, IRON, RETICCTPCT in the last 72 hours. Sepsis Labs: Recent Labs  Lab 03/27/20 0852  PROCALCITON <0.10    No results found for this or any previous visit (from the past 240 hour(s)).    Imaging Studies   No results found.   Medications   Scheduled Meds: . amLODipine  10 mg Oral Daily  . atorvastatin  80 mg Oral Daily  . baricitinib  4 mg Oral Daily  . enoxaparin (LOVENOX) injection  40 mg Subcutaneous Q24H  . folic acid  1 mg Oral Daily  . guaiFENesin-dextromethorphan  30 mL Oral BID  . hydrochlorothiazide  12.5 mg Oral Daily  . insulin aspart  0-15 Units Subcutaneous TID WC  . insulin aspart  8  Units Subcutaneous TID WC  . insulin glargine  5 Units Subcutaneous Daily  . Ipratropium-Albuterol  1 puff Inhalation Q6H WA  . losartan  100 mg Oral Daily  . metoprolol tartrate  25 mg Oral BID  .  pantoprazole  40 mg Oral Daily  . predniSONE  40 mg Oral Q breakfast  . senna-docusate  1 tablet Oral BID  . thiamine  100 mg Oral Daily   Continuous Infusions:     LOS: 13 days    Time spent: 25 minutes with greater than 50% spent at bedside in coordination of care    [place], DO [facility]  03/29/2020, 5:11 PM    If 7PM-7AM, please contact night-coverage. How to contact the [facility] Attending or Consulting provider [place] or covering provider during after hours [place], for this patient?    1. Check the care team in [facility] and look for a) attending/consulting [facility] provider listed and b) the [facility] team listed 2. Log into [email or web address] and use [place]'s universal password to access. If you do not have the password, please contact the hospital operator. 3. Locate the [facility] provider you are looking for under [facility] and page to a number that you can be directly reached. 4. If you still have difficulty reaching the provider, please page the [facility] [place] (Director on Call) for the Hospitalists listed on amion for assistance.

## 2020-03-29 NOTE — Progress Notes (Signed)
Noted order to ambulate patient on room air. Secure chat to Dr. Manuella Ghazi that patient is hemiplegic and per therapy note he is unsafe to ambulate at this time.

## 2020-03-29 NOTE — TOC Progression Note (Signed)
Transition of Care [facility]) - Progression Note    Patient Details  Name: Malik [name] MRN: [id number] Date of Birth: [birth year]  [facility] CM/SW Contact  Beverly Sessions, RN Phone Number: 03/29/2020, 3:48 PM  Clinical Narrative:      No bed offers.  Attempted to call son to discuss discharge planning. Voicemail box is full Will reattempt.   Extended bed search.  Patient now 12 days out from covid positive date       Expected Discharge Plan and Services                                                 Social Determinants of Health (SDOH) Interventions    Readmission Risk Interventions No flowsheet data found.

## 2020-03-29 NOTE — Progress Notes (Signed)
Occupational Therapy Treatment Patient Details Name: Malik [name] MRN: [id number] DOB: 1945-11-01 Today's Date: 03/29/2020    History of present illness 75 y.o. 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 He presented to our ED on 12/29 with fever/chills and fatigue and he tested positive for COVID-19.  He left AMA prior to being seen by ED provider. Family reports more lethargy, found to be hypoxic and was sent back to the ER.  MD assessment includes: Acute Hypoxic Respiratory Failure due to Covid-19 viral illness, severe sepsis, elevated troponin likely demand ischemia, and recent GI bleed.   OT comments  Pt seen for OT treatment on this date. Upon arrival to room, pt on 3L O2 via [place] and resting in bed following session with PT. Pt agreeable to OT tx. Pt required MIN A for supine<>sit transfer with HOB elevated. Pt was able to participate in seated UB bathing/dressing with MOD A and LB dressing with MAX A. Pt appeared fatigued and self-initiated return to bed following UB dressing/bathing, deferring LB bathing and OOB mobility in s/o of fatigue; SpO2 90-97% throughout seated ADLs. Pt continues to benefit from skilled OT services to improve activity tolerance, strength, and balance, and maximize return to PLOF. Will continue to follow POC. Discharge recommendation remains appropriate.    Follow Up Recommendations  SNF    Equipment Recommendations  Other (comment) (defer to next venue of care)       Precautions / Restrictions Precautions Precautions: Fall Restrictions Weight Bearing Restrictions: No       Mobility Bed Mobility Overal bed mobility: Needs Assistance Bed Mobility: Sit to Supine;Supine to Sit     Supine to sit: Min assist Sit to supine: Min assist   General bed  mobility comments: Patient requires assistance for RLE  Transfers                 General transfer comment: pt deferred further mobility in s/o fatigue following UB bathing/dressing    Balance Overall balance assessment: Needs assistance Sitting-balance support: No upper extremity supported;Feet supported Sitting balance-Leahy Scale: Good Sitting balance - Comments: Able to sit EOB and participate in UB bathing/dressing with no LOB or trunk lean observed                                   ADL either performed or assessed with clinical judgement   ADL Overall ADL's : Needs assistance/impaired     Grooming: Wash/dry face;Supervision/safety;Set up;Sitting   Upper Body Bathing: Moderate assistance Upper Body Bathing Details (indicate cue type and reason): Required assistance to wash L UE d/t RUE weakness     Upper Body Dressing : Moderate assistance;Sitting Upper Body Dressing Details (indicate cue type and reason): Required assistance to don/doff hospital gown d/t RUE weakness Lower Body Dressing: Maximal assistance;Sitting/lateral leans Lower Body Dressing Details (indicate cue type and reason): to don socks               General ADL Comments: Pt appeared fatigued and self-initiating return to bed following UB dressing/bathing; deferred LB bathing in s/o of fatigue. SpO2 90-97% throughout seated ADLs               Cognition Arousal/Alertness: Lethargic Behavior During Therapy: WFL for tasks assessed/performed;Flat affect  Overall Cognitive Status: No family/caregiver present to determine baseline cognitive functioning                                 General Comments: fatigued but able to follow 1-step commands                   Pertinent Vitals/ Pain       Pain Assessment: No/denies pain         Frequency  Min 2X/week        Progress Toward Goals  OT Goals(current goals can now be found in the care plan section)   Progress towards OT goals: Progressing toward goals  Acute Rehab OT Goals Patient Stated Goal: none stated Time For Goal Achievement: 04/05/20 Potential to Achieve Goals: Good  Plan Discharge plan remains appropriate;Frequency remains appropriate       AM-PAC OT "6 Clicks" Daily Activity     Outcome Measure   Help from another person eating meals?: A Little Help from another person taking care of personal grooming?: A Little Help from another person toileting, which includes using toliet, bedpan, or urinal?: A Lot Help from another person bathing (including washing, rinsing, drying)?: A Lot Help from another person to put on and taking off regular upper body clothing?: A Little Help from another person to put on and taking off regular lower body clothing?: A Lot 6 Click Score: 15    End of Session Equipment Utilized During Treatment: Oxygen  OT Visit Diagnosis: Other abnormalities of gait and mobility (R26.89);Muscle weakness (generalized) (M62.81)   Activity Tolerance Patient limited by fatigue   Patient Left in bed;with call bell/phone within reach;with bed alarm set   Nurse Communication Mobility status        Time: 5366-4403 OT Time Calculation (min): 20 min  Charges: OT General Charges $OT Visit: 1 Visit OT Treatments $Self Care/Home Management : 8-22 mins   Fredirick Maudlin, OTR/L [place]

## 2020-03-29 NOTE — Progress Notes (Signed)
Attempted to wean to room air. O2 sat dropped to 89% when oxygen removed. Replaced O2 at 2L/M via [place].

## 2020-03-30 LAB — GLUCOSE, CAPILLARY
Glucose-Capillary: 519 mg/dL (ref 70–99)
Glucose-Capillary: 72 mg/dL (ref 70–99)
Glucose-Capillary: 370 mg/dL — ABNORMAL HIGH (ref 70–99)

## 2020-03-30 MED ORDER — INSULIN ASPART 100 UNIT/ML ~~LOC~~ SOLN
5.0000 [IU] | Freq: Three times a day (TID) | SUBCUTANEOUS | Status: DC
  Administered 2020-03-30: 5 [IU] via SUBCUTANEOUS
  Filled 2020-03-30: qty 1

## 2020-03-30 NOTE — Progress Notes (Signed)
Inpatient Diabetes Program Recommendations  AACE/ADA: New Consensus Statement on Inpatient Glycemic Control   Target Ranges:  Prepandial:   less than 140 mg/dL      Peak postprandial:   less than 180 mg/dL (1-2 hours)      Critically ill patients:  140 - 180 mg/dL   Results for Malik [name], Malik [name] (MRN [id number]) as of 03/30/2020 09:04  Ref. Range 03/29/2020 08:12 03/29/2020 11:18 03/29/2020 16:28 03/29/2020 21:48 03/30/2020 08:10  Glucose-Capillary Latest Ref Range: 70 - 99 mg/dL 70 235 (H) 210 (H) 157 (H) 72   Review of Glycemic Control  Current orders for Inpatient glycemic control: Lantus 5 units daily, Novolog 5 units TID with meals, Novolog 0-15 units TID with meals; Prednisone 40 mg QAM  Inpatient Diabetes Program Recommendations:    Insulin: Please consider discontinuing Lantus.  Thanks, Barnie Alderman, RN, MSN, CDE Diabetes Coordinator Inpatient Diabetes Program [phone] (Team Pager from 8am to 5pm)

## 2020-03-30 NOTE — Progress Notes (Signed)
Attempted to wean patient off of O2 while getting VS. Patient's O2 saturation decreased into the upper 80s. Patient is back on 2L [place].

## 2020-03-30 NOTE — Progress Notes (Signed)
Attempted to call report to [facility], first call sent to voicemail box.  Second attempt, message received that call can't be completed at this time after ringing for a long time.

## 2020-03-30 NOTE — TOC Transition Note (Signed)
Transition of Care [facility]) - CM/SW Discharge Note   Patient Details  Name: Fredrick Geoghegan MRN: [id number] Date of Birth: 17-Jun-1945  Transition of Care [facility]) CM/SW Contact:  Beverly Sessions, RN Phone Number: 03/30/2020, 1:16 PM   Clinical Narrative:    Offers presented son and daughter accepted be at [facility] sent in hub  Bedside RN to call report Insurance auth obtained  EMS packet on chart.  EMS transport arranged for 3pm  Son updated    Final next level of care: [place] Barriers to Discharge: No Barriers Identified   Patient Goals and CMS Choice        Discharge Placement              Patient chooses bed at: [facility] Patient to be transferred to facility by: EMS Name of family member notified: son Patient and family notified of of transfer: 03/30/20  Discharge Plan and Services                                     Social Determinants of Health (SDOH) Interventions     Readmission Risk Interventions No flowsheet data found.

## 2020-03-30 NOTE — Progress Notes (Signed)
[facility]) Hospital Liaison RN note:  Received new referral for [facility] out patient palliative program to follow post discharge from Dr. Manuella Ghazi. Patient information was provided to referral. Isaias Cowman, TOC made aware. [facility] Liaison will follow for disposition.  Thank you for this referral.  Zandra Abts, RN [facility] Liaison [phone]

## 2020-03-30 NOTE — Discharge Summary (Signed)
[place] [facility] [place] NAME: Malik [name]    MR#:  [id number]  DATE OF BIRTH:  [birth year]  DATE OF ADMISSION:  03/16/2020   ADMITTING PHYSICIAN: Gwynne Edinger, MD  DATE OF DISCHARGE: 03/30/2020  PRIMARY CARE PHYSICIAN: [facility], [place], NP   ADMISSION DIAGNOSIS:  Lactic acid acidosis [E87.2] Hypoxia [R09.02] Elevated troponin [R77.8] Pneumonia due to COVID-19 virus [U07.1, J12.82] COVID-19 [U07.1] DISCHARGE DIAGNOSIS:  Principal Problem:   Pneumonia due to COVID-19 virus Active Problems:   Essential hypertension   Chronic diastolic congestive heart failure (HCC)   Hypertension   Diabetes mellitus type 2 in nonobese (HCC)   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 Angina of effort ([place])   . Chronic diastolic CHF (congestive heart failure) ([place])   . CVA (cerebral vascular accident) ([place] 08/10/2019   right sided weakness  . Diabetes ([place])   . Diabetes mellitus without complication ([place])   . Dilated cardiomyopathy ([place])   . Heart disease   . High blood pressure   . Hypertension   . 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 Moderate mitral insufficiency   . Neuropathy    with LE weakness  . Sleep apnea   . TBI (traumatic brain injury) [facility])    HOSPITAL COURSE:  75 y.o.malewith medical history significant forcva in 2021 with resulting hemiplegia and aphasia, htn, hfpef, dm, recent admission for upper GI bleed thought to be 2/2 angiodysplasia, etoh abuse, admitted for shortness of breath. He presented to our ED on 12/29 with fever/chills and fatigue and he tested positive for COVID-19.Family and patientleft prior to being seen by ED providerback then. Family reports more lethargy, progressive dyspnea, found to be hypoxic and  was sent to [facility] 03/16/20.  Acute respiratory failure with hypoxia secondary to COVID-19 infection, severe sepsis -present on admission and now resolved. Chest x-ray on admission showed multifocal pneumonia, consistent with COVID-19.  Of note, his chest x-ray in the ED on 12/29 also showed pneumonia, however family took him home at that time. Oxygen requirement improving, down from 10 L/min >>2 L/min. Completed remdesivir and steroid course.  Also received baricitinib  Steroid-induced hyperglycemia -blood sugar improved now as steroid has stopped. Type 2 diabetes -continue home regimen Hypoglycemia -resolved now.  Patient asymptomatic  Elevated troponin -due to demand ischemia in the setting of hypoxia.  No chest pain or acute ischemic changes on EKG.   Chronic diastolic CHF -appears clinically euvolemic. 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  Appears neurology recommendation in November was to continue Plavix, family's stated he is not taking this at home. --Outpatient neurology follow-up  Essential hypertension -chronic, stable.  Continue metoprolol, amlodipine, losartan, HCTZ  History of alcohol abuse -no recent heavy drinking per family  History of recent GI bleed -hospitalized for this following his stroke when he was on DAPT.  Currently not on antiplatelet therapy per family. DISCHARGE CONDITIONS:  Stable CONSULTS OBTAINED:   DRUG ALLERGIES:  No Known Allergies DISCHARGE MEDICATIONS:  Allergies as of 03/30/2020   No Known Allergies     Medication List    STOP taking these medications   insulin lispro 100 UNIT/ML injection Commonly known as: HUMALOG     TAKE these medications   acetaminophen 325 MG tablet Commonly known as: TYLENOL Take 650  mg by mouth every 8 (eight) hours as needed for mild pain, fever or moderate pain.   amLODipine 10 MG tablet Commonly known as: NORVASC Take 10 mg by mouth daily.   feeding supplement Liqd Take 237 mLs by mouth 3 (three) times daily between meals.   folic acid 654 MCG tablet Commonly known as: FOLVITE Take 800 mcg by mouth daily.   losartan 25 MG tablet Commonly known as: COZAAR Take 25 mg by mouth daily.   metFORMIN 500 MG tablet Commonly known as: GLUCOPHAGE Take 1,000 mg by mouth 2 (two) times daily with a meal.   metoprolol tartrate 25 MG tablet Commonly known as: LOPRESSOR Take 1 tablet (25 mg total) by mouth 2 (two) times daily.   pantoprazole 40 MG tablet Commonly known as: PROTONIX Take 1 tablet (40 mg total) by mouth daily.   Resource ThickenUp Clear Powd Take 120 g by mouth as needed (for nectar thick liquids).      DISCHARGE INSTRUCTIONS:   DIET:  Low fat, Low cholesterol diet Mech Soft consistency diet w/ Minced meats, moistened foods/gravies; Thin liquids = 1-2 SIPS at a time via Cup/Straw monitored. General aspiration precautions, Pills WHOLE vs Crushed in Puree for safer, easier swallowing (per NSG). Support w/ feeding at All meals d/t overall weakness sec. to lengthy illness.  DISCHARGE CONDITION:  Stable ACTIVITY:  Activity as tolerated OXYGEN:  Home Oxygen: Yes.    Oxygen Delivery: 2 liters/min via Patient connected to nasal cannula oxygen DISCHARGE LOCATION:  nursing home   If you experience worsening of your admission symptoms, develop shortness of breath, life threatening emergency, suicidal or homicidal thoughts you must seek medical attention immediately by calling 911 or calling your MD immediately  if symptoms less severe.  You Must read complete instructions/literature along with all the possible adverse reactions/side effects for all the Medicines you take and that have been prescribed to you. Take any new Medicines after you have  completely understood and accpet all the possible adverse reactions/side effects.   Please note  You were cared for by a hospitalist during your hospital stay. If you have any questions about your discharge medications or the care you received while you were in the hospital after you are discharged, you can call the unit and asked to speak with the hospitalist on call if the hospitalist that took care of you is not available. Once you are discharged, your primary care physician will handle any further medical issues. Please note that NO REFILLS for any discharge medications will be authorized once you are discharged, as it is imperative that you return to your primary care physician (or establish a relationship with a primary care physician if you do not have one) for your aftercare needs so that they can reassess your need for medications and monitor your lab values.    On the day of Discharge:  VITAL SIGNS:  Blood pressure (!) 159/80, pulse (!) 59, temperature (!) 97.4 F (36.3 C), temperature source Oral, resp. rate 15, height 5\' 10"  (1.778 m), weight 83.6 kg, SpO2 92 %. PHYSICAL EXAMINATION:  GENERAL:  75 y.o.-year-old patient lying in the bed with no acute distress.  Nonverbal EYES: Pupils  equal, round, reactive to light and accommodation. No scleral icterus. Extraocular muscles intact.  HEENT: Head atraumatic, normocephalic. Oropharynx and nasopharynx clear.  NECK:  Supple, no jugular venous distention. No thyroid enlargement, no tenderness.  LUNGS: Normal breath sounds bilaterally, no wheezing, rales,rhonchi or crepitation. No use of accessory muscles of respiration.  CARDIOVASCULAR: S1, S2 normal. No murmurs, rubs, or gallops.  ABDOMEN: Soft, non-tender, non-distended. Bowel sounds present. No organomegaly or mass.  EXTREMITIES: No pedal edema, cyanosis, or clubbing.  NEUROLOGIC: Cranial nerves II through XII are intact. Muscle strength 5/5 in all extremities. Sensation intact. Gait not  checked.  PSYCHIATRIC: The patient is alert and oriented x 3.  SKIN: No obvious rash, lesion, or ulcer.  DATA REVIEW:   CBC Recent Labs  Lab 03/29/20 0638  WBC 17.4*  HGB 12.7*  HCT 38.0*  PLT 409*    Chemistries  Recent Labs  Lab 03/26/20 0620 03/29/20 0638  NA 142  --   K 4.1  --   CL 107  --   CO2 25  --   GLUCOSE 80  --   BUN 22  --   CREATININE 0.91  --   CALCIUM 8.8*  --   MG 2.0  --   AST 31 25  ALT 29 25  ALKPHOS 67 71  BILITOT 1.3* 1.1     Outpatient follow-up  Contact information for follow-up providers    [facility], [place], NP. Schedule an appointment as soon as possible for a visit in 1 week(s).   Contact information: [place] [address] [phone]        Corey Skains, MD. Schedule an appointment as soon as possible for a visit in 2 week(s).   Specialty: Cardiology Contact information: [address] [place] [facility] [place] [address] [phone]        Lonia Farber, MD. Schedule an appointment as soon as possible for a visit in 1 week(s).   Specialties: Internal Medicine, Endocrinology Contact information: [place] [address] [phone]            Contact information for after-discharge care    [facility] .   Service: Skilled Nursing Contact information: [address] [place] [phone]                  30 Day Unplanned Readmission Risk Score   Flowsheet Row ED to Hosp-Admission (Current) from 03/16/2020 in [place]  30 Day Unplanned Readmission Risk Score (%) 31.08 Filed at 03/30/2020 0801     This score is the patient's risk of an unplanned readmission within 30 days of being discharged (0 -100%). The score is based on dignosis, age, lab data, medications, orders, and past utilization.   Low:  0-14.9   Medium:  15-21.9   High: 22-29.9   Extreme: 30 and above         Management plans discussed with the patient, family and they are in agreement.  CODE STATUS: Full Code   TOTAL TIME TAKING CARE OF THIS PATIENT: 45 minutes.    Max Sane M.D on 03/30/2020 at 11:17 AM  [facility]   CC: Primary care physician; [facility], [place], NP   Note: This dictation was prepared with Dragon dictation along with smaller phrase technology. Any transcriptional errors that result from this process are unintentional.

## 2020-03-30 NOTE — Progress Notes (Signed)
Report called to [facility] nurse.  Patient left unit via stretcher/EMS transport with belongings in bag and AVS given to EMS.

## 2020-04-17 ENCOUNTER — Non-Acute Institutional Stay: Payer: Medicare Other | Admitting: Primary Care

## 2020-04-17 ENCOUNTER — Other Ambulatory Visit: Payer: Self-pay

## 2020-04-17 DIAGNOSIS — Z8673 Personal history of transient ischemic attack (TIA), and cerebral infarction without residual deficits: Secondary | ICD-10-CM

## 2020-04-17 DIAGNOSIS — N183 Chronic kidney disease, stage 3 unspecified: Secondary | ICD-10-CM

## 2020-04-17 DIAGNOSIS — I69391 Dysphagia following cerebral infarction: Secondary | ICD-10-CM

## 2020-04-17 DIAGNOSIS — Z515 Encounter for palliative care: Secondary | ICD-10-CM

## 2020-04-17 DIAGNOSIS — J1282 Pneumonia due to coronavirus disease 2019: Secondary | ICD-10-CM

## 2020-04-17 NOTE — Progress Notes (Signed)
[facility] Consult Note Telephone: [phone]  Fax: [phone]     Date of encounter: 04/17/20 PATIENT NAME: Malik [name] [address] [address] [phone] (home)  DOB: 05-08-1945 MRN: [id number]  PRIMARY CARE PROVIDER:    Bonnita Nasuti, MD [address] [place],  [place] [address] [phone] PROVIDER:   Bonnita Nasuti, MD [address] [place],  [place] [address] [phone]  RESPONSIBLE PARTY:   Extended Emergency Contact Information Primary Emergency Contact: Mclean, Moya Address: [address]          Lorina Rabon [phone] Johnnette Litter of [place] Phone: [phone] Mobile Phone: [phone] Relation: Son Secondary Emergency Contact: Dorothy Puffer Mobile Phone: [phone] Relation: Daughter Preferred language: English Interpreter needed? No  I met face to face with patient in the facility. Palliative Care was asked to follow this patient by consultation request of [place], Rosalyn Charters, MD to help address advance care planning and goals of care. This is the initial visit.   ASSESSMENT AND RECOMMENDATIONS:   1. Advance Care Planning/Goals of Care: Goals include to maximize quality of life and symptom management. Disposition is likely to home since he does not have Medicaid and may not be eligible. I've reached out to Son Malik [name] but his mailbox was full and I was not able to leave a message. I will continue to follow and can transition into community palliative at home if necessary.  Patient has most  on record with full scope of interventions.  2. Symptom Management:  I met with Malik [name] today. He has been here following a hospitalization for rehab, with a goal of returning home. He had been living with his son and is now ambulatory with assistance. Due to his aphasia, he's not able to answer many questions but he does deny pain and endorses  comfort.  He agrees that he is eating well. Oxygen is flowing at 4 L per min by nasal cannula and he appears comfortable at rest, with no dyspnea.  3. Follow up Palliative Care Visit: Palliative care will continue to follow for goals of care clarification and symptom management. Return 4 weeks or prn.  4. Family /Caregiver/Community Supports:  Son is POA, currently in SNF.  5. Cognitive / Functional decline: A and o x 1. Aphasia.   I spent 25 minutes providing this consultation,  from 1300 to 1325. More than 50% of the time in this consultation was spent in counseling and care coordination.  CODE STATUS: FULL  PPS: 40%  HOSPICE ELIGIBILITY/DIAGNOSIS: TBD  Subjective:  CHIEF COMPLAINT: debility  HISTORY OF PRESENT ILLNESS:  Malik [name] is a 75 y.o. 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   We are asked to consult around advance care planning and complex medical decision making.    Review and summarization of old Epic records shows or history from other than patient.  History obtained from review of EMR, discussion with primary team, and  interview with family, caregiver  and/or Malik [name]. Records reviewed and summarized above.   CURRENT PROBLEM LIST:  Patient Active Problem List   Diagnosis Date Noted  . Pneumonia due to COVID-19 virus 03/16/2020  . Severe sepsis ([place]) 03/16/2020  . Respiratory failure, acute ([place]) 03/16/2020  . Hemiparesis ([place]) 03/16/2020  . Aphasia 03/16/2020  . Goals of care, counseling/discussion   . Palliative care by specialist   . Acute anemia 09/28/2019  . History  of CVA (cerebrovascular accident) 09/28/2019  . Hyperlipidemia LDL goal <70 08/24/2019  . Dysphagia due to recent cerebral infarction 08/24/2019  . Severe protein-calorie malnutrition ([place]) 08/24/2019  . Iron deficiency anemia 08/24/2019  . AKI (acute kidney injury) ([place]) 08/24/2019  . CKD (chronic kidney disease), stage IIIa 08/24/2019  . Alcohol  abuse 08/24/2019  . Obesity 08/24/2019  . Multinodular thyroid 08/24/2019  . Pressure injury of skin 08/13/2019  . Embolic stroke ([place]) L MCA s/p tPA, unknown source 08/10/2019  . Thrombocytosis   . Acute lower UTI   . Labile blood pressure   . Hydrothorax   . Sleep disturbance   . Pleural effusion on left   . Hypertensive crisis   . Hypertension   . Leukocytosis   . Benign essential HTN   . Diabetes mellitus type 2 in nonobese (HCC)   . Acute blood loss anemia   . Trauma 05/27/2017  . TBI (traumatic brain injury) ([place])   . Essential hypertension   . Chronic diastolic congestive heart failure ([place])   . 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 Rib fractures 05/10/2017  . 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   PAST MEDICAL HISTORY:  Active Ambulatory Problems    Diagnosis Date Noted  . 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  . 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 Rib fractures 05/10/2017  . Trauma 05/27/2017  . TBI (traumatic brain injury) ([place])   . Essential hypertension   . Chronic diastolic congestive heart failure ([place])   . Hypertension   . Leukocytosis   . Benign essential HTN   . Diabetes mellitus type 2 in nonobese (HCC)   . Acute blood loss anemia   . Sleep disturbance   . Pleural effusion on left   . Hypertensive crisis   . Hydrothorax   . Labile blood pressure   . Acute lower UTI   . Thrombocytosis   . Embolic stroke ([place]) L MCA s/p tPA, unknown source 08/10/2019  . Pressure injury of skin 08/13/2019  . Hyperlipidemia LDL goal <70 08/24/2019  . Dysphagia due to recent cerebral infarction 08/24/2019  . Severe protein-calorie malnutrition ([place]) 08/24/2019  . Iron deficiency anemia 08/24/2019  . AKI (acute kidney injury) ([place]) 08/24/2019  . CKD (chronic kidney disease), stage IIIa 08/24/2019  . Alcohol abuse 08/24/2019  . Obesity 08/24/2019  . Multinodular thyroid 08/24/2019  . Acute anemia 09/28/2019  . History of CVA (cerebrovascular  accident) 09/28/2019  . Goals of care, counseling/discussion   . Palliative care by specialist   . Pneumonia due to COVID-19 virus 03/16/2020  . Severe sepsis ([place]) 03/16/2020  . Respiratory failure, acute ([place]) 03/16/2020  . Hemiparesis ([place]) 03/16/2020  . Aphasia 03/16/2020   Resolved Ambulatory Problems    Diagnosis Date Noted  . No Resolved Ambulatory Problems   Past Medical History:  Diagnosis Date  . Angina of effort ([place])   . Chronic diastolic CHF (congestive heart failure) ([place])   . CVA (cerebral vascular accident) ([place]) 08/10/2019  . Diabetes ([place])   . Diabetes mellitus without complication ([place])   . Dilated cardiomyopathy ([place])   . Heart disease   . High blood pressure   . 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 Moderate mitral insufficiency   . Neuropathy   . Sleep apnea    SOCIAL HX:  Social History   Tobacco Use  . Smoking status: Former Smoker    Packs/day: 1.50    Years: 30.00  Pack years: 45.00    Types: Cigarettes  . Smokeless tobacco: Never Used  Substance Use Topics  . Alcohol use: Yes   FAMILY HX:  Family History  Problem Relation Age of Onset  . Hypertension Mother   . Hypertension Other       ALLERGIES: No Known Allergies   PERTINENT MEDICATIONS:  Outpatient Encounter Medications as of 04/17/2020  Medication Sig  . acetaminophen (TYLENOL) 325 MG tablet Take 650 mg by mouth every 8 (eight) hours as needed for mild pain, fever or moderate pain.  Marland Kitchen amLODipine (NORVASC) 10 MG tablet Take 10 mg by mouth daily.  . feeding supplement, ENSURE ENLIVE, (ENSURE ENLIVE) LIQD Take 237 mLs by mouth 3 (three) times daily between meals.  . folic acid (FOLVITE) 009 MCG tablet Take 800 mcg by mouth daily.  Marland Kitchen losartan (COZAAR) 25 MG tablet Take 25 mg by mouth daily.  . Maltodextrin-Xanthan Gum (RESOURCE THICKENUP CLEAR) POWD Take 120 g by mouth as needed (for nectar thick liquids).  . metFORMIN (GLUCOPHAGE) 500 MG tablet Take  1,000 mg by mouth 2 (two) times daily with a meal.  . metoprolol tartrate (LOPRESSOR) 25 MG tablet Take 1 tablet (25 mg total) by mouth 2 (two) times daily.  . pantoprazole (PROTONIX) 40 MG tablet Take 1 tablet (40 mg total) by mouth daily.   No facility-administered encounter medications on file as of 04/17/2020.    Objective: ROS/staff  General: NAD ENMT: endorses dysphagia, aphasia Cardiovascular: denies chest pain Pulmonary: denies cough, endorses increased SOB Abdomen: endorses good appetite, denies  constipation, endorses continence of bowel GU: denies dysuria, endorses continence of urine MSK:  endorses ROM limitations, no falls reported Skin: denies rashes or wounds Neurological: endorses increased weakness, denies pain, denies insomnia Psych: Endorses positive mood Heme/lymph/immuno: denies bruises, abnormal bleeding  Physical Exam: Current and past weights: 184 lbs Constitutional:  NAD General: frail appearing, wnwd EYES: anicteric sclera, lids intact, no discharge  ENMT: intact hearing,oral mucous membranes moist CV: S1S2, RRR, no LE edema Pulmonary: LCTA, slight increased work of breathing, no cough, no audible wheezes,oxygen at 4 L Abdomen: intake 80%, normo-active BS +  4 quadrants, soft and non tender, no ascites GU: deferred MSK: mild sarcopenia, decreased ROM in all extremities, no contractures of LE,  ambulatory with help Skin: warm and dry, no rashes or wounds on visible skin Neuro: Generalized weakness, moderate cognitive impairment Psych: non-anxious affect, A and O x 1 Hem/lymph/immuno: no widespread bruising   Thank you for the opportunity to participate in the care of Mr. Corsino.  The palliative care team will continue to follow. Please call our office at [phone] if we can be of additional assistance.  Jason Coop, NP , DNP, MPH, AGPCNP-BC, ACHPN   COVID-19 PATIENT SCREENING TOOL  Person answering questions:  _______staff____________   1.  Is the patient or any family member in the home showing any signs or symptoms regarding respiratory infection?                  Person with Symptom  ______________na___________ a. Fever/chills/headache                                                        Yes___ No__X_            b. Shortness of breath  Yes___ No__X_           c. Cough/congestion                                               Yes___  No__X_          d. Muscle/Body aches/pains                                                   Yes___ No__X_         e. Gastrointestinal symptoms (diarrhea,nausea)             Yes___ No__X_         f. Sudden loss of smell or taste      Yes___ No__X_        2. Within the past 10 days, has anyone living in the home had any contact with someone with or under investigation for COVID-19?    Yes___ No__X__   Person __________________

## 2020-05-11 ENCOUNTER — Other Ambulatory Visit: Payer: Self-pay

## 2020-05-11 ENCOUNTER — Non-Acute Institutional Stay: Payer: Medicare Other | Admitting: Primary Care

## 2020-05-11 DIAGNOSIS — R4701 Aphasia: Secondary | ICD-10-CM

## 2020-05-11 DIAGNOSIS — Z515 Encounter for palliative care: Secondary | ICD-10-CM

## 2020-05-11 DIAGNOSIS — Z9981 Dependence on supplemental oxygen: Secondary | ICD-10-CM

## 2020-05-11 DIAGNOSIS — Z8673 Personal history of transient ischemic attack (TIA), and cerebral infarction without residual deficits: Secondary | ICD-10-CM

## 2020-05-11 NOTE — Progress Notes (Signed)
[facility] Consult Note Telephone: [phone]  Fax: [phone]    Date of encounter: 05/11/20 PATIENT NAME: Malik [name] [id number] [phone] (home)  DOB: Jul 02, 1945 MRN: [id number]  PRIMARY CARE PROVIDER:    Bonnita Nasuti, MD [address] [place],  [place] [address] [phone]   REFERRING PROVIDER:   Bonnita Nasuti, MD [place],  [place] [address] [phone]  RESPONSIBLE PARTY:   Extended Emergency Contact Information Primary Emergency Contact: Bill, Yohn Address: [address]          [place] Phone: [phone] Mobile Phone: [phone] Relation: Son Secondary Emergency Contact: Dorothy Puffer Mobile Phone: [phone] Relation: Daughter Preferred language: English Interpreter needed? No  I met face to face with patient in facility. Palliative Care was asked to follow this patient by consultation request of [place], Rosalyn Charters, MD  to help address advance care planning and goals of care. This is a follow up  visit.   ASSESSMENT AND RECOMMENDATIONS:   1. Advance Care Planning/Goals of Care: Goals include to maximize quality of life and symptom management. T/c to son Malik [name]. He states father will remain in LTC due to deficits of CVA. He was living at home with a caregiver prior to the stroke. He denies any concerns about safety, symptom management or changing any of his advance directives. Currently his MOST includes full code and full scope of treatments.  2. Symptom Management: I met with Malik [name] today. He has been in SNF following a hospitalization for rehab, with a goal of returning home. He had been living with his son and is now ambulatory with assistance. Due to his aphasia, he's not able to answer many questions but he does deny pain and endorses  comfort. He agrees that he is eating  well. He agrees to working with therapy and ambulating with a walker. Oxygen present via [place]. Pt is in NAD.   3. Follow up Palliative Care Visit: Palliative care will continue to follow for goals of care clarification and symptom management. Return 4-6 weeks or prn.  4. Family /Caregiver/Community Supports: Prior to rehab, had been living alone with caregivers.  5. Cognitive / Functional decline: Difficult to assess cognitive status due to severe aphasia. Does answer yes/no questions and able to follow all simple commands. Functionally, staff states that he is ambulating well with a walker. He is able to get to the bathroom unassisted.  I spent 25 minutes providing this consultation,  from 1050 to 1115. More than 50% of the time in this consultation was spent in counseling and care coordination.  CODE STATUS: Full Code   PPS: 40%  HOSPICE ELIGIBILITY/DIAGNOSIS: TBD  Subjective:  CHIEF COMPLAINT: Follow up, rehab admission s/p CVA, debility  HISTORY OF PRESENT ILLNESS:  Malik [name] is a 75 y.o. year old male  with Malik [name] is a 75 y.o. 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 He is dependent for most adls as he has R hemiparesis s/p cva. He needs assistance with meal set up, all bathing, and stand by ambulation.   We are asked to consult around advance care planning and complex medical decision making.    Review and summarization of old Epic records shows or history from other than patient.  Review or lab tests, radiology,  or medicine. Liver Fx, D dimer shows elevation. Low albumin  2.7 Review of case with family member. Spoke with Malik [name], son  History obtained from review of EMR, discussion with primary team, and  interview with family, caregiver  and/or Malik [name]. Records reviewed and summarized above.   CURRENT PROBLEM LIST:  Patient Active Problem List   Diagnosis Date Noted  . Pneumonia due to COVID-19 virus  03/16/2020  . Severe sepsis ([place]) 03/16/2020  . Respiratory failure, acute ([place]) 03/16/2020  . Hemiparesis ([place]) 03/16/2020  . Aphasia 03/16/2020  . Goals of care, counseling/discussion   . Palliative care by specialist   . Acute anemia 09/28/2019  . History of CVA (cerebrovascular accident) 09/28/2019  . Hyperlipidemia LDL goal <70 08/24/2019  . Dysphagia due to recent cerebral infarction 08/24/2019  . Severe protein-calorie malnutrition ([place]) 08/24/2019  . Iron deficiency anemia 08/24/2019  . AKI (acute kidney injury) ([place]) 08/24/2019  . CKD (chronic kidney disease), stage IIIa 08/24/2019  . Alcohol abuse 08/24/2019  . Obesity 08/24/2019  . Multinodular thyroid 08/24/2019  . Pressure injury of skin 08/13/2019  . Embolic stroke ([place]) L MCA s/p tPA, unknown source 08/10/2019  . Thrombocytosis   . Acute lower UTI   . Labile blood pressure   . Hydrothorax   . Sleep disturbance   . Pleural effusion on left   . Hypertensive crisis   . Hypertension   . Leukocytosis   . Benign essential HTN   . Diabetes mellitus type 2 in nonobese (HCC)   . Acute blood loss anemia   . Trauma 05/27/2017  . TBI (traumatic brain injury) ([place])   . Essential hypertension   . Chronic diastolic congestive heart failure ([place])   . 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 Rib fractures 05/10/2017  . 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   PAST MEDICAL HISTORY:  Active Ambulatory Problems    Diagnosis Date Noted  . 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  . 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 Rib fractures 05/10/2017  . Trauma 05/27/2017  . TBI (traumatic brain injury) ([place])   . Essential hypertension   . Chronic diastolic congestive heart failure ([place])   . Hypertension   . Leukocytosis   . Benign essential HTN   . Diabetes mellitus type 2 in nonobese (HCC)   . Acute blood loss anemia   . Sleep disturbance   . Pleural effusion on left   . Hypertensive crisis   . Hydrothorax    . Labile blood pressure   . Acute lower UTI   . Thrombocytosis   . 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 Pressure injury of skin 08/13/2019  . Hyperlipidemia LDL goal <70 08/24/2019  . Dysphagia due to recent cerebral infarction 08/24/2019  . Severe protein-calorie malnutrition ([place]) 08/24/2019  . Iron deficiency anemia 08/24/2019  . AKI (acute kidney injury) ([place]) 08/24/2019  . CKD (chronic kidney disease), stage IIIa 08/24/2019  . Alcohol abuse 08/24/2019  . Obesity 08/24/2019  . Multinodular thyroid 08/24/2019  . Acute anemia 09/28/2019  . History of CVA (cerebrovascular accident) 09/28/2019  . Goals of care, counseling/discussion   . Palliative care by specialist   . Pneumonia due to COVID-19 virus 03/16/2020  . Severe sepsis ([place]) 03/16/2020  . Respiratory failure, acute ([place]) 03/16/2020  . Hemiparesis ([place]) 03/16/2020  . Aphasia 03/16/2020   Resolved Ambulatory Problems    Diagnosis Date Noted  . No Resolved Ambulatory Problems   Past Medical History:  Diagnosis Date  . Angina of  effort ([place])   . Chronic diastolic CHF (congestive heart failure) ([place])   . CVA (cerebral vascular accident) ([place]) 08/10/2019  . Diabetes ([place])   . Diabetes mellitus without complication ([place])   . Dilated cardiomyopathy ([place])   . Heart disease   . High blood pressure   . 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 Moderate mitral insufficiency   . Neuropathy   . Sleep apnea    SOCIAL HX:  Social History   Tobacco Use  . Smoking status: Former Smoker    Packs/day: 1.50    Years: 30.00    Pack years: 45.00    Types: Cigarettes  . Smokeless tobacco: Never Used  Substance Use Topics  . Alcohol use: Yes   FAMILY HX:  Family History  Problem Relation Age of Onset  . Hypertension Mother   . Hypertension Other       ALLERGIES: No Known Allergies   PERTINENT MEDICATIONS:  Outpatient Encounter Medications as of 05/11/2020   Medication Sig  . acetaminophen (TYLENOL) 325 MG tablet Take 650 mg by mouth every 8 (eight) hours as needed for mild pain, fever or moderate pain.  Marland Kitchen amLODipine (NORVASC) 10 MG tablet Take 10 mg by mouth daily.  . feeding supplement, ENSURE ENLIVE, (ENSURE ENLIVE) LIQD Take 237 mLs by mouth 3 (three) times daily between meals.  . folic acid (FOLVITE) 124 MCG tablet Take 800 mcg by mouth daily.  Marland Kitchen losartan (COZAAR) 25 MG tablet Take 25 mg by mouth daily.  . Maltodextrin-Xanthan Gum (RESOURCE THICKENUP CLEAR) POWD Take 120 g by mouth as needed (for nectar thick liquids).  . metFORMIN (GLUCOPHAGE) 500 MG tablet Take 1,000 mg by mouth 2 (two) times daily with a meal.  . metoprolol tartrate (LOPRESSOR) 25 MG tablet Take 1 tablet (25 mg total) by mouth 2 (two) times daily.  . pantoprazole (PROTONIX) 40 MG tablet Take 1 tablet (40 mg total) by mouth daily.   No facility-administered encounter medications on file as of 05/11/2020.    Objective: ROS (with staff, patient, and family) General: NAD ENMT: denies dysphagia Cardiovascular: denies chest pain Pulmonary: denies  cough, denies increased SOB Abdomen: endorses good  appetite, denies constipation, endorses continence of bowel GU: denies dysuria, endorses continence of urine MSK:  endorses ROM limitations, no falls reported Skin: denies rashes or wounds Neurological: endorses weakness, denies pain, denies insomnia Psych: Endorses positive mood Heme/lymph/immuno: denies bruises, abnormal bleeding  Physical Exam: Current and past weights: Stable per nursing home records, 184 lbs Constitutional:  NAD General: frail appearing,wnwd EYES: anicteric sclera, lids intact, no discharge  ENMT: intact hearing,oral mucous membranes moist CV: S1S2, RRR, no LE edema Pulmonary: LCTA, no increased work of breathing, no cough, no audible wheezes, oxygen 2 l / [place] Abdomen: intake 50-75%, no ascites GU: deferred MSK: mild sarcopenia, decreased ROM in all  extremities,R hemiparesis,  no contractures of LE,  ambulatory Skin: warm and dry, no rashes or wounds on visible skin Neuro: Generalized weakness, mild  cognitive impairment Psych: non-anxious affect, A and O x 2 Hem/lymph/immuno: no widespread bruising   Thank you for the opportunity to participate in the care of Malik [name].  The palliative care team will continue to follow. Please call our office at [phone] if we can be of additional assistance.  Jason Coop, NP , DNP, MPH, AGPCNP-BC, ACHPN   COVID-19 PATIENT SCREENING TOOL  Person answering questions: _______pt/staff___________   1.  Is the patient or any family member  in the home showing any signs or symptoms regarding respiratory infection?                  Person with Symptom  ______________na___________ a. Fever/chills/headache                                                        Yes___ No__X_            b. Shortness of breath                                                            Yes___ No__X_           c. Cough/congestion                                               Yes___  No__X_          d. Muscle/Body aches/pains                                                   Yes___ No__X_         e. Gastrointestinal symptoms (diarrhea,nausea)             Yes___ No__X_         f. Sudden loss of smell or taste      Yes___ No__X_        2. Within the past 10 days, has anyone living in the home had any contact with someone with or under investigation for COVID-19?    Yes___ No__X__   Person __________________

## 2020-06-12 ENCOUNTER — Non-Acute Institutional Stay: Payer: Medicare Other | Admitting: Primary Care

## 2020-06-12 ENCOUNTER — Other Ambulatory Visit: Payer: Self-pay

## 2020-06-12 DIAGNOSIS — Z8673 Personal history of transient ischemic attack (TIA), and cerebral infarction without residual deficits: Secondary | ICD-10-CM

## 2020-06-12 DIAGNOSIS — G8191 Hemiplegia, unspecified affecting right dominant side: Secondary | ICD-10-CM

## 2020-06-12 DIAGNOSIS — I69391 Dysphagia following cerebral infarction: Secondary | ICD-10-CM

## 2020-06-12 DIAGNOSIS — Z515 Encounter for palliative care: Secondary | ICD-10-CM

## 2020-06-12 DIAGNOSIS — R4701 Aphasia: Secondary | ICD-10-CM

## 2020-06-12 NOTE — Progress Notes (Signed)
[facility] Consult Note Telephone: [phone]  Fax: [phone]    Date of encounter: 06/12/20 PATIENT NAME: Malik [name] [address] [place] [address]   [phone] (home)  DOB: April 30, 1945 MRN: [id number] PRIMARY CARE PROVIDER:    Bonnita Nasuti, MD,  [place] [address] [phone]  REFERRING PROVIDER:   Bonnita Nasuti, MD [place],  [place] [address] [phone]  RESPONSIBLE PARTY:    Contact Information    Name Relation Home Work Mobile   Malik [name] Son [phone]  [phone]   Malik [name] Daughter   [phone]      I met face to face with patient in facility. Palliative Care was asked to follow this patient by consultation request of  Malik [name], Malik Charters, MD to address advance care planning and complex medical decision making. This is the follow up visit.    ASSESSMENT AND RECOMMENDATIONS:    1. Advance Care Planning/Goals of Care: Goals include to maximize quality of life and symptom management.  MOST on file with [facility].Feeding tube not addressed. Son is POA.  2. Symptom Management:   Met with patient in his nursing home room. He was finishing lunch and stated he had a good appetite. He denies pain. States he does not see his son often due to the distance.   Dyspnea: He denied dyspnea but he uses oxygen at night. His concentrator was still running but he did not have it on and showed no signs of dyspnea.  Mood: He states he does not enjoy congregate activities. Staff endorses this. Patient takes his meals in his room and does not participate in activities. I would recommend addressing possible depression and having activities work with him one on one for social engagement.  3. Follow up Palliative Care Visit: Palliative care will continue to follow for goals of care clarification and symptom management. Return 6-8 weeks or prn.  4. Family  /Caregiver/Community Supports: Son is POA, lives in [place].  5. Cognitive / Functional decline: a and o x 2, dependent in all iadls, most adls.  I spent 25 minutes providing this consultation. More than 50% of the time in this consultation was spent in counseling and care coordination.  CODE STATUS: FULL  PPS: 40%  HOSPICE ELIGIBILITY/DIAGNOSIS: TBD  CHIEF COMPLAINT: debility  HISTORY OF PRESENT ILLNESS:  Malik [name] is a 75 y.o. year old male  with s/p cva and R hemiplegia and dysphagia. Oxygen dependent at hs on 4 l. Was living at home alone prior to this stroke. Now living in LTC.   History obtained from review of EMR, discussion with primary team, and  interview with family, caregiver  and/or Malik [name]. Records reviewed and summarized above.   CURRENT PROBLEM LIST:  Patient Active Problem List   Diagnosis Date Noted  . Pneumonia due to COVID-19 virus 03/16/2020  . Severe sepsis ([place]) 03/16/2020  . Respiratory failure, acute ([place]) 03/16/2020  . Hemiparesis ([place]) 03/16/2020  . Aphasia 03/16/2020  . Goals of care, counseling/discussion   . Palliative care by specialist   . Acute anemia 09/28/2019  . History of CVA (cerebrovascular accident) 09/28/2019  . Hyperlipidemia LDL goal <70 08/24/2019  . Dysphagia due to recent cerebral infarction 08/24/2019  . Severe protein-calorie malnutrition ([place]) 08/24/2019  . Iron deficiency anemia 08/24/2019  . AKI (acute kidney injury) ([place]) 08/24/2019  . CKD (chronic kidney disease), stage IIIa 08/24/2019  . Alcohol abuse 08/24/2019  .  Obesity 08/24/2019  . Multinodular thyroid 08/24/2019  . Pressure injury of skin 08/13/2019  . Embolic stroke ([place]) L MCA s/p tPA, unknown source 08/10/2019  . Thrombocytosis   . Acute lower UTI   . Labile blood pressure   . Hydrothorax   . Sleep disturbance   . Pleural effusion on left   . Hypertensive crisis   . Hypertension   . Leukocytosis   . Benign essential HTN   . Diabetes mellitus  type 2 in nonobese (HCC)   . Acute blood loss anemia   . Trauma 05/27/2017  . TBI (traumatic brain injury) ([place])   . Essential hypertension   . Chronic diastolic congestive heart failure ([place])   . 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 Rib fractures 05/10/2017  . 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   PAST MEDICAL HISTORY:  Active Ambulatory Problems    Diagnosis Date Noted  . 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  . 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 Rib fractures 05/10/2017  . Trauma 05/27/2017  . TBI (traumatic brain injury) ([place])   . Essential hypertension   . Chronic diastolic congestive heart failure ([place])   . Hypertension   . Leukocytosis   . Benign essential HTN   . Diabetes mellitus type 2 in nonobese (HCC)   . Acute blood loss anemia   . Sleep disturbance   . Pleural effusion on left   . Hypertensive crisis   . Hydrothorax   . Labile blood pressure   . Acute lower UTI   . Thrombocytosis   . Embolic stroke ([place]) L MCA s/p tPA, unknown source 08/10/2019  . Pressure injury of skin 08/13/2019  . Hyperlipidemia LDL goal <70 08/24/2019  . Dysphagia due to recent cerebral infarction 08/24/2019  . Severe protein-calorie malnutrition ([place]) 08/24/2019  . Iron deficiency anemia 08/24/2019  . AKI (acute kidney injury) ([place]) 08/24/2019  . CKD (chronic kidney disease), stage IIIa 08/24/2019  . Alcohol abuse 08/24/2019  . Obesity 08/24/2019  . Multinodular thyroid 08/24/2019  . Acute anemia 09/28/2019  . History of CVA (cerebrovascular accident) 09/28/2019  . Goals of care, counseling/discussion   . Palliative care by specialist   . Pneumonia due to COVID-19 virus 03/16/2020  . Severe sepsis ([place]) 03/16/2020  . Respiratory failure, acute ([place]) 03/16/2020  . Hemiparesis ([place]) 03/16/2020  . Aphasia 03/16/2020   Resolved Ambulatory Problems    Diagnosis Date Noted  . No Resolved Ambulatory Problems   Past Medical History:  Diagnosis Date  .  Angina of effort ([place])   . Chronic diastolic CHF (congestive heart failure) ([place])   . CVA (cerebral vascular accident) ([place]) 08/10/2019  . Diabetes ([place])   . Diabetes mellitus without complication ([place])   . Dilated cardiomyopathy ([place])   . Heart disease   . High blood pressure   . 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 Moderate mitral insufficiency   . Neuropathy   . Sleep apnea    SOCIAL HX:  Social History   Tobacco Use  . Smoking status: Former Smoker    Packs/day: 1.50    Years: 30.00    Pack years: 45.00    Types: Cigarettes  . Smokeless tobacco: Never Used  Substance Use Topics  . Alcohol use: Yes   FAMILY HX:  Family History  Problem Relation Age of Onset  . Hypertension Mother   . Hypertension Other       ALLERGIES: No Known Allergies  PERTINENT MEDICATIONS:  Outpatient Encounter Medications as of 06/12/2020  Medication Sig  . acetaminophen (TYLENOL) 325 MG tablet Take 650 mg by mouth every 8 (eight) hours as needed for mild pain, fever or moderate pain.  Marland Kitchen amLODipine (NORVASC) 10 MG tablet Take 10 mg by mouth daily.  . feeding supplement, ENSURE ENLIVE, (ENSURE ENLIVE) LIQD Take 237 mLs by mouth 3 (three) times daily between meals.  . folic acid (FOLVITE) 268 MCG tablet Take 800 mcg by mouth daily.  Marland Kitchen losartan (COZAAR) 25 MG tablet Take 25 mg by mouth daily.  . Maltodextrin-Xanthan Gum (RESOURCE THICKENUP CLEAR) POWD Take 120 g by mouth as needed (for nectar thick liquids).  . metFORMIN (GLUCOPHAGE) 500 MG tablet Take 1,000 mg by mouth 2 (two) times daily with a meal.  . metoprolol tartrate (LOPRESSOR) 25 MG tablet Take 1 tablet (25 mg total) by mouth 2 (two) times daily.  . pantoprazole (PROTONIX) 40 MG tablet Take 1 tablet (40 mg total) by mouth daily.   No facility-administered encounter medications on file as of 06/12/2020.   ROS  General: NAD ENMT: denies dysphagia Cardiovascular: denies chest pain, denies DOE Pulmonary:  denies cough, denies increased SOB Abdomen: endorses good appetite, denies constipation MSK:  Endorses weakness,  no falls reported Skin: denies rashes or wounds Neurological: denies pain, denies insomnia Psych: Endorses positive mood  Physical Exam: Current and past weights: stable Constitutional:  NAD General: frail appearing, wnwd CV: S1S2, RRR, no LE edema Pulmonary: LCTA, no increased work of breathing, no cough, room air in day, oxygen at 4 L at hs. Abdomen: intake 100%,  no ascites MSK: mild sarcopenia, R hemiparesis,  non - ambulatory Skin: warm and dry, no rashes or wounds on visible skin Neuro:  + generalized weakness,  + cognitive impairment Psych: non-anxious affect, A and O x 2  Thank you for the opportunity to participate in the care of Malik [name].  The palliative care team will continue to follow. Please call our office at [phone] if we can be of additional assistance.   Jason Coop, NP , DNP, MPH, AGPCNP-BC, ACHPN    COVID-19 PATIENT SCREENING TOOL Asked and negative response unless otherwise noted:   Have you had symptoms of covid, tested positive or been in contact with someone with symptoms/positive test in the past 5-10 days?

## 2020-10-02 ENCOUNTER — Non-Acute Institutional Stay: Payer: Medicare Other | Admitting: Primary Care

## 2020-10-02 ENCOUNTER — Other Ambulatory Visit: Payer: Self-pay

## 2020-10-02 DIAGNOSIS — Z515 Encounter for palliative care: Secondary | ICD-10-CM

## 2020-10-02 DIAGNOSIS — R4701 Aphasia: Secondary | ICD-10-CM

## 2020-10-02 DIAGNOSIS — Z8673 Personal history of transient ischemic attack (TIA), and cerebral infarction without residual deficits: Secondary | ICD-10-CM

## 2020-10-02 NOTE — Progress Notes (Signed)
[facility] Palliative Care Consult Note Telephone: [phone]  Fax: [phone]    Date of encounter: 10/02/20 PATIENT NAME: Malik [name] [id number]   [phone] (home)  DOB: 1945-08-07 MRN: [id number] PRIMARY CARE PROVIDER:    Bonnita Nasuti, MD,  [address] [place] [address] [phone]  REFERRING PROVIDER:   Bonnita Nasuti, MD [place],  [place] [address] [phone]  RESPONSIBLE PARTY:    Contact Information     Name Relation Home Work Mobile   Ansley Son [phone]  [phone]   Dorothy Puffer Daughter   [phone]        I met face to face with patient  in [facility]. Palliative Care was asked to follow this patient by consultation request of  Hague, Rosalyn Charters, MD to address advance care planning and complex medical decision making. This is a follow up visit.                                   ASSESSMENT AND PLAN / RECOMMENDATIONS:   Advance Care Planning/Goals of Care: Goals include to maximize quality of life and symptom management. CODE STATUS: Full Code full scope  Symptom Management/Plan:  I met with patient in his nursing home room. Staff states he's been doing very well; he has been walking on his own. They feel he has made great progress. On my arrival he was sitting in a wheelchair. I asked him about walking and he was able to stand by himself and take his cane and show me that he could walk.   He was able to go out yesterday with his daughter and  when he was asked about this he became tearful and cried at the mention of his family. He has significant expressive aphasia but was able to understand what I said as evidenced by physical response. Family visits him and helps with his laundry.   Follow up Palliative Care Visit: Palliative care will continue to follow for complex medical decision making, advance care planning, and clarification  of goals. Return 12 weeks or prn.  I spent 25 minutes providing this consultation. More than 50% of the time in this consultation was spent in counseling and care coordination.  PPS: 40%  HOSPICE ELIGIBILITY/DIAGNOSIS: TBD  Chief Complaint: Aphasia  HISTORY OF PRESENT ILLNESS:  Malik [name] is a 75 y.o. year old male  with CVA and expressive aphasia, R weakness and hand weakness.   History obtained from review of EMR, discussion with primary team, and interview with family, facility staff/caregiver and/or Malik [name].  I reviewed available labs, medications, imaging, studies and related documents from the EMR.  Records reviewed and summarized above.   ROS/staff   General: NAD ENMT: denies dysphagia, endorses aphasia Pulmonary: denies cough, denies increased SOB Abdomen: endorses good appetite, denies constipation, endorses continence of bowel GU: denies dysuria, endorses continence of urine MSK:  endorses weakness,  no falls reported, uses cane and w/c Skin: denies rashes or wounds Neurological: denies pain, denies insomnia Psych: Endorses positive mood Heme/lymph/immuno: denies bruises, abnormal bleeding  Physical Exam: Current and past weights:unavailable Constitutional: NAD General: frail appearing, WNWD EYES: anicteric sclera, lids intact, no discharge  ENMT: intact hearing, oral mucous membranes moist CV:1+ bil LE edema Pulmonary: no increased work of breathing, no cough, room air (oxygen at hs) Abdomen: intake 100%, normo-active BS +  4 quadrants, soft and non tender, no ascites GU: deferred MSK: mild sarcopenia, moves L sided extremities,  R UE paresis, ambulatory with cane Skin: warm and dry, no rashes or wounds on visible skin Neuro:  + generalized weakness,  + cognitive impairment, expressive aphasia Psych: occ anxious affect, tearful at times, A and O x 2 Hem/lymph/immuno: no widespread bruising  Thank you for the opportunity to participate in the care of  Malik [name].  The palliative care team will continue to follow. Please call our office at [phone] if we can be of additional assistance.   Jason Coop, NP   COVID-19 PATIENT SCREENING TOOL Asked and negative response unless otherwise noted:   Have you had symptoms of covid, tested positive or been in contact with someone with symptoms/positive test in the past 5-10 days?

## 2020-10-31 ENCOUNTER — Encounter (HOSPITAL_COMMUNITY): Payer: Self-pay | Admitting: Emergency Medicine

## 2020-10-31 ENCOUNTER — Other Ambulatory Visit: Payer: Self-pay

## 2020-10-31 ENCOUNTER — Emergency Department (HOSPITAL_COMMUNITY): Payer: Medicare Other

## 2020-10-31 ENCOUNTER — Emergency Department (HOSPITAL_COMMUNITY)
Admission: EM | Admit: 2020-10-31 | Discharge: 2020-10-31 | Disposition: A | Discharge status: Home / Self Care | Payer: Medicare Other | Attending: Emergency Medicine | Admitting: Emergency Medicine

## 2020-10-31 DIAGNOSIS — D631 Anemia in chronic kidney disease: Secondary | ICD-10-CM | POA: Insufficient documentation

## 2020-10-31 DIAGNOSIS — Z7984 Long term (current) use of oral hypoglycemic drugs: Secondary | ICD-10-CM | POA: Insufficient documentation

## 2020-10-31 DIAGNOSIS — Z8616 Personal history of COVID-19: Secondary | ICD-10-CM | POA: Diagnosis not present

## 2020-10-31 DIAGNOSIS — R4701 Aphasia: Secondary | ICD-10-CM | POA: Diagnosis not present

## 2020-10-31 DIAGNOSIS — S0181XA Laceration without foreign body of other part of head, initial encounter: Secondary | ICD-10-CM | POA: Insufficient documentation

## 2020-10-31 DIAGNOSIS — W01198A Fall on same level from slipping, tripping and stumbling with subsequent striking against other object, initial encounter: Secondary | ICD-10-CM | POA: Insufficient documentation

## 2020-10-31 DIAGNOSIS — I5032 Chronic diastolic (congestive) heart failure: Secondary | ICD-10-CM | POA: Insufficient documentation

## 2020-10-31 DIAGNOSIS — S7001XA Contusion of right hip, initial encounter: Secondary | ICD-10-CM

## 2020-10-31 DIAGNOSIS — I130 Hypertensive heart and chronic kidney disease with heart failure and stage 1 through stage 4 chronic kidney disease, or unspecified chronic kidney disease: Secondary | ICD-10-CM | POA: Diagnosis not present

## 2020-10-31 DIAGNOSIS — Z87891 Personal history of nicotine dependence: Secondary | ICD-10-CM | POA: Diagnosis not present

## 2020-10-31 DIAGNOSIS — E119 Type 2 diabetes mellitus without complications: Secondary | ICD-10-CM | POA: Diagnosis not present

## 2020-10-31 DIAGNOSIS — S0990XA Unspecified injury of head, initial encounter: Secondary | ICD-10-CM | POA: Diagnosis present

## 2020-10-31 DIAGNOSIS — N1831 Chronic kidney disease, stage 3a: Secondary | ICD-10-CM | POA: Insufficient documentation

## 2020-10-31 DIAGNOSIS — Z79899 Other long term (current) drug therapy: Secondary | ICD-10-CM | POA: Diagnosis not present

## 2020-10-31 DIAGNOSIS — M542 Cervicalgia: Secondary | ICD-10-CM | POA: Insufficient documentation

## 2020-10-31 DIAGNOSIS — W19XXXA Unspecified fall, initial encounter: Secondary | ICD-10-CM

## 2020-10-31 DIAGNOSIS — S161XXA Strain of muscle, fascia and tendon at neck level, initial encounter: Secondary | ICD-10-CM

## 2020-10-31 NOTE — ED Notes (Signed)
Patient transported to CT 

## 2020-10-31 NOTE — ED Notes (Signed)
Attempted call report to [facility], no answer.

## 2020-10-31 NOTE — ED Triage Notes (Signed)
Pt fell going to the restroom and has laceration to the back of head and has some neck tenderness. C-collar placed by ems.

## 2020-10-31 NOTE — Discharge Instructions (Addendum)
Continue medications as previously prescribed.  Return to the emergency department for severe headache, difficulty waking, seizures/convulsions, or other new and concerning symptoms.

## 2020-10-31 NOTE — ED Notes (Signed)
XR at bedside

## 2020-10-31 NOTE — ED Notes (Signed)
Called [facility] [employer] to get in contact with [facility]. They will try to get someone.

## 2020-10-31 NOTE — ED Provider Notes (Signed)
[facility] EMERGENCY DEPARTMENT Provider Note   CSN: [id number] Arrival date & time: 10/31/20  [id number]     History Chief Complaint  Patient presents with   Malik [name]    Malik [name] is a 75 y.o. male.  Patient is a 75 year old male with past medical history of prior CVA with right hemiparesis, CHF, diabetes, hypertension.  Patient sent from his extended care facility for evaluation of fall.  He apparently fell backward and hit the back of his head on the floor.  He has a small laceration to the occiput.  There was no reported loss of consciousness.  Patient has expressive aphasia and cannot express to me what exactly hurts, but I was told that he was reporting neck pain to the staff at his facility.  The history is provided by the patient.      Past Medical History:  Diagnosis Date   Angina of effort ([place])    Chronic diastolic CHF (congestive heart failure) (HCC)    CVA (cerebral vascular accident) ([place]) 08/10/2019   right sided weakness   Diabetes ([place])    Diabetes mellitus without complication ([place])    Dilated cardiomyopathy ([place])    Heart disease    High blood pressure    Hypertension    LVH (left ventricular hypertrophy) due to hypertensive disease    Moderate mitral insufficiency    Neuropathy    with LE weakness   Sleep apnea    TBI (traumatic brain injury) [facility])     Patient Active Problem List   Diagnosis Date Noted   Pneumonia due to COVID-19 virus 03/16/2020   Severe sepsis ([place]) 03/16/2020   Respiratory failure, acute ([place]) 03/16/2020   Hemiparesis ([place]) 03/16/2020   Aphasia 03/16/2020   Goals of care, counseling/discussion    Palliative care by specialist    Acute anemia 09/28/2019   History of CVA (cerebrovascular accident) 09/28/2019   Hyperlipidemia LDL goal <70 08/24/2019   Dysphagia due to recent cerebral infarction 08/24/2019   Severe protein-calorie malnutrition ([place]) 08/24/2019   Iron deficiency anemia 08/24/2019   AKI (acute kidney injury)  ([place]) 08/24/2019   CKD (chronic kidney disease), stage IIIa 08/24/2019   Alcohol abuse 08/24/2019   Obesity 08/24/2019   Multinodular thyroid 08/24/2019   Pressure injury of skin 0000000   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, unknown source 08/10/2019   Thrombocytosis    Acute lower UTI    Labile blood pressure    Hydrothorax    Sleep disturbance    Pleural effusion on left    Hypertensive crisis    Hypertension    Leukocytosis    Benign essential HTN    Diabetes mellitus type 2 in nonobese (HCC)    Acute blood loss anemia    Trauma 05/27/2017   TBI (traumatic brain injury) ([place])    Essential hypertension    Chronic diastolic congestive heart failure ([place])    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   Rib fractures 05/10/2017   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    Past Surgical History:  Procedure Laterality Date   BACK SURGERY     ESOPHAGOGASTRODUODENOSCOPY (EGD) WITH PROPOFOL N/A 10/02/2019   Procedure: ESOPHAGOGASTRODUODENOSCOPY (EGD) WITH PROPOFOL;  Surgeon: Irene Shipper, MD;  Location: [facility] ENDOSCOPY;  Service: Endoscopy;  Laterality: N/A;   IR CT HEAD LTD  08/10/2019   IR PERCUTANEOUS ART THROMBECTOMY/INFUSION INTRACRANIAL INC DIAG ANGIO  08/10/2019   RADIOLOGY WITH ANESTHESIA N/A 08/10/2019   Procedure: IR WITH ANESTHESIA -CODE STROKE;  Surgeon: Radiologist, Medication, MD;  Location: [facility];  Service: Radiology;  Laterality: N/A;   SPLENECTOMY, TOTAL N/A 05/10/2017   Procedure: TRAUMA EXPLORATORY LAP FOR SPLENECTOMY;  Surgeon: Clovis Riley, MD;  Location: [facility] OR;  Service: General;  Laterality: N/A;       Family History  Problem Relation Age of Onset   Hypertension Mother    Hypertension Other     Social History   Tobacco Use   Smoking status: Former    Packs/day: 1.50    Years: 30.00    Pack years: 45.00    Types: Cigarettes   Smokeless tobacco: Never  Vaping Use   Vaping Use: Never used  Substance Use Topics   Alcohol use: Yes   Drug use: No     Home Medications Prior to Admission medications   Medication Sig Start Date End Date Taking? Authorizing Provider  acetaminophen (TYLENOL) 325 MG tablet Take 650 mg by mouth every 8 (eight) hours as needed for mild pain, fever or moderate pain.    [provider]  amLODipine (NORVASC) 10 MG tablet Take 10 mg by mouth daily.    [provider]  feeding supplement, ENSURE ENLIVE, (ENSURE ENLIVE) LIQD Take 237 mLs by mouth 3 (three) times daily between meals. 08/24/19   Donzetta Starch, NP  folic acid (FOLVITE) [id number] MCG tablet Take 800 mcg by mouth daily.    [provider]  losartan (COZAAR) 25 MG tablet Take 25 mg by mouth daily. 02/14/20   [provider]  Maltodextrin-Xanthan Gum (RESOURCE THICKENUP CLEAR) POWD Take 120 g by mouth as needed (for nectar thick liquids). 08/24/19   Donzetta Starch, NP  metFORMIN (GLUCOPHAGE) 500 MG tablet Take 1,000 mg by mouth 2 (two) times daily with a meal.    [provider]  metoprolol tartrate (LOPRESSOR) 25 MG tablet Take 1 tablet (25 mg total) by mouth 2 (two) times daily. 08/24/19   Donzetta Starch, NP  pantoprazole (PROTONIX) 40 MG tablet Take 1 tablet (40 mg total) by mouth daily. 06/07/17   Bary Leriche, PA-C    Allergies    Patient has no known allergies.  Review of Systems   Review of Systems  Unable to perform ROS: Other   Physical Exam Updated Vital Signs BP (!) 153/70   Pulse 62   Temp 98.4 F (36.9 C) (Oral)   Resp (!) 22   Ht '5\' 10"'$  (1.778 m)   Wt 83 kg   SpO2 96%   BMI 26.26 kg/m   Physical Exam Vitals and nursing note reviewed.  Constitutional:      General: He is not in acute distress.    Appearance: He is well-developed. He is not diaphoretic.  HENT:     Head: Normocephalic.     Comments: There is a small, less than 1 cm laceration to the occiput.  Bleeding is controlled. Eyes:     Extraocular Movements: Extraocular movements intact.     Pupils: Pupils are equal, round, and  reactive to light.  Cardiovascular:     Rate and Rhythm: Normal rate and regular rhythm.     Heart sounds: No murmur heard.   No friction rub.  Pulmonary:     Effort: Pulmonary effort is normal. No respiratory distress.     Breath sounds: Normal breath sounds. No wheezing or rales.  Abdominal:     General: Bowel sounds are normal. There is no distension.     Palpations: Abdomen is soft.  Tenderness: There is no abdominal tenderness.  Musculoskeletal:        General: Normal range of motion.     Cervical back: Normal range of motion and neck supple.     Comments: Exam limited secondary to expressive aphasia, but he appears to have discomfort to the right hip.  He is able to lift the leg off the bed, however reports tenderness when I palpate the lateral aspect.  Skin:    General: Skin is warm and dry.  Neurological:     Mental Status: He is alert.     Coordination: Coordination normal.     Comments: Patient is awake and alert.  He attempts to respond, however has limited communication secondary to prior stroke with expressive aphasia.  He has a right-sided hemiparesis noted which is his baseline.    ED Results / Procedures / Treatments   Labs (all labs ordered are listed, but only abnormal results are displayed) Labs Reviewed - No data to display  EKG None  Radiology No results found.  Procedures Procedures   Medications Ordered in ED Medications - No data to display  ED Course  I have reviewed the triage vital signs and the nursing notes.  Pertinent labs & imaging results that were available during my care of the patient were reviewed by me and considered in my medical decision making (see chart for details).    MDM Rules/Calculators/A&P  CT scan of the head and cervical spine are unremarkable.  X-rays of the right hip show no fracture.  The scalp wound is superficial and not in need of repair.  Patient to be discharged back to his facility with return as  needed.  Final Clinical Impression(s) / ED Diagnoses Final diagnoses:  None    Rx / DC Orders ED Discharge Orders     None        Veryl Speak, MD 10/31/20 [id number]

## 2020-10-31 NOTE — ED Notes (Signed)
Attempted report x2 to [facility] at [phone], no answer

## 2021-01-16 ENCOUNTER — Telehealth: Payer: Self-pay | Admitting: Primary Care

## 2021-01-16 NOTE — Telephone Encounter (Signed)
Spoke with daughter Davy Pique, and have scheduled an In-home Palliative f/u visit for 01/23/21 @ 12:30 PM.  Patient was just discharged from [facility] on 01/12/21.

## 2021-01-22 ENCOUNTER — Non-Acute Institutional Stay: Payer: Medicare Other | Admitting: Primary Care

## 2021-01-23 ENCOUNTER — Other Ambulatory Visit: Payer: Self-pay

## 2021-01-23 ENCOUNTER — Other Ambulatory Visit: Payer: Medicare Other | Admitting: Primary Care

## 2021-01-23 DIAGNOSIS — Z515 Encounter for palliative care: Secondary | ICD-10-CM

## 2021-01-23 DIAGNOSIS — Z8673 Personal history of transient ischemic attack (TIA), and cerebral infarction without residual deficits: Secondary | ICD-10-CM

## 2021-01-23 DIAGNOSIS — R4701 Aphasia: Secondary | ICD-10-CM

## 2021-01-23 NOTE — Progress Notes (Signed)
[facility] Palliative Care Consult Note Telephone: [phone]  Fax: [phone]    Date of encounter: 01/23/21 3:48 PM PATIENT NAME: Malik [name] [address] [place] [address]   [phone] (home)  DOB: 1945/08/09 MRN: [id number] PRIMARY CARE PROVIDER:    Donnie Coffin, MD,  [place] [address] [phone]  REFERRING PROVIDER:   Donnie Coffin, MD,  [address] [place] [address] [phone]  RESPONSIBLE PARTY:    Contact Information     Name Relation Home Work Mobile   Malik [name] [phone]  [phone]   Malik [name] Daughter   [phone]       I met face to face with patient and family in  home. Palliative Care was asked to follow this patient by consultation request of  Aycock, Edmonia Lynch, MD to address advance care planning and complex medical decision making. This is a follow up visit.                                   ASSESSMENT AND PLAN / RECOMMENDATIONS:   Advance Care Planning/Goals of Care: Goals include to maximize quality of life and symptom management. Our advance care planning conversation included a discussion about:    Exploration of personal, cultural or spiritual beliefs that might influence medical decisions  Identification  of a healthcare agent - [name] Review  of an  advance directive document . CODE STATUS: FULL CODE Goals of care Patient had, while in the nursing home, repeatedly stated he wanted to go home. He is visibly relaxed to be at home now.  Power of attorney is not present, this has been [name] Scientist, product/process development. I left a MOST   form with grandson Malik [name] and will discuss with power of attorney once they can review and discuss their advance care plan. I have placed call to daughter, who was noted to have been planning to attend our meeting today but who was not present.  Symptom Management/Plan:  I met with patient and his  grandson Malik [name] in patient's home. He has relocated to a one bedroom apartment. Family is nearby and checked on him several times a day. His [name] comes in the beginning of the day and at the end for medication management  and he has a grandson who's there today getting him lunch; his daughter is also a caregiver. He is able to do many ADLs on his own such as toilet and mobilize in his wheelchair in the home.   Mobility he is home after a 10 month nursing home stay. He currently has home health although  grandson is not aware of the name of the company. I left my cards for home health  as well. They have been out one time, since he has only been home since Friday.   Nutrition patient states a good appetite he looks well nourished and well developed. He had a history of malnutrition but his dysphasia appears to be controlled . Speech evaluation at home is recommended from the home health agency.   Continuity:  His PCP is  through [facility] clinic , Dr. Clide Deutscher. He's also followed by Dr. Nehemiah Massed of cardiology. I asked grandson to make both follow-up appointments.   Follow up Palliative Care Visit: Palliative care will continue to follow for complex medical decision making, advance care planning, and clarification of  goals. Return 6-8 weeks or prn.  I spent 40 minutes providing this consultation. More than 50% of the time in this consultation was spent in counseling and care coordination.  PPS: 40%  HOSPICE ELIGIBILITY/DIAGNOSIS: no  Chief Complaint: debility  HISTORY OF PRESENT ILLNESS:  Malik [name] is a 75 y.o. year old male  with CVA deficits, aphasia, debility, immobility.  History obtained from review of EMR, discussion with primary team, and interview with family, facility staff/caregiver and/or Malik [name].  I reviewed available labs, medications, imaging, studies and related documents from the EMR.  Records reviewed and summarized above.   ROS  General: NAD ENMT: denies  dysphagia Cardiovascular: denies chest pain, denies DOE Pulmonary: denies cough, denies increased SOB Abdomen: endorses good appetite, denies constipation, endorses continence of bowel GU: denies dysuria, endorses continence of urine MSK:  denies weakness,  no falls reported Skin: denies rashes or wounds Neurological: denies pain, denies insomnia Psych: Endorses positive mood Heme/lymph/immuno: denies bruises, abnormal bleeding  Physical Exam: Current and past weights: stable  Constitutional: NAD General: frail appearing EYES: anicteric sclera, lids intact, no discharge  ENMT: intact hearing, oral mucous membranes moist, dentition intact CV:  1+ LE edema Pulmonary:  no increased work of breathing, no cough, room air Abdomen: intake 100%,no ascites MSK: mild  sarcopenia, moves all extremities,  non ambulatory Skin: warm and dry, no rashes or wounds on visible skin Neuro:  + generalized weakness,  mild cognitive impairment Psych: non-anxious affect, A and O x 3 Hem/lymph/immuno: no widespread bruising  Thank you for the opportunity to participate in the care of Malik [name].  The palliative care team will continue to follow. Please call our office at [phone] if we can be of additional assistance.   Jason Coop, NP DNP, AGPCNP-BC  COVID-19 PATIENT SCREENING TOOL Asked and negative response unless otherwise noted:   Have you had symptoms of covid, tested positive or been in contact with someone with symptoms/positive test in the past 5-10 days?

## 2021-02-08 ENCOUNTER — Emergency Department: Payer: Medicare Other

## 2021-02-08 ENCOUNTER — Emergency Department
Admission: EM | Admit: 2021-02-08 | Discharge: 2021-02-08 | Disposition: A | Discharge status: Home / Self Care | Payer: Medicare Other | Source: Home / Self Care | Attending: Emergency Medicine | Admitting: Emergency Medicine

## 2021-02-08 ENCOUNTER — Other Ambulatory Visit: Payer: Self-pay

## 2021-02-08 DIAGNOSIS — Z5321 Procedure and treatment not carried out due to patient leaving prior to being seen by health care provider: Secondary | ICD-10-CM | POA: Insufficient documentation

## 2021-02-08 DIAGNOSIS — F0390 Unspecified dementia without behavioral disturbance: Secondary | ICD-10-CM | POA: Insufficient documentation

## 2021-02-08 DIAGNOSIS — S065XAA Traumatic subdural hemorrhage with loss of consciousness status unknown, initial encounter: Secondary | ICD-10-CM | POA: Diagnosis not present

## 2021-02-08 DIAGNOSIS — R0602 Shortness of breath: Secondary | ICD-10-CM | POA: Insufficient documentation

## 2021-02-08 LAB — BASIC METABOLIC PANEL
Anion gap: 6 (ref 5–15)
BUN: 22 mg/dL (ref 8–23)
CO2: 24 mmol/L (ref 22–32)
Calcium: 8.8 mg/dL — ABNORMAL LOW (ref 8.9–10.3)
Chloride: 108 mmol/L (ref 98–111)
Creatinine, Ser: 1.05 mg/dL (ref 0.61–1.24)
GFR, Estimated: >60 mL/min (ref >60)
Glucose, Bld: 134 mg/dL — ABNORMAL HIGH (ref 70–99)
Potassium: 3.8 mmol/L (ref 3.5–5.1)
Sodium: 138 mmol/L (ref 135–145)

## 2021-02-08 LAB — CBC
HCT: 24.9 % — ABNORMAL LOW (ref 39.0–52.0)
Hemoglobin: 7.3 g/dL — ABNORMAL LOW (ref 13.0–17.0)
MCH: 20.2 pg — ABNORMAL LOW (ref 26.0–34.0)
MCHC: 29.3 g/dL — ABNORMAL LOW (ref 30.0–36.0)
MCV: 69 fL — ABNORMAL LOW (ref 80.0–100.0)
Platelets: 541 10*3/uL — ABNORMAL HIGH (ref 150–400)
RBC: 3.61 MIL/uL — ABNORMAL LOW (ref 4.22–5.81)
RDW: 20.5 % — ABNORMAL HIGH (ref 11.5–15.5)
WBC: 11.9 10*3/uL — ABNORMAL HIGH (ref 4.0–10.5)
nRBC: 0.6 % — ABNORMAL HIGH (ref 0.0–0.2)

## 2021-02-08 LAB — TROPONIN I (HIGH SENSITIVITY)
Troponin I (High Sensitivity): 28 ng/L — ABNORMAL HIGH (ref <18)
Troponin I (High Sensitivity): 32 ng/L — ABNORMAL HIGH (ref <18)

## 2021-02-08 NOTE — ED Provider Notes (Signed)
Emergency Medicine Provider Triage Evaluation Note  Malik [name], a 75 y.o. male  was evaluated in triage.  Pt complains of SOB, as reported by his family.  Patient is on 3 L of O2 chronically, and presents to the ED by EMS from home.  Patient's family wanted him evaluated.  Patient does not endorse any complaints at this time.  He is on percent on room air at time of this evaluation  Review of Systems  Positive: Dementia, SOB Negative: CP, syncope  Physical Exam  BP (!) 154/76   Pulse 69   Temp 98.2 F (36.8 C) (Oral)   Resp 16   Ht 5\' 10"  (1.778 m)   Wt 83 kg   SpO2 100%   BMI 26.26 kg/m  Gen:   Awake, no distress  NAD Resp:  Normal effort CTA MSK:   Moves extremities without difficulty  Other:  CVS: RRR  Medical Decision Making  Medically screening exam initiated at 2:59 PM.  Appropriate orders placed.  Malik [name] was informed that the remainder of the evaluation will be completed by another provider, this initial triage assessment does not replace that evaluation, and the importance of remaining in the ED until their evaluation is complete.  Geriatric patient presents from home for evaluation of shortness of breath at the request of his family.  Patient with a baseline of dementia and 3 L of O2 presents in no acute respiratory distress.  He does not endorse any complaints at this time.   Melvenia Needles, PA-C 02/08/21 1503    Duffy Bruce, MD 02/08/21 2207

## 2021-02-08 NOTE — ED Triage Notes (Signed)
Pt brought in by [facility] with c/o of breathing difficulties. Pt denies any symptoms he has hx of dementia. Family wanted him to be evaluated. VSS per EMS.

## 2021-02-08 NOTE — ED Notes (Signed)
No answer when called several times from lobby 

## 2021-02-08 NOTE — ED Notes (Signed)
Pts son Brayant Dorr the third wants to be called when pt is ready [phone].

## 2021-02-08 NOTE — ED Notes (Signed)
No answer when called several times from lobby; no answer when phone # listed in chart called 

## 2021-02-08 NOTE — ED Notes (Signed)
Pts Daughter is Shonya [phone].

## 2021-02-09 ENCOUNTER — Other Ambulatory Visit: Payer: Self-pay

## 2021-02-09 ENCOUNTER — Encounter (HOSPITAL_COMMUNITY): Payer: Self-pay | Admitting: Emergency Medicine

## 2021-02-09 ENCOUNTER — Emergency Department (HOSPITAL_COMMUNITY)
Admission: EM | Admit: 2021-02-09 | Discharge: 2021-02-09 | Disposition: A | Discharge status: Home / Self Care | Payer: Medicare Other | Attending: Student | Admitting: Student

## 2021-02-09 DIAGNOSIS — J029 Acute pharyngitis, unspecified: Secondary | ICD-10-CM | POA: Diagnosis present

## 2021-02-09 DIAGNOSIS — Z5321 Procedure and treatment not carried out due to patient leaving prior to being seen by health care provider: Secondary | ICD-10-CM | POA: Diagnosis not present

## 2021-02-09 DIAGNOSIS — Z20822 Contact with and (suspected) exposure to covid-19: Secondary | ICD-10-CM | POA: Diagnosis not present

## 2021-02-09 LAB — BASIC METABOLIC PANEL
Anion gap: 9 (ref 5–15)
BUN: 20 mg/dL (ref 8–23)
CO2: 21 mmol/L — ABNORMAL LOW (ref 22–32)
Calcium: 9.3 mg/dL (ref 8.9–10.3)
Chloride: 109 mmol/L (ref 98–111)
Creatinine, Ser: 1.13 mg/dL (ref 0.61–1.24)
GFR, Estimated: >60 mL/min (ref >60)
Glucose, Bld: 137 mg/dL — ABNORMAL HIGH (ref 70–99)
Potassium: 3.9 mmol/L (ref 3.5–5.1)
Sodium: 139 mmol/L (ref 135–145)

## 2021-02-09 LAB — CBC WITH DIFFERENTIAL/PLATELET
Abs Immature Granulocytes: 0.07 10*3/uL (ref 0.00–0.07)
Basophils Absolute: 0.1 10*3/uL (ref 0.0–0.1)
Basophils Relative: 1 %
Eosinophils Absolute: 0.4 10*3/uL (ref 0.0–0.5)
Eosinophils Relative: 3 %
HCT: 26.6 % — ABNORMAL LOW (ref 39.0–52.0)
Hemoglobin: 7.5 g/dL — ABNORMAL LOW (ref 13.0–17.0)
Immature Granulocytes: 1 %
Lymphocytes Relative: 26 %
Lymphs Abs: 3.6 10*3/uL (ref 0.7–4.0)
MCH: 20 pg — ABNORMAL LOW (ref 26.0–34.0)
MCHC: 28.2 g/dL — ABNORMAL LOW (ref 30.0–36.0)
MCV: 70.9 fL — ABNORMAL LOW (ref 80.0–100.0)
Monocytes Absolute: 1.3 10*3/uL — ABNORMAL HIGH (ref 0.1–1.0)
Monocytes Relative: 9 %
Neutro Abs: 8.5 10*3/uL — ABNORMAL HIGH (ref 1.7–7.7)
Neutrophils Relative %: 60 %
Platelets: 536 10*3/uL — ABNORMAL HIGH (ref 150–400)
RBC: 3.75 MIL/uL — ABNORMAL LOW (ref 4.22–5.81)
RDW: 21.5 % — ABNORMAL HIGH (ref 11.5–15.5)
WBC: 14 10*3/uL — ABNORMAL HIGH (ref 4.0–10.5)
nRBC: 0.5 % — ABNORMAL HIGH (ref 0.0–0.2)

## 2021-02-09 LAB — RESP PANEL BY RT-PCR (FLU A&B, COVID) ARPGX2
Influenza A by PCR: NEGATIVE
Influenza B by PCR: NEGATIVE
SARS Coronavirus 2 by RT PCR: NEGATIVE

## 2021-02-09 LAB — LIPASE, BLOOD: Lipase: 59 U/L — ABNORMAL HIGH (ref 11–51)

## 2021-02-09 NOTE — ED Provider Notes (Signed)
Emergency Medicine Provider Triage Evaluation Note  Malik [name] , a 75 y.o. male  was evaluated in triage.  Patient brought in by son who went to check on patient today and found him on the ground on his knees.  He initially states that the father had swelling to his "thyroid."  Also notes that patient had an episode of emesis earlier today.  He endorses intermittent coughing which is new for him.  States that he was normal yesterday.  Denies any known fevers, shortness of breath, diarrhea.  States that he is eating and drinking as normal.  States that he is having normal urination and bowel movements.  The patient has a history of stroke and is nonverbal at baseline.  Son states that his mentation is at baseline at this time.  Review of Systems  Positive: See above Negative:   Physical Exam  BP (!) 178/83 (BP Location: Left Arm)   Pulse 85   Temp 98.8 F (37.1 C)   Resp 16   SpO2 100%  Gen:   Awake, patient intermittently groans Resp:  Normal effort  MSK:   Moves extremities without difficulty  Other:  There is no thyromegaly on exam.  He does have tenderness to palpation of his throat so question whether this is sore throat.  There is no swelling of the neck.  There is no parotid swelling.  There is no cervical adenopathy.  Oropharynx is clear without exudates.  Medical Decision Making  Medically screening exam initiated at 6:16 PM.  Appropriate orders placed.  Malikiah Debarr was informed that the remainder of the evaluation will be completed by another provider, this initial triage assessment does not replace that evaluation, and the importance of remaining in the ED until their evaluation is complete.     Mickie Hillier, PA-C 02/09/21 1821    Charlesetta Shanks, MD 02/09/21 [phone]

## 2021-02-09 NOTE — ED Notes (Signed)
Pt did not want to wait and left. 

## 2021-02-09 NOTE — ED Triage Notes (Signed)
Pt's family said they noticed his father had swelling to his "thyroid" when he got home today.  Hx of same per son. Pt is non verbal after a stroke previously per son.  Mentation is normal for patient. Did have one episode of emesis earlier today.

## 2021-02-11 ENCOUNTER — Emergency Department: Payer: Medicare Other

## 2021-02-11 ENCOUNTER — Other Ambulatory Visit: Payer: Self-pay

## 2021-02-11 ENCOUNTER — Inpatient Hospital Stay
Admission: EM | Admit: 2021-02-11 | Discharge: 2021-02-16 | DRG: 082 | Disposition: A | Discharge status: Hospice | Payer: Medicare Other | Attending: Internal Medicine | Admitting: Internal Medicine

## 2021-02-11 DIAGNOSIS — Y92009 Unspecified place in unspecified non-institutional (private) residence as the place of occurrence of the external cause: Secondary | ICD-10-CM | POA: Diagnosis not present

## 2021-02-11 DIAGNOSIS — I69320 Aphasia following cerebral infarction: Secondary | ICD-10-CM

## 2021-02-11 DIAGNOSIS — R1310 Dysphagia, unspecified: Secondary | ICD-10-CM

## 2021-02-11 DIAGNOSIS — D649 Anemia, unspecified: Secondary | ICD-10-CM

## 2021-02-11 DIAGNOSIS — E785 Hyperlipidemia, unspecified: Secondary | ICD-10-CM | POA: Diagnosis present

## 2021-02-11 DIAGNOSIS — N189 Chronic kidney disease, unspecified: Secondary | ICD-10-CM | POA: Diagnosis present

## 2021-02-11 DIAGNOSIS — J9601 Acute respiratory failure with hypoxia: Secondary | ICD-10-CM | POA: Diagnosis present

## 2021-02-11 DIAGNOSIS — I130 Hypertensive heart and chronic kidney disease with heart failure and stage 1 through stage 4 chronic kidney disease, or unspecified chronic kidney disease: Secondary | ICD-10-CM | POA: Diagnosis present

## 2021-02-11 DIAGNOSIS — E669 Obesity, unspecified: Secondary | ICD-10-CM | POA: Diagnosis present

## 2021-02-11 DIAGNOSIS — G9341 Metabolic encephalopathy: Secondary | ICD-10-CM | POA: Diagnosis present

## 2021-02-11 DIAGNOSIS — F1010 Alcohol abuse, uncomplicated: Secondary | ICD-10-CM | POA: Diagnosis present

## 2021-02-11 DIAGNOSIS — E1122 Type 2 diabetes mellitus with diabetic chronic kidney disease: Secondary | ICD-10-CM | POA: Diagnosis present

## 2021-02-11 DIAGNOSIS — N1832 Chronic kidney disease, stage 3b: Secondary | ICD-10-CM | POA: Diagnosis not present

## 2021-02-11 DIAGNOSIS — I5032 Chronic diastolic (congestive) heart failure: Secondary | ICD-10-CM | POA: Diagnosis present

## 2021-02-11 DIAGNOSIS — I420 Dilated cardiomyopathy: Secondary | ICD-10-CM | POA: Diagnosis present

## 2021-02-11 DIAGNOSIS — S069XAA Unspecified intracranial injury with loss of consciousness status unknown, initial encounter: Secondary | ICD-10-CM | POA: Diagnosis present

## 2021-02-11 DIAGNOSIS — S065XAA Traumatic subdural hemorrhage with loss of consciousness status unknown, initial encounter: Secondary | ICD-10-CM | POA: Diagnosis present

## 2021-02-11 DIAGNOSIS — I6200 Nontraumatic subdural hemorrhage, unspecified: Secondary | ICD-10-CM

## 2021-02-11 DIAGNOSIS — E042 Nontoxic multinodular goiter: Secondary | ICD-10-CM | POA: Diagnosis present

## 2021-02-11 DIAGNOSIS — J690 Pneumonitis due to inhalation of food and vomit: Secondary | ICD-10-CM | POA: Diagnosis present

## 2021-02-11 DIAGNOSIS — Z87891 Personal history of nicotine dependence: Secondary | ICD-10-CM

## 2021-02-11 DIAGNOSIS — D631 Anemia in chronic kidney disease: Secondary | ICD-10-CM | POA: Diagnosis present

## 2021-02-11 DIAGNOSIS — J029 Acute pharyngitis, unspecified: Secondary | ICD-10-CM | POA: Diagnosis not present

## 2021-02-11 DIAGNOSIS — I69351 Hemiplegia and hemiparesis following cerebral infarction affecting right dominant side: Secondary | ICD-10-CM

## 2021-02-11 DIAGNOSIS — D509 Iron deficiency anemia, unspecified: Secondary | ICD-10-CM | POA: Diagnosis present

## 2021-02-11 DIAGNOSIS — I2699 Other pulmonary embolism without acute cor pulmonale: Secondary | ICD-10-CM | POA: Diagnosis present

## 2021-02-11 DIAGNOSIS — N1831 Chronic kidney disease, stage 3a: Secondary | ICD-10-CM | POA: Diagnosis present

## 2021-02-11 DIAGNOSIS — G4730 Sleep apnea, unspecified: Secondary | ICD-10-CM | POA: Diagnosis present

## 2021-02-11 DIAGNOSIS — I10 Essential (primary) hypertension: Secondary | ICD-10-CM | POA: Diagnosis present

## 2021-02-11 DIAGNOSIS — Z515 Encounter for palliative care: Secondary | ICD-10-CM | POA: Diagnosis not present

## 2021-02-11 DIAGNOSIS — R4182 Altered mental status, unspecified: Secondary | ICD-10-CM

## 2021-02-11 DIAGNOSIS — F0390 Unspecified dementia without behavioral disturbance: Secondary | ICD-10-CM | POA: Diagnosis present

## 2021-02-11 DIAGNOSIS — I2694 Multiple subsegmental pulmonary emboli without acute cor pulmonale: Secondary | ICD-10-CM | POA: Diagnosis not present

## 2021-02-11 DIAGNOSIS — Z20822 Contact with and (suspected) exposure to covid-19: Secondary | ICD-10-CM | POA: Diagnosis present

## 2021-02-11 DIAGNOSIS — H409 Unspecified glaucoma: Secondary | ICD-10-CM | POA: Diagnosis present

## 2021-02-11 DIAGNOSIS — W06XXXA Fall from bed, initial encounter: Secondary | ICD-10-CM | POA: Diagnosis present

## 2021-02-11 DIAGNOSIS — E43 Unspecified severe protein-calorie malnutrition: Secondary | ICD-10-CM | POA: Diagnosis present

## 2021-02-11 DIAGNOSIS — Z8249 Family history of ischemic heart disease and other diseases of the circulatory system: Secondary | ICD-10-CM

## 2021-02-11 DIAGNOSIS — G629 Polyneuropathy, unspecified: Secondary | ICD-10-CM | POA: Diagnosis present

## 2021-02-11 DIAGNOSIS — E079 Disorder of thyroid, unspecified: Secondary | ICD-10-CM | POA: Diagnosis not present

## 2021-02-11 DIAGNOSIS — Z7984 Long term (current) use of oral hypoglycemic drugs: Secondary | ICD-10-CM

## 2021-02-11 DIAGNOSIS — Z79899 Other long term (current) drug therapy: Secondary | ICD-10-CM

## 2021-02-11 DIAGNOSIS — K219 Gastro-esophageal reflux disease without esophagitis: Secondary | ICD-10-CM | POA: Diagnosis present

## 2021-02-11 DIAGNOSIS — Z6827 Body mass index (BMI) 27.0-27.9, adult: Secondary | ICD-10-CM

## 2021-02-11 DIAGNOSIS — D72829 Elevated white blood cell count, unspecified: Secondary | ICD-10-CM | POA: Diagnosis present

## 2021-02-11 DIAGNOSIS — I2609 Other pulmonary embolism with acute cor pulmonale: Secondary | ICD-10-CM | POA: Diagnosis not present

## 2021-02-11 LAB — BLOOD GAS, VENOUS
Acid-Base Excess: 1.7 mmol/L (ref 0.0–2.0)
Bicarbonate: 25.3 mmol/L (ref 20.0–28.0)
O2 Saturation: 81.6 %
Patient temperature: 37
pCO2, Ven: 34 mmHg — ABNORMAL LOW (ref 44.0–60.0)
pH, Ven: 7.48 — ABNORMAL HIGH (ref 7.250–7.430)
pO2, Ven: 42 mmHg (ref 32.0–45.0)

## 2021-02-11 LAB — URINALYSIS, COMPLETE (UACMP) WITH MICROSCOPIC
Bacteria, UA: NONE SEEN
Bilirubin Urine: NEGATIVE
Glucose, UA: NEGATIVE mg/dL
Hgb urine dipstick: NEGATIVE
Ketones, ur: NEGATIVE mg/dL
Leukocytes,Ua: NEGATIVE
Nitrite: NEGATIVE
Protein, ur: NEGATIVE mg/dL
Specific Gravity, Urine: 1.015 (ref 1.005–1.030)
pH: 7 (ref 5.0–8.0)

## 2021-02-11 LAB — RESP PANEL BY RT-PCR (FLU A&B, COVID) ARPGX2
Influenza A by PCR: NEGATIVE
Influenza B by PCR: NEGATIVE
SARS Coronavirus 2 by RT PCR: NEGATIVE

## 2021-02-11 LAB — BRAIN NATRIURETIC PEPTIDE: B Natriuretic Peptide: 148.7 pg/mL — ABNORMAL HIGH (ref 0.0–100.0)

## 2021-02-11 LAB — T4, FREE: Free T4: 0.8 ng/dL (ref 0.61–1.12)

## 2021-02-11 LAB — COMPREHENSIVE METABOLIC PANEL
ALT: 27 U/L (ref 0–44)
AST: 23 U/L (ref 15–41)
Albumin: 3.6 g/dL (ref 3.5–5.0)
Alkaline Phosphatase: 97 U/L (ref 38–126)
Anion gap: 5 (ref 5–15)
BUN: 20 mg/dL (ref 8–23)
CO2: 24 mmol/L (ref 22–32)
Calcium: 8.8 mg/dL — ABNORMAL LOW (ref 8.9–10.3)
Chloride: 112 mmol/L — ABNORMAL HIGH (ref 98–111)
Creatinine, Ser: 1.21 mg/dL (ref 0.61–1.24)
GFR, Estimated: >60 mL/min (ref >60)
Glucose, Bld: 132 mg/dL — ABNORMAL HIGH (ref 70–99)
Potassium: 3.8 mmol/L (ref 3.5–5.1)
Sodium: 141 mmol/L (ref 135–145)
Total Bilirubin: 0.6 mg/dL (ref 0.3–1.2)
Total Protein: 7.8 g/dL (ref 6.5–8.1)

## 2021-02-11 LAB — CBC
HCT: 25.1 % — ABNORMAL LOW (ref 39.0–52.0)
Hemoglobin: 7.3 g/dL — ABNORMAL LOW (ref 13.0–17.0)
MCH: 19.9 pg — ABNORMAL LOW (ref 26.0–34.0)
MCHC: 29.1 g/dL — ABNORMAL LOW (ref 30.0–36.0)
MCV: 68.4 fL — ABNORMAL LOW (ref 80.0–100.0)
Platelets: 530 10*3/uL — ABNORMAL HIGH (ref 150–400)
RBC: 3.67 MIL/uL — ABNORMAL LOW (ref 4.22–5.81)
RDW: 20.8 % — ABNORMAL HIGH (ref 11.5–15.5)
WBC: 17.8 10*3/uL — ABNORMAL HIGH (ref 4.0–10.5)
nRBC: 0.2 % (ref 0.0–0.2)

## 2021-02-11 LAB — TSH: TSH: 0.697 u[IU]/mL (ref 0.350–4.500)

## 2021-02-11 LAB — LACTIC ACID, PLASMA
Lactic Acid, Venous: 1.4 mmol/L (ref 0.5–1.9)
Lactic Acid, Venous: 1.4 mmol/L (ref 0.5–1.9)

## 2021-02-11 LAB — GLUCOSE, CAPILLARY: Glucose-Capillary: 150 mg/dL — ABNORMAL HIGH (ref 70–99)

## 2021-02-11 LAB — PROCALCITONIN: Procalcitonin: 1.13 ng/mL

## 2021-02-11 LAB — PROTIME-INR
INR: 1 (ref 0.8–1.2)
Prothrombin Time: 13.4 seconds (ref 11.4–15.2)

## 2021-02-11 MED ORDER — ONDANSETRON HCL 4 MG PO TABS: 4.0000 mg | ORAL_TABLET | Freq: Four times a day (QID) | ORAL | Status: DC | PRN

## 2021-02-11 MED ORDER — SODIUM CHLORIDE 0.9 % IV SOLN
500.0000 mg | INTRAVENOUS | Status: AC
  Administered 2021-02-12 – 2021-02-15 (×5): 500 mg via INTRAVENOUS
  Filled 2021-02-11 (×6): qty 500

## 2021-02-11 MED ORDER — IOHEXOL 350 MG/ML SOLN
100.0000 mL | Freq: Once | INTRAVENOUS | Status: AC | PRN
  Administered 2021-02-11: 20:00:00 100 mL via INTRAVENOUS

## 2021-02-11 MED ORDER — SODIUM CHLORIDE 0.9 % IV SOLN
2.0000 g | Freq: Once | INTRAVENOUS | Status: DC
  Filled 2021-02-11: qty 20

## 2021-02-11 MED ORDER — ONDANSETRON HCL 4 MG/2ML IJ SOLN: 4.0000 mg | Freq: Four times a day (QID) | INTRAMUSCULAR | Status: DC | PRN

## 2021-02-11 MED ORDER — SODIUM CHLORIDE 0.9 % IV SOLN: 1.0000 g | Freq: Once | INTRAVENOUS | Status: DC

## 2021-02-11 MED ORDER — MORPHINE SULFATE (PF) 2 MG/ML IV SOLN: 2.0000 mg | INTRAVENOUS | Status: DC | PRN

## 2021-02-11 MED ORDER — ACETAMINOPHEN 650 MG RE SUPP: 650.0000 mg | Freq: Four times a day (QID) | RECTAL | Status: AC | PRN

## 2021-02-11 MED ORDER — FENTANYL CITRATE PF 50 MCG/ML IJ SOSY: 50.0000 ug | PREFILLED_SYRINGE | Freq: Once | INTRAMUSCULAR | Status: DC

## 2021-02-11 MED ORDER — SODIUM CHLORIDE 0.9 % IV SOLN
1.0000 g | Freq: Once | INTRAVENOUS | Status: AC
  Administered 2021-02-11: 22:00:00 1 g via INTRAVENOUS
  Filled 2021-02-11: qty 10

## 2021-02-11 MED ORDER — ACETAMINOPHEN 500 MG PO TABS: 1000.0000 mg | ORAL_TABLET | Freq: Four times a day (QID) | ORAL | Status: AC | PRN

## 2021-02-11 NOTE — ED Provider Notes (Signed)
[facility] Emergency Department Provider Note  ____________________________________________   Event Date/Time   First MD Initiated Contact with Patient 02/11/21 1849     (approximate)  I have reviewed the triage vital signs and the nursing notes.   HISTORY  Chief Complaint Altered Mental Status   HPI Malik [name] is a 75 y.o. 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  Patient is unable for any history on arrival secondary to altered mental status.  Per daughter she is sometimes able to engage in a little bit of conversation at baseline but became much more altered on 12/1.  They note they tried to get patient evaluated in the emergency room on 12/2 but left due to long wait times.  They took patient initially to fast med earlier today and he was transferred to the emergency room from there.  At that time he was noted to be hypoxic with a room air of 95% and was transferred ported on 2 L.  He was also given 1 DuoNeb by EMS.         Past Medical History:  Diagnosis Date   Angina of effort ([place])    Chronic diastolic CHF (congestive heart failure) (HCC)    CVA (cerebral vascular accident) ([place]) 08/10/2019   right sided weakness   Diabetes ([place])    Diabetes mellitus without complication ([place])    Dilated cardiomyopathy ([place])    Heart disease    High blood pressure    Hypertension    LVH (left ventricular hypertrophy) due to hypertensive disease    Moderate mitral insufficiency    Neuropathy    with LE weakness   Sleep apnea    TBI (traumatic brain injury)     Patient Active Problem List   Diagnosis Date Noted   Pneumonia due to COVID-19 virus 03/16/2020    Severe sepsis ([place]) 03/16/2020   Respiratory failure, acute ([place]) 03/16/2020   Hemiparesis ([place]) 03/16/2020   Aphasia 03/16/2020   Goals of care, counseling/discussion    Palliative care by specialist    Acute anemia 09/28/2019   History of CVA (cerebrovascular accident) 09/28/2019   Hyperlipidemia LDL goal <70 08/24/2019   Dysphagia due to recent cerebral infarction 08/24/2019   Severe protein-calorie malnutrition ([place]) 08/24/2019   Iron deficiency anemia 08/24/2019   AKI (acute kidney injury) ([place]) 08/24/2019   CKD (chronic kidney disease), stage IIIa 08/24/2019   Alcohol abuse 08/24/2019   Obesity 08/24/2019   Multinodular thyroid 08/24/2019   Pressure injury of skin 67/61/9509   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, unknown source 08/10/2019   Thrombocytosis    Acute lower UTI    Labile blood pressure    Hydrothorax    Sleep disturbance    Pleural effusion on left    Hypertensive crisis    Hypertension    Leukocytosis    Benign essential HTN    Diabetes mellitus type 2 in nonobese (HCC)    Acute blood loss anemia    Trauma 05/27/2017   TBI (traumatic brain injury)    Essential hypertension    Chronic diastolic congestive heart failure ([place])    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   Rib fractures 05/10/2017   Anemia due  to chronic kidney disease 07/01/2015    Past Surgical History:  Procedure Laterality Date   BACK SURGERY     ESOPHAGOGASTRODUODENOSCOPY (EGD) WITH PROPOFOL N/A 10/02/2019   Procedure: ESOPHAGOGASTRODUODENOSCOPY (EGD) WITH PROPOFOL;  Surgeon: Irene Shipper, MD;  Location[facility] ENDOSCOPY;  Service: Endoscopy;  Laterality: N/A;   IR CT HEAD LTD  08/10/2019   IR PERCUTANEOUS ART THROMBECTOMY/INFUSION INTRACRANIAL INC DIAG ANGIO  08/10/2019   RADIOLOGY WITH ANESTHESIA N/A 08/10/2019   Procedure: IR WITH ANESTHESIA -CODE STROKE;  Surgeon: Radiologist, Medication, MD;  Location: [place];  Service: Radiology;  Laterality: N/A;   SPLENECTOMY, TOTAL N/A 05/10/2017    Procedure: TRAUMA EXPLORATORY LAP FOR SPLENECTOMY;  Surgeon: Clovis Riley, MD;  Location: [place];  Service: General;  Laterality: N/A;    Prior to Admission medications   Medication Sig Start Date End Date Taking? Authorizing Provider  acetaminophen (TYLENOL) 325 MG tablet Take 650 mg by mouth every 8 (eight) hours as needed for mild pain, fever or moderate pain.    [provider]  amLODipine (NORVASC) 10 MG tablet Take 10 mg by mouth daily.    [provider]  feeding supplement, ENSURE ENLIVE, (ENSURE ENLIVE) LIQD Take 237 mLs by mouth 3 (three) times daily between meals. 08/24/19   Donzetta Starch, NP  folic acid (FOLVITE) 850 MCG tablet Take 800 mcg by mouth daily.    [provider]  losartan (COZAAR) 25 MG tablet Take 25 mg by mouth daily. 02/14/20   [provider]  Maltodextrin-Xanthan Gum (RESOURCE THICKENUP CLEAR) POWD Take 120 g by mouth as needed (for nectar thick liquids). 08/24/19   Donzetta Starch, NP  metFORMIN (GLUCOPHAGE) 500 MG tablet Take 1,000 mg by mouth 2 (two) times daily with a meal.    [provider]  metoprolol tartrate (LOPRESSOR) 25 MG tablet Take 1 tablet (25 mg total) by mouth 2 (two) times daily. 08/24/19   Donzetta Starch, NP  pantoprazole (PROTONIX) 40 MG tablet Take 1 tablet (40 mg total) by mouth daily. 06/07/17   Bary Leriche, PA-C    Allergies Patient has no known allergies.  Family History  Problem Relation Age of Onset   Hypertension Mother    Hypertension Other     Social History Social History   Tobacco Use   Smoking status: Former    Packs/day: 1.50    Years: 30.00    Pack years: 45.00    Types: Cigarettes   Smokeless tobacco: Never  Vaping Use   Vaping Use: Never used  Substance Use Topics   Alcohol use: Yes   Drug use: No    Review of Systems  Review of Systems  Unable to perform ROS: Mental status change     ____________________________________________   PHYSICAL  EXAM:  VITAL SIGNS: ED Triage Vitals  Enc Vitals Group     BP 02/11/21 1543 (!) 165/68     Pulse Rate 02/11/21 1543 82     Resp 02/11/21 1543 18     Temp 02/11/21 1543 97.6 F (36.4 C)     Temp Source 02/11/21 1543 Oral     SpO2 02/11/21 1543 95 %     Weight 02/11/21 1543 180 lb (81.6 kg)     Height 02/11/21 1543 5\' 8"  (1.727 m)     Head Circumference --      Peak Flow --      Pain Score 02/11/21 1539 0     Pain Loc --  Pain Edu? --      Excl. in [place]? --    Vitals:   02/11/21 2109 02/11/21 2130  BP: (!) 186/76 (!) 170/75  Pulse: 90 84  Resp: 20 18  Temp:    SpO2: 95% 96%   Physical Exam Vitals and nursing note reviewed.  Constitutional:      General: He is not in acute distress.    Appearance: He is well-developed.  HENT:     Head: Normocephalic and atraumatic.     Right Ear: External ear normal.     Left Ear: External ear normal.  Eyes:     Conjunctiva/sclera: Conjunctivae normal.  Cardiovascular:     Rate and Rhythm: Normal rate and regular rhythm.     Heart sounds: No murmur heard. Pulmonary:     Effort: Pulmonary effort is normal. No respiratory distress.     Breath sounds: Normal breath sounds.  Abdominal:     Palpations: Abdomen is soft.     Tenderness: There is no abdominal tenderness.  Musculoskeletal:        General: No swelling.     Cervical back: Neck supple.  Skin:    General: Skin is warm and dry.     Capillary Refill: Capillary refill takes less than 2 seconds.  Neurological:     Mental Status: He is alert. He is disoriented and confused.  Psychiatric:        Mood and Affect: Mood normal.    Patient is moaning in bed.  He is able to move his left upper extremity and toes on command intermittently although is not able to reliably do so.  He does not move his right upper extremity.  PERRLA.  EOMI.  He does not otherwise participate in exam.  No obvious trauma to the face scalp head neck or back although patient is moaning throughout so  somewhat difficult to assess for focal areas of tenderness.  Abdomen seems soft.  Anterior neck goiter palpated. ____________________________________________   LABS (all labs ordered are listed, but only abnormal results are displayed)  Labs Reviewed  COMPREHENSIVE METABOLIC PANEL - Abnormal; Notable for the following components:      Result Value   Chloride 112 (*)    Glucose, Bld 132 (*)    Calcium 8.8 (*)    All other components within normal limits  CBC - Abnormal; Notable for the following components:   WBC 17.8 (*)    RBC 3.67 (*)    Hemoglobin 7.3 (*)    HCT 25.1 (*)    MCV 68.4 (*)    MCH 19.9 (*)    MCHC 29.1 (*)    RDW 20.8 (*)    Platelets 530 (*)    All other components within normal limits  BLOOD GAS, VENOUS - Abnormal; Notable for the following components:   pH, Ven 7.48 (*)    pCO2, Ven 34 (*)    All other components within normal limits  BRAIN NATRIURETIC PEPTIDE - Abnormal; Notable for the following components:   B Natriuretic Peptide 148.7 (*)    All other components within normal limits  RESP PANEL BY RT-PCR (FLU A&B, COVID) ARPGX2  CULTURE, BLOOD (ROUTINE X 2)  CULTURE, BLOOD (ROUTINE X 2)  PROTIME-INR  PROCALCITONIN  LACTIC ACID, PLASMA  URINALYSIS, COMPLETE (UACMP) WITH MICROSCOPIC  LACTIC ACID, PLASMA  TSH  T4, FREE  CBG MONITORING, ED  TYPE AND SCREEN   ____________________________________________  EKG  ECG remarkable sinus rhythm without evidence of acute ischemia  or significant arrhythmia.  Ventricular rate is 82.  Normal axis, unremarkable intervals ____________________________________________  RADIOLOGY  ED MD interpretation:    Chest x-ray shows some patchy atelectasis and possible consolidation at the lung bases superimposed on chronic interstitial changes and cardiomegaly.  CTA chest shows bilateral peripheral segmental and subsegmental PEs without evidence of right heart strain.  There are some atelectasis but no evidence of  pulmonary infarct, pneumonia, significant edema or effusion.  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  CT abdomen pelvis shows no evidence of hematoma, cholecystitis, pancreatitis, diverticulitis, or other acute abdominal pelvic process.  No evidence of significant trauma on CT chest, abdomen and pelvis.  CT head shows increased size of mixed density left subdural measuring 12 mm in maximal thickness appearing subacute.  There is also possibly acute area posterior along the posterior aspect without mass-effect.  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  CT C-spine shows no acute fracture or traumatic subluxation.  Ultrasound of the bilateral lower extremity shows no evidence of DVT.  Official radiology report(s): CT HEAD WO CONTRAST (5MM)  Result Date: 02/11/2021 CLINICAL DATA:  Mental status change unknown cause, face pain. EXAM: CT HEAD WITHOUT CONTRAST CT CERVICAL SPINE WITHOUT CONTRAST TECHNIQUE: Multidetector CT imaging of the head and cervical spine was performed following the standard protocol without intravenous contrast. Multiplanar CT image reconstructions of the cervical spine were also generated. COMPARISON:  October 31, 2020. FINDINGS: CT HEAD FINDINGS Brain: Increased size of a now mixed density near whole hemispheric left subdural collection, measuring 12 mm in thickness with a few hyperdense areas within the collection for instance on image 25/2. This is Merry Proud with radiology on trying to reach whoever taken Caris sandal black wall vacuum the doctor please dex. Similar appearance of the large left hemispheric remote infarct and encephalomalacia. Stable age related global parenchymal volume loss with ex vacuo dilatation of ventricular system. Similar mild burden of chronic ischemic white matter disease. No hydrocephalus. Vascular: No hyperdense vessel. Atherosclerotic calcifications  of the internal carotid arteries at the skull base. Skull: Negative for fracture or focal lesion. Sinuses/Orbits: Mild mucosal thickening in the bilateral maxillary sinuses and ethmoid air cells. Orbits are grossly unremarkable. Other: Mastoid air cells are predominantly clear. CT CERVICAL SPINE FINDINGS Alignment: Straightening of the normal cervical lordosis. No acute subluxation. Skull base and vertebrae: No acute fracture. Diffuse demineralization of bone. 1 cm sclerotic focus in the right first rib is nonspecific but favored to reflect a bone island. Soft tissues and spinal canal: No prevertebral fluid or swelling. No visible canal hematoma. Disc levels: Multilevel degenerative change with disc space narrowing and endplate irregularity. Upper chest: Biapical emphysematous change. Other: Partially visualized large multinodular goiter previously evaluated with thyroid ultrasound on October 29, 2019. IMPRESSION: 1. Increased size of a now mixed density near whole hemispheric left subdural collection measuring 12 mm in maximal thickness, favored to reflect a subdural hematoma which is technically age indeterminate but overall appears subacute, however there is a hyperdense area particularly along the posterior aspect which may reflect more acute blood products. No significant associated mass effect. 2. Similar appearance of the large left hemispheric remote infarct and encephalomalacia. 3. No acute fracture or subluxation of the cervical spine. 4. Partially visualized large multinodular goiter previously evaluated with thyroid ultrasound on October 29, 2019. These results were called by telephone at the time of interpretation on 02/11/2021 at 8:42 pm to provider [facility]  Aryannah Mohon , who verbally acknowledged these results. Electronically Signed   By: Dahlia Bailiff M.D.   On: 02/11/2021 20:45   CT Angio Chest PE W and/or Wo Contrast  Result Date: 02/11/2021 CLINICAL DATA:  Suspected PE. Abdominal pain. EXAM: CT  ANGIOGRAPHY CHEST CT ABDOMEN AND PELVIS WITH CONTRAST TECHNIQUE: Multidetector CT imaging of the chest was performed using the standard protocol during bolus administration of intravenous contrast. Multiplanar CT image reconstructions and MIPs were obtained to evaluate the vascular anatomy. Multidetector CT imaging of the abdomen and pelvis was performed using the standard protocol during bolus administration of intravenous contrast. CONTRAST:  181mL OMNIPAQUE IOHEXOL 350 MG/ML SOLN COMPARISON:  September 30, 2019 FINDINGS: CTA CHEST FINDINGS Cardiovascular: Somewhat suboptimal opacification of the segmental pulmonary arteries. Streak artifact from the right arm down. There are bilateral peripheral segmental and subsegmental pulmonary emboli; small clot burden. No evidence of heart strain. Mediastinum/Nodes: Large heterogeneous containing calcifications right thyroid mass extends into the anterior mediastinum and measures approximately 4.7 by 4.2 by 5.6 cm. No definite mediastinal lymphadenopathy. Trachea and esophagus are normal. Lungs/Pleura: Bibasilar atelectasis. No definite pulmonary infarcts. Musculoskeletal: No chest wall abnormality. No acute or significant osseous findings. Review of the MIP images confirms the above findings. CT ABDOMEN and PELVIS FINDINGS Hepatobiliary: No focal liver abnormality is seen. No gallstones, gallbladder wall thickening, or biliary dilatation. Pancreas: Unremarkable. No pancreatic ductal dilatation or surrounding inflammatory changes. Spleen: Surgically absent. Adrenals/Urinary Tract: Normal adrenal glands. Bilateral renal cysts. Normal urinary bladder. Stomach/Bowel: Stomach is within normal limits. Appendix appears normal. No evidence of bowel wall thickening, distention, or inflammatory changes. Vascular/Lymphatic: Aortic atherosclerosis. No enlarged abdominal or pelvic lymph nodes. Reproductive: Prostate is unremarkable. Other: No abdominal wall hernia or abnormality. No  abdominopelvic ascites. Musculoskeletal: No acute or significant osseous findings. Review of the MIP images confirms the above findings. IMPRESSION: 1. Bilateral peripheral segmental and subsegmental pulmonary emboli; small clot burden. No evidence of heart strain. 2. Bibasilar atelectasis. No definite pulmonary infarcts. 3. No acute abnormalities within the abdomen or pelvis. 4. Large heterogeneous calcifications containing right thyroid mass extends into the anterior mediastinum measures up to 5.6 cm. Recommend thyroid US (ref: J Am Coll Radiol. 2015 Feb;12(2): 143-50). 5. Aortic atherosclerosis. Aortic Atherosclerosis (ICD10-I70.0). Electronically Signed   By: Fidela Salisbury M.D.   On: 02/11/2021 20:32   CT Cervical Spine Wo Contrast  Result Date: 02/11/2021 CLINICAL DATA:  Mental status change unknown cause, face pain. EXAM: CT HEAD WITHOUT CONTRAST CT CERVICAL SPINE WITHOUT CONTRAST TECHNIQUE: Multidetector CT imaging of the head and cervical spine was performed following the standard protocol without intravenous contrast. Multiplanar CT image reconstructions of the cervical spine were also generated. COMPARISON:  October 31, 2020. FINDINGS: CT HEAD FINDINGS Brain: Increased size of a now mixed density near whole hemispheric left subdural collection, measuring 12 mm in thickness with a few hyperdense areas within the collection for instance on image 25/2. This is Merry Proud with radiology on trying to reach whoever taken Caris sandal black wall vacuum the doctor please dex. Similar appearance of the large left hemispheric remote infarct and encephalomalacia. Stable age related global parenchymal volume loss with ex vacuo dilatation of ventricular system. Similar mild burden of chronic ischemic white matter disease. No hydrocephalus. Vascular: No hyperdense vessel. Atherosclerotic calcifications of the internal carotid arteries at the skull base. Skull: Negative for fracture or focal lesion. Sinuses/Orbits:  Mild mucosal thickening in the bilateral maxillary sinuses and ethmoid air cells. Orbits are grossly unremarkable. Other: Mastoid air  cells are predominantly clear. CT CERVICAL SPINE FINDINGS Alignment: Straightening of the normal cervical lordosis. No acute subluxation. Skull base and vertebrae: No acute fracture. Diffuse demineralization of bone. 1 cm sclerotic focus in the right first rib is nonspecific but favored to reflect a bone island. Soft tissues and spinal canal: No prevertebral fluid or swelling. No visible canal hematoma. Disc levels: Multilevel degenerative change with disc space narrowing and endplate irregularity. Upper chest: Biapical emphysematous change. Other: Partially visualized large multinodular goiter previously evaluated with thyroid ultrasound on October 29, 2019. IMPRESSION: 1. Increased size of a now mixed density near whole hemispheric left subdural collection measuring 12 mm in maximal thickness, favored to reflect a subdural hematoma which is technically age indeterminate but overall appears subacute, however there is a hyperdense area particularly along the posterior aspect which may reflect more acute blood products. No significant associated mass effect. 2. Similar appearance of the large left hemispheric remote infarct and encephalomalacia. 3. No acute fracture or subluxation of the cervical spine. 4. Partially visualized large multinodular goiter previously evaluated with thyroid ultrasound on October 29, 2019. These results were called by telephone at the time of interpretation on 02/11/2021 at 8:42 pm to provider [facility] , who verbally acknowledged these results. Electronically Signed   By: Dahlia Bailiff M.D.   On: 02/11/2021 20:45   CT ABDOMEN PELVIS W CONTRAST  Result Date: 02/11/2021 CLINICAL DATA:  Suspected PE. Abdominal pain. EXAM: CT ANGIOGRAPHY CHEST CT ABDOMEN AND PELVIS WITH CONTRAST TECHNIQUE: Multidetector CT imaging of the chest was performed using the  standard protocol during bolus administration of intravenous contrast. Multiplanar CT image reconstructions and MIPs were obtained to evaluate the vascular anatomy. Multidetector CT imaging of the abdomen and pelvis was performed using the standard protocol during bolus administration of intravenous contrast. CONTRAST:  149mL OMNIPAQUE IOHEXOL 350 MG/ML SOLN COMPARISON:  September 30, 2019 FINDINGS: CTA CHEST FINDINGS Cardiovascular: Somewhat suboptimal opacification of the segmental pulmonary arteries. Streak artifact from the right arm down. There are bilateral peripheral segmental and subsegmental pulmonary emboli; small clot burden. No evidence of heart strain. Mediastinum/Nodes: Large heterogeneous containing calcifications right thyroid mass extends into the anterior mediastinum and measures approximately 4.7 by 4.2 by 5.6 cm. No definite mediastinal lymphadenopathy. Trachea and esophagus are normal. Lungs/Pleura: Bibasilar atelectasis. No definite pulmonary infarcts. Musculoskeletal: No chest wall abnormality. No acute or significant osseous findings. Review of the MIP images confirms the above findings. CT ABDOMEN and PELVIS FINDINGS Hepatobiliary: No focal liver abnormality is seen. No gallstones, gallbladder wall thickening, or biliary dilatation. Pancreas: Unremarkable. No pancreatic ductal dilatation or surrounding inflammatory changes. Spleen: Surgically absent. Adrenals/Urinary Tract: Normal adrenal glands. Bilateral renal cysts. Normal urinary bladder. Stomach/Bowel: Stomach is within normal limits. Appendix appears normal. No evidence of bowel wall thickening, distention, or inflammatory changes. Vascular/Lymphatic: Aortic atherosclerosis. No enlarged abdominal or pelvic lymph nodes. Reproductive: Prostate is unremarkable. Other: No abdominal wall hernia or abnormality. No abdominopelvic ascites. Musculoskeletal: No acute or significant osseous findings. Review of the MIP images confirms the above  findings. IMPRESSION: 1. Bilateral peripheral segmental and subsegmental pulmonary emboli; small clot burden. No evidence of heart strain. 2. Bibasilar atelectasis. No definite pulmonary infarcts. 3. No acute abnormalities within the abdomen or pelvis. 4. Large heterogeneous calcifications containing right thyroid mass extends into the anterior mediastinum measures up to 5.6 cm. Recommend thyroid US (ref: J Am Coll Radiol. 2015 Feb;12(2): 143-50). 5. Aortic atherosclerosis. Aortic Atherosclerosis (ICD10-I70.0). Electronically Signed   By: Linwood Dibbles.D.  On: 02/11/2021 20:32   US Venous Img Lower Bilateral  Result Date: 02/11/2021 CLINICAL DATA:  Known bilateral pulmonary emboli. EXAM: BILATERAL LOWER EXTREMITY VENOUS DOPPLER ULTRASOUND TECHNIQUE: Gray-scale sonography with graded compression, as well as color Doppler and duplex ultrasound were performed to evaluate the lower extremity deep venous systems from the level of the common femoral vein and including the common femoral, femoral, profunda femoral, popliteal and calf veins including the posterior tibial, peroneal and gastrocnemius veins when visible. The superficial great saphenous vein was also interrogated. Spectral Doppler was utilized to evaluate flow at rest and with distal augmentation maneuvers in the common femoral, femoral and popliteal veins. COMPARISON:  None. FINDINGS: RIGHT LOWER EXTREMITY Common Femoral Vein: No evidence of thrombus. Normal compressibility, respiratory phasicity and response to augmentation. Saphenofemoral Junction: No evidence of thrombus. Normal compressibility and flow on color Doppler imaging. Profunda Femoral Vein: No evidence of thrombus. Normal compressibility and flow on color Doppler imaging. Femoral Vein: No evidence of thrombus. Normal compressibility, respiratory phasicity and response to augmentation. Popliteal Vein: No evidence of thrombus. Normal compressibility, respiratory phasicity and response  to augmentation. Calf Veins: No evidence of thrombus. Normal compressibility and flow on color Doppler imaging. Superficial Great Saphenous Vein: No evidence of thrombus. Normal compressibility. Venous Reflux:  None. Other Findings:  None. LEFT LOWER EXTREMITY Common Femoral Vein: No evidence of thrombus. Normal compressibility, respiratory phasicity and response to augmentation. Saphenofemoral Junction: No evidence of thrombus. Normal compressibility and flow on color Doppler imaging. Profunda Femoral Vein: No evidence of thrombus. Normal compressibility and flow on color Doppler imaging. Femoral Vein: No evidence of thrombus. Normal compressibility, respiratory phasicity and response to augmentation. Popliteal Vein: No evidence of thrombus. Normal compressibility, respiratory phasicity and response to augmentation. Calf Veins: No evidence of thrombus. Normal compressibility and flow on color Doppler imaging. Superficial Great Saphenous Vein: No evidence of thrombus. Normal compressibility. Venous Reflux:  None. Other Findings:  None. IMPRESSION: No evidence of deep venous thrombosis in either lower extremity. Electronically Signed   By: Inez Catalina M.D.   On: 02/11/2021 21:42   DG Chest Portable 1 View  Result Date: 02/11/2021 CLINICAL DATA:  Shortness of breath EXAM: PORTABLE CHEST 1 VIEW COMPARISON:  02/08/2021 FINDINGS: Shallow inspiration with low lung volumes. Chronic interstitial changes with background emphysema. Superimposed patchy opacities at the lung bases. Pulmonary vascular congestion. No pleural effusion. No pneumothorax. Similar cardiomediastinal contours with cardiomegaly. IMPRESSION: Patchy atelectasis/consolidation at the lung bases superimposed on chronic interstitial changes. Pulmonary vascular congestion and cardiomegaly. Electronically Signed   By: Macy Mis M.D.   On: 02/11/2021 20:05    ____________________________________________   PROCEDURES  Procedure(s) performed  (including Critical Care):  .1-3 Lead EKG Interpretation Performed by: Lucrezia Starch, MD Authorized by: Lucrezia Starch, MD     Interpretation: normal     ECG rate assessment: normal     Rhythm: sinus rhythm     Ectopy: none     Conduction: normal   .Critical Care Performed by: Lucrezia Starch, MD Authorized by: Lucrezia Starch, MD   Critical care provider statement:    Critical care time (minutes):  30   Critical care was necessary to treat or prevent imminent or life-threatening deterioration of the following conditions:  Respiratory failure   Critical care was time spent personally by me on the following activities:  Development of treatment plan with patient or surrogate, discussions with consultants, evaluation of patient's response to treatment, examination of patient, ordering and review of  laboratory studies, ordering and review of radiographic studies, ordering and performing treatments and interventions, pulse oximetry, re-evaluation of patient's condition and review of old charts   ____________________________________________   INITIAL IMPRESSION / ASSESSMENT AND PLAN / ED COURSE      Patient presents with above-stated history exam for assessment of altered mental status and shortness of breath with new respiratory failure.  On arrival patient is alert hypertensive with otherwise stable vital signs on 2 L nasal cannula.  Trial of room air patient 88%.  2 L nasal cannula placed.  He does seem fairly altered and is weak in his right arm when she does not move but otherwise moves the left upper extremity and bilateral lower extremities spontaneously.  No obvious evidence of trauma although given very limited history differential considerations include acute CVA, subdural from fall, arrhythmia, pneumonia, ACS, PE, anemia, metabolic derangements, acute infectious process and endocrine derangements.  ECG without evidence of significant arrhythmia or ischemia.  Chest x-ray  shows some patchy atelectasis and possible consolidation at the lung bases superimposed on chronic interstitial changes and cardiomegaly.  CTA chest shows bilateral peripheral segmental and subsegmental PEs without evidence of right heart strain.  There are some atelectasis but no evidence of pulmonary infarct, pneumonia, significant edema or effusion.  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  CT abdomen pelvis shows no evidence of hematoma, cholecystitis, pancreatitis, diverticulitis, or other acute abdominal pelvic process.  No evidence of significant trauma on CT chest, abdomen and pelvis.  CT head shows increased size of mixed density left subdural measuring 12 mm in maximal thickness appearing subacute.  There is also possibly acute area posterior along the posterior aspect without mass-effect.  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  CT C-spine shows no acute fracture or traumatic subluxation.  Ultrasound of the bilateral lower extremity shows no evidence of DVT.  CMP without significant electrolyte or metabolic derangements.  CBC remarkable for leukocytosis with WBC count of 17.8 and hemoglobin of 7.3 compared to 7.52 days ago.  No recent hemoglobin to compare to as it was last checked in January and it was 12.7.  Platelets today are 530.  INR is within normal limits.  BNP 148.7.  Overall patient does not appear significantly volume overloaded at this time.  Procalcitonin elevated at 1.13.  VBG without evidence of hypercarbic respiratory failure with a pH of 7.48 with a PCO2 of 34 and a bicarb of 25.2.  Lactic acid is not elevated at 1.4.  Neurosurgery consulted for above-noted findings and CT head recommends admission and holding off on anticoagulation for patient's blood clots pending stability scan at 6 hours and likely repeat tomorrow.  Patient can get prophylactic Lovenox for  DVTs.  Also send UA to assess for infection and give a dose of Rocephin given patient's leukocytosis and elevated procalcitonin.  Discussed patient's ED work-up with family who feels patient should be full code at this time.  I will admit to medicine service for further evaluation and management.      ____________________________________________   FINAL CLINICAL IMPRESSION(S) / ED DIAGNOSES  Final diagnoses:  Acute respiratory failure with hypoxia (HCC)  Multiple subsegmental pulmonary emboli without acute cor pulmonale (HCC)  Altered mental status, unspecified altered mental status type  Subdural bleeding (HCC)    Medications  cefTRIAXone (ROCEPHIN) 1 g in sodium chloride 0.9 % 100 mL IVPB (1 g Intravenous New Bag/Given 02/11/21 2206)  fentaNYL (  SUBLIMAZE) injection 50 mcg (has no administration in time range)  iohexol (OMNIPAQUE) 350 MG/ML injection 100 mL (100 mLs Intravenous Contrast Given 02/11/21 1944)     ED Discharge Orders     None        Note:  This document was prepared using Dragon voice recognition software and may include unintentional dictation errors.    Lucrezia Starch, MD 02/11/21 2234

## 2021-02-11 NOTE — H&P (Addendum)
History and Physical   Malik [name] VHQ:[id number] DOB: [birth year] DOA: 02/11/2021  PCP: Donnie Coffin, MD  Outpatient Specialists: Dr. Honor Junes Patient coming from: [place] [facility] via EMS  I have personally briefly reviewed patient's old medical records in [place].  Chief Concern: Altered mental status  HPI: Malik [name] is a 75 y.o. male with medical history significant for CVA status post thrombectomy approximately 2 years ago, dementia, non-insulin-dependent diabetes mellitus, hypertension, GERD, who presents emergency department from home for chief concerns of shortness of breath and worsening altered mental status.  Per son, Malik [name] who is the legal healthcare power of attorney, states that patient fell off the bed on Tuesday.  When family found him, he was on his knees.  Since Tuesday, 02/06/2021, he has been mostly bedbound.  At bedside patient speech is garbled and difficult to understand.  Patient is able to follow commands and squeeze my fingers using his left hand, he is able to move his bilateral lower extremities without any difficulty.  He opens his mouth at my request.  He squeezes his eye tightly shut when I utilize a flashlight pointing it out his eyes.   Of note his right upper extremity, has fingers that appear to be contracted and he is not able to follow my commands by squeezing my hands utilizing his right upper extremities.  This appears to be chronic.  Social history: He lives at home with family.  Unknown tobacco, EtOH, recreational drug use history  Vaccination history: Unknown  ROS: Not able to complete given acuity of the situation and patient's history of dementia  ED Course: Discussed with emergency medicine provider, patient requiring hospitalization for chief concerns of multiple diagnoses including acute and subacute subdural hematoma and bilateral pulmonary embolism.  Vitals in the emergency department was remarkable for  temperature of 97.6, respiration rate of 18, heart rate of 82, blood pressure 165/68, SPO2 of 90% on room air and 95% 2 L nasal cannula.  Labs in the emergency department showed sodium 141, potassium 3.8, chloride 112, bicarb 24, BUN of 20, serum creatinine of 1.21, nonfasting blood glucose 132, GFR of greater than 60, BNP 148.7, WBC elevated at 17.8, hemoglobin 7.3, platelets 530.  Assessment/Plan  Principal Problem:   Subdural hematoma Active Problems:   Anemia due to chronic kidney disease   TBI (traumatic brain injury)   Essential hypertension   Hypertension   Leukocytosis   Benign essential HTN   Hyperlipidemia LDL goal <70   Severe protein-calorie malnutrition (HCC)   Alcohol abuse   Obesity   Pulmonary embolism ([place])   # Altered mental status presumed secondary to subdural hematoma - Admit to stepdown, inpatient - Neurosurgery has been consulted, who recommends to repeat head CT in approximately 6 hours, no anticoagulation at this time, neurosurgery would not recommend surgical evacuation at this time due to lack of mass-effect and inability to anticoagulate in the postoperative period  # Leukocytosis-presumed secondary to aspiration pneumonia in setting of falling # Met sepsis criteria however the signs of increased mild respiration rate presume due to bilateral small burden PE - Added Zosyn - Added azithromycin for atypical coverage - Blood cultures x2 have been ordered - Patient is maintaining appropriate MAP, therefore no IV fluid at this time, given that patient has a subdural hematoma I want to keep the SBP less than 160  # Bilateral pulmonary embolism - I called radiology who calculated that the RV ratio is exactly 0.9 - Bilateral lower  extremity ultrasound to assess for DVT was read as no evidence of DVT - I discussed patient's case with vascular surgeon, Dr. Feliberto Gottron who states he will see the patient tomorrow.  Dr. Feliberto Gottron agrees that no anticoagulation should be  given at this time and that given the burden is small, patient is not a candidate for thrombectomy. -Vascular surgeon was concerned that should the burden become larger, patient is not a candidate for thrombectomy as prior treatment and posttreatment requires anticoagulation.  Patient would be a candidate for aspiration of the clot should the clot burden and large - I discussed the above with family and they elected they would like patient to stay at [facility] for tonight pending family discussion regarding goals of care and will let us know regarding their decision tomorrow morning  # Hypertension-Home medications were not resumed: Amlodipine 10 mg daily, losartan 100 mg daily, metoprolol tartrate 25 mg p.o. twice daily - Labetalol injection 5 mg IV every hours as needed for SBP greater than 180, 2 days  # Non-insulin-dependent diabetes mellitus-holding home metformin 1000 mg p.o. twice daily - Insulin SSI with at bedtime coverage ordered  # History of CVA-status post thrombectomy approximately 2 years ago per family-no aspirin at this time due to subdural  # Goals of care discussion: Extensive discussion with healthcare power of attorney regarding the guarded to poor prognosis of his father - Malik [name] will have a discussion with the family and make a decision regarding the goals of care for his father - I discussed extensively that patient is currently stable at this time and that there is no indication for craniotomy as there is no mass-effect.  I also explained that this hospital does not perform craniotomy should he come to that.  I also extensively discussed that should patient require craniotomy, patient is unlikely to have any meaningful recovery given that patient has baseline dementia - I also discussed that patient's bilateral pulmonary embolism will not receive anticoagulation due to subdural hematoma, and that giving patient anticoagulation will increase patient's risk of  bleeding  N.p.o.-pending speech evaluation  Discussed with cross coverage provider, Dr. Hal Hope  Chart reviewed.   DVT prophylaxis: TED hose Code Status: Full code Diet: N.p.o. except for sips with meds and ice chips Family Communication: Extensive discussion with family, who is healthcare power of attorney, Mr. Arthuro Canelo, [name] Disposition Plan: Pending clinical course, guarded prognosis Consults called: Neurosurgery, vascular Admission status: Inpatient, stepdown  Past Medical History:  Diagnosis Date   Angina of effort ([place])    Chronic diastolic CHF (congestive heart failure) ([place])    CVA (cerebral vascular accident) ([place]) 08/10/2019   right sided weakness   Diabetes ([place])    Diabetes mellitus without complication ([place])    Dilated cardiomyopathy ([place])    Heart disease    High blood pressure    Hypertension    LVH (left ventricular hypertrophy) due to hypertensive disease    Moderate mitral insufficiency    Neuropathy    with LE weakness   Sleep apnea    TBI (traumatic brain injury)    Past Surgical History:  Procedure Laterality Date   BACK SURGERY     ESOPHAGOGASTRODUODENOSCOPY (EGD) WITH PROPOFOL N/A 10/02/2019   Procedure: ESOPHAGOGASTRODUODENOSCOPY (EGD) WITH PROPOFOL;  Surgeon: Irene Shipper, MD;  Location: [facility];  Service: Endoscopy;  Laterality: N/A;   IR CT HEAD LTD  08/10/2019   IR PERCUTANEOUS ART THROMBECTOMY/INFUSION INTRACRANIAL INC DIAG ANGIO  08/10/2019   RADIOLOGY  WITH ANESTHESIA N/A 08/10/2019   Procedure: IR WITH ANESTHESIA -CODE STROKE;  Surgeon: Radiologist, Medication, MD;  Location: [place];  Service: Radiology;  Laterality: N/A;   SPLENECTOMY, 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 His smoking use included cigarettes. He has a 45.00 pack-year smoking history. He has  never used smokeless tobacco. He reports current alcohol use. He reports that he does not use drugs.  No Known Allergies Family History  Problem Relation Age of Onset   Hypertension Mother    Hypertension Other    Family history: Family history reviewed and not pertinent  Prior to Admission medications   Medication Sig Start Date End Date Taking? Authorizing Provider  acetaminophen (TYLENOL) 325 MG tablet Take 650 mg by mouth every 8 (eight) hours as needed for mild pain, fever or moderate pain.   Yes [provider]  amLODipine (NORVASC) 10 MG tablet Take 10 mg by mouth daily.   Yes [provider]  feeding supplement, ENSURE ENLIVE, (ENSURE ENLIVE) LIQD Take 237 mLs by mouth 3 (three) times daily between meals. 08/24/19  Yes Donzetta Starch, NP  folic acid (FOLVITE) 025 MCG tablet Take 800 mcg by mouth daily.   Yes [provider]  latanoprost (XALATAN) 0.005 % ophthalmic solution Place 1 drop into both eyes at bedtime. 01/18/21  Yes [provider]  losartan (COZAAR) 100 MG tablet Take 100 mg by mouth daily. 01/18/21  Yes [provider]  Maltodextrin-Xanthan Gum (RESOURCE THICKENUP CLEAR) POWD Take 120 g by mouth as needed (for nectar thick liquids). 08/24/19  Yes Donzetta Starch, NP  metFORMIN (GLUCOPHAGE) 500 MG tablet Take 1,000 mg by mouth 2 (two) times daily with a meal.   Yes [provider]  pantoprazole (PROTONIX) 40 MG tablet Take 1 tablet (40 mg total) by mouth daily. 06/07/17  Yes Love, Ivan Anchors, PA-C  metoprolol tartrate (LOPRESSOR) 25 MG tablet Take 1 tablet (25 mg total) by mouth 2 (two) times daily. 08/24/19   Donzetta Starch, NP   Physical Exam: Vitals:   02/11/21 2109 02/11/21 2130 02/11/21 2300 02/12/21 0000  BP: (!) 186/76 (!) 170/75 (!) 167/68 (!) 187/75  Pulse: 90 84 86 85  Resp: _0 (!) 23  Temp:      TempSrc:      SpO2: 95% 96% 97% 96%  Weight:      Height:       Constitutional: appears age-appropriate,  frail, NAD, calm, comfortable Eyes: PERRL, lids and conjunctivae normal ENMT: Mucous membranes are moist. Posterior pharynx clear of any exudate or lesions. Age-appropriate dentition. Hearing appropriate Neck: normal, supple, no masses, no thyromegaly Respiratory: clear to auscultation bilaterally, no wheezing, no crackles. Normal respiratory effort. No accessory muscle use.  Cardiovascular: Regular rate and rhythm, no murmurs / rubs / gallops. No extremity edema. 2+ pedal pulses. No carotid bruits.  Abdomen: no tenderness, no masses palpated, no hepatosplenomegaly. Bowel sounds positive.  Musculoskeletal: no clubbing / cyanosis. No joint deformity lower extremities.  Right hand contractures. Normal muscle tone.  Skin: no rashes, lesions, ulcers. No induration Neurologic: Sensation intact. Strength 5/5 in all bilateral lower and left upper extremities.  Does not follow commands with the right upper extremities. Psychiatric: Normal judgment and insight. Alert and oriented x 3. Normal mood.   EKG: independently reviewed,  showing sinus rhythm with rate of 82, QTc 472  Chest x-ray on Admission: I personally reviewed and I agree with radiologist reading as below.  CT HEAD WO CONTRAST (5MM)  Result Date: 02/11/2021 CLINICAL DATA:  Mental status change unknown cause, face pain. EXAM: CT HEAD WITHOUT CONTRAST CT CERVICAL SPINE WITHOUT CONTRAST TECHNIQUE: Multidetector CT imaging of the head and cervical spine was performed following the standard protocol without intravenous contrast. Multiplanar CT image reconstructions of the cervical spine were also generated. COMPARISON:  October 31, 2020. FINDINGS: CT HEAD FINDINGS Brain: Increased size of a now mixed density near whole hemispheric left subdural collection, measuring 12 mm in thickness with a few hyperdense areas within the collection for instance on image 25/2. This is Merry Proud with radiology on trying to reach whoever taken Caris sandal black wall vacuum  the doctor please dex. Similar appearance of the large left hemispheric remote infarct and encephalomalacia. Stable age related global parenchymal volume loss with ex vacuo dilatation of ventricular system. Similar mild burden of chronic ischemic white matter disease. No hydrocephalus. Vascular: No hyperdense vessel. Atherosclerotic calcifications of the internal carotid arteries at the skull base. Skull: Negative for fracture or focal lesion. Sinuses/Orbits: Mild mucosal thickening in the bilateral maxillary sinuses and ethmoid air cells. Orbits are grossly unremarkable. Other: Mastoid air cells are predominantly clear. CT CERVICAL SPINE FINDINGS Alignment: Straightening of the normal cervical lordosis. No acute subluxation. Skull base and vertebrae: No acute fracture. Diffuse demineralization of bone. 1 cm sclerotic focus in the right first rib is nonspecific but favored to reflect a bone island. Soft tissues and spinal canal: No prevertebral fluid or swelling. No visible canal hematoma. Disc levels: Multilevel degenerative change with disc space narrowing and endplate irregularity. Upper chest: Biapical emphysematous change. Other: Partially visualized large multinodular goiter previously evaluated with thyroid ultrasound on October 29, 2019. IMPRESSION: 1. Increased size of a now mixed density near whole hemispheric left subdural collection measuring 12 mm in maximal thickness, favored to reflect a subdural hematoma which is technically age indeterminate but overall appears subacute, however there is a hyperdense area particularly along the posterior aspect which may reflect more acute blood products. No significant associated mass effect. 2. Similar appearance of the large left hemispheric remote infarct and encephalomalacia. 3. No acute fracture or subluxation of the cervical spine. 4. Partially visualized large multinodular goiter previously evaluated with thyroid ultrasound on October 29, 2019. These results  were called by telephone at the time of interpretation on 02/11/2021 at 8:42 pm to provider [facility] , who verbally acknowledged these results. Electronically Signed   By: Dahlia Bailiff M.D.   On: 02/11/2021 20:45   CT Angio Chest PE W and/or Wo Contrast  Addendum Date: 02/11/2021   ADDENDUM REPORT: 02/11/2021 23:16 ADDENDUM: The RV/LV ratio is calculated at 0.9. Electronically Signed   By: Fidela Salisbury M.D.   On: 02/11/2021 23:16   Result Date: 02/11/2021 CLINICAL DATA:  Suspected PE. Abdominal pain. EXAM: CT ANGIOGRAPHY CHEST CT ABDOMEN AND PELVIS WITH CONTRAST TECHNIQUE: Multidetector CT imaging of the chest was performed using the standard protocol during bolus administration of intravenous contrast. Multiplanar CT image reconstructions and MIPs were obtained to evaluate the vascular anatomy. Multidetector CT imaging of the abdomen and pelvis was performed using the standard protocol during bolus administration of intravenous contrast. CONTRAST:  166m OMNIPAQUE IOHEXOL 350 MG/ML SOLN COMPARISON:  September 30, 2019 FINDINGS: CTA CHEST FINDINGS Cardiovascular: Somewhat suboptimal opacification of the segmental pulmonary arteries. Streak artifact from  the right arm down. There are bilateral peripheral segmental and subsegmental pulmonary emboli; small clot burden. No evidence of heart strain. Mediastinum/Nodes: Large heterogeneous containing calcifications right thyroid mass extends into the anterior mediastinum and measures approximately 4.7 by 4.2 by 5.6 cm. No definite mediastinal lymphadenopathy. Trachea and esophagus are normal. Lungs/Pleura: Bibasilar atelectasis. No definite pulmonary infarcts. Musculoskeletal: No chest wall abnormality. No acute or significant osseous findings. Review of the MIP images confirms the above findings. CT ABDOMEN and PELVIS FINDINGS Hepatobiliary: No focal liver abnormality is seen. No gallstones, gallbladder wall thickening, or biliary dilatation. Pancreas:  Unremarkable. No pancreatic ductal dilatation or surrounding inflammatory changes. Spleen: Surgically absent. Adrenals/Urinary Tract: Normal adrenal glands. Bilateral renal cysts. Normal urinary bladder. Stomach/Bowel: Stomach is within normal limits. Appendix appears normal. No evidence of bowel wall thickening, distention, or inflammatory changes. Vascular/Lymphatic: Aortic atherosclerosis. No enlarged abdominal or pelvic lymph nodes. Reproductive: Prostate is unremarkable. Other: No abdominal wall hernia or abnormality. No abdominopelvic ascites. Musculoskeletal: No acute or significant osseous findings. Review of the MIP images confirms the above findings. IMPRESSION: 1. Bilateral peripheral segmental and subsegmental pulmonary emboli; small clot burden. No evidence of heart strain. 2. Bibasilar atelectasis. No definite pulmonary infarcts. 3. No acute abnormalities within the abdomen or pelvis. 4. Large heterogeneous calcifications containing right thyroid mass extends into the anterior mediastinum measures up to 5.6 cm. Recommend thyroid US (ref: J Am Coll Radiol. 2015 Feb;12(2): 143-50). 5. Aortic atherosclerosis. Aortic Atherosclerosis (ICD10-I70.0). Electronically Signed: By: Fidela Salisbury M.D. On: 02/11/2021 20:32   CT Cervical Spine Wo Contrast  Result Date: 02/11/2021 CLINICAL DATA:  Mental status change unknown cause, face pain. EXAM: CT HEAD WITHOUT CONTRAST CT CERVICAL SPINE WITHOUT CONTRAST TECHNIQUE: Multidetector CT imaging of the head and cervical spine was performed following the standard protocol without intravenous contrast. Multiplanar CT image reconstructions of the cervical spine were also generated. COMPARISON:  October 31, 2020. FINDINGS: CT HEAD FINDINGS Brain: Increased size of a now mixed density near whole hemispheric left subdural collection, measuring 12 mm in thickness with a few hyperdense areas within the collection for instance on image 25/2. This is Merry Proud with radiology  on trying to reach whoever taken Caris sandal black wall vacuum the doctor please dex. Similar appearance of the large left hemispheric remote infarct and encephalomalacia. Stable age related global parenchymal volume loss with ex vacuo dilatation of ventricular system. Similar mild burden of chronic ischemic white matter disease. No hydrocephalus. Vascular: No hyperdense vessel. Atherosclerotic calcifications of the internal carotid arteries at the skull base. Skull: Negative for fracture or focal lesion. Sinuses/Orbits: Mild mucosal thickening in the bilateral maxillary sinuses and ethmoid air cells. Orbits are grossly unremarkable. Other: Mastoid air cells are predominantly clear. CT CERVICAL SPINE FINDINGS Alignment: Straightening of the normal cervical lordosis. No acute subluxation. Skull base and vertebrae: No acute fracture. Diffuse demineralization of bone. 1 cm sclerotic focus in the right first rib is nonspecific but favored to reflect a bone island. Soft tissues and spinal canal: No prevertebral fluid or swelling. No visible canal hematoma. Disc levels: Multilevel degenerative change with disc space narrowing and endplate irregularity. Upper chest: Biapical emphysematous change. Other: Partially visualized large multinodular goiter previously evaluated with thyroid ultrasound on October 29, 2019. IMPRESSION: 1. Increased size of a now mixed density near whole hemispheric left subdural collection measuring 12 mm in maximal thickness, favored to reflect a subdural hematoma which is technically age indeterminate but overall appears subacute, however there is a hyperdense area particularly along the  posterior aspect which may reflect more acute blood products. No significant associated mass effect. 2. Similar appearance of the large left hemispheric remote infarct and encephalomalacia. 3. No acute fracture or subluxation of the cervical spine. 4. Partially visualized large multinodular goiter previously  evaluated with thyroid ultrasound on October 29, 2019. These results were called by telephone at the time of interpretation on 02/11/2021 at 8:42 pm to provider [facility] , who verbally acknowledged these results. Electronically Signed   By: Dahlia Bailiff M.D.   On: 02/11/2021 20:45   CT ABDOMEN PELVIS W CONTRAST  Addendum Date: 02/11/2021   ADDENDUM REPORT: 02/11/2021 23:16 ADDENDUM: The RV/LV ratio is calculated at 0.9. Electronically Signed   By: Fidela Salisbury M.D.   On: 02/11/2021 23:16   Result Date: 02/11/2021 CLINICAL DATA:  Suspected PE. Abdominal pain. EXAM: CT ANGIOGRAPHY CHEST CT ABDOMEN AND PELVIS WITH CONTRAST TECHNIQUE: Multidetector CT imaging of the chest was performed using the standard protocol during bolus administration of intravenous contrast. Multiplanar CT image reconstructions and MIPs were obtained to evaluate the vascular anatomy. Multidetector CT imaging of the abdomen and pelvis was performed using the standard protocol during bolus administration of intravenous contrast. CONTRAST:  128m OMNIPAQUE IOHEXOL 350 MG/ML SOLN COMPARISON:  September 30, 2019 FINDINGS: CTA CHEST FINDINGS Cardiovascular: Somewhat suboptimal opacification of the segmental pulmonary arteries. Streak artifact from the right arm down. There are bilateral peripheral segmental and subsegmental pulmonary emboli; small clot burden. No evidence of heart strain. Mediastinum/Nodes: Large heterogeneous containing calcifications right thyroid mass extends into the anterior mediastinum and measures approximately 4.7 by 4.2 by 5.6 cm. No definite mediastinal lymphadenopathy. Trachea and esophagus are normal. Lungs/Pleura: Bibasilar atelectasis. No definite pulmonary infarcts. Musculoskeletal: No chest wall abnormality. No acute or significant osseous findings. Review of the MIP images confirms the above findings. CT ABDOMEN and PELVIS FINDINGS Hepatobiliary: No focal liver abnormality is seen. No gallstones, gallbladder  wall thickening, or biliary dilatation. Pancreas: Unremarkable. No pancreatic ductal dilatation or surrounding inflammatory changes. Spleen: Surgically absent. Adrenals/Urinary Tract: Normal adrenal glands. Bilateral renal cysts. Normal urinary bladder. Stomach/Bowel: Stomach is within normal limits. Appendix appears normal. No evidence of bowel wall thickening, distention, or inflammatory changes. Vascular/Lymphatic: Aortic atherosclerosis. No enlarged abdominal or pelvic lymph nodes. Reproductive: Prostate is unremarkable. Other: No abdominal wall hernia or abnormality. No abdominopelvic ascites. Musculoskeletal: No acute or significant osseous findings. Review of the MIP images confirms the above findings. IMPRESSION: 1. Bilateral peripheral segmental and subsegmental pulmonary emboli; small clot burden. No evidence of heart strain. 2. Bibasilar atelectasis. No definite pulmonary infarcts. 3. No acute abnormalities within the abdomen or pelvis. 4. Large heterogeneous calcifications containing right thyroid mass extends into the anterior mediastinum measures up to 5.6 cm. Recommend thyroid [place](ref: J Am Coll Radiol. 2015 Feb;12(2): 143-50). 5. Aortic atherosclerosis. Aortic Atherosclerosis (ICD10-I70.0). Electronically Signed: By: DFidela SalisburyM.D. On: 02/11/2021 20:32   [place] Im[place] Lower Bilateral  Result Date: 02/11/2021 CLINICAL DATA:  Known bilateral pulmonary emboli. EXAM: BILATERAL LOWER EXTREMITY VENOUS DOPPLER ULTRASOUND TECHNIQUE: Gray-scale sonography with graded compression, as well as color Doppler and duplex ultrasound were performed to evaluate the lower extremity deep venous systems from the level of the common femoral vein and including the common femoral, femoral, profunda femoral, popliteal and calf veins including the posterior tibial, peroneal and gastrocnemius veins when visible. The superficial great saphenous vein was also interrogated. Spectral Doppler was utilized to evaluate  flow at rest and with distal augmentation maneuvers in the common  femoral, femoral and popliteal veins. COMPARISON:  None. FINDINGS: RIGHT LOWER EXTREMITY Common Femoral Vein: No evidence of thrombus. Normal compressibility, respiratory phasicity and response to augmentation. Saphenofemoral Junction: No evidence of thrombus. Normal compressibility and flow on color Doppler imaging. Profunda Femoral Vein: No evidence of thrombus. Normal compressibility and flow on color Doppler imaging. Femoral Vein: No evidence of thrombus. Normal compressibility, respiratory phasicity and response to augmentation. Popliteal Vein: No evidence of thrombus. Normal compressibility, respiratory phasicity and response to augmentation. Calf Veins: No evidence of thrombus. Normal compressibility and flow on color Doppler imaging. Superficial Great Saphenous Vein: No evidence of thrombus. Normal compressibility. Venous Reflux:  None. Other Findings:  None. LEFT LOWER EXTREMITY Common Femoral Vein: No evidence of thrombus. Normal compressibility, respiratory phasicity and response to augmentation. Saphenofemoral Junction: No evidence of thrombus. Normal compressibility and flow on color Doppler imaging. Profunda Femoral Vein: No evidence of thrombus. Normal compressibility and flow on color Doppler imaging. Femoral Vein: No evidence of thrombus. Normal compressibility, respiratory phasicity and response to augmentation. Popliteal Vein: No evidence of thrombus. Normal compressibility, respiratory phasicity and response to augmentation. Calf Veins: No evidence of thrombus. Normal compressibility and flow on color Doppler imaging. Superficial Great Saphenous Vein: No evidence of thrombus. Normal compressibility. Venous Reflux:  None. Other Findings:  None. IMPRESSION: No evidence of deep venous thrombosis in either lower extremity. Electronically Signed   By: Inez Catalina M.D.   On: 02/11/2021 21:42   DG Chest Portable 1 View  Result Date:  02/11/2021 CLINICAL DATA:  Shortness of breath EXAM: PORTABLE CHEST 1 VIEW COMPARISON:  02/08/2021 FINDINGS: Shallow inspiration with low lung volumes. Chronic interstitial changes with background emphysema. Superimposed patchy opacities at the lung bases. Pulmonary vascular congestion. No pleural effusion. No pneumothorax. Similar cardiomediastinal contours with cardiomegaly. IMPRESSION: Patchy atelectasis/consolidation at the lung bases superimposed on chronic interstitial changes. Pulmonary vascular congestion and cardiomegaly. Electronically Signed   By: Macy Mis M.D.   On: 02/11/2021 20:05    Labs on Admission: I have personally reviewed following labs  CBC: Recent Labs  Lab 02/08/21 1457 02/09/21 1816 02/11/21 1559  WBC 11.9* 14.0* 17.8*  NEUTROABS  --  8.5*  --   HGB 7.3* 7.5* 7.3*  HCT 24.9* 26.6* 25.1*  MCV 69.0* 70.9* 68.4*  PLT 541* 536* 749*   Basic Metabolic Panel: Recent Labs  Lab 02/08/21 1457 02/09/21 1816 02/11/21 1559  NA 138 139 141  K 3.8 3.9 3.8  CL 108 109 112*  CO2 24 21* 24  GLUCOSE 134* 137* 132*  BUN _0 CREATININE 1.05 1.13 1.21  CALCIUM 8.8* 9.3 8.8*   GFR: Estimated Creatinine Clearance: 50.2 mL/min (by C-G formula based on SCr of 1.21 mg/dL).  Liver Function Tests: Recent Labs  Lab 02/11/21 1559  AST 23  ALT 27  ALKPHOS 97  BILITOT 0.6  PROT 7.8  ALBUMIN 3.6   Recent Labs  Lab 02/09/21 1816  LIPASE 59*   Coagulation Profile: Recent Labs  Lab 02/11/21 1559  INR 1.0   Urine analysis:    Component Value Date/Time   COLORURINE YELLOW 02/11/2021 2120   APPEARANCEUR CLEAR (A) 02/11/2021 2120   APPEARANCEUR Clear 06/11/2011 2046   LABSPEC 1.015 02/11/2021 2120   LABSPEC 1.025 06/11/2011 2046   PHURINE 7.0 02/11/2021 2120   GLUCOSEU NEGATIVE 02/11/2021 2120   GLUCOSEU Negative 06/11/2011 2046   HGBUR NEGATIVE 02/11/2021 2120   [place] NEGATIVE 02/11/2021 2120   BILIRUBINUR Negative 06/11/2011 2046   Benjamin Stain  NEGATIVE 02/11/2021 2120   PROTEINUR NEGATIVE 02/11/2021 2120   NITRITE NEGATIVE 02/11/2021 2120   LEUKOCYTESUR NEGATIVE 02/11/2021 2120   LEUKOCYTESUR Negative 06/11/2011 2046   CRITICAL CARE Performed by: Briant Cedar   Total critical care time: 50 minutes  Critical care time was exclusive of separately billable procedures and treating other patients.  Critical care was necessary to treat or prevent imminent or life-threatening deterioration.  Subdural hematoma, bilateral pulmonary embolism, met sepsis criteria  Critical care was time spent personally by me on the following activities: development of treatment plan with patient and/or surrogate as well as nursing, discussions with consultants, evaluation of patient's response to treatment, examination of patient, obtaining history from patient or surrogate, ordering and performing treatments and interventions, ordering and review of laboratory studies, ordering and review of radiographic studies, pulse oximetry and re-evaluation of patient's condition.  Dr. Tobie Poet [facility]  If 7PM-7AM, please contact overnight-coverage provider If 7AM-7PM, please contact day coverage provider [email or web address]  02/12/2021, 12:27 AM

## 2021-02-11 NOTE — ED Triage Notes (Signed)
Pt in via EMS from [facility] with c/o breathing difficulty and AMS. 90% RA, placed on 2l, 100% Pt was given 1 duoned, FSBS 284, 175/82, HR77. Pt with hx of CVA and rt sided deficits.

## 2021-02-11 NOTE — ED Triage Notes (Signed)
Pt was found Friday on his knees but was able to get up and went to [facility] later that afternoon for pain in his face- pt has not been acting right since then- pt tearful in triage

## 2021-02-11 NOTE — ED Notes (Signed)
Patient transported to CT 

## 2021-02-11 NOTE — Consult Note (Signed)
Consult received.  Full note to follow.  75 yo male with history of dementia presents today with difficulty breathing.  Imaging shows bilateral Pe's but no right heart strain.    CT head shows a small subdural hematoma of indeterminate age, but not acute.  He has a significant stroke underlying the subdural.  I have reviewed the imaging including his head CT from 2 weeks ago.    75 yo male with bilateral pulmonary emboli with small clot burden and no heart strain.  He has a subacute subdural hematoma overlying a significant L hemispheric stroke (chronic).  He has elevated WBC which is being worked up.  - Would repeat HCT in 6 hours.  If stable will rediscuss timeline for anticoagulation tomorrow after scan is stable.  - Hold AED's for now given subacute hematoma and dementia. - Would not recommend surgical evacuation at this point given lack of mass effect and inability to anticoagulate in the post-operative period. - Will need to be admitted to ICU versus stepdown per ED and hospitalist  Meade Maw MD

## 2021-02-12 ENCOUNTER — Inpatient Hospital Stay: Payer: Medicare Other

## 2021-02-12 ENCOUNTER — Inpatient Hospital Stay (HOSPITAL_COMMUNITY)
Admit: 2021-02-12 | Discharge: 2021-02-12 | Disposition: A | Discharge status: Home / Self Care | Payer: Medicare Other | Attending: Internal Medicine | Admitting: Internal Medicine

## 2021-02-12 DIAGNOSIS — I2609 Other pulmonary embolism with acute cor pulmonale: Secondary | ICD-10-CM | POA: Diagnosis not present

## 2021-02-12 LAB — CBC
HCT: 23 % — ABNORMAL LOW (ref 39.0–52.0)
Hemoglobin: 6.7 g/dL — ABNORMAL LOW (ref 13.0–17.0)
MCH: 19.5 pg — ABNORMAL LOW (ref 26.0–34.0)
MCHC: 29.1 g/dL — ABNORMAL LOW (ref 30.0–36.0)
MCV: 67.1 fL — ABNORMAL LOW (ref 80.0–100.0)
Platelets: 495 10*3/uL — ABNORMAL HIGH (ref 150–400)
RBC: 3.43 MIL/uL — ABNORMAL LOW (ref 4.22–5.81)
RDW: 19.8 % — ABNORMAL HIGH (ref 11.5–15.5)
WBC: 14.7 10*3/uL — ABNORMAL HIGH (ref 4.0–10.5)
nRBC: 0.3 % — ABNORMAL HIGH (ref 0.0–0.2)

## 2021-02-12 LAB — BASIC METABOLIC PANEL
Anion gap: 7 (ref 5–15)
BUN: 15 mg/dL (ref 8–23)
CO2: 24 mmol/L (ref 22–32)
Calcium: 8.7 mg/dL — ABNORMAL LOW (ref 8.9–10.3)
Chloride: 114 mmol/L — ABNORMAL HIGH (ref 98–111)
Creatinine, Ser: 0.91 mg/dL (ref 0.61–1.24)
GFR, Estimated: >60 mL/min (ref >60)
Glucose, Bld: 137 mg/dL — ABNORMAL HIGH (ref 70–99)
Potassium: 3.6 mmol/L (ref 3.5–5.1)
Sodium: 145 mmol/L (ref 135–145)

## 2021-02-12 LAB — HEMOGLOBIN A1C
Hgb A1c MFr Bld: 7.8 % — ABNORMAL HIGH (ref 4.8–5.6)
Mean Plasma Glucose: 177 mg/dL

## 2021-02-12 LAB — ECHOCARDIOGRAM COMPLETE
AR max vel: 1.98 cm2
AV Area VTI: 2.34 cm2
AV Area mean vel: 2.01 cm2
AV Mean grad: 5 mmHg
AV Peak grad: 9.7 mmHg
Ao pk vel: 1.56 m/s
Area-P 1/2: 2.92 cm2
Height: 68 in
MV VTI: 2.27 cm2
S' Lateral: 3.26 cm
Weight: 2880 oz

## 2021-02-12 LAB — PREPARE RBC (CROSSMATCH)

## 2021-02-12 LAB — GLUCOSE, CAPILLARY
Glucose-Capillary: 135 mg/dL — ABNORMAL HIGH (ref 70–99)
Glucose-Capillary: 125 mg/dL — ABNORMAL HIGH (ref 70–99)
Glucose-Capillary: 74 mg/dL (ref 70–99)
Glucose-Capillary: 95 mg/dL (ref 70–99)

## 2021-02-12 LAB — MRSA NEXT GEN BY PCR, NASAL: MRSA by PCR Next Gen: NOT DETECTED

## 2021-02-12 LAB — VITAMIN B12: Vitamin B-12: 224 pg/mL (ref 180–914)

## 2021-02-12 LAB — APTT: aPTT: 33 seconds (ref 24–36)

## 2021-02-12 MED ORDER — FOLIC ACID 1 MG PO TABS
1000.0000 ug | ORAL_TABLET | Freq: Every day | ORAL | Status: DC
  Administered 2021-02-15 – 2021-02-16 (×2): 1 mg via ORAL
  Filled 2021-02-12 (×2): qty 1

## 2021-02-12 MED ORDER — METFORMIN HCL 500 MG PO TABS: 1000.0000 mg | ORAL_TABLET | Freq: Two times a day (BID) | ORAL | Status: DC

## 2021-02-12 MED ORDER — METOPROLOL TARTRATE 25 MG PO TABS: 25.0000 mg | ORAL_TABLET | Freq: Two times a day (BID) | ORAL | Status: DC

## 2021-02-12 MED ORDER — PANTOPRAZOLE SODIUM 40 MG IV SOLR
40.0000 mg | Freq: Two times a day (BID) | INTRAVENOUS | Status: DC
  Administered 2021-02-12 – 2021-02-16 (×9): 40 mg via INTRAVENOUS
  Filled 2021-02-12 (×9): qty 40

## 2021-02-12 MED ORDER — LOSARTAN POTASSIUM 50 MG PO TABS: 100.0000 mg | ORAL_TABLET | Freq: Every day | ORAL | Status: DC

## 2021-02-12 MED ORDER — LABETALOL HCL 5 MG/ML IV SOLN
5.0000 mg | INTRAVENOUS | Status: DC | PRN
  Administered 2021-02-12: 5 mg via INTRAVENOUS
  Filled 2021-02-12: qty 4

## 2021-02-12 MED ORDER — SODIUM CHLORIDE 0.9% IV SOLUTION: Freq: Once | INTRAVENOUS | Status: DC

## 2021-02-12 MED ORDER — LEVETIRACETAM IN NACL 500 MG/100ML IV SOLN
500.0000 mg | Freq: Two times a day (BID) | INTRAVENOUS | Status: DC
  Administered 2021-02-12 – 2021-02-16 (×9): 500 mg via INTRAVENOUS
  Filled 2021-02-12 (×10): qty 100

## 2021-02-12 MED ORDER — LATANOPROST 0.005 % OP SOLN
1.0000 [drp] | Freq: Every day | OPHTHALMIC | Status: DC
Start: 2021-02-12 — End: 2021-02-17
  Administered 2021-02-12 – 2021-02-15 (×5): 1 [drp] via OPHTHALMIC
  Filled 2021-02-12: qty 2.5

## 2021-02-12 MED ORDER — INSULIN ASPART 100 UNIT/ML IJ SOLN: 0.0000 [IU] | Freq: Every day | INTRAMUSCULAR | Status: DC

## 2021-02-12 MED ORDER — AMLODIPINE BESYLATE 10 MG PO TABS: 10.0000 mg | ORAL_TABLET | Freq: Every day | ORAL | Status: DC

## 2021-02-12 MED ORDER — SODIUM CHLORIDE 0.9 % IV SOLN
3.0000 g | Freq: Four times a day (QID) | INTRAVENOUS | Status: DC
  Administered 2021-02-13 – 2021-02-16 (×14): 3 g via INTRAVENOUS
  Filled 2021-02-12 (×8): qty 3
  Filled 2021-02-12: qty 8
  Filled 2021-02-12: qty 3
  Filled 2021-02-12 (×2): qty 8
  Filled 2021-02-12: qty 3
  Filled 2021-02-12: qty 8
  Filled 2021-02-12: qty 3

## 2021-02-12 MED ORDER — HYDRALAZINE HCL 20 MG/ML IJ SOLN
10.0000 mg | Freq: Four times a day (QID) | INTRAMUSCULAR | Status: DC | PRN
  Administered 2021-02-13: 10 mg via INTRAVENOUS
  Filled 2021-02-12: qty 1

## 2021-02-12 MED ORDER — CHLORHEXIDINE GLUCONATE CLOTH 2 % EX PADS
6.0000 | MEDICATED_PAD | Freq: Every day | CUTANEOUS | Status: DC
  Administered 2021-02-12 – 2021-02-16 (×3): 6 via TOPICAL

## 2021-02-12 MED ORDER — INSULIN ASPART 100 UNIT/ML IJ SOLN
0.0000 [IU] | Freq: Three times a day (TID) | INTRAMUSCULAR | Status: DC
Start: 2021-02-12 — End: 2021-02-13
  Administered 2021-02-12 (×2): 2 [IU] via SUBCUTANEOUS
  Filled 2021-02-12: qty 1

## 2021-02-12 MED ORDER — PIPERACILLIN-TAZOBACTAM 3.375 G IVPB
3.3750 g | Freq: Three times a day (TID) | INTRAVENOUS | Status: AC
  Administered 2021-02-12 (×3): 3.375 g via INTRAVENOUS
  Filled 2021-02-12 (×3): qty 50

## 2021-02-12 NOTE — Consult Note (Signed)
Neurosurgery-New Consultation Evaluation 02/12/2021 Malik [name] [id number]  Identifying Statement: Malik [name] is a 75 y.o. male from [place] [address] with subdural hematoma  Physician Requesting Consultation: Dr Posey Pronto  History of Present Illness: Malik [name] is here with history of left MCA stroke, DM, dementia, and had new AMS and SOB. He reportedly at baseline does communicate but after a fall last week has been immobile and not communicating. He had a CT head on admission which revealed a subacute on chronic SDH but also found to have new PE. He is not on anticoagulant now. We are consulted given the SDH.   Past Medical History:  Past Medical History:  Diagnosis Date   Angina of effort (HCC)    Chronic diastolic CHF (congestive heart failure) (HCC)    CVA (cerebral vascular accident) (Malik [name]) 08/10/2019   right sided weakness   Diabetes (Malik [name])    Diabetes mellitus without complication (Malik [name])    Dilated cardiomyopathy (Malik [name])    Heart disease    High blood pressure    Hypertension    LVH (left ventricular hypertrophy) due to hypertensive disease    Moderate mitral insufficiency    Neuropathy    with LE weakness   Sleep apnea    TBI (traumatic brain injury)     Social History: Social History   Socioeconomic History   Marital status: Single    Spouse name: Not on file   Number of children: Not on file   Years of education: Not on file   Highest education level: Not on file  Occupational History   Not on file  Tobacco Use   Smoking status: Former    Packs/day: 1.50    Years: 30.00    Pack years: 45.00    Types: Cigarettes   Smokeless tobacco: Never  Vaping Use   Vaping Use: Never used  Substance and Sexual Activity   Alcohol use: Yes   Drug use: No   Sexual activity: Not Currently  Other Topics Concern   Not on file  Social History Narrative   ** Merged History Encounter **       Social Determinants of Health   Financial Resource Strain: Not on file   Food Insecurity: Not on file  Transportation Needs: Not on file  Physical Activity: Not on file  Stress: Not on file  Social Connections: Not on file  Intimate Partner Violence: Not on file   Family History: Family History  Problem Relation Age of Onset   Hypertension Mother    Hypertension Other     Review of Systems:  Review of Systems - Unable to obtain remainder of ROS given AMS and no family present Neurological ROS: Positive for confusion, AMS   Physical Exam: BP (!) 152/77   Pulse 75   Temp 98.2 F (36.8 C) (Oral)   Resp 19   Ht 5\' 8"  (1.727 m)   Wt 81.6 kg   SpO2 97%   BMI 27.37 kg/m  Body mass index is 27.37 kg/m. Body surface area is 1.98 meters squared. General appearance: Alert, cooperative, in no acute distress Head: Normocephalic, prior right side well healed incision Eyes: Normal, EOM intact Ext: No edema in LE bilaterally, contractures in RUE  Neurologic exam:  Mental status: alertness: awakens to voice. Does not answer any orientation questions.  Does not follow commands Speech: appears aphasic at this time but does nod to questioning appropriately Cranial nerves:  III/IV/VI: extra-ocular motions intact bilaterally V/VII:no evidence of facial droop or weakness  VIII: hearing normal Motor: No movement RUE. Appears full strength 5/5 in LUE and BLE Gait: not tested  Laboratory: Results for orders placed or performed during the hospital encounter of 02/11/21  Resp Panel by RT-PCR (Flu A&B, Covid) Nasopharyngeal Swab   Specimen: Nasopharyngeal Swab; Nasopharyngeal(NP) swabs in vial transport medium  Result Value Ref Range   SARS Coronavirus 2 by RT PCR NEGATIVE NEGATIVE   Influenza A by PCR NEGATIVE NEGATIVE   Influenza B by PCR NEGATIVE NEGATIVE  Blood culture (routine x 2)   Specimen: BLOOD  Result Value Ref Range   Specimen Description BLOOD FA    Special Requests      BOTTLES DRAWN AEROBIC AND ANAEROBIC Blood Culture results may not be  optimal due to an excessive volume of blood received in culture bottles   Culture      NO GROWTH < 12 HOURS Performed at [facility], [address] [place], [place] [address]    Report Status PENDING   Blood culture (routine x 2)   Specimen: BLOOD  Result Value Ref Range   Specimen Description BLOOD RHUND    Special Requests      BOTTLES DRAWN AEROBIC ONLY Blood Culture results may not be optimal due to an inadequate volume of blood received in culture bottles   Culture      NO GROWTH < 12 HOURS Performed at [facility], [address] [place], [place] [address]    Report Status PENDING   MRSA Next Gen by PCR, Nasal   Specimen: Nasal Mucosa; Nasal Swab  Result Value Ref Range   MRSA by PCR Next Gen NOT DETECTED NOT DETECTED  Comprehensive metabolic panel  Result Value Ref Range   Sodium 141 135 - 145 mmol/L   Potassium 3.8 3.5 - 5.1 mmol/L   Chloride 112 (H) 98 - 111 mmol/L   CO2 24 22 - 32 mmol/L   Glucose, Bld 132 (H) 70 - 99 mg/dL   BUN 20 8 - 23 mg/dL   Creatinine, Ser 1.21 0.61 - 1.24 mg/dL   Calcium 8.8 (L) 8.9 - 10.3 mg/dL   Total Protein 7.8 6.5 - 8.1 g/dL   Albumin 3.6 3.5 - 5.0 g/dL   AST 23 15 - 41 U/L   ALT 27 0 - 44 U/L   Alkaline Phosphatase 97 38 - 126 U/L   Total Bilirubin 0.6 0.3 - 1.2 mg/dL   GFR, Estimated >60 >60 mL/min   Anion gap 5 5 - 15  CBC  Result Value Ref Range   WBC 17.8 (H) 4.0 - 10.5 K/uL   RBC 3.67 (L) 4.22 - 5.81 MIL/uL   Hemoglobin 7.3 (L) 13.0 - 17.0 g/dL   HCT 25.1 (L) 39.0 - 52.0 %   MCV 68.4 (L) 80.0 - 100.0 fL   MCH 19.9 (L) 26.0 - 34.0 pg   MCHC 29.1 (L) 30.0 - 36.0 g/dL   RDW 20.8 (H) 11.5 - 15.5 %   Platelets 530 (H) 150 - 400 K/uL   nRBC 0.2 0.0 - 0.2 %  Protime-INR - (order if patient is taking Coumadin / Warfarin)  Result Value Ref Range   Prothrombin Time 13.4 11.4 - 15.2 seconds   INR 1.0 0.8 - 1.2  Procalcitonin - Baseline  Result Value Ref Range   Procalcitonin 1.13 ng/mL  Urinalysis,  Complete w Microscopic  Result Value Ref Range   Color, Urine YELLOW YELLOW   APPearance CLEAR (A) CLEAR   Specific Gravity, Urine  1.015 1.005 - 1.030   pH 7.0 5.0 - 8.0   Glucose, UA NEGATIVE NEGATIVE mg/dL   Hgb urine dipstick NEGATIVE NEGATIVE   Bilirubin Urine NEGATIVE NEGATIVE   Ketones, ur NEGATIVE NEGATIVE mg/dL   Protein, ur NEGATIVE NEGATIVE mg/dL   Nitrite NEGATIVE NEGATIVE   Leukocytes,Ua NEGATIVE NEGATIVE   RBC / HPF 0-5 0 - 5 RBC/hpf   WBC, UA 0-5 0 - 5 WBC/hpf   Bacteria, UA NONE SEEN NONE SEEN   Squamous Epithelial / LPF 0-5 0 - 5   Mucus PRESENT   Blood gas, venous  Result Value Ref Range   pH, Ven 7.48 (H) 7.250 - 7.430   pCO2, Ven 34 (L) 44.0 - 60.0 mmHg   pO2, Ven 42.0 32.0 - 45.0 mmHg   Bicarbonate 25.3 20.0 - 28.0 mmol/L   Acid-Base Excess 1.7 0.0 - 2.0 mmol/L   O2 Saturation 81.6 %   Patient temperature 37.0    Collection site VEIN    Sample type VENOUS   Brain natriuretic peptide  Result Value Ref Range   B Natriuretic Peptide 148.7 (H) 0.0 - 100.0 pg/mL  Lactic acid, plasma  Result Value Ref Range   Lactic Acid, Venous 1.4 0.5 - 1.9 mmol/L  Lactic acid, plasma  Result Value Ref Range   Lactic Acid, Venous 1.4 0.5 - 1.9 mmol/L  TSH  Result Value Ref Range   TSH 0.697 0.350 - 4.500 uIU/mL  T4, free  Result Value Ref Range   Free T4 0.80 0.61 - 1.12 ng/dL  Basic metabolic panel  Result Value Ref Range   Sodium 145 135 - 145 mmol/L   Potassium 3.6 3.5 - 5.1 mmol/L   Chloride 114 (H) 98 - 111 mmol/L   CO2 24 22 - 32 mmol/L   Glucose, Bld 137 (H) 70 - 99 mg/dL   BUN 15 8 - 23 mg/dL   Creatinine, Ser 0.91 0.61 - 1.24 mg/dL   Calcium 8.7 (L) 8.9 - 10.3 mg/dL   GFR, Estimated >60 >60 mL/min   Anion gap 7 5 - 15  CBC  Result Value Ref Range   WBC 14.7 (H) 4.0 - 10.5 K/uL   RBC 3.43 (L) 4.22 - 5.81 MIL/uL   Hemoglobin 6.7 (L) 13.0 - 17.0 g/dL   HCT 23.0 (L) 39.0 - 52.0 %   MCV 67.1 (L) 80.0 - 100.0 fL   MCH 19.5 (L) 26.0 - 34.0 pg   MCHC  29.1 (L) 30.0 - 36.0 g/dL   RDW 19.8 (H) 11.5 - 15.5 %   Platelets 495 (H) 150 - 400 K/uL   nRBC 0.3 (H) 0.0 - 0.2 %  APTT  Result Value Ref Range   aPTT 33 24 - 36 seconds  Glucose, capillary  Result Value Ref Range   Glucose-Capillary 150 (H) 70 - 99 mg/dL  Glucose, capillary  Result Value Ref Range   Glucose-Capillary 135 (H) 70 - 99 mg/dL  ECHOCARDIOGRAM COMPLETE  Result Value Ref Range   Weight 2,880 oz   Height 68 in   BP 164/65 mmHg  Type and screen [place]  Result Value Ref Range   ABO/RH(D) O POS    Antibody Screen NEG    Sample Expiration      02/14/2021,2359 Performed at [facility], [address] [place], [place] [address]    I personally reviewed labs  Imaging: CT Head: 1. No change in a 12 mm thick mixed density left cerebral convexity subdural  hemorrhage. No significant or progressive mass effect. 2. Remote left MCA territory infarct.   Impression/Plan:  Malik Charter is here with AMS, acute on chronic left SDH and new PE. His exam does not fit with the SDH and he needs neurology evaluation to rule out partial seizure activity or TIA/recurrence of stroke symptoms. Given stability on scan, no surgery is needed at this time. He is OK for DVT prophylaxis at this time but for anticoagulation for treatment of PE, recommend 48 hours to initiate after stable scan. Would start with low PTT goal (60-80) on heparin infusion and follow up scan 48 hours after starting that to again ensure stability.   1.  Diagnosis: Subacute SDH  2.  Plan - Can start Keppra 500mg  BID x 7 days for seizure prophylaxis, recommend neurology for eval of any seizures or stroke concerns - Repeat CT on 12/7 AM, if stable, can start heparin infusion with goal ptt 60-80, no loading dose We can follow along  Deetta Perla, MD Neurosurgery

## 2021-02-12 NOTE — Progress Notes (Signed)
*  PRELIMINARY RESULTS* Echocardiogram 2D Echocardiogram has been performed.  Malik [name] Margorie Renner 02/12/2021, 12:00 PM

## 2021-02-12 NOTE — Progress Notes (Signed)
   02/12/21 0930  Clinical Encounter Type  Visited With Patient  Visit Type Initial;Spiritual support;Social support  Referral From Chaplain  Consult/Referral To [employer] visited PT and daughter at bedside. Chaplain established initial spiritual care. Chaplain offered emotional and spiritual support.   Andee Poles, MDiv

## 2021-02-12 NOTE — Progress Notes (Signed)
Pharmacy Antibiotic Note  Malik [name] is a 75 y.o. male admitted on 02/11/2021 with aspiration pneumonia.  Pharmacy has been consulted for Zosyn dosing.  Plan: Zosyn 3.375g IV q8h (4 hour infusion).  Pharmacy will continue to follow and adjust does when warranted.  Height: 5\' 8"  (172.7 cm) Weight: 81.6 kg (180 lb) IBW/kg (Calculated) : 68.4  Temp (24hrs), Avg:97.6 F (36.4 C), Min:97.5 F (36.4 C), Max:97.6 F (36.4 C)  Recent Labs  Lab 02/08/21 1457 02/09/21 1816 02/11/21 1559 02/11/21 1943 02/11/21 2148  WBC 11.9* 14.0* 17.8*  --   --   CREATININE 1.05 1.13 1.21  --   --   LATICACIDVEN  --   --   --  1.4 1.4    Estimated Creatinine Clearance: 50.2 mL/min (by C-G formula based on SCr of 1.21 mg/dL).    No Known Allergies  Antimicrobials this admission: 12/4 Azithromycin >>  12/4 Ceftriaxone >> x 1 12/5 Zosyn >>  Microbiology results: 12/4 BCx: Pending  Thank you for allowing pharmacy to be a part of this patient's care.  Renda Rolls, PharmD, [facility] 02/12/2021 1:47 AM

## 2021-02-12 NOTE — Evaluation (Signed)
Clinical/Bedside Swallow Evaluation [name] Details  Name: Malik [name] MRN: [id number] Date of Birth: 05/07/1945  Today's Date: 02/12/2021 Time: SLP Start Time (ACUTE ONLY): 1638 SLP Stop Time (ACUTE ONLY): 4536 SLP Time Calculation (min) (ACUTE ONLY): 26 min  Past Medical History:  Past Medical History:  Diagnosis Date   Angina of effort ([place])    Chronic diastolic CHF (congestive heart failure) ([facility])    CVA (cerebral vascular accident) ([place]) 08/10/2019   right sided weakness   Diabetes ([place])    Diabetes mellitus without complication ([place])    Dilated cardiomyopathy ([place])    Heart disease    High blood pressure    Hypertension    LVH (left ventricular hypertrophy) due to hypertensive disease    Moderate mitral insufficiency    Neuropathy    with LE weakness   Sleep apnea    TBI (traumatic brain injury)    Past Surgical History:  Past Surgical History:  Procedure Laterality Date   BACK SURGERY     ESOPHAGOGASTRODUODENOSCOPY (EGD) WITH PROPOFOL N/A 10/02/2019   Procedure: ESOPHAGOGASTRODUODENOSCOPY (EGD) WITH PROPOFOL;  Surgeon: Irene Shipper, MD;  Location: [facility] ENDOSCOPY;  Service: Endoscopy;  Laterality: N/A;   IR CT HEAD LTD  08/10/2019   IR PERCUTANEOUS ART THROMBECTOMY/INFUSION INTRACRANIAL INC DIAG ANGIO  08/10/2019   RADIOLOGY WITH ANESTHESIA N/A 08/10/2019   Procedure: IR WITH ANESTHESIA -CODE STROKE;  Surgeon: Radiologist, Medication, MD;  Location: [place];  Service: Radiology;  Laterality: N/A;   SPLENECTOMY, 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 male with medical history significant for CVA status post thrombectomy approximately 2 years ago, dementia, non-insulin-dependent diabetes mellitus, hypertension, GERD, who presents emergency department from home for chief concerns of shortness of  breath and worsening altered mental status.     Per son, Malik [name] who is Malik legal healthcare power of attorney, states that [name] fell off Malik bed on Tuesday.  When family found him, he was on his knees.  Since Tuesday, 02/06/2021, he has been mostly bedbound.     At bedside [name] speech is garbled and difficult to understand.  [name] is able to follow commands and squeeze my fingers using his left hand, he is able to move his bilateral lower extremities without any difficulty.  He opens his mouth at my request.  He squeezes his eye tightly shut when I utilize a flashlight pointing it out his eyes.      Of note his right upper extremity, has fingers that appear to be contracted and he is not able to follow my commands by squeezing my hands utilizing his right upper extremities.  This appears to be chronic." Most recent Head CT, 02/12/21, "1. No change in a 12 mm thick mixed density left cerebral convexity  subdural hemorrhage. No significant or progressive mass effect.  2. Remote left MCA territory infarct." CT on admission, "1. Bilateral peripheral segmental and subsegmental pulmonary emboli;  small clot burden. No evidence of heart strain.  2. Bibasilar atelectasis. No definite pulmonary infarcts.  3. No acute abnormalities within Malik abdomen or pelvis.  4. Large heterogeneous calcifications containing right thyroid mass  extends into Malik anterior mediastinum measures up to 5.6 cm.  Recommend thyroid US (ref: J Am Coll Radiol. 2015 Feb;12(2):  143-50).  5. Aortic atherosclerosis."  Assessment / Plan / Recommendation  Clinical Impression  Pt seen for clinical swallowing evaluation. Pt lethargic, but able to rouse for safe PO intake. Limited verbalizations. Nodding yes/no appropriately to basic/biographical questions. Speech c/b decreased vocal loudness and decreased speech intelligibility. Wordfinding difficulty noted. ?pt's baseline communication status as pt with hx of aphasia. On  2L/min O2 via [place]. Cleared with RN.   Per chart review, pt seen by SLP during several past admissions. Most recently seen by SLP on 03/21/20 with recommendation for mech soft diet with thin liquids (see note for details).  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 Thick, ropy secretions noted in oral cavity. Oral care provided. Concern for reduced secretion management.   Pt given trials of ice chips (x3), thin liquids (x3 tsp), nectar-thick liquids (x3 tsp; x1 cup sip), and applesauce (x1 tsp). Across trials, pt with s/sx oral dysphagia and s/sx concerning for highly suspected pharyngeal dysphagia. Pt with oral holding/delayed oral transit with all trials. Pt with delayed coughing with trials of ice, nectar-thick liquids (via cup sip), and puree. Immediate cough with thin liquids via tsp. Multiple swallows appreciated with thin liquids. Difficulty assessing post-swallow vocal quality given limited verbalizations. No changes to vitals noted across trials. Further POs deferred due to severity of pt's clinical presentation.   A safe oral diet cannot be recommended at this time. Recommend NPO with consideration for short term alternate route of nutrition/hydration/medication. Pt is at increased risk for aspiration/aspiration PNA in setting of SDH, hx of stroke, multiple medical comorbidites, overall deconditioned state (bedbound at baseline), reduced respiratory status, and waxing/waning LOA.  SLP to f/u per POC for clinical swallowing re-evaluation.   Anticipate need for constant/frequent supervision and continued SLP services at d/c.   Results of assessment, SLP recommendations, and SLP POC discussed with pt. Pt nodding in agreement, ?full understanding. RN and MD made aware as well.   SLP Visit Diagnosis: Dysphagia, oropharyngeal phase (R13.12)    Aspiration Risk  Severe aspiration risk;Risk for inadequate nutrition/hydration    Diet Recommendation  NPO;Alternative means - temporary   Medication Administration: Via alternative means    Other  Recommendations Oral Care Recommendations: Oral care QID Other Recommendations: Have oral suction available    Recommendations for follow up therapy are one component of a multi-disciplinary discharge planning process, led by Malik attending physician.  Recommendations may be updated based on [name] status, additional functional criteria and insurance authorization.  Follow up Recommendations  (TBD)      Assistance Recommended at Discharge Frequent or constant Supervision/Assistance  Functional Status Assessment [name] has had a recent decline in their functional status and demonstrates Malik ability to make significant improvements in function in a reasonable and predictable amount of time.  Frequency and Duration min 2x/week  2 weeks       Prognosis Prognosis for Safe Diet Advancement: Fair Barriers to Reach Goals: Language deficits;Severity of deficits      Swallow Study   General Date of Onset: 02/11/21 HPI: Per 54 H&P "Malik [name] is a 75 y.o. male with medical history significant for CVA status post thrombectomy approximately 2 years ago, dementia, non-insulin-dependent diabetes mellitus, hypertension, GERD, who presents emergency department from home for chief concerns of shortness of breath and worsening altered mental status.     Per son, [name] who is Malik legal healthcare power of attorney, states that [name] fell off Malik bed on Tuesday.  When family found him, he was  on his knees.  Since Tuesday, 02/06/2021, he has been mostly bedbound.     At bedside [name] speech is garbled and difficult to understand.  [name] is able to follow commands and squeeze my fingers using his left hand, he is able to move his bilateral lower extremities without any difficulty.  He opens his mouth at my request.  He squeezes his eye tightly shut when I utilize a flashlight  pointing it out his eyes.      Of note his right upper extremity, has fingers that appear to be contracted and he is not able to follow my commands by squeezing my hands utilizing his right upper extremities.  This appears to be chronic." Most recent Head CT, 02/12/21, "1. No change in a 12 mm thick mixed density left cerebral convexity  subdural hemorrhage. No significant or progressive mass effect.  2. Remote left MCA territory infarct." CT on admission, "1. Bilateral peripheral segmental and subsegmental pulmonary emboli;  small clot burden. No evidence of heart strain.  2. Bibasilar atelectasis. No definite pulmonary infarcts.  3. No acute abnormalities within Malik abdomen or pelvis.  4. Large heterogeneous calcifications containing right thyroid mass  extends into Malik anterior mediastinum measures up to 5.6 cm.  Recommend thyroid US (ref: J Am Coll Radiol. 2015 Feb;12(2):  143-50).  5. Aortic atherosclerosis." Type of Study: Bedside Swallow Evaluation Previous Swallow Assessment: Pt seen by SLP services during previous hospitalizations. Most recently, pt seen 03/21/20, with recommendation for mech soft diet with thin liquids (see not for details). Diet Prior to this Study: NPO Temperature Spikes Noted: No Respiratory Status: Nasal cannula (2L/min) Behavior/Cognition: Lethargic/Drowsy;Alert Oral Cavity Assessment: Excessive secretions Oral Care Completed by SLP: Yes Vision: Functional for self-feeding Self-Feeding Abilities: Needs assist [name] Positioning: Upright in bed Baseline Vocal Quality: Low vocal intensity Volitional Cough: Cognitively unable to elicit Volitional Swallow: Unable to elicit    Oral/Motor/Sensory Function Overall Oral Motor/Sensory Function: Moderate impairment Facial ROM: Reduced right;Reduced left (vs effort/ability to follow commands) Lingual ROM: Within Functional Limits Lingual Symmetry: Within Functional Limits Lingual Strength:  (unable to follow command)   Ice  Chips Ice chips: Impaired Presentation: Spoon Oral Phase Impairments: Reduced lingual movement/coordination Oral Phase Functional Implications: Prolonged oral transit;Oral holding Pharyngeal Phase Impairments: Cough - Delayed   Thin Liquid Thin Liquid: Impaired Presentation: Spoon Oral Phase Impairments: Reduced lingual movement/coordination Oral Phase Functional Implications: Oral holding;Prolonged oral transit Pharyngeal  Phase Impairments: Multiple swallows;Cough - Immediate    Nectar Thick Nectar Thick Liquid: Impaired Presentation: Spoon;Cup Oral Phase Impairments: Reduced lingual movement/coordination;Poor awareness of bolus Oral phase functional implications: Oral holding;Prolonged oral transit Pharyngeal Phase Impairments: Cough - Immediate (with cup sip only) Other Comments: pt fed self from cup   Honey Thick DNT  Puree Puree: Impaired Presentation: Self Fed Oral Phase Impairments: Reduced lingual movement/coordination;Poor awareness of bolus Oral Phase Functional Implications: Prolonged oral transit;Oral holding Pharyngeal Phase Impairments: Cough - Delayed   Solid     Deferred     Cherrie Gauze, M.S., [facility] [phone] (ASCOM)   Clearnce Sorrel Lanayah Gartley 02/12/2021,11:17 AM

## 2021-02-12 NOTE — Progress Notes (Signed)
[facility] NAME: Malik [name]    MR#:  [id number]  DATE OF BIRTH:  1945-11-06  SUBJECTIVE:  came in after patient was found at the side of his bed when he fell. Daughter Alleen Borne at bedside. Patient was found to have subdural hematoma and workup further showed bilateral PE, aspiration pneumonia and low hemoglobin. Patient is having some emotional outburst. During earlier patient was getting echo done. Not much verbal communication.  REVIEW OF SYSTEMS:   Review of Systems  Unable to perform ROS: Medical condition  Tolerating Diet: Tolerating PT:   DRUG ALLERGIES:  No Known Allergies  VITALS:  Blood pressure (!) 146/74, pulse 69, temperature 98 F (36.7 C), temperature source Oral, resp. rate 17, height 5\' 8"  (1.727 m), weight 81.6 kg, SpO2 100 %.  PHYSICAL EXAMINATION:   Physical Exam  GENERAL:  75 y.o.-year-old patient lying in the bed with no acute distress. Appears chronically ill HEENT: Head atraumatic, normocephalic. Oropharynx and nasopharynx clear.  LUNGS: decreased breath sounds bilaterally, no wheezing, rales, rhonchi. No use of accessory muscles of respiration.  CARDIOVASCULAR: S1, S2 normal. No murmurs, rubs, or gallops.  ABDOMEN: Soft, nontender, nondistended. Bowel sounds present. No organomegaly or mass.  EXTREMITIES: No cyanosis, clubbing or edema b/l.    NEUROLOGIC: right sided hemiparesis, expressive aphasia.  PSYCHIATRIC:  patient is alert and intermittently has emotional outburst SKIN: No obvious rash, lesion, or ulcer.   LABORATORY PANEL:  CBC Recent Labs  Lab 02/12/21 0416  WBC 14.7*  HGB 6.7*  HCT 23.0*  PLT 495*    Chemistries  Recent Labs  Lab 02/11/21 1559 02/12/21 0416  NA 141 145  K 3.8 3.6  CL 112* 114*  CO2 24 24  GLUCOSE 132* 137*  BUN 20 15  CREATININE 1.21 0.91  CALCIUM 8.8* 8.7*  AST 23  --   ALT 27  --   ALKPHOS 97  --   BILITOT 0.6  --    Cardiac Enzymes No  results for input(s): TROPONINI in the last 168 hours. RADIOLOGY:  CT HEAD WO CONTRAST (5MM)  Result Date: 02/12/2021 CLINICAL DATA:  Headache, intracranial hemorrhage suspected f/u initial for stability EXAM: CT HEAD WITHOUT CONTRAST TECHNIQUE: Contiguous axial images were obtained from the base of the skull through the vertex without intravenous contrast. COMPARISON:  02/11/2021. FINDINGS: Brain: No change in size of a 12 mm thick mixed density left cerebral convexity subdural hemorrhage with small areas of internal hyperdensity. Similar mild mass effect without midline shift. Similar remote left MCA territory infarct with encephalomalacia and gliosis. Additional scattered white matter hypodensities, nonspecific but compatible with chronic microvascular ischemic disease. No evidence of new/interval acute large vascular territory infarct. No mass lesion. Vascular: No hyperdense vessel identified. Calcific intracranial atherosclerosis. Skull: No acute fracture. Sinuses/Orbits: No acute finding. Other: No mastoid effusions. IMPRESSION: 1. No change in a 12 mm thick mixed density left cerebral convexity subdural hemorrhage. No significant or progressive mass effect. 2. Remote left MCA territory infarct. Electronically Signed   By: Margaretha Sheffield M.D.   On: 02/12/2021 04:17   CT HEAD WO CONTRAST (5MM)  Result Date: 02/11/2021 CLINICAL DATA:  Mental status change unknown cause, face pain. EXAM: CT HEAD WITHOUT CONTRAST CT CERVICAL SPINE WITHOUT CONTRAST TECHNIQUE: Multidetector CT imaging of the head and cervical spine was performed following the standard protocol without intravenous contrast. Multiplanar CT image reconstructions of the cervical spine were also generated. COMPARISON:  October 31, 2020.  FINDINGS: CT HEAD FINDINGS Brain: Increased size of a now mixed density near whole hemispheric left subdural collection, measuring 12 mm in thickness with a few hyperdense areas within the collection for  instance on image 25/2. This is Merry Proud with radiology on trying to reach whoever taken Caris sandal black wall vacuum the doctor please dex. Similar appearance of the large left hemispheric remote infarct and encephalomalacia. Stable age related global parenchymal volume loss with ex vacuo dilatation of ventricular system. Similar mild burden of chronic ischemic white matter disease. No hydrocephalus. Vascular: No hyperdense vessel. Atherosclerotic calcifications of the internal carotid arteries at the skull base. Skull: Negative for fracture or focal lesion. Sinuses/Orbits: Mild mucosal thickening in the bilateral maxillary sinuses and ethmoid air cells. Orbits are grossly unremarkable. Other: Mastoid air cells are predominantly clear. CT CERVICAL SPINE FINDINGS Alignment: Straightening of the normal cervical lordosis. No acute subluxation. Skull base and vertebrae: No acute fracture. Diffuse demineralization of bone. 1 cm sclerotic focus in the right first rib is nonspecific but favored to reflect a bone island. Soft tissues and spinal canal: No prevertebral fluid or swelling. No visible canal hematoma. Disc levels: Multilevel degenerative change with disc space narrowing and endplate irregularity. Upper chest: Biapical emphysematous change. Other: Partially visualized large multinodular goiter previously evaluated with thyroid ultrasound on October 29, 2019. IMPRESSION: 1. Increased size of a now mixed density near whole hemispheric left subdural collection measuring 12 mm in maximal thickness, favored to reflect a subdural hematoma which is technically age indeterminate but overall appears subacute, however there is a hyperdense area particularly along the posterior aspect which may reflect more acute blood products. No significant associated mass effect. 2. Similar appearance of the large left hemispheric remote infarct and encephalomalacia. 3. No acute fracture or subluxation of the cervical spine. 4. Partially  visualized large multinodular goiter previously evaluated with thyroid ultrasound on October 29, 2019. These results were called by telephone at the time of interpretation on 02/11/2021 at 8:42 pm to provider [facility] , who verbally acknowledged these results. Electronically Signed   By: Dahlia Bailiff M.D.   On: 02/11/2021 20:45   CT Angio Chest PE W and/or Wo Contrast  Addendum Date: 02/11/2021   ADDENDUM REPORT: 02/11/2021 23:16 ADDENDUM: The RV/LV ratio is calculated at 0.9. Electronically Signed   By: Fidela Salisbury M.D.   On: 02/11/2021 23:16   Result Date: 02/11/2021 CLINICAL DATA:  Suspected PE. Abdominal pain. EXAM: CT ANGIOGRAPHY CHEST CT ABDOMEN AND PELVIS WITH CONTRAST TECHNIQUE: Multidetector CT imaging of the chest was performed using the standard protocol during bolus administration of intravenous contrast. Multiplanar CT image reconstructions and MIPs were obtained to evaluate the vascular anatomy. Multidetector CT imaging of the abdomen and pelvis was performed using the standard protocol during bolus administration of intravenous contrast. CONTRAST:  168mL OMNIPAQUE IOHEXOL 350 MG/ML SOLN COMPARISON:  September 30, 2019 FINDINGS: CTA CHEST FINDINGS Cardiovascular: Somewhat suboptimal opacification of the segmental pulmonary arteries. Streak artifact from the right arm down. There are bilateral peripheral segmental and subsegmental pulmonary emboli; small clot burden. No evidence of heart strain. Mediastinum/Nodes: Large heterogeneous containing calcifications right thyroid mass extends into the anterior mediastinum and measures approximately 4.7 by 4.2 by 5.6 cm. No definite mediastinal lymphadenopathy. Trachea and esophagus are normal. Lungs/Pleura: Bibasilar atelectasis. No definite pulmonary infarcts. Musculoskeletal: No chest wall abnormality. No acute or significant osseous findings. Review of the MIP images confirms the above findings. CT ABDOMEN and PELVIS FINDINGS Hepatobiliary: No  focal liver abnormality  is seen. No gallstones, gallbladder wall thickening, or biliary dilatation. Pancreas: Unremarkable. No pancreatic ductal dilatation or surrounding inflammatory changes. Spleen: Surgically absent. Adrenals/Urinary Tract: Normal adrenal glands. Bilateral renal cysts. Normal urinary bladder. Stomach/Bowel: Stomach is within normal limits. Appendix appears normal. No evidence of bowel wall thickening, distention, or inflammatory changes. Vascular/Lymphatic: Aortic atherosclerosis. No enlarged abdominal or pelvic lymph nodes. Reproductive: Prostate is unremarkable. Other: No abdominal wall hernia or abnormality. No abdominopelvic ascites. Musculoskeletal: No acute or significant osseous findings. Review of the MIP images confirms the above findings. IMPRESSION: 1. Bilateral peripheral segmental and subsegmental pulmonary emboli; small clot burden. No evidence of heart strain. 2. Bibasilar atelectasis. No definite pulmonary infarcts. 3. No acute abnormalities within the abdomen or pelvis. 4. Large heterogeneous calcifications containing right thyroid mass extends into the anterior mediastinum measures up to 5.6 cm. Recommend thyroid US (ref: J Am Coll Radiol. 2015 Feb;12(2): 143-50). 5. Aortic atherosclerosis. Aortic Atherosclerosis (ICD10-I70.0). Electronically Signed: By: Fidela Salisbury M.D. On: 02/11/2021 20:32   CT Cervical Spine Wo Contrast  Result Date: 02/11/2021 CLINICAL DATA:  Mental status change unknown cause, face pain. EXAM: CT HEAD WITHOUT CONTRAST CT CERVICAL SPINE WITHOUT CONTRAST TECHNIQUE: Multidetector CT imaging of the head and cervical spine was performed following the standard protocol without intravenous contrast. Multiplanar CT image reconstructions of the cervical spine were also generated. COMPARISON:  October 31, 2020. FINDINGS: CT HEAD FINDINGS Brain: Increased size of a now mixed density near whole hemispheric left subdural collection, measuring 12 mm in  thickness with a few hyperdense areas within the collection for instance on image 25/2. This is Merry Proud with radiology on trying to reach whoever taken Caris sandal black wall vacuum the doctor please dex. Similar appearance of the large left hemispheric remote infarct and encephalomalacia. Stable age related global parenchymal volume loss with ex vacuo dilatation of ventricular system. Similar mild burden of chronic ischemic white matter disease. No hydrocephalus. Vascular: No hyperdense vessel. Atherosclerotic calcifications of the internal carotid arteries at the skull base. Skull: Negative for fracture or focal lesion. Sinuses/Orbits: Mild mucosal thickening in the bilateral maxillary sinuses and ethmoid air cells. Orbits are grossly unremarkable. Other: Mastoid air cells are predominantly clear. CT CERVICAL SPINE FINDINGS Alignment: Straightening of the normal cervical lordosis. No acute subluxation. Skull base and vertebrae: No acute fracture. Diffuse demineralization of bone. 1 cm sclerotic focus in the right first rib is nonspecific but favored to reflect a bone island. Soft tissues and spinal canal: No prevertebral fluid or swelling. No visible canal hematoma. Disc levels: Multilevel degenerative change with disc space narrowing and endplate irregularity. Upper chest: Biapical emphysematous change. Other: Partially visualized large multinodular goiter previously evaluated with thyroid ultrasound on October 29, 2019. IMPRESSION: 1. Increased size of a now mixed density near whole hemispheric left subdural collection measuring 12 mm in maximal thickness, favored to reflect a subdural hematoma which is technically age indeterminate but overall appears subacute, however there is a hyperdense area particularly along the posterior aspect which may reflect more acute blood products. No significant associated mass effect. 2. Similar appearance of the large left hemispheric remote infarct and encephalomalacia. 3. No  acute fracture or subluxation of the cervical spine. 4. Partially visualized large multinodular goiter previously evaluated with thyroid ultrasound on October 29, 2019. These results were called by telephone at the time of interpretation on 02/11/2021 at 8:42 pm to provider [facility] , who verbally acknowledged these results. Electronically Signed   By: Dahlia Bailiff M.D.   On:  02/11/2021 20:45   CT ABDOMEN PELVIS W CONTRAST  Addendum Date: 02/11/2021   ADDENDUM REPORT: 02/11/2021 23:16 ADDENDUM: The RV/LV ratio is calculated at 0.9. Electronically Signed   By: Fidela Salisbury M.D.   On: 02/11/2021 23:16   Result Date: 02/11/2021 CLINICAL DATA:  Suspected PE. Abdominal pain. EXAM: CT ANGIOGRAPHY CHEST CT ABDOMEN AND PELVIS WITH CONTRAST TECHNIQUE: Multidetector CT imaging of the chest was performed using the standard protocol during bolus administration of intravenous contrast. Multiplanar CT image reconstructions and MIPs were obtained to evaluate the vascular anatomy. Multidetector CT imaging of the abdomen and pelvis was performed using the standard protocol during bolus administration of intravenous contrast. CONTRAST:  147mL OMNIPAQUE IOHEXOL 350 MG/ML SOLN COMPARISON:  September 30, 2019 FINDINGS: CTA CHEST FINDINGS Cardiovascular: Somewhat suboptimal opacification of the segmental pulmonary arteries. Streak artifact from the right arm down. There are bilateral peripheral segmental and subsegmental pulmonary emboli; small clot burden. No evidence of heart strain. Mediastinum/Nodes: Large heterogeneous containing calcifications right thyroid mass extends into the anterior mediastinum and measures approximately 4.7 by 4.2 by 5.6 cm. No definite mediastinal lymphadenopathy. Trachea and esophagus are normal. Lungs/Pleura: Bibasilar atelectasis. No definite pulmonary infarcts. Musculoskeletal: No chest wall abnormality. No acute or significant osseous findings. Review of the MIP images confirms the above  findings. CT ABDOMEN and PELVIS FINDINGS Hepatobiliary: No focal liver abnormality is seen. No gallstones, gallbladder wall thickening, or biliary dilatation. Pancreas: Unremarkable. No pancreatic ductal dilatation or surrounding inflammatory changes. Spleen: Surgically absent. Adrenals/Urinary Tract: Normal adrenal glands. Bilateral renal cysts. Normal urinary bladder. Stomach/Bowel: Stomach is within normal limits. Appendix appears normal. No evidence of bowel wall thickening, distention, or inflammatory changes. Vascular/Lymphatic: Aortic atherosclerosis. No enlarged abdominal or pelvic lymph nodes. Reproductive: Prostate is unremarkable. Other: No abdominal wall hernia or abnormality. No abdominopelvic ascites. Musculoskeletal: No acute or significant osseous findings. Review of the MIP images confirms the above findings. IMPRESSION: 1. Bilateral peripheral segmental and subsegmental pulmonary emboli; small clot burden. No evidence of heart strain. 2. Bibasilar atelectasis. No definite pulmonary infarcts. 3. No acute abnormalities within the abdomen or pelvis. 4. Large heterogeneous calcifications containing right thyroid mass extends into the anterior mediastinum measures up to 5.6 cm. Recommend thyroid US (ref: J Am Coll Radiol. 2015 Feb;12(2): 143-50). 5. Aortic atherosclerosis. Aortic Atherosclerosis (ICD10-I70.0). Electronically Signed: By: Fidela Salisbury M.D. On: 02/11/2021 20:32   US Venous Img Lower Bilateral  Result Date: 02/11/2021 CLINICAL DATA:  Known bilateral pulmonary emboli. EXAM: BILATERAL LOWER EXTREMITY VENOUS DOPPLER ULTRASOUND TECHNIQUE: Gray-scale sonography with graded compression, as well as color Doppler and duplex ultrasound were performed to evaluate the lower extremity deep venous systems from the level of the common femoral vein and including the common femoral, femoral, profunda femoral, popliteal and calf veins including the posterior tibial, peroneal and gastrocnemius  veins when visible. The superficial great saphenous vein was also interrogated. Spectral Doppler was utilized to evaluate flow at rest and with distal augmentation maneuvers in the common femoral, femoral and popliteal veins. COMPARISON:  None. FINDINGS: RIGHT LOWER EXTREMITY Common Femoral Vein: No evidence of thrombus. Normal compressibility, respiratory phasicity and response to augmentation. Saphenofemoral Junction: No evidence of thrombus. Normal compressibility and flow on color Doppler imaging. Profunda Femoral Vein: No evidence of thrombus. Normal compressibility and flow on color Doppler imaging. Femoral Vein: No evidence of thrombus. Normal compressibility, respiratory phasicity and response to augmentation. Popliteal Vein: No evidence of thrombus. Normal compressibility, respiratory phasicity and response to augmentation. Calf Veins: No evidence of thrombus.  Normal compressibility and flow on color Doppler imaging. Superficial Great Saphenous Vein: No evidence of thrombus. Normal compressibility. Venous Reflux:  None. Other Findings:  None. LEFT LOWER EXTREMITY Common Femoral Vein: No evidence of thrombus. Normal compressibility, respiratory phasicity and response to augmentation. Saphenofemoral Junction: No evidence of thrombus. Normal compressibility and flow on color Doppler imaging. Profunda Femoral Vein: No evidence of thrombus. Normal compressibility and flow on color Doppler imaging. Femoral Vein: No evidence of thrombus. Normal compressibility, respiratory phasicity and response to augmentation. Popliteal Vein: No evidence of thrombus. Normal compressibility, respiratory phasicity and response to augmentation. Calf Veins: No evidence of thrombus. Normal compressibility and flow on color Doppler imaging. Superficial Great Saphenous Vein: No evidence of thrombus. Normal compressibility. Venous Reflux:  None. Other Findings:  None. IMPRESSION: No evidence of deep venous thrombosis in either lower  extremity. Electronically Signed   By: Inez Catalina M.D.   On: 02/11/2021 21:42   DG Chest Portable 1 View  Result Date: 02/11/2021 CLINICAL DATA:  Shortness of breath EXAM: PORTABLE CHEST 1 VIEW COMPARISON:  02/08/2021 FINDINGS: Shallow inspiration with low lung volumes. Chronic interstitial changes with background emphysema. Superimposed patchy opacities at the lung bases. Pulmonary vascular congestion. No pleural effusion. No pneumothorax. Similar cardiomediastinal contours with cardiomegaly. IMPRESSION: Patchy atelectasis/consolidation at the lung bases superimposed on chronic interstitial changes. Pulmonary vascular congestion and cardiomegaly. Electronically Signed   By: Macy Mis M.D.   On: 02/11/2021 20:05   ECHOCARDIOGRAM COMPLETE  Result Date: 02/12/2021    ECHOCARDIOGRAM REPORT   Patient Name:   MONTEE TALLMAN Date of Exam: 02/12/2021 Medical Rec #:  [id number]         Height:       68.0 in Accession #:    [id number]        Weight:       180.0 lb Date of Birth:  June 12, 1945         BSA:          1.954 m Patient Age:    75 years          BP:           164/65 mmHg Patient Gender: M                 HR:           73 bpm. Exam Location:  [facility] Procedure: 2D Echo, Color Doppler and Cardiac Doppler Indications:     I26.09 Pulmonary Embolus  History:         Patient has prior history of Echocardiogram examinations. CHF                  and Dilated cardiomyopathy; Risk Factors:Diabetes, Hypertension                  and Sleep Apnea.  Sonographer:     Charmayne Sheer Referring Phys:  [id number] AMY N COX Diagnosing Phys: Kathlyn Sacramento MD  Sonographer Comments: Suboptimal apical window. IMPRESSIONS  1. Left ventricular ejection fraction, by estimation, is 60 to 65%. The left ventricle has normal function. The left ventricle has no regional wall motion abnormalities. There is moderate left ventricular hypertrophy. Left ventricular diastolic parameters are consistent with Grade I diastolic dysfunction (impaired  relaxation).  2. Right ventricular systolic function is normal. The right ventricular size is normal. Tricuspid regurgitation signal is inadequate for assessing PA pressure.  3. Left atrial size was mildly dilated.  4. The mitral valve is normal in structure. No evidence  of mitral valve regurgitation. No evidence of mitral stenosis.  5. The aortic valve is normal in structure. Aortic valve regurgitation is not visualized. Aortic valve sclerosis is present, with no evidence of aortic valve stenosis.  6. The inferior vena cava is dilated in size with >50% respiratory variability, suggesting right atrial pressure of 8 mmHg. FINDINGS  Left Ventricle: Left ventricular ejection fraction, by estimation, is 60 to 65%. The left ventricle has normal function. The left ventricle has no regional wall motion abnormalities. The left ventricular internal cavity size was normal in size. There is  moderate left ventricular hypertrophy. Left ventricular diastolic parameters are consistent with Grade I diastolic dysfunction (impaired relaxation). Right Ventricle: The right ventricular size is normal. No increase in right ventricular wall thickness. Right ventricular systolic function is normal. Tricuspid regurgitation signal is inadequate for assessing PA pressure. Left Atrium: Left atrial size was mildly dilated. Right Atrium: Right atrial size was normal in size. Pericardium: There is no evidence of pericardial effusion. Mitral Valve: The mitral valve is normal in structure. No evidence of mitral valve regurgitation. No evidence of mitral valve stenosis. MV peak gradient, 4.8 mmHg. The mean mitral valve gradient is 2.0 mmHg. Tricuspid Valve: The tricuspid valve is normal in structure. Tricuspid valve regurgitation is trivial. No evidence of tricuspid stenosis. Aortic Valve: The aortic valve is normal in structure. Aortic valve regurgitation is not visualized. Aortic valve sclerosis is present, with no evidence of aortic valve  stenosis. Aortic valve mean gradient measures 5.0 mmHg. Aortic valve peak gradient measures 9.7 mmHg. Aortic valve area, by VTI measures 2.34 cm. Pulmonic Valve: The pulmonic valve was normal in structure. Pulmonic valve regurgitation is mild. No evidence of pulmonic stenosis. Aorta: The aortic root is normal in size and structure. Venous: The inferior vena cava is dilated in size with greater than 50% respiratory variability, suggesting right atrial pressure of 8 mmHg. IAS/Shunts: No atrial level shunt detected by color flow Doppler.  LEFT VENTRICLE PLAX 2D LVIDd:         4.79 cm   Diastology LVIDs:         3.26 cm   LV e' medial:    5.00 cm/s LV PW:         1.28 cm   LV E/e' medial:  15.2 LV IVS:        1.00 cm   LV e' lateral:   10.60 cm/s LVOT diam:     1.90 cm   LV E/e' lateral: 7.2 LV SV:         66 LV SV Index:   34 LVOT Area:     2.84 cm  RIGHT VENTRICLE RV Basal diam:  2.44 cm RV S prime:     16.00 cm/s LEFT ATRIUM             Index        RIGHT ATRIUM           Index LA diam:        5.20 cm 2.66 cm/m   RA Area:     12.80 cm LA Vol (A2C):   57.7 ml 29.53 ml/m  RA Volume:   29.30 ml  14.99 ml/m LA Vol (A4C):   83.8 ml 42.88 ml/m LA Biplane Vol: 69.6 ml 35.61 ml/m  AORTIC VALVE                     PULMONIC VALVE AV Area (Vmax):    1.98 cm  PV Vmax:       1.49 m/s AV Area (Vmean):   2.01 cm      PV Vmean:      92.900 cm/s AV Area (VTI):     2.34 cm      PV VTI:        0.257 m AV Vmax:           156.00 cm/s   PV Peak grad:  8.9 mmHg AV Vmean:          104.000 cm/s  PV Mean grad:  4.0 mmHg AV VTI:            0.283 m AV Peak Grad:      9.7 mmHg AV Mean Grad:      5.0 mmHg LVOT Vmax:         109.00 cm/s LVOT Vmean:        73.800 cm/s LVOT VTI:          0.234 m LVOT/AV VTI ratio: 0.83  AORTA Ao Root diam: 3.60 cm MITRAL VALVE MV Area (PHT): 2.92 cm    SHUNTS MV Area VTI:   2.27 cm    Systemic VTI:  0.23 m MV Peak grad:  4.8 mmHg    Systemic Diam: 1.90 cm MV Mean grad:  2.0 mmHg MV Vmax:       1.10  m/s MV Vmean:      63.5 cm/s MV Decel Time: 260 msec MV E velocity: 75.80 cm/s MV A velocity: 96.40 cm/s MV E/A ratio:  0.79 Kathlyn Sacramento MD Electronically signed by Kathlyn Sacramento MD Signature Date/Time: 02/12/2021/12:21:11 PM    Final    ASSESSMENT AND PLAN:  Macklin Jacquin is a 75 y.o. male with medical history significant for CVA status post thrombectomy approximately 2 years ago, dementia, non-insulin-dependent diabetes mellitus, hypertension, GERD, who presents emergency department from home for chief concerns of shortness of breath and worsening  mental status.  Acute metabolic encephalopathy secondary to all hematoma with history of fall at home. -- Neurosurgery consultation with Dr. Dixie Dials and Dr. Lacinda Axon -- repeat CT head no acute change -- Dr. Lacinda Axon recommends repeat CT head on 02/14/2021 to determine if patient is appropriate for starting heparin drip for treatment of PE -- patient has history of stroke about two years ago. 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 consultation if needed -- Per daughter patient was not on any blood thinners at home  acute respiratory distress secondary to aspiration pneumonia -- x-ray shows patchy infiltrate by basilar lobes -- patient on IV antibiotics Unasyn -- seen by speech therapy recommends NPO. Patient has overt sign symptoms of aspiration -- will need to consider other modes of feeding  late effects of CVA,status post  thrombectomy approximately 2 years ago per family -- patient has emotional outbursts with right sided hemiparesis. He does have garbled speech  non insulin dependent diabetes -- continue sliding scale insulin since patient is NPO  Hypertension -- holding PO meds -- IV  PRN hydralazine   Procedures: Family communication : daughter Alleen Borne at bedside Consults : neurosurgery CODE STATUS: full DVT Prophylaxis : SCD Level of care: Stepdown Status is: Inpatient  Remains inpatient appropriate because: PE, subdural hematoma, anemia, aspiration pneumonia  patient has multiple comorbidities. He is quite sick. Palliative care consultation placed TOC for discharge planning PT OT to see patient     TOTAL TIME TAKING CARE OF THIS PATIENT: 35 minutes.  >50% time spent on counselling and coordination of care  Note: This dictation was prepared with Dragon dictation along with smaller phrase technology. Any transcriptional errors that result from this process are unintentional.  Fritzi Mandes M.D    [facility]   CC: Primary care physician; Donnie Coffin, MD Patient ID: Benjamine Strout, male   DOB: May 04, 1945, 75 y.o.   MRN: [id number]

## 2021-02-13 ENCOUNTER — Inpatient Hospital Stay: Payer: Medicare Other

## 2021-02-13 DIAGNOSIS — I2699 Other pulmonary embolism without acute cor pulmonale: Secondary | ICD-10-CM

## 2021-02-13 DIAGNOSIS — D631 Anemia in chronic kidney disease: Secondary | ICD-10-CM

## 2021-02-13 DIAGNOSIS — E785 Hyperlipidemia, unspecified: Secondary | ICD-10-CM

## 2021-02-13 DIAGNOSIS — N1832 Chronic kidney disease, stage 3b: Secondary | ICD-10-CM

## 2021-02-13 DIAGNOSIS — I10 Essential (primary) hypertension: Secondary | ICD-10-CM

## 2021-02-13 LAB — GLUCOSE, CAPILLARY
Glucose-Capillary: 108 mg/dL — ABNORMAL HIGH (ref 70–99)
Glucose-Capillary: 114 mg/dL — ABNORMAL HIGH (ref 70–99)
Glucose-Capillary: 118 mg/dL — ABNORMAL HIGH (ref 70–99)
Glucose-Capillary: 120 mg/dL — ABNORMAL HIGH (ref 70–99)
Glucose-Capillary: 90 mg/dL (ref 70–99)
Glucose-Capillary: 99 mg/dL (ref 70–99)

## 2021-02-13 LAB — TYPE AND SCREEN
ABO/RH(D): O POS
Antibody Screen: NEGATIVE
Unit division: 0

## 2021-02-13 LAB — BPAM RBC
Blood Product Expiration Date: 202212282359
ISSUE DATE / TIME: 202212051808
Unit Type and Rh: 5100

## 2021-02-13 LAB — CBC
HCT: 27.6 % — ABNORMAL LOW (ref 39.0–52.0)
Hemoglobin: 8.4 g/dL — ABNORMAL LOW (ref 13.0–17.0)
MCH: 21.1 pg — ABNORMAL LOW (ref 26.0–34.0)
MCHC: 30.4 g/dL (ref 30.0–36.0)
MCV: 69.3 fL — ABNORMAL LOW (ref 80.0–100.0)
Platelets: 483 10*3/uL — ABNORMAL HIGH (ref 150–400)
RBC: 3.98 MIL/uL — ABNORMAL LOW (ref 4.22–5.81)
RDW: 20.3 % — ABNORMAL HIGH (ref 11.5–15.5)
WBC: 13.8 10*3/uL — ABNORMAL HIGH (ref 4.0–10.5)
nRBC: 0.4 % — ABNORMAL HIGH (ref 0.0–0.2)

## 2021-02-13 MED ORDER — CLONIDINE HCL 0.2 MG/24HR TD PTWK
0.2000 mg | MEDICATED_PATCH | TRANSDERMAL | Status: DC
  Administered 2021-02-13: 0.2 mg via TRANSDERMAL
  Filled 2021-02-13: qty 1

## 2021-02-13 MED ORDER — INSULIN ASPART 100 UNIT/ML IJ SOLN
0.0000 [IU] | INTRAMUSCULAR | Status: DC
Start: 2021-02-13 — End: 2021-02-17
  Administered 2021-02-14: 22:00:00 3 [IU] via SUBCUTANEOUS
  Administered 2021-02-15: 22:00:00 1 [IU] via SUBCUTANEOUS
  Administered 2021-02-15: 12:00:00 2 [IU] via SUBCUTANEOUS
  Administered 2021-02-15: 1 [IU] via SUBCUTANEOUS
  Administered 2021-02-16: 3 [IU] via SUBCUTANEOUS
  Administered 2021-02-16: 10:00:00 1 [IU] via SUBCUTANEOUS
  Filled 2021-02-13 (×6): qty 1

## 2021-02-13 NOTE — Progress Notes (Signed)
Speech Language Pathology Treatment: Dysphagia  Patient Details Name: Malik [name] MRN: [id number] DOB: December 04, 1945 Today's Date: 02/13/2021 Time: 0830-0910 SLP Time Calculation (min) (ACUTE ONLY): 40 min  Assessment / Plan / Recommendation Clinical Impression  Pt seen for ongoing assessment of swallowing in hopes to upgrade to an oral diet. Pt drowsy/lethargic, but awakened adequately to verbal/tactile stim for participation in po trials. Limited verbalizations; (?)oral apraxia as noted w/ Oral Care as well. Nodding yes/no to few basic questions re: self but not overly engaged. Unsure of pt's baseline cognitive-communication status as pt has h/o aphasia. On [place] O2 2L.      Oral care provided by this Clinician -- less secretions noted. Pt given trials of ice chips (x5), nectar-thick liquids (x5 tsp), and applesauce (x1 tsp) w/ oral suction available. Pt exhibited both oral phase deficits and pharyngeal phase deficits across all trials c/b oral holding/delayed oral transit with all trials and disorganized bolus management. Pt demonstrated delayed coughing, multiple swallows and wet vocal quality during mumbled phonations/speech w/ Nectar liquids, ice chips, and puree. O2 sats remained in the low 90s during/post trials. No further po trials assessed d/t pt's presentation and risk for aspiration; pulmonary decline.    A safe oral diet cannot be recommended at this time. Pt appears at risk for aspiration w/ pulmonary decline d/t oropharyngeal phase dysphagia presented this session, and at his BSE. Pt is at increased risk for aspiration/aspiration PNA in setting of SDH, hx of stroke, multiple medical comorbidites, overall deconditioned state (bedbound at baseline), reduced respiratory status, and waxing/waning LOA. Recommend NPO status continue w/ discussion w/ Palliative Care for [place] including discussion of alternative route of nutrition/hydration/medication, most presently NGT.   ST services will  continue to f/u w/ ongoing assessment of oropharyngeal swallowing in hopes to establish least restrictive diet consistency safely and when pt is able to fully engage in oral intake. Recommend frequent oral care for hygiene and stimulation of swallowing; aspiration precautions. NSG and MD updated.        HPI HPI: Per 52 H&P "Malik [name] is a 75 y.o. male with medical history significant for CVA status post thrombectomy approximately 2 years ago, dementia, non-insulin-dependent diabetes mellitus, hypertension, GERD, who presents emergency department from home for chief concerns of shortness of breath and worsening altered mental status.     Per son, [name] who is the legal healthcare power of attorney, states that patient fell off the bed on Tuesday.  When family found him, he was on his knees.  Since Tuesday, 02/06/2021, he has been mostly bedbound.     At bedside patient speech is garbled and difficult to understand.  Patient is able to follow commands and squeeze my fingers using his left hand, he is able to move his bilateral lower extremities without any difficulty.  He opens his mouth at my request.  He squeezes his eye tightly shut when I utilize a flashlight pointing it out his eyes.      Of note his right upper extremity, has fingers that appear to be contracted and he is not able to follow my commands by squeezing my hands utilizing his right upper extremities.  This appears to be chronic." Most recent Head CT, 02/12/21, "1. No change in a 12 mm thick mixed density left cerebral convexity  subdural hemorrhage. No significant or progressive mass effect.  2. Remote left MCA territory infarct." CT on admission, "1. Bilateral peripheral segmental and subsegmental pulmonary emboli;  small clot burden. No  evidence of heart strain.  2. Bibasilar atelectasis. No definite pulmonary infarcts.  3. No acute abnormalities within the abdomen or pelvis.  4. 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  Recommend thyroid US (ref: J Am Coll Radiol. 2015 Feb;12(2):  143-50).  5. Aortic atherosclerosis."      SLP Plan  Continue with current plan of care      Recommendations for follow up therapy are one component of a multi-disciplinary discharge planning process, led by the attending physician.  Recommendations may be updated based on patient status, additional functional criteria and insurance authorization.    Recommendations  Diet recommendations: NPO Medication Administration: Via alternative means                General recommendations:  (Palliative Care consult for [place]; Dietician f/u) Oral Care Recommendations: Oral care QID;Staff/trained caregiver to provide oral care Follow Up Recommendations: Skilled nursing-short term rehab (<3 hours/day) Assistance recommended at discharge: Frequent or constant Supervision/Assistance SLP Visit Diagnosis: Dysphagia, oropharyngeal phase (R13.12);Cognitive communication deficit (R41.841) (unsure of pt's [place] cognitive status) Plan: Continue with current plan of care       [place], [place], CCC-SLP Speech Language Pathologist Rehab Services [phone] [facility] [place]  02/13/2021, 4:58 PM

## 2021-02-13 NOTE — Progress Notes (Signed)
[place] [facility] [place] NAME: Malik [name]    MR#:  [id number]  DATE OF BIRTH:  11/03/1945  SUBJECTIVE:  came in after patient was found at the side of his bed when he fell. Daughter Alleen Borne at bedside. Patient was found to have subdural hematoma and workup further showed bilateral PE, aspiration pneumonia and low hemoglobin. Patient is having some emotional outburst.  No family at bedside today. Patient not much communicative. Resting comfortably. Blood pressure has been fluctuating.  As NPO per speech therapy recommendation given high risk of aspiration. REVIEW OF SYSTEMS:   Review of Systems  Unable to perform ROS: Medical condition  Tolerating Diet: Tolerating PT:   DRUG ALLERGIES:  No Known Allergies  VITALS:  Blood pressure (!) 164/87, pulse 71, temperature 98.2 F (36.8 C), temperature source Oral, resp. rate 18, height 5\' 8"  (1.727 m), weight 81.6 kg, SpO2 100 %.  PHYSICAL EXAMINATION:   Physical Exam  GENERAL:  75 y.o.-year-old patient lying in the bed with no acute distress. Appears chronically ill HEENT: Head atraumatic, normocephalic. Oropharynx and nasopharynx clear.  LUNGS: decreased breath sounds bilaterally, no wheezing, rales, rhonchi. No use of accessory muscles of respiration.  CARDIOVASCULAR: S1, S2 normal. No murmurs, rubs, or gallops.  ABDOMEN: Soft, nontender, nondistended. Bowel sounds present. EXTREMITIES: No cyanosis, clubbing or edema b/l.    NEUROLOGIC: right sided hemiparesis, expressive aphasia.  PSYCHIATRIC:  patient is alert and intermittently has emotional outburst SKIN: No obvious rash, lesion, or ulcer.   LABORATORY PANEL:  CBC Recent Labs  Lab 02/13/21 0707  WBC 13.8*  HGB 8.4*  HCT 27.6*  PLT 483*     Chemistries  Recent Labs  Lab 02/11/21 1559 02/12/21 0416  NA 141 145  K 3.8 3.6  CL 112* 114*  CO2 24 24  GLUCOSE 132* 137*  BUN 20 15  CREATININE 1.21 0.91  CALCIUM 8.8* 8.7*   AST 23  --   ALT 27  --   ALKPHOS 97  --   BILITOT 0.6  --     Cardiac Enzymes No results for input(s): TROPONINI in the last 168 hours. RADIOLOGY:  CT HEAD WO CONTRAST (5MM)  Result Date: 02/12/2021 CLINICAL DATA:  Headache, intracranial hemorrhage suspected f/u initial for stability EXAM: CT HEAD WITHOUT CONTRAST TECHNIQUE: Contiguous axial images were obtained from the base of the skull through the vertex without intravenous contrast. COMPARISON:  02/11/2021. FINDINGS: Brain: No change in size of a 12 mm thick mixed density left cerebral convexity subdural hemorrhage with small areas of internal hyperdensity. Similar mild mass effect without midline shift. Similar remote left MCA territory infarct with encephalomalacia and gliosis. Additional scattered white matter hypodensities, nonspecific but compatible with chronic microvascular ischemic disease. No evidence of new/interval acute large vascular territory infarct. No mass lesion. Vascular: No hyperdense vessel identified. Calcific intracranial atherosclerosis. Skull: No acute fracture. Sinuses/Orbits: No acute finding. Other: No mastoid effusions. IMPRESSION: 1. No change in a 12 mm thick mixed density left cerebral convexity subdural hemorrhage. No significant or progressive mass effect. 2. Remote left MCA territory infarct. Electronically Signed   By: Margaretha Sheffield M.D.   On: 02/12/2021 04:17   CT HEAD WO CONTRAST (5MM)  Result Date: 02/11/2021 CLINICAL DATA:  Mental status change unknown cause, face pain. EXAM: CT HEAD WITHOUT CONTRAST CT CERVICAL SPINE WITHOUT CONTRAST TECHNIQUE: Multidetector CT imaging of the head and cervical spine was performed following the standard protocol without intravenous contrast. Multiplanar CT  image reconstructions of the cervical spine were also generated. COMPARISON:  October 31, 2020. FINDINGS: CT HEAD FINDINGS Brain: Increased size of a now mixed density near whole hemispheric left subdural collection,  measuring 12 mm in thickness with a few hyperdense areas within the collection for instance on image 25/2. This is Merry Proud with radiology on trying to reach whoever taken Caris sandal black wall vacuum the doctor please dex. Similar appearance of the large left hemispheric remote infarct and encephalomalacia. Stable age related global parenchymal volume loss with ex vacuo dilatation of ventricular system. Similar mild burden of chronic ischemic white matter disease. No hydrocephalus. Vascular: No hyperdense vessel. Atherosclerotic calcifications of the internal carotid arteries at the skull base. Skull: Negative for fracture or focal lesion. Sinuses/Orbits: Mild mucosal thickening in the bilateral maxillary sinuses and ethmoid air cells. Orbits are grossly unremarkable. Other: Mastoid air cells are predominantly clear. CT CERVICAL SPINE FINDINGS Alignment: Straightening of the normal cervical lordosis. No acute subluxation. Skull base and vertebrae: No acute fracture. Diffuse demineralization of bone. 1 cm sclerotic focus in the right first rib is nonspecific but favored to reflect a bone island. Soft tissues and spinal canal: No prevertebral fluid or swelling. No visible canal hematoma. Disc levels: Multilevel degenerative change with disc space narrowing and endplate irregularity. Upper chest: Biapical emphysematous change. Other: Partially visualized large multinodular goiter previously evaluated with thyroid ultrasound on October 29, 2019. IMPRESSION: 1. Increased size of a now mixed density near whole hemispheric left subdural collection measuring 12 mm in maximal thickness, favored to reflect a subdural hematoma which is technically age indeterminate but overall appears subacute, however there is a hyperdense area particularly along the posterior aspect which may reflect more acute blood products. No significant associated mass effect. 2. Similar appearance of the large left hemispheric remote infarct and  encephalomalacia. 3. No acute fracture or subluxation of the cervical spine. 4. Partially visualized large multinodular goiter previously evaluated with thyroid ultrasound on October 29, 2019. These results were called by telephone at the time of interpretation on 02/11/2021 at 8:42 pm to provider [facility] , who verbally acknowledged these results. Electronically Signed   By: Dahlia Bailiff M.D.   On: 02/11/2021 20:45   CT Angio Chest PE W and/or Wo Contrast  Addendum Date: 02/11/2021   ADDENDUM REPORT: 02/11/2021 23:16 ADDENDUM: The RV/LV ratio is calculated at 0.9. Electronically Signed   By: Fidela Salisbury M.D.   On: 02/11/2021 23:16   Result Date: 02/11/2021 CLINICAL DATA:  Suspected PE. Abdominal pain. EXAM: CT ANGIOGRAPHY CHEST CT ABDOMEN AND PELVIS WITH CONTRAST TECHNIQUE: Multidetector CT imaging of the chest was performed using the standard protocol during bolus administration of intravenous contrast. Multiplanar CT image reconstructions and MIPs were obtained to evaluate the vascular anatomy. Multidetector CT imaging of the abdomen and pelvis was performed using the standard protocol during bolus administration of intravenous contrast. CONTRAST:  179mL OMNIPAQUE IOHEXOL 350 MG/ML SOLN COMPARISON:  September 30, 2019 FINDINGS: CTA CHEST FINDINGS Cardiovascular: Somewhat suboptimal opacification of the segmental pulmonary arteries. Streak artifact from the right arm down. There are bilateral peripheral segmental and subsegmental pulmonary emboli; small clot burden. No evidence of heart strain. Mediastinum/Nodes: Large heterogeneous containing calcifications right thyroid mass extends into the anterior mediastinum and measures approximately 4.7 by 4.2 by 5.6 cm. No definite mediastinal lymphadenopathy. Trachea and esophagus are normal. Lungs/Pleura: Bibasilar atelectasis. No definite pulmonary infarcts. Musculoskeletal: No chest wall abnormality. No acute or significant osseous findings. Review of the MIP  images  confirms the above findings. CT ABDOMEN and PELVIS FINDINGS Hepatobiliary: No focal liver abnormality is seen. No gallstones, gallbladder wall thickening, or biliary dilatation. Pancreas: Unremarkable. No pancreatic ductal dilatation or surrounding inflammatory changes. Spleen: Surgically absent. Adrenals/Urinary Tract: Normal adrenal glands. Bilateral renal cysts. Normal urinary bladder. Stomach/Bowel: Stomach is within normal limits. Appendix appears normal. No evidence of bowel wall thickening, distention, or inflammatory changes. Vascular/Lymphatic: Aortic atherosclerosis. No enlarged abdominal or pelvic lymph nodes. Reproductive: Prostate is unremarkable. Other: No abdominal wall hernia or abnormality. No abdominopelvic ascites. Musculoskeletal: No acute or significant osseous findings. Review of the MIP images confirms the above findings. IMPRESSION: 1. Bilateral peripheral segmental and subsegmental pulmonary emboli; small clot burden. No evidence of heart strain. 2. Bibasilar atelectasis. No definite pulmonary infarcts. 3. No acute abnormalities within the abdomen or pelvis. 4. Large heterogeneous calcifications containing right thyroid mass extends into the anterior mediastinum measures up to 5.6 cm. Recommend thyroid US (ref: J Am Coll Radiol. 2015 Feb;12(2): 143-50). 5. Aortic atherosclerosis. Aortic Atherosclerosis (ICD10-I70.0). Electronically Signed: By: Fidela Salisbury M.D. On: 02/11/2021 20:32   CT Cervical Spine Wo Contrast  Result Date: 02/11/2021 CLINICAL DATA:  Mental status change unknown cause, face pain. EXAM: CT HEAD WITHOUT CONTRAST CT CERVICAL SPINE WITHOUT CONTRAST TECHNIQUE: Multidetector CT imaging of the head and cervical spine was performed following the standard protocol without intravenous contrast. Multiplanar CT image reconstructions of the cervical spine were also generated. COMPARISON:  October 31, 2020. FINDINGS: CT HEAD FINDINGS Brain: Increased size of a now  mixed density near whole hemispheric left subdural collection, measuring 12 mm in thickness with a few hyperdense areas within the collection for instance on image 25/2. This is Merry Proud with radiology on trying to reach whoever taken Caris sandal black wall vacuum the doctor please dex. Similar appearance of the large left hemispheric remote infarct and encephalomalacia. Stable age related global parenchymal volume loss with ex vacuo dilatation of ventricular system. Similar mild burden of chronic ischemic white matter disease. No hydrocephalus. Vascular: No hyperdense vessel. Atherosclerotic calcifications of the internal carotid arteries at the skull base. Skull: Negative for fracture or focal lesion. Sinuses/Orbits: Mild mucosal thickening in the bilateral maxillary sinuses and ethmoid air cells. Orbits are grossly unremarkable. Other: Mastoid air cells are predominantly clear. CT CERVICAL SPINE FINDINGS Alignment: Straightening of the normal cervical lordosis. No acute subluxation. Skull base and vertebrae: No acute fracture. Diffuse demineralization of bone. 1 cm sclerotic focus in the right first rib is nonspecific but favored to reflect a bone island. Soft tissues and spinal canal: No prevertebral fluid or swelling. No visible canal hematoma. Disc levels: Multilevel degenerative change with disc space narrowing and endplate irregularity. Upper chest: Biapical emphysematous change. Other: Partially visualized large multinodular goiter previously evaluated with thyroid ultrasound on October 29, 2019. IMPRESSION: 1. Increased size of a now mixed density near whole hemispheric left subdural collection measuring 12 mm in maximal thickness, favored to reflect a subdural hematoma which is technically age indeterminate but overall appears subacute, however there is a hyperdense area particularly along the posterior aspect which may reflect more acute blood products. No significant associated mass effect. 2. Similar  appearance of the large left hemispheric remote infarct and encephalomalacia. 3. No acute fracture or subluxation of the cervical spine. 4. Partially visualized large multinodular goiter previously evaluated with thyroid ultrasound on October 29, 2019. These results were called by telephone at the time of interpretation on 02/11/2021 at 8:42 pm to provider [facility] , who verbally acknowledged  these results. Electronically Signed   By: Dahlia Bailiff M.D.   On: 02/11/2021 20:45   CT ABDOMEN PELVIS W CONTRAST  Addendum Date: 02/11/2021   ADDENDUM REPORT: 02/11/2021 23:16 ADDENDUM: The RV/LV ratio is calculated at 0.9. Electronically Signed   By: Fidela Salisbury M.D.   On: 02/11/2021 23:16   Result Date: 02/11/2021 CLINICAL DATA:  Suspected PE. Abdominal pain. EXAM: CT ANGIOGRAPHY CHEST CT ABDOMEN AND PELVIS WITH CONTRAST TECHNIQUE: Multidetector CT imaging of the chest was performed using the standard protocol during bolus administration of intravenous contrast. Multiplanar CT image reconstructions and MIPs were obtained to evaluate the vascular anatomy. Multidetector CT imaging of the abdomen and pelvis was performed using the standard protocol during bolus administration of intravenous contrast. CONTRAST:  168mL OMNIPAQUE IOHEXOL 350 MG/ML SOLN COMPARISON:  September 30, 2019 FINDINGS: CTA CHEST FINDINGS Cardiovascular: Somewhat suboptimal opacification of the segmental pulmonary arteries. Streak artifact from the right arm down. There are bilateral peripheral segmental and subsegmental pulmonary emboli; small clot burden. No evidence of heart strain. Mediastinum/Nodes: Large heterogeneous containing calcifications right thyroid mass extends into the anterior mediastinum and measures approximately 4.7 by 4.2 by 5.6 cm. No definite mediastinal lymphadenopathy. Trachea and esophagus are normal. Lungs/Pleura: Bibasilar atelectasis. No definite pulmonary infarcts. Musculoskeletal: No chest wall abnormality. No acute  or significant osseous findings. Review of the MIP images confirms the above findings. CT ABDOMEN and PELVIS FINDINGS Hepatobiliary: No focal liver abnormality is seen. No gallstones, gallbladder wall thickening, or biliary dilatation. Pancreas: Unremarkable. No pancreatic ductal dilatation or surrounding inflammatory changes. Spleen: Surgically absent. Adrenals/Urinary Tract: Normal adrenal glands. Bilateral renal cysts. Normal urinary bladder. Stomach/Bowel: Stomach is within normal limits. Appendix appears normal. No evidence of bowel wall thickening, distention, or inflammatory changes. Vascular/Lymphatic: Aortic atherosclerosis. No enlarged abdominal or pelvic lymph nodes. Reproductive: Prostate is unremarkable. Other: No abdominal wall hernia or abnormality. No abdominopelvic ascites. Musculoskeletal: No acute or significant osseous findings. Review of the MIP images confirms the above findings. IMPRESSION: 1. Bilateral peripheral segmental and subsegmental pulmonary emboli; small clot burden. No evidence of heart strain. 2. Bibasilar atelectasis. No definite pulmonary infarcts. 3. No acute abnormalities within the abdomen or pelvis. 4. Large heterogeneous calcifications containing right thyroid mass extends into the anterior mediastinum measures up to 5.6 cm. Recommend thyroid US (ref: J Am Coll Radiol. 2015 Feb;12(2): 143-50). 5. Aortic atherosclerosis. Aortic Atherosclerosis (ICD10-I70.0). Electronically Signed: By: Fidela Salisbury M.D. On: 02/11/2021 20:32   US Venous Img Lower Bilateral  Result Date: 02/11/2021 CLINICAL DATA:  Known bilateral pulmonary emboli. EXAM: BILATERAL LOWER EXTREMITY VENOUS DOPPLER ULTRASOUND TECHNIQUE: Gray-scale sonography with graded compression, as well as color Doppler and duplex ultrasound were performed to evaluate the lower extremity deep venous systems from the level of the common femoral vein and including the common femoral, femoral, profunda femoral, popliteal  and calf veins including the posterior tibial, peroneal and gastrocnemius veins when visible. The superficial great saphenous vein was also interrogated. Spectral Doppler was utilized to evaluate flow at rest and with distal augmentation maneuvers in the common femoral, femoral and popliteal veins. COMPARISON:  None. FINDINGS: RIGHT LOWER EXTREMITY Common Femoral Vein: No evidence of thrombus. Normal compressibility, respiratory phasicity and response to augmentation. Saphenofemoral Junction: No evidence of thrombus. Normal compressibility and flow on color Doppler imaging. Profunda Femoral Vein: No evidence of thrombus. Normal compressibility and flow on color Doppler imaging. Femoral Vein: No evidence of thrombus. Normal compressibility, respiratory phasicity and response to augmentation. Popliteal Vein: No evidence of thrombus.  Normal compressibility, respiratory phasicity and response to augmentation. Calf Veins: No evidence of thrombus. Normal compressibility and flow on color Doppler imaging. Superficial Great Saphenous Vein: No evidence of thrombus. Normal compressibility. Venous Reflux:  None. Other Findings:  None. LEFT LOWER EXTREMITY Common Femoral Vein: No evidence of thrombus. Normal compressibility, respiratory phasicity and response to augmentation. Saphenofemoral Junction: No evidence of thrombus. Normal compressibility and flow on color Doppler imaging. Profunda Femoral Vein: No evidence of thrombus. Normal compressibility and flow on color Doppler imaging. Femoral Vein: No evidence of thrombus. Normal compressibility, respiratory phasicity and response to augmentation. Popliteal Vein: No evidence of thrombus. Normal compressibility, respiratory phasicity and response to augmentation. Calf Veins: No evidence of thrombus. Normal compressibility and flow on color Doppler imaging. Superficial Great Saphenous Vein: No evidence of thrombus. Normal compressibility. Venous Reflux:  None. Other Findings:   None. IMPRESSION: No evidence of deep venous thrombosis in either lower extremity. Electronically Signed   By: Inez Catalina M.D.   On: 02/11/2021 21:42   DG Chest Portable 1 View  Result Date: 02/11/2021 CLINICAL DATA:  Shortness of breath EXAM: PORTABLE CHEST 1 VIEW COMPARISON:  02/08/2021 FINDINGS: Shallow inspiration with low lung volumes. Chronic interstitial changes with background emphysema. Superimposed patchy opacities at the lung bases. Pulmonary vascular congestion. No pleural effusion. No pneumothorax. Similar cardiomediastinal contours with cardiomegaly. IMPRESSION: Patchy atelectasis/consolidation at the lung bases superimposed on chronic interstitial changes. Pulmonary vascular congestion and cardiomegaly. Electronically Signed   By: Macy Mis M.D.   On: 02/11/2021 20:05   ECHOCARDIOGRAM COMPLETE  Result Date: 02/12/2021    ECHOCARDIOGRAM REPORT   Patient Name:   Malik [name] Date of Exam: 02/12/2021 Medical Rec #:  [id number]         Height:       68.0 in Accession #:    [id number]        Weight:       180.0 lb Date of Birth:  [birth year]         BSA:          1.954 m Patient Age:    72 years          BP:           164/65 mmHg Patient Gender: M                 HR:           73 bpm. Exam Location:  [facility] Procedure: 2D Echo, Color Doppler and Cardiac Doppler Indications:     I26.09 Pulmonary Embolus  History:         Patient has prior history of Echocardiogram examinations. CHF                  and Dilated cardiomyopathy; Risk Factors:Diabetes, Hypertension                  and Sleep Apnea.  Sonographer:     Charmayne Sheer Referring Phys:  [id number] AMY N COX Diagnosing Phys: Kathlyn Sacramento MD  Sonographer Comments: Suboptimal apical window. IMPRESSIONS  1. Left ventricular ejection fraction, by estimation, is 60 to 65%. The left ventricle has normal function. The left ventricle has no regional wall motion abnormalities. There is moderate left ventricular hypertrophy. Left ventricular diastolic  parameters are consistent with Grade I diastolic dysfunction (impaired relaxation).  2. Right ventricular systolic function is normal. The right ventricular size is normal. Tricuspid regurgitation signal is inadequate for assessing PA pressure.  3. Left atrial size  was mildly dilated.  4. The mitral valve is normal in structure. No evidence of mitral valve regurgitation. No evidence of mitral stenosis.  5. The aortic valve is normal in structure. Aortic valve regurgitation is not visualized. Aortic valve sclerosis is present, with no evidence of aortic valve stenosis.  6. The inferior vena cava is dilated in size with >50% respiratory variability, suggesting right atrial pressure of 8 mmHg. FINDINGS  Left Ventricle: Left ventricular ejection fraction, by estimation, is 60 to 65%. The left ventricle has normal function. The left ventricle has no regional wall motion abnormalities. The left ventricular internal cavity size was normal in size. There is  moderate left ventricular hypertrophy. Left ventricular diastolic parameters are consistent with Grade I diastolic dysfunction (impaired relaxation). Right Ventricle: The right ventricular size is normal. No increase in right ventricular wall thickness. Right ventricular systolic function is normal. Tricuspid regurgitation signal is inadequate for assessing PA pressure. Left Atrium: Left atrial size was mildly dilated. Right Atrium: Right atrial size was normal in size. Pericardium: There is no evidence of pericardial effusion. Mitral Valve: The mitral valve is normal in structure. No evidence of mitral valve regurgitation. No evidence of mitral valve stenosis. MV peak gradient, 4.8 mmHg. The mean mitral valve gradient is 2.0 mmHg. Tricuspid Valve: The tricuspid valve is normal in structure. Tricuspid valve regurgitation is trivial. No evidence of tricuspid stenosis. Aortic Valve: The aortic valve is normal in structure. Aortic valve regurgitation is not visualized.  Aortic valve sclerosis is present, with no evidence of aortic valve stenosis. Aortic valve mean gradient measures 5.0 mmHg. Aortic valve peak gradient measures 9.7 mmHg. Aortic valve area, by VTI measures 2.34 cm. Pulmonic Valve: The pulmonic valve was normal in structure. Pulmonic valve regurgitation is mild. No evidence of pulmonic stenosis. Aorta: The aortic root is normal in size and structure. Venous: The inferior vena cava is dilated in size with greater than 50% respiratory variability, suggesting right atrial pressure of 8 mmHg. IAS/Shunts: No atrial level shunt detected by color flow Doppler.  LEFT VENTRICLE PLAX 2D LVIDd:         4.79 cm   Diastology LVIDs:         3.26 cm   LV e' medial:    5.00 cm/s LV PW:         1.28 cm   LV E/e' medial:  15.2 LV IVS:        1.00 cm   LV e' lateral:   10.60 cm/s LVOT diam:     1.90 cm   LV E/e' lateral: 7.2 LV SV:         66 LV SV Index:   34 LVOT Area:     2.84 cm  RIGHT VENTRICLE RV Basal diam:  2.44 cm RV S prime:     16.00 cm/s LEFT ATRIUM             Index        RIGHT ATRIUM           Index LA diam:        5.20 cm 2.66 cm/m   RA Area:     12.80 cm LA Vol (A2C):   57.7 ml 29.53 ml/m  RA Volume:   29.30 ml  14.99 ml/m LA Vol (A4C):   83.8 ml 42.88 ml/m LA Biplane Vol: 69.6 ml 35.61 ml/m  AORTIC VALVE  PULMONIC VALVE AV Area (Vmax):    1.98 cm      PV Vmax:       1.49 m/s AV Area (Vmean):   2.01 cm      PV Vmean:      92.900 cm/s AV Area (VTI):     2.34 cm      PV VTI:        0.257 m AV Vmax:           156.00 cm/s   PV Peak grad:  8.9 mmHg AV Vmean:          104.000 cm/s  PV Mean grad:  4.0 mmHg AV VTI:            0.283 m AV Peak Grad:      9.7 mmHg AV Mean Grad:      5.0 mmHg LVOT Vmax:         109.00 cm/s LVOT Vmean:        73.800 cm/s LVOT VTI:          0.234 m LVOT/AV VTI ratio: 0.83  AORTA Ao Root diam: 3.60 cm MITRAL VALVE MV Area (PHT): 2.92 cm    SHUNTS MV Area VTI:   2.27 cm    Systemic VTI:  0.23 m MV Peak grad:  4.8 mmHg     Systemic Diam: 1.90 cm MV Mean grad:  2.0 mmHg MV Vmax:       1.10 m/s MV Vmean:      63.5 cm/s MV Decel Time: 260 msec MV E velocity: 75.80 cm/s MV A velocity: 96.40 cm/s MV E/A ratio:  0.79 Kathlyn Sacramento MD Electronically signed by Kathlyn Sacramento MD Signature Date/Time: 02/12/2021/12:21:11 PM    Final    US THYROID  Result Date: 02/12/2021 CLINICAL DATA:  Dysphagia. EXAM: THYROID ULTRASOUND TECHNIQUE: Ultrasound examination of the thyroid gland and adjacent soft tissues was performed. COMPARISON:  10/29/2019 FINDINGS: Parenchymal Echotexture: Mildly heterogenous Isthmus: 1.1 cm, previously 1.0 cm Right lobe: 8.6 x 5.4 x 2.9 cm, previously 9.0 x 5.0 x 4.0 cm Left lobe: 5.6 x 2.5 x 2.4 cm, previously 5.2 x 2.4 x 2.3 cm _________________________________________________________ Estimated total number of nodules >/= 1 cm: 5 Number of spongiform nodules >/=  2 cm not described below (TR1): 0 Number of mixed cystic and solid nodules >/= 1.5 cm not described below ([place]): 0 _________________________________________________________ Nodule # 1: Location: Isthmus; Mid; right Maximum size: 3.9 cm; Other 2 dimensions: 2.0 x 2.8 cm Composition: solid/almost completely solid (2) Echogenicity: isoechoic (1) Shape: not taller-than-wide (0) Margins: ill-defined (0) Echogenic foci: none (0) ACR TI-RADS total points: 3. ACR TI-RADS risk category: TR3 (3 points). ACR TI-RADS recommendations: **Given size (>/= 2.5 cm) and appearance, fine needle aspiration of this mildly suspicious nodule should be considered based on TI-RADS criteria. This area was not measured as a discrete nodule on the previous examination but the overall morphology and size is similar to the exam in 2021. _________________________________________________________ Nodule # 2: Prior biopsy: No Location: Right; Superior Maximum size: 2.8 cm; Other 2 dimensions: 1.8 x 2.4 cm, previously, 2.4 x 1.6 x 2.0 cm Composition: mixed cystic and solid (1) Echogenicity:  isoechoic (1) Shape: not taller-than-wide (0) Margins: smooth (0) Echogenic foci: none (0) ACR TI-RADS total points: 2. ACR TI-RADS risk category:  TR2 (2 points). Significant change in size (>/= 20% in two dimensions and minimal increase of 2 mm): No Change in features: No Change in ACR TI-RADS risk category: No ACR TI-RADS  recommendations: This nodule does NOT meet TI-RADS criteria for biopsy or dedicated follow-up. _________________________________________________________ Nodule # 3: Prior biopsy: No Location: Right; Mid Maximum size: 1.8 cm; Other 2 dimensions: 1.6 x 1.0 cm, previously, 1.9 x 1.8 x 1.4 cm Composition: solid/almost completely solid (2) Echogenicity: hypoechoic (2) Shape: taller-than-wide (3) Margins: ill-defined (0) Echogenic foci: none (0) ACR TI-RADS total points: 7. ACR TI-RADS risk category:  TR5 (>/= 7 points). Significant change in size (>/= 20% in two dimensions and minimal increase of 2 mm): No Change in features: No Change in ACR TI-RADS risk category: No ACR TI-RADS recommendations: **Given size (>/= 1.0 cm) and appearance, fine needle aspiration of this highly suspicious nodule should be considered based on TI-RADS criteria. _________________________________________________________ Nodule # 4: Prior biopsy: No Location: Right; Inferior Maximum size: 6.1 cm; Other 2 dimensions: 3.5 x 5.2 cm, previously, 5.9 x 4.8 x 4.0 cm Composition: solid/almost completely solid (2) Echogenicity: isoechoic (1) Shape: not taller-than-wide (0) Margins: ill-defined (0) Echogenic foci: none (0) ACR TI-RADS total points: 3. ACR TI-RADS risk category:  TR3 (3 points). Significant change in size (>/= 20% in two dimensions and minimal increase of 2 mm): No Change in features: No Change in ACR TI-RADS risk category: No ACR TI-RADS recommendations: **Given size (>/= 2.5 cm) and appearance, fine needle aspiration of this mildly suspicious nodule should be considered based on TI-RADS criteria.  _________________________________________________________ Nodule # 5: Prior biopsy: No Location: Left; Mid Maximum size: 1.7 cm; Other 2 dimensions: 1.2 x 1.2 cm, previously, 2.0 x 1.2 x 1.4 cm Composition: solid/almost completely solid (2) Echogenicity: isoechoic (1) Shape: not taller-than-wide (0) Margins: ill-defined (0) Echogenic foci: none (0) ACR TI-RADS total points: 3. ACR TI-RADS risk category:  TR3 (3 points). Significant change in size (>/= 20% in two dimensions and minimal increase of 2 mm): No Change in features: No Change in ACR TI-RADS risk category: No ACR TI-RADS recommendations: *Given size (>/= 1.5 - 2.4 cm) and appearance, a follow-up ultrasound in 1 year should be considered based on TI-RADS criteria. _________________________________________________________ IMPRESSION: 1. Multinodular goiter. Dominant nodules have not significantly changed since 10/29/2019. 2. Nodules 1, 3 and 4 all meet criteria for ultrasound-guided biopsy. Not clear if any of these nodules have been biopsied in the past. TI-RADS recommends biopsy of the 2 most suspicious nodules at this time which are nodules 3 and 4. Recommend 1 year follow-up of the other nodules. The above is in keeping with the ACR TI-RADS recommendations - J Am Coll Radiol 2017;14:587-595. Electronically Signed   By: Markus Daft M.D.   On: 02/12/2021 15:44   ASSESSMENT AND PLAN:  Malik [name] is a 75 y.o. male with medical history significant for CVA status post thrombectomy approximately 2 years ago, dementia, non-insulin-dependent diabetes mellitus, hypertension, GERD, who presents emergency department from home for chief concerns of shortness of breath and worsening  mental status.  Acute metabolic encephalopathy secondary to all hematoma with history of fall at home. -- Neurosurgery consultation with Dr. Dixie Dials and Dr. Lacinda Axon -- repeat CT head no acute change -- Dr. Lacinda Axon recommends repeat CT head on 02/14/2021  (ordered) to determine if  patient is appropriate for starting heparin drip for treatment of PE -- patient has history of stroke about two years ago. 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 consultation if needed -- Per daughter patient was not on any blood thinners at home  acute respiratory distress secondary to aspiration pneumonia -- x-ray shows patchy infiltrate by basilar lobes -- patient on IV antibiotics Unasyn -- seen by speech therapy recommends NPO. Patient has overt sign symptoms of aspiration -- will need to consider other modes of feeding --12/6--d/w dter regarding NG feeding and then PEG feeding if not improvement. She will d/w her brother.  --palliative care consulted  late effects of CVA,status post thrombectomy approximately 2 years ago per family -- patient has emotional outbursts with right sided hemiparesis. He does have garbled speech  non insulin dependent diabetes -- continue sliding scale insulin since patient is NPO  Hypertension -- holding PO meds -- IV PRN hydralazine --started clonidine patch  enlarged thyroid/multi-nodule goiter as noted on ultrasound thyroid -- patient has had ultrasound in 2021 in comparison appears somewhat same. There few nodules that are suspicious which will require biopsy at a later date. The results were reviewed with patient's daughter on the phone. At present not a candidate for biopsy given other medical issues.   Discussed  overall poor long-term prognosis given multiple chronic comorbidities and current medical condition.  Palliative care to see patient.  Procedures: Family communication : daughter Alleen Borne at bedside Consults : neurosurgery CODE STATUS: full DVT Prophylaxis : SCD Level of care: Stepdown Status is: Inpatient  Remains inpatient appropriate because: PE, subdural hematoma, anemia, aspiration pneumonia  patient has multiple comorbidities. He is quite sick. Palliative care consultation placed TOC for discharge planning PT OT to see patient     TOTAL TIME TAKING CARE OF THIS PATIENT: 35 minutes.  >50% time spent on counselling and coordination of care  Note: This dictation was prepared with Dragon dictation along with smaller phrase technology. Any transcriptional errors that result from this process are unintentional.  Fritzi Mandes M.D    [facility]   CC: Primary care physician; Donnie Coffin, MD Patient ID: Malik [name], male   DOB: March 13, 1945, 75 y.o.   MRN: [id number]

## 2021-02-13 NOTE — Progress Notes (Signed)
Pts daughter Alois Cliche [phone] would like a call from palliative if she is not here when they assess patient.

## 2021-02-14 ENCOUNTER — Ambulatory Visit: Payer: Medicare Other | Admitting: Family

## 2021-02-14 ENCOUNTER — Inpatient Hospital Stay: Payer: Medicare Other

## 2021-02-14 DIAGNOSIS — J690 Pneumonitis due to inhalation of food and vomit: Secondary | ICD-10-CM

## 2021-02-14 DIAGNOSIS — E079 Disorder of thyroid, unspecified: Secondary | ICD-10-CM

## 2021-02-14 DIAGNOSIS — G9341 Metabolic encephalopathy: Secondary | ICD-10-CM

## 2021-02-14 DIAGNOSIS — J9601 Acute respiratory failure with hypoxia: Secondary | ICD-10-CM

## 2021-02-14 DIAGNOSIS — E43 Unspecified severe protein-calorie malnutrition: Secondary | ICD-10-CM

## 2021-02-14 DIAGNOSIS — R1310 Dysphagia, unspecified: Secondary | ICD-10-CM

## 2021-02-14 DIAGNOSIS — F1010 Alcohol abuse, uncomplicated: Secondary | ICD-10-CM

## 2021-02-14 DIAGNOSIS — Z515 Encounter for palliative care: Secondary | ICD-10-CM

## 2021-02-14 DIAGNOSIS — Z789 Other specified health status: Secondary | ICD-10-CM

## 2021-02-14 DIAGNOSIS — D649 Anemia, unspecified: Secondary | ICD-10-CM

## 2021-02-14 DIAGNOSIS — I2694 Multiple subsegmental pulmonary emboli without acute cor pulmonale: Secondary | ICD-10-CM

## 2021-02-14 DIAGNOSIS — I6200 Nontraumatic subdural hemorrhage, unspecified: Secondary | ICD-10-CM

## 2021-02-14 LAB — GLUCOSE, CAPILLARY
Glucose-Capillary: 89 mg/dL (ref 70–99)
Glucose-Capillary: 99 mg/dL (ref 70–99)
Glucose-Capillary: 95 mg/dL (ref 70–99)
Glucose-Capillary: 133 mg/dL — ABNORMAL HIGH (ref 70–99)
Glucose-Capillary: 124 mg/dL — ABNORMAL HIGH (ref 70–99)
Glucose-Capillary: 220 mg/dL — ABNORMAL HIGH (ref 70–99)

## 2021-02-14 NOTE — Consult Note (Addendum)
Consultation Note Date: 02/14/2021   Patient Name: Malik [name]  DOB: 1945-07-01  MRN: [id number]  Age / Sex: 75 y.o., male  PCP: Donnie Coffin, MD Referring Physician: Loletha Grayer, MD  Reason for Consultation: Establishing goals of care  HPI/Patient Profile: 75 y.o. male  with past medical history of acute metabolic encephalopathy related to hematoma status post fall at home, bilateral pulmonary embolisms, acute on chronic anemia, duodenal angiodysplasia, acute respiratory distress due to aspiration pneumonia, type 2 diabetes, HTN, and enlarged thyroid with multi nodular goiter admitted on 02/11/2021 after being found at the side of his bed on his knees by his daughter.  Palliative medicine was consulted to establish goals of care.   Clinical Assessment and Goals of Care: I have reviewed medical records including EPIC notes, labs and imaging, assessed the patient and spoke with his [facility] POA/son [name], [name] over the phone.  I then met with the patient's daughter [name] bedside to discuss diagnosis prognosis, [place], EOL wishes, disposition and options.  I introduced Palliative Medicine as specialized medical care for people living with serious illness. It focuses on providing relief from the symptoms and stress of a serious illness. The goal is to improve quality of life for both the patient and the family.  We discussed a brief life review of the patient.  Patient is father of 6 children.  He is divorced.  He worked as a Dealer.  Daughter endorses patient was a heavy drinker until he had a stroke a few years ago.  As far as functional and nutritional status patient was living home independently without difficulty.  He was able to perform all ADLs independently and without assistance.  Daughter shares she and her family would cook for him and always come by and check on him.  She shares somebody  was with him almost all the time.   We discussed patient's current illness and what it means in the larger context of patient's on-going co-morbidities.  Natural disease trajectory and expectations at EOL were discussed.I attempted to elicit values and goals of care important to the patient.  Patient's son shares that he wants to do what ever his dad needs.  He understands his father's advanced age and complicated comorbidities.  He appreciates that no one lives forever but wants to do everything he can to make sure his dad "does well".  Patient's daughter shares that she knows her father is likely not to recover from these illnesses.  She is a retired Chief Executive Officer and has a great understanding of hospice.  She shares that her family looks to her for medical decision making.  She acknowledges that many of his issues are nonreversible.  She is strongly feeling as if the patient should be a DNR and should go home with hospice.  However, she shared she will speak with her family and update them.  The difference between aggressive medical intervention and comfort care was considered in light of the patient's goals of care.   Advance directives, concepts specific  to code status, artificial feeding and hydration, and rehospitalization were considered and discussed.  Education offered regarding concept specific to human mortality and the limitations of medical interventions to prolong life when the body has some many complex and confounding comorbidities such as a subdural hematoma as well as bilateral pleural effusions.   Discussed with patient/family the importance of continued conversation with family and the medical providers regarding overall plan of care and treatment options, ensuring decisions are within the context of the patient's values and GOCs.    Inpatient hospice facility and hospice at home introduced and discussed in detail.  Questions and concerns were addressed. The family was  encouraged to call with questions or concerns.   Primary Decision Maker HCPOA  Code Status/Advance Care Planning: Full code  Prognosis:   Unable to determine  Discharge Planning: To Be Determined  Primary Diagnoses: Present on Admission:  Subdural hematoma  TBI (traumatic brain injury)  Severe protein-calorie malnutrition (HCC)  Leukocytosis  Obesity  Hyperlipidemia LDL goal <70  Hypertension  Essential hypertension  Benign essential HTN  Alcohol abuse  Anemia due to chronic kidney disease  Pulmonary embolism ([place])   Physical Exam Constitutional:      Appearance: Normal appearance.  HENT:     Head: Normocephalic and atraumatic.     Mouth/Throat:     Mouth: Mucous membranes are dry.  Cardiovascular:     Rate and Rhythm: Normal rate.     Pulses: Normal pulses.  Pulmonary:     Effort: Pulmonary effort is normal.  Abdominal:     Palpations: Abdomen is soft.  Musculoskeletal:     Comments: Generalized weakness  Skin:    General: Skin is warm and dry.  Neurological:     Mental Status: He is alert.     Comments: Oriented to self  Psychiatric:        Mood and Affect: Mood normal.        Behavior: Behavior normal.    Vital Signs: BP (!) 170/69 (BP Location: Right Arm)   Pulse 70   Temp 98.2 F (36.8 C) (Oral)   Resp 20   Ht '5\' 8"'  (1.727 m)   Wt 81.6 kg   SpO2 100%   BMI 27.37 kg/m  Pain Scale: 0-10   Pain Score: 0-No pain SpO2: SpO2: 100 % O2 Device:SpO2: 100 % O2 Flow Rate: .O2 Flow Rate (L/min): 2 L/min  Palliative Assessment/Data: 30%     I discussed this patient's plan of care with patient, patient's daughter, patient's son/HCPOA.  Thank you for this consult. Palliative medicine will continue to follow and assist holistically.   Time Total: 70 minutes Greater than 50%  of this time was spent counseling and coordinating care related to the above assessment and plan.  Signed by: [name], DNP, FNP-BC Palliative Medicine    Please  contact Palliative Medicine Team phone at [phone] for questions and concerns.  For individual provider: See Shea Evans

## 2021-02-14 NOTE — Progress Notes (Signed)
Patient ID: Malik [name], male   DOB: 1945-07-03, 75 y.o.   MRN: [id number] [facility] PROGRESS NOTE  Aloysious Vangieson ALP:[id number] DOB: 1945-05-26 DOA: 02/11/2021 PCP: Donnie Coffin, MD  HPI/Subjective: Patient able to talk a little bit today.  Difficult to understand.  Able to lift his left leg up off the bed and good grip strength with left hand.  Chronic right-sided weakness.  Admitted after a fall and found to have a subdural hematoma  Objective: Vitals:   02/14/21 0700 02/14/21 0800  BP: (!) 163/66 (!) 172/71  Pulse: 71 66  Resp: 16 17  Temp:  97.8 F (36.6 C)  SpO2: 100% 100%    Intake/Output Summary (Last 24 hours) at 02/14/2021 1207 Last data filed at 02/14/2021 0800 Gross per 24 hour  Intake 850.03 ml  Output 1850 ml  Net -999.97 ml   Filed Weights   02/11/21 1543  Weight: 81.6 kg    ROS: Review of Systems  Respiratory:  Positive for cough and shortness of breath.   Cardiovascular:  Negative for chest pain.  Gastrointestinal:  Negative for abdominal pain.  Exam: Physical Exam HENT:     Head: Normocephalic.     Mouth/Throat:     Pharynx: No oropharyngeal exudate.  Eyes:     General: Lids are normal.     Conjunctiva/sclera: Conjunctivae normal.  Cardiovascular:     Rate and Rhythm: Normal rate and regular rhythm.     Heart sounds: Normal heart sounds, S1 normal and S2 normal.  Pulmonary:     Breath sounds: Examination of the right-lower field reveals decreased breath sounds. Examination of the left-lower field reveals decreased breath sounds. Decreased breath sounds present. No wheezing, rhonchi or rales.  Abdominal:     Palpations: Abdomen is soft.     Tenderness: There is no abdominal tenderness.  Musculoskeletal:     Right lower leg: Swelling present.     Left lower leg: Swelling present.  Skin:    General: Skin is warm.     Findings: No rash.  Neurological:     Mental Status: He is alert.     Comments: Able to answer few questions  but difficult to understand.  Able to lift left leg off the bed.  More difficulty with right leg.      Scheduled Meds:  Chlorhexidine Gluconate Cloth  6 each Topical Daily   cloNIDine  0.2 mg Transdermal Weekly   folic acid  3,532 mcg Oral Daily   insulin aspart  0-9 Units Subcutaneous Q4H   latanoprost  1 drop Both Eyes QHS   pantoprazole (PROTONIX) IV  40 mg Intravenous Q12H   Continuous Infusions:  ampicillin-sulbactam (UNASYN) IV 3 g (02/14/21 1130)   azithromycin Stopped (02/13/21 2344)   levETIRAcetam 500 mg (02/14/21 0902)    Assessment/Plan:  Subdural hematoma.  Repeat CT scan does show slightly smaller area of subdural hematoma.  Neurosurgery currently following.  Empirically on Keppra Bilateral segmental pulmonary embolism.  Not a great candidate for anticoagulation with falls and subdural hematoma.  Currently not in any respiratory distress.  Lower extremity sonogram negative for DVT. Acute metabolic encephalopathy likely secondary to subdural hematoma.  Failed swallow evaluation yesterday. Acute on chronic anemia with history of GI bleed with duodenal angiectasia's back in 2021.  Received 1 unit of packed red blood cells.  On Protonix. Aspiration pneumonia on Unasyn and Zithromax.  As of yesterday patient did not do well with swallow evaluation Thyroid mass.  TSH normal range.  Would technically need a biopsy at some point time. Glaucoma latanoprost Essential hypertension on clonidine patch Severe protein calorie malnutrition Palliative care consultation.  Patient will need to be able to swallow in order to survive.     Code Status:     Code Status Orders  (From admission, onward)           Start     Ordered   02/11/21 2226  Full code  Continuous        02/11/21 2226           Code Status History     Date Active Date Inactive Code Status Order ID Comments User Context   03/16/2020 1405 03/30/2020 2228 Full Code [id number]  Gwynne Edinger, MD ED    09/28/2019 2328 10/07/2019 1920 Full Code [id number]  Orene Desanctis, DO ED   08/10/2019 1930 08/24/2019 2103 Full Code [id number]  Rosalin Hawking, MD ED   05/27/2017 1956 06/07/2017 1427 Full Code [id number]  Bary Leriche, PA-C Inpatient   05/27/2017 1956 05/27/2017 1956 Full Code [id number]  Bary Leriche, PA-C Inpatient   05/10/2017 1735 05/27/2017 1754 Full Code [id number]  Clovis Riley, MD Inpatient   07/01/2015 0525 07/01/2015 1901 Full Code [id number]  Harrie Foreman, MD Inpatient      Advance Directive Documentation    Flowsheet Row Most Recent Value  Type of Advance Directive Healthcare Power of Attorney  Pre-existing out of facility DNR order (yellow form or pink MOST form) --  "MOST" Form in Place? --      Family Communication: Spoke with daughter on the phone Disposition Plan: Status is: Inpatient  Consultants: Neurosurgery Palliative care  Antibiotics: Unasyn and Zithromax  Time spent: 27 minutes  [place]

## 2021-02-14 NOTE — Progress Notes (Signed)
    Attending Progress Note  History: Malik [name] is here for new AMS and SOB. He was found to have a subacute on chronic SDH for which neurosurgery was consulted.   Physical Exam: Vitals:   02/14/21 1200 02/14/21 1445  BP: (!) 160/76 (!) 170/69  Pulse: 63 70  Resp: 17 20  Temp: 98 F (36.7 C) 98.2 F (36.8 C)  SpO2:  100%    Alert. Able to appropriately answer yes/ no questions. Profoundly aphasic.  Facial tone symmetric.EOMI  Strength: good and equal strength throughout BLE. 0/5 throughout RUE 5/5 in LUE.   Data:  Recent Labs  Lab 02/09/21 1816 02/11/21 1559 02/12/21 0416  NA 139 141 145  K 3.9 3.8 3.6  CL 109 112* 114*  CO2 21* 24 24  BUN 20 20 15   CREATININE 1.13 1.21 0.91  GLUCOSE 137* 132* 137*  CALCIUM 9.3 8.8* 8.7*   Recent Labs  Lab 02/11/21 1559  AST 23  ALT 27  ALKPHOS 97     Recent Labs  Lab 02/11/21 1559 02/12/21 0416 02/13/21 0707  WBC 17.8* 14.7* 13.8*  HGB 7.3* 6.7* 8.4*  HCT 25.1* 23.0* 27.6*  PLT 530* 495* 483*   Recent Labs  Lab 02/11/21 1559 02/12/21 0035  APTT  --  33  INR 1.0  --          Other tests/results:  CT head 02/14/21 IMPRESSION: No new or worsening finding. Old infarction in the left middle cerebral artery territory. Slight reduction in size of a mixed density subdural hematoma on the left, maximal thickness 1 cm today compared with 12 mm previously.     Electronically Signed   By: Nelson Chimes M.D.   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 His exam does not fit with the SDH and he needs neurology evaluation to rule out partial seizure activity or TIA/recurrence of stroke symptoms. Given stability on scan, no surgery is needed at this time. He is OK for DVT prophylaxis at this time but for anticoagulation for treatment of PE, recommend 48 hours to initiate after stable scan. Would start with low PTT goal (60-80) on heparin infusion  and follow up scan 48 hours after starting that to again ensure stability.  - repeat HCT was stable. Ok to start Heparin with ptt goal of 60-80, No loading dose  -continue Keppra 500mg  BID x 7 days for seizure ppx - recommend neurology for further evaluation of possible seizures or stroke etiology.  - repeat HCT on 02/16/21 (48 hours after starting anticoagulation).  Cooper Render PA-C Department of Neurosurgery

## 2021-02-14 NOTE — Progress Notes (Signed)
Report called to RN on 120..  All questions answered and POC continued. Family at bedside and updates given..  All belongings sent with family.

## 2021-02-14 NOTE — Progress Notes (Signed)
[facility]) [facility] Liaison note:  This patient is currently enrolled in [facility] outpatient-based Palliative Care. Will continue to follow for disposition.  Please call with any outpatient palliative questions or concerns.  Thank you, Lorelee Market, LPN [facility] Liaison [phone]

## 2021-02-14 NOTE — Progress Notes (Signed)
Patient transferred to 1C120 via bed.  Family at bedside during transport.Malik [name]

## 2021-02-14 NOTE — Progress Notes (Signed)
Speech Language Pathology Treatment: Dysphagia  Patient Details Name: Malik [name] MRN: [id number] DOB: 12-13-1945 Today's Date: 02/14/2021 Time: 8315-1761 SLP Time Calculation (min) (ACUTE ONLY): 55 min  Assessment / Plan / Recommendation Clinical Impression  Pt seen for ongoing assessment of swallowing in hopes to upgrade to an oral diet. Pt more awake today and actually was more verbal during session and to Dtr per her report. He responded adequately to verbal/tactile cues for participation in po trials. Limited verbalizations; (?)oral apraxia was noted at eval/tx session, but overall improved presentation today during Oral Care and po trials. Unsure of pt's full, baseline Cognitive - communication status as pt has h/o aphasia. On [place] O2 2L.      Oral care provided by this Clinician -- less secretions noted. Pt given trials of ice chips (x5), nectar-thick liquids (x10+ tsp for bolus size control), and applesauce (x10 tsp) w/ improved oral phase control and timely A-P transfer; pharyngeal was timely in response to transfer of boluses. Oropharyngeal phases appeared more organized w/ bolus management and swallowing. Pt demonstrated coughing x1 w/ 2 ice chips given at one time (increased thin liquid bolus). No other overt clinical s/s of aspiration noted during trials of nectar liquids, purees -- O2 sats remained 99%, RR ~20. No multiple swallows nor wet vocal quality appreciated during mumbled speech post trials. A much improved presentation w/ pt asking verbally for "more" applesauce and "something to drink".    Pt appears at risk for aspiration and pulmonary impact d/t oropharyngeal phase dysphagia and Acute SDH, hx of stroke, multiple medical comorbidites, overall deconditioned state (bedbound at baseline), reduced respiratory status. Suspect risk can be reduced somewhat by using a modified dysphagia diet w/ Nectar liquids and strict aspiration precautions w/ Supervised feeding of po's. Recommend  trial of Dysphagia diet; discussion w/ Dtr and Palliative Care re: POC. Pt and Dtr agreed.   ST services will continue to f/u w/ ongoing assessment of oropharyngeal swallowing; education on dysphagia and aspiration. Recommend frequent oral care for hygiene; aspiration precautions. NSG and MD updated.       HPI HPI: Per 20 H&P "Malik [name] is a 75 y.o. male with medical history significant for CVA status post thrombectomy approximately 2 years ago, dementia, non-insulin-dependent diabetes mellitus, hypertension, GERD, who presents emergency department from home for chief concerns of shortness of breath and worsening altered mental status.     Per son, Malik [name] who is the legal healthcare power of attorney, states that patient fell off the bed on Tuesday.  When family found him, he was on his knees.  Since Tuesday, 02/06/2021, he has been mostly bedbound.     At bedside patient speech is garbled and difficult to understand.  Patient is able to follow commands and squeeze my fingers using his left hand, he is able to move his bilateral lower extremities without any difficulty.  He opens his mouth at my request.  He squeezes his eye tightly shut when I utilize a flashlight pointing it out his eyes.      Of note his right upper extremity, has fingers that appear to be contracted and he is not able to follow my commands by squeezing my hands utilizing his right upper extremities.  This appears to be chronic." Most recent Head CT, 02/12/21, "1. No change in a 12 mm thick mixed density left cerebral convexity  subdural hemorrhage. No significant or progressive mass effect.  2. Remote left MCA territory infarct." CT on admission, "1. Bilateral  peripheral segmental and subsegmental pulmonary emboli;  small clot burden. No evidence of heart strain.  2. Bibasilar atelectasis. No definite pulmonary infarcts.  3. No acute abnormalities within the abdomen or pelvis.  4. 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  Recommend thyroid US (ref: J Am Coll Radiol. 2015 Feb;12(2):  143-50).  5. Aortic atherosclerosis."      SLP Plan  Continue with current plan of care      Recommendations for follow up therapy are one component of a multi-disciplinary discharge planning process, led by the attending physician.  Recommendations may be updated based on patient status, additional functional criteria and insurance authorization.    Recommendations  Diet recommendations: Dysphagia 1 (puree);Nectar-thick liquid Liquids provided via: Teaspoon Medication Administration: Crushed with puree Compensations: Minimize environmental distractions;Slow rate;Small sips/bites;Lingual sweep for clearance of pocketing;Multiple dry swallows after each bite/sip;Follow solids with liquid Postural Changes and/or Swallow Maneuvers: Out of bed for meals;Seated upright 90 degrees;Upright 30-60 min after meal                General recommendations:  (Dietician f/u; Palliative Care following) Oral Care Recommendations: Oral care BID;Oral care before and after PO;Staff/trained caregiver to provide oral care Follow Up Recommendations: Skilled nursing-short term rehab (<3 hours/day) Assistance recommended at discharge: Frequent or constant Supervision/Assistance SLP Visit Diagnosis: Dysphagia, oropharyngeal phase (R13.12) Plan: Continue with current plan of care       GO                 Malik [name], [place], [place] Pathologist Re[facility] Services [phone] [facility]  02/14/2021, 3:45 PM

## 2021-02-15 ENCOUNTER — Ambulatory Visit: Payer: Medicare Other | Admitting: Family

## 2021-02-15 DIAGNOSIS — R4182 Altered mental status, unspecified: Secondary | ICD-10-CM

## 2021-02-15 LAB — GLUCOSE, CAPILLARY
Glucose-Capillary: 105 mg/dL — ABNORMAL HIGH (ref 70–99)
Glucose-Capillary: 107 mg/dL — ABNORMAL HIGH (ref 70–99)
Glucose-Capillary: 110 mg/dL — ABNORMAL HIGH (ref 70–99)
Glucose-Capillary: 140 mg/dL — ABNORMAL HIGH (ref 70–99)
Glucose-Capillary: 147 mg/dL — ABNORMAL HIGH (ref 70–99)
Glucose-Capillary: 181 mg/dL — ABNORMAL HIGH (ref 70–99)

## 2021-02-15 MED ORDER — ADULT MULTIVITAMIN W/MINERALS CH
1.0000 | ORAL_TABLET | Freq: Every day | ORAL | Status: DC
  Administered 2021-02-15 – 2021-02-16 (×2): 1 via ORAL
  Filled 2021-02-15 (×2): qty 1

## 2021-02-15 MED ORDER — NEPRO/CARBSTEADY PO LIQD
237.0000 mL | Freq: Two times a day (BID) | ORAL | Status: DC
  Administered 2021-02-15 – 2021-02-16 (×3): 237 mL via ORAL

## 2021-02-15 NOTE — Progress Notes (Signed)
Patient ID: Malik [name], male   DOB: [birth year], 75 y.o.   MRN: [id number] [facility] PROGRESS NOTE  Yuki Purves [id number]:[id number] DOB: 1945-06-02 DOA: 02/11/2021 PCP: Donnie Coffin, MD  HPI/Subjective: Patient answers some simple questions.  Able to follow some simple commands.  Placed on dysphagia diet yesterday.  Admitted after a fall and found to have a subdural hematoma and also pulmonary emboli.  Objective: Vitals:   02/15/21 0725 02/15/21 1129  BP: 134/66 (!) 141/69  Pulse: 63 72  Resp: 17 18  Temp: 97.8 F (36.6 C) 98.4 F (36.9 C)  SpO2: 97% 94%    Intake/Output Summary (Last 24 hours) at 02/15/2021 1409 Last data filed at 02/15/2021 1403 Gross per 24 hour  Intake 790 ml  Output 400 ml  Net 390 ml   Filed Weights   02/11/21 1543  Weight: 81.6 kg    ROS: Review of Systems  Respiratory:  Negative for shortness of breath.   Cardiovascular:  Negative for chest pain.  Gastrointestinal:  Negative for abdominal pain, nausea and vomiting.  Exam: Physical Exam HENT:     Head: Normocephalic.     Mouth/Throat:     Pharynx: No oropharyngeal exudate.  Eyes:     General: Lids are normal.     Conjunctiva/sclera: Conjunctivae normal.  Cardiovascular:     Rate and Rhythm: Normal rate and regular rhythm.     Heart sounds: Normal heart sounds, S1 normal and S2 normal.  Pulmonary:     Breath sounds: No decreased breath sounds, wheezing, rhonchi or rales.  Abdominal:     Palpations: Abdomen is soft.     Tenderness: There is no abdominal tenderness.  Musculoskeletal:     Right lower leg: No swelling.     Left lower leg: No swelling.  Skin:    General: Skin is warm.     Findings: No rash.  Neurological:     Mental Status: He is alert.     Comments: Follow some simple commands and answers questions.  Good strength on the left side.      Scheduled Meds:  Chlorhexidine Gluconate Cloth  6 each Topical Daily   cloNIDine  0.2 mg Transdermal Weekly    feeding supplement (NEPRO CARB STEADY)  237 mL Oral BID BM   folic acid  7,341 mcg Oral Daily   insulin aspart  0-9 Units Subcutaneous Q4H   latanoprost  1 drop Both Eyes QHS   multivitamin with minerals  1 tablet Oral Daily   pantoprazole (PROTONIX) IV  40 mg Intravenous Q12H   Continuous Infusions:  ampicillin-sulbactam (UNASYN) IV 3 g (02/15/21 1204)   azithromycin 500 mg (02/14/21 2203)   levETIRAcetam 500 mg (02/15/21 1021)    Assessment/Plan:  Subdural hematoma.  Repeat CT scan shows slightly smaller area of subdural hematoma.  Neurosurgical team did give approval to start heparin drip without bolus.  Family does not want to start heparin or blood thinner at this time. Bilateral segmental pulmonary emboli.  Patient is not a great candidate for anticoagulation with falls and subdural hematoma.  Benefits and risks of anticoagulation versus no anticoagulation explained.  Risks of anticoagulation including bleeding and worsening of the subdural hematoma.  Benefits would be treating the pulmonary emboli.  Risk of not anticoagulation is worsening pulmonary emboli and respiratory distress and even death. Acute metabolic encephalopathy secondary to subdural hematoma.  Placed on dysphagia diet yesterday afternoon Aspiration pneumonia will complete 5 days of antibiotics. Thyroid mass.  TSH normal range.  Depending on how family wants to proceed will depend on what can be done for this as outpatient. Glaucoma on latanoprost Essential hypertension on clonidine patch Severe protein calorie malnutrition Appreciate palliative care consultation.  Currently patient is a full code. PT evaluation    Code Status:     Code Status Orders  (From admission, onward)           Start     Ordered   02/11/21 2226  Full code  Continuous        02/11/21 2226           Code Status History     Date Active Date Inactive Code Status Order ID Comments User Context   03/16/2020 1405 03/30/2020 2228  Full Code [id number]  Gwynne Edinger, MD ED   09/28/2019 2328 10/07/2019 1920 Full Code [id number]  Orene Desanctis, DO ED   08/10/2019 1930 08/24/2019 2103 Full Code [id number]  Rosalin Hawking, MD ED   05/27/2017 1956 06/07/2017 1427 Full Code [id number]  Bary Leriche, PA-C Inpatient   05/27/2017 1956 05/27/2017 1956 Full Code [id number]  Bary Leriche, PA-C Inpatient   05/10/2017 1735 05/27/2017 1754 Full Code [id number]  Clovis Riley, MD Inpatient   07/01/2015 0525 07/01/2015 1901 Full Code [id number]  Harrie Foreman, MD Inpatient      Advance Directive Documentation    Flowsheet Row Most Recent Value  Type of Advance Directive Healthcare Power of Attorney  Pre-existing out of facility DNR order (yellow form or pink MOST form) --  "MOST" Form in Place? --      Family Communication: Spoke with the patient's daughter this morning and this afternoon on the phone Disposition Plan: Status is: Inpatient  Consultants: Neurosurgery  Time spent: 28 minutes  [place]

## 2021-02-15 NOTE — TOC Initial Note (Signed)
Transition of Care [facility]) - Initial/Assessment Note    Patient Details  Name: Malik [name] MRN: [id number] Date of Birth: Jul 10, 1945  [facility] CM/SW Contact:    Pete Pelt, RN Phone Number: 02/15/2021, 4:27 PM  Clinical Narrative:   Patient lives alone, but son will stay with him after discharge.  Daughter states she has discussed hospice home health after discharge.  She has spoken about [facility] and would like to meet with Victorino Dike from [facility] notified.  TOC contact information given, TOC to follow.            Expected Discharge Plan:  (TBD) Barriers to Discharge: Continued Medical Work up   Patient Goals and CMS Choice        Expected Discharge Plan and Services Expected Discharge Plan:  (TBD)   Discharge Planning Services: CM Consult   Living arrangements for the past 2 months: Single Family Home                                      Prior Living Arrangements/Services Living arrangements for the past 2 months: Single Family Home Lives with:: Self (Son will stay at patient's house on discharge) Patient language and need for interpreter reviewed:: Yes Do you feel safe going back to the place where you live?: Yes      Need for Family Participation in Patient Care: Yes (Comment) Care giver support system in place?: Yes (comment) Current home services: Other (comment) (patient has no current home service) Criminal Activity/Legal Involvement Pertinent to Current Situation/Hospitalization: No - Comment as needed  Activities of Daily Living      Permission Sought/Granted Permission sought to share information with : Case Manager Permission granted to share information with : Yes, Verbal Permission Granted     Permission granted to share info w AGENCY: Home health if needed.        Emotional Assessment         Alcohol / Substance Use: Not Applicable Psych Involvement: No (comment)  Admission diagnosis:   Subdural hematoma [S06.5XAA] Acute respiratory failure with hypoxia (HCC) [J96.01] Subdural bleeding (HCC) [I62.00] Altered mental status, unspecified altered mental status type [R41.82] Multiple subsegmental pulmonary emboli without acute cor pulmonale (HCC) [I26.94] Patient Active Problem List   Diagnosis Date Noted   Acute metabolic encephalopathy    Thyroid mass    Aspiration pneumonia of both lungs due to gastric secretions ([place])    Subdural hematoma 02/11/2021   Pulmonary embolism ([place]) 02/11/2021   Pneumonia due to COVID-19 virus 03/16/2020   Severe sepsis ([place]) 03/16/2020   Respiratory failure, acute ([place]) 03/16/2020   Hemiparesis ([place]) 03/16/2020   Aphasia 03/16/2020   Goals of care, counseling/discussion    Palliative care by specialist    Acute on chronic anemia 09/28/2019   History of CVA (cerebrovascular accident) 09/28/2019   Hyperlipidemia LDL goal <70 08/24/2019   Dysphagia due to recent cerebral infarction 08/24/2019   Severe protein-calorie malnutrition ([place]) 08/24/2019   Iron deficiency anemia 08/24/2019   AKI (acute kidney injury) ([place]) 08/24/2019   CKD (chronic kidney disease), stage IIIa 08/24/2019   Alcohol abuse 08/24/2019   Obesity 08/24/2019   Multinodular thyroid 08/24/2019   Pressure injury of skin 37/85/8850   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, unknown source 08/10/2019   Thrombocytosis    Acute lower UTI    Labile blood pressure    Hydrothorax  Sleep disturbance    Pleural effusion on left    Hypertensive crisis    Hypertension    Leukocytosis    Benign essential HTN    Diabetes mellitus type 2 in nonobese (HCC)    Acute blood loss anemia    Trauma 05/27/2017   TBI (traumatic brain injury)    Essential hypertension    Chronic diastolic congestive heart failure ([place])    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

## 2021-02-15 NOTE — Progress Notes (Addendum)
Progress note:  After reviewing the patient's chart, epic notes, labs, and imaging, assessed the patient at bedside.  He just finished a bed bath with 2 nurse aides at bedside.  He is in no apparent distress.    He waved as I greeted him.  When asked if he was in pain he shook his head no.  Patient did not make any verbalizations during my visit.  I   shared that I had spoken with his daughter Malik [name] and son Malik [name].  He began to cry but quickly regrouped himself.  He denied pain by shaking his head no.  I shared that I would be speaking with his family regarding his care and he shook his head yes.  I spoke with the patient's daughter Malik [name] over the phone.  She says she has spoken with her brother and he is beginning to accept their father's poor prognosis.  She says that he is not prepared to make any changes to the plan of care at this moment.  I offered to meet with or speak with any family members regarding the patient's plan of care.    I shared that the patient had eaten 100% of his dinner last night as well as 100% of his breakfast this morning.  That this this means the patient would not be a candidate for the hospice inpatient facility.  However the option still remains for the family to take the patient home with hospice services.  I also reiterated that we can continue with aggressive medical treatment should the family want that decision.  Malik [name] continues to say she would like for her father to be a DNR and to be able to bring him home with hospice.  However, she is speaking with other family members and giving them time to process this  Therapeutic silence and active listening provided for Malik [name] to share her feelings and emotions regarding her dad's current condition.  Palliative medicine team will continue to monitor the patient throughout his hospitalization.  15 minutes  Malik [name] L. Ilsa Iha, FNP-BC Palliative Medicine Team Team Phone # [phone]  Greater than 50% of this  time was spent counseling and coordinating care related to the above assessment and plan.

## 2021-02-15 NOTE — Evaluation (Signed)
Physical Therapy Evaluation Patient Details Name: Jevon Shells MRN: [id number] DOB: 07-30-1945 Today's Date: 02/15/2021  History of Present Illness  Pt is a 75 y.o. male presenting to hospital 12/4 with AMS and SOB (3 L O2 chronic).  Pt fell out of bed Tues 11/29 and has been mostly bedbound since.  12/4 imaging showing B PE's (no R heart strain).  (-) B LE DVT's.  CT head shows small SDH (subacute) with chronic significant L hemispheric stroke.  Pt admitted with AMS, SDH, leukocytosis, and B PE.  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  Clinical Impression  PT consult received.  Chart reviewed.  Pt noted with B PE's but not on anti-coagulation yet (pt also noted with SDH)--therapist contacted MD Steinauer regarding B PE concerns:  MD Wieting reporting not starting anti-coagulation at this time and cleared pt to participate in therapy (to see what pt could do).  D/t pt's expressive aphasia/minimally verbal (and difficult to understand when pt did verbalize), therapist asked pt who to call for information (for PLOF and home set-up)--pt's son and pt's daughter listed as contacts in chart.  Therapist wrote choices on paper and pt pointed to call his daughter [name]) who provided information and reported no questions for therapist.  Prior to hospital admission, pt was able to propel manual w/c modified independently and required minimal assist to ambulate within home with use of cane in L hand; has 24/7 assist; h/o receiving HHPT and [place].  When therapist asked pt if he wanted to get OOB pt nodded his head yes and appeared eager to get OOB.  Currently pt is mod assist x1 semi-supine to sitting edge of bed; close SBA for sitting balance (about 10 minutes edge of bed); and 2 assist back to bed.  One of pt's other daughter's  arrived during session (who reports she is a CNA) and verbalizing concern regarding pt mobilizing d/t blood clots--therapist explained that blood clot concerns was discussed with MD and MD cleared pt for therapy participation.  Pt's daughter reported she needed to discuss concerns with her brother and sister who were making the decisions (for pt) and talked with brother (via phone call) during session and reported that (per discussion with her brother) it was ok to mobilize pt if MD cleared pt and pt wanting to do it.  Pt's daughter verbalizing concerns regarding pt doing more than sitting edge of bed at this time d/t pt's status (although pt had indicated that he wanted to get to the chair) so pt was assisted back to bed and MD [place] notified regarding above concerns.  Pt occasionally briefly tearful during session.  L UE/L LE appearing fairly strong; significant R UE weakness noted (h/o CVA); and R LE weakness noted.  Pt would benefit from skilled PT to address noted impairments and functional limitations (see below for any additional details).  Upon hospital discharge, pt would benefit from continued therapy pending plan of care.       Recommendations for follow up therapy are one component of a multi-disciplinary discharge planning process, led by the attending physician.  Recommendations may be updated based on patient status, additional functional criteria and insurance authorization.  Follow Up Recommendations Follow physician's recommendations for discharge plan and follow up therapies    Assistance Recommended at Discharge Frequent or constant Supervision/Assistance  Functional Status Assessment  Patient has had a recent decline in their functional status and demonstrates the ability to make significant improvements in function in a reasonable and predictable amount of time.  Equipment Recommendations  Other (comment) (pt has manual w/c and BSC at home already)    Recommendations for Other  Services       Precautions / Restrictions Precautions Precautions: Fall Precaution Comments: Aspiration; SBP <160 (d/t SDH) Restrictions Weight Bearing Restrictions: No      Mobility  Bed Mobility Overal bed mobility: Needs Assistance Bed Mobility: Supine to Sit;Sit to Supine     Supine to sit: Mod assist;HOB elevated     General bed mobility comments: pt able to move B LE's towards edge of bed but required mod assist for trunk to sit up on edge of bed; 2 assist to lay back down in bed and to scoot up in bed using bed sheet    Transfers                   General transfer comment: Deferred    Ambulation/Gait               General Gait Details: Deferred  Stairs            Wheelchair Mobility    Modified Rankin (Stroke Patients Only)       Balance Overall balance assessment: Needs assistance Sitting-balance support: Single extremity supported;Feet supported Sitting balance-Leahy Scale: Fair Sitting balance - Comments: initially with mild R lean in sitting (occasionally leaning onto R elbow but then sitting back up again) but with repositioning pt able to sit on edge of bed with close SBA Postural control: Right lateral lean                                   Pertinent Vitals/Pain Pain Assessment: No/denies pain BP 146/60 at rest beginning of session and 154/64 sitting edge of bed. HR 63-77 bpm and O2 sats 96% or greater on 2 L O2 via nasal cannula during sessions activities.    Home Living Family/patient expects to be discharged to:: Private residence Living Arrangements: Alone Available Help at Discharge: Family;Home health (Receiving HHPT and HHOT) Type of Home: Apartment (1st floor) Home Access: Level entry       Home Layout: One level Home Equipment: Cane - single point;Grab bars - tub/shower;Shower seat;BSC/3in1;Wheelchair - manual Additional Comments: Information provided via phone call with pt's daughter [name]  (secondary to pt minimally verbal with expressive aphasia)    Prior Function Prior Level of Function : Needs assist       Physical Assist : Mobility (physical) Mobility (physical): Transfers;Gait   Mobility Comments: Pt ambulates within home with minimal assist and walking cane in L hand; mostly uses manual w/c (and can propel w/c on own); has 24/7 assist; no other recent fall except for recent fall OOB       Hand Dominance        Extremity/Trunk Assessment   Upper Extremity Assessment Upper Extremity Assessment: RUE deficits/detail;LUE deficits/detail RUE Deficits / Details: 0/5 R shoulder flexion, elbow flexion/extension, and finger flexion (pt's fingers resting in flexed position--decreased finger extension noted); R elbow flexion to grossly 70 degrees and R elbow extension grossly 10 degrees short of neutral LUE Deficits / Details: good L hand grip strength; at least 3/5 AROM elbow flexion/extension; AAROM L shoulder flexion to grossly 80 degrees (limited d/t L shoulder discomfort)  Lower Extremity Assessment Lower Extremity Assessment: RLE deficits/detail;LLE deficits/detail RLE Deficits / Details: at least 3/5 hip flexion, knee flexion/extension, and at least 2+/5 DF/PF LLE Deficits / Details: at least 4/5 hip flexion, knee flexion/extension, and DF/PF    Cervical / Trunk Assessment Cervical / Trunk Assessment: Normal  Communication   Communication: Expressive difficulties (Does well with nodding head yes/no to questions)  Cognition Arousal/Alertness: Awake/alert Behavior During Therapy: Flat affect (occasionally appearing upset/tearful) Overall Cognitive Status: Difficult to assess                                 General Comments: Follows 1 step cues fairly consistently and multi-step cues with increased time and additional cueing        General Comments  Nursing cleared pt for participation in physical therapy.  Pt agreeable to PT session.      Exercises     Assessment/Plan    PT Assessment Patient needs continued PT services  PT Problem List Decreased strength;Decreased activity tolerance;Decreased balance;Decreased mobility;Decreased knowledge of precautions       PT Treatment Interventions DME instruction;Gait training;Functional mobility training;Therapeutic activities;Therapeutic exercise;Balance training;Patient/family education;Wheelchair mobility training    PT Goals (Current goals can be found in the Care Plan section)  Acute Rehab PT Goals Patient Stated Goal: pt nodded his head yes when asked if he wanted to get OOB to the chair PT Goal Formulation: With patient Time For Goal Achievement: 03/01/21 Potential to Achieve Goals: Fair    Frequency Min 2X/week   Barriers to discharge Other (comment) Question level of assist required vs available    Co-evaluation               AM-PAC PT "6 Clicks" Mobility  Outcome Measure Help needed turning from your back to your side while in a flat bed without using bedrails?: A Little Help needed moving from lying on your back to sitting on the side of a flat bed without using bedrails?: A Lot Help needed moving to and from a bed to a chair (including a wheelchair)?: Total Help needed standing up from a chair using your arms (e.g., wheelchair or bedside chair)?: Total Help needed to walk in hospital room?: Total Help needed climbing 3-5 steps with a railing? : Total 6 Click Score: 9    End of Session Equipment Utilized During Treatment: Oxygen (2 L via nasal cannula) Activity Tolerance: Patient tolerated treatment well Patient left: in bed;with call bell/phone within reach;with bed alarm set;with family/visitor present Nurse Communication: Mobility status;Precautions PT Visit Diagnosis: Other abnormalities of gait and mobility (R26.89);Muscle weakness (generalized) (M62.81);History of falling (Z91.81)    Time: 2355-7322 PT Time Calculation (min) (ACUTE ONLY): 35  min   Charges:   PT Evaluation $PT Eval Low Complexity: 1 Low PT Treatments $Therapeutic Exercise: 8-22 mins        Leitha Bleak, PT 02/15/21, 2:51 PM

## 2021-02-15 NOTE — Progress Notes (Signed)
Initial Nutrition Assessment  DOCUMENTATION CODES:   Not applicable  INTERVENTION:   -MVI with minerals daily -Feeding assistance with meals -Nepro Shake po BID, each supplement provides 425 kcal and 19 grams protein  -Magic cup TID with meals, each supplement provides 290 kcal and 9 grams of protein   NUTRITION DIAGNOSIS:   Increased nutrient needs related to acute illness as evidenced by estimated needs.  GOAL:   Patient will meet greater than or equal to 90% of their needs  MONITOR:   PO intake, Supplement acceptance, Diet advancement, Labs, Weight trends, Skin, I & O's  REASON FOR ASSESSMENT:   Low Braden    ASSESSMENT:   Malik [name] is a 75 y.o. male with medical history significant for CVA status post thrombectomy approximately 2 years ago, dementia, non-insulin-dependent diabetes mellitus, hypertension, GERD, who presents emergency department from home for chief concerns of shortness of breath and worsening altered mental status.  Pt admitted with AMS and SDH.   12/7- s/p BSE- dysphagia 1 diet with nectar thick liquids  Reviewed I/O's: -110 ml x 24 hours and -1.8 L since admission  UOP: 900 ml x 24 hours  Spoke with pt at bedside. He was minimally interactive with this RD, but reports feeling better today. Observed breakfast tray at bedside. Pt consumed about 50% of tray. Noted documented meal completions 100%.    Reviewed wt hx; pt has experienced a 9.6% wt loss over the past year.   Palliative care following for goals of care discussions.   Lab Results  Component Value Date   HGBA1C 7.8 (H) 02/12/2021   PTA DM medications are 1000 mg metformin BID.   Labs reviewed: CBGS: 95-220 (inpatient orders for glycemic control are 0-9 units insulin aspart every 4 hours).    NUTRITION - FOCUSED PHYSICAL EXAM:  Flowsheet Row Most Recent Value  Orbital Region No depletion  Upper Arm Region Mild depletion  Thoracic and Lumbar Region No depletion  Buccal  Region No depletion  Temple Region No depletion  Clavicle Bone Region Moderate depletion  Clavicle and Acromion Bone Region Mild depletion  Scapular Bone Region Mild depletion  Dorsal Hand Mild depletion  Patellar Region No depletion  Anterior Thigh Region No depletion  Posterior Calf Region No depletion  Edema (RD Assessment) None  Hair Reviewed  Eyes Reviewed  Mouth Reviewed  Skin Reviewed  Nails Reviewed       Diet Order:   Diet Order             DIET - DYS 1 Room service appropriate? Yes with Assist; Fluid consistency: Nectar Thick  Diet effective now                   EDUCATION NEEDS:   Not appropriate for education at this time  Skin:  Skin Assessment: Reviewed RN Assessment  Last BM:  02/11/21  Height:   Ht Readings from Last 1 Encounters:  02/11/21 5\' 8"  (1.727 m)    Weight:   Wt Readings from Last 1 Encounters:  02/11/21 81.6 kg    Ideal Body Weight:  70 kg  BMI:  Body mass index is 27.37 kg/m.  Estimated Nutritional Needs:   Kcal:  1800-2000  Protein:  100-115 grams  Fluid:  > 1.8 L    Loistine Chance, RD, LDN, [place] Registered Dietitian II Certified Diabetes Care and Education Specialist Please refer to [facility] for RD and/or RD on-call/weekend/after hours pager

## 2021-02-16 ENCOUNTER — Ambulatory Visit: Payer: Medicare Other | Admitting: Family

## 2021-02-16 DIAGNOSIS — D509 Iron deficiency anemia, unspecified: Secondary | ICD-10-CM

## 2021-02-16 LAB — CULTURE, BLOOD (ROUTINE X 2)
Culture: NO GROWTH
Culture: NO GROWTH

## 2021-02-16 LAB — GLUCOSE, CAPILLARY
Glucose-Capillary: 118 mg/dL — ABNORMAL HIGH (ref 70–99)
Glucose-Capillary: 118 mg/dL — ABNORMAL HIGH (ref 70–99)
Glucose-Capillary: 125 mg/dL — ABNORMAL HIGH (ref 70–99)
Glucose-Capillary: 232 mg/dL — ABNORMAL HIGH (ref 70–99)

## 2021-02-16 MED ORDER — LEVETIRACETAM 500 MG PO TABS: 500.0000 mg | ORAL_TABLET | Freq: Two times a day (BID) | ORAL | 0 refills | Status: DC

## 2021-02-16 MED ORDER — CLONIDINE 0.2 MG/24HR TD PTWK: 0.2000 mg | MEDICATED_PATCH | TRANSDERMAL | 0 refills | Status: AC

## 2021-02-16 MED ORDER — LEVETIRACETAM 500 MG PO TABS: 500.0000 mg | ORAL_TABLET | Freq: Two times a day (BID) | ORAL | 0 refills | Status: AC

## 2021-02-16 NOTE — Progress Notes (Addendum)
[facility]) Hospital Liaison Note  Received request from Transitions of Care Manager Dreama Saa, RN, for hospice services at home after discharge. Per Jiles Garter, patient's daughter requested [place] reach out to her today, 02/16/21. Chart and patient information reviewed by [facility] physician. Hospice eligibility confirmed.  Visited patient at bedside and spoke with daughter [name] to initiate education related to hospice philosophy, services and team approach to care. Patient/family verbalized understanding of information provided.   DME needs discussed. Patient has the following equipment in the home: [facility], shower chair, hospital bed. Patient/family requests the following equipment for delivery: oxygen. Address has been verified and is correct in the chart. [name] is the family contact to arrange time of equipment delivery. [name] states that she is unable to meet at patient's home for equipment delivery prior to 4 pm today. Hospital care team notified.   Please provide prescriptions at discharge as needed to ensure ongoing symptom management.   [facility] information and contact numbers given to family. Above information shared with [place].   Please do not hesitate to call with any hospice related questions or concerns.   Thank you for the opportunity to participate in this patient's care.   Bobbie "Loren Racer, RN, BSN [facility] Liaison [phone]

## 2021-02-16 NOTE — TOC Progression Note (Addendum)
Transition of Care [facility]) - Progression Note    Patient Details  Name: Malik [name] MRN: [id number] Date of Birth: 1945/09/20  Transition of Care [facility]) CM/SW [place], LCSW Phone Number: 02/16/2021, 12:21 PM  Clinical Narrative:   Updated by Malik [name] with [facility] that patient will go home with hospice and DME should be delivered this evening. Son not able to meet EMS until after 5pm. EMS paperwork completed. Per Hospice patient is not a DNR. Per hospice request asked daughter Malik [name] to call 1C when DME is delivered to the home. RN to call for nonemergent transport once DME delivery is confirmed by daughter. Confirmed with RN Malik [name].    Expected Discharge Plan:  (TBD) Barriers to Discharge: Continued Medical Work up  Expected Discharge Plan and Services Expected Discharge Plan:  (TBD)   Discharge Planning Services: CM Consult   Living arrangements for the past 2 months: Single Family Home Expected Discharge Date: 02/16/21                                     Social Determinants of Health (SDOH) Interventions    Readmission Risk Interventions No flowsheet data found.

## 2021-02-16 NOTE — Care Management Important Message (Signed)
Important Message  Patient Details  Name: Malik [name] MRN: [id number] Date of Birth: 18-Dec-1945   Medicare Important Message Given:  Other (see comment)  Patient is discharging home with Hospice. Out of respect for the patient and family no Important Message from [facility] given.   [name] 02/16/2021, 1:17 PM

## 2021-02-16 NOTE — Discharge Summary (Signed)
[facility] NAME: Malik [name]    MR#:  [id number]  DATE OF BIRTH:  28-Oct-1945  DATE OF ADMISSION:  02/11/2021 ADMITTING PHYSICIAN: Amy N Cox, DO  DATE OF DISCHARGE Home with Hospice: 02/16/2021  PRIMARY CARE PHYSICIAN: Donnie Coffin, MD    ADMISSION DIAGNOSIS:  Subdural hematoma [S06.5XAA] Acute respiratory failure with hypoxia (HCC) [J96.01] Subdural bleeding (HCC) [I62.00] Altered mental status, unspecified altered mental status type [R41.82] Multiple subsegmental pulmonary emboli without acute cor pulmonale (HCC) [I26.94]  DISCHARGE DIAGNOSIS:  Principal Problem:   Subdural hematoma Active Problems:   Anemia due to chronic kidney disease   TBI (traumatic brain injury)   Essential hypertension   Hypertension   Leukocytosis   Benign essential HTN   Hyperlipidemia LDL goal <70   Severe protein-calorie malnutrition (HCC)   Alcohol abuse   Obesity   Acute on chronic anemia   Pulmonary embolism (HCC)   Acute metabolic encephalopathy   Thyroid mass   Aspiration pneumonia of both lungs due to gastric secretions ([place])   SECONDARY DIAGNOSIS:   Past Medical History:  Diagnosis Date   Angina of effort (HCC)    Chronic diastolic CHF (congestive heart failure) (HCC)    CVA (cerebral vascular accident) ([place]) 08/10/2019   right sided weakness   Diabetes ([place])    Diabetes mellitus without complication ([place])    Dilated cardiomyopathy ([place])    Heart disease    High blood pressure    Hypertension    LVH (left ventricular hypertrophy) due to hypertensive disease    Moderate mitral insufficiency    Neuropathy    with LE weakness   Sleep apnea    TBI (traumatic brain injury)     HOSPITAL COURSE:   Subdural hematoma.  Patient was admitted with altered mental status and found to have a subdural hematoma on CT scan of the head.  The last CT scan of the head did show a slight reduction in size of mixed density subdural  hematoma on the left down to 1 cm and previously 1.2 cm.  Patient was seen by neurosurgical team as consultation.  They were okay with starting heparin for blood thinner but family decided not to do blood thinner at this time.  Empiric Keppra for 7 days upon disposition. Bilateral segmental pulmonary emboli.  With the patient's subdural hematoma the family decided not to do any blood thinner at this time.  Risks of not doing blood thinner include worsening pulmonary emboli, respiratory distress and death.  Risk of doing blood thinner would be bleeding and worsening of subdural hematoma. Acute metabolic encephalopathy secondary to subdural hematoma.  Initially the patient was placed without food and then was placed on dysphagia diet.  The patient able to eat due to dysphagia diet.  Mental status has improved from admission but still impaired. Aspiration pneumonia completed 5 days of antibiotics. Thyroid mass seen on CT scan.  Can follow-up with PMD as outpatient.  TSH in normal range. Glaucoma on latanoprost Essential hypertension on clonidine patch and losartan Severe protein calorie malnutrition Patient will go home with hospice.  Equipment to be delivered today and patient will be discharged later this evening. Type 2 diabetes mellitus.  Patient was off all medications during the hospitalization.  Hemoglobin A1c 7.8.  Continue with diet at this point Iron deficiency anemia.  The patient was transfused a unit of packed red blood cells on a hemoglobin of 6.7.  Hemoglobin did come up  to 8.4.  DISCHARGE CONDITIONS:   Guarded  CONSULTS OBTAINED:  Neurosurgery Palliative care Hospice  DRUG ALLERGIES:  No Known Allergies  DISCHARGE MEDICATIONS:   Allergies as of 02/16/2021   No Known Allergies      Medication List     STOP taking these medications    amLODipine 10 MG tablet Commonly known as: NORVASC   folic acid 540 MCG tablet Commonly known as: FOLVITE   metFORMIN 500 MG  tablet Commonly known as: GLUCOPHAGE   metoprolol tartrate 25 MG tablet Commonly known as: LOPRESSOR       TAKE these medications    acetaminophen 325 MG tablet Commonly known as: TYLENOL Take 650 mg by mouth every 8 (eight) hours as needed for mild pain, fever or moderate pain.   cloNIDine 0.2 mg/24hr patch Commonly known as: CATAPRES - Dosed in mg/24 hr Place 1 patch (0.2 mg total) onto the skin once a week. Start taking on: February 20, 2021   feeding supplement Liqd Take 237 mLs by mouth 3 (three) times daily between meals.   latanoprost 0.005 % ophthalmic solution Commonly known as: XALATAN Place 1 drop into both eyes at bedtime.   levETIRAcetam 500 MG tablet Commonly known as: Keppra Take 1 tablet (500 mg total) by mouth 2 (two) times daily for 7 days.   losartan 100 MG tablet Commonly known as: COZAAR Take 100 mg by mouth daily.   pantoprazole 40 MG tablet Commonly known as: PROTONIX Take 1 tablet (40 mg total) by mouth daily.   Resource ThickenUp Clear Powd Take 120 g by mouth as needed (for nectar thick liquids).         DISCHARGE INSTRUCTIONS:   Follow-up with hospice at home  If you experience worsening of your admission symptoms, develop shortness of breath, life threatening emergency, suicidal or homicidal thoughts you must seek medical attention immediately by calling 911 or calling your MD immediately  if symptoms less severe.  You Must read complete instructions/literature along with all the possible adverse reactions/side effects for all the Medicines you take and that have been prescribed to you. Take any new Medicines after you have completely understood and accept all the possible adverse reactions/side effects.   Please note  You were cared for by a hospitalist during your hospital stay. If you have any questions about your discharge medications or the care you received while you were in the hospital after you are discharged, you can call  the unit and asked to speak with the hospitalist on call if the hospitalist that took care of you is not available. Once you are discharged, your primary care physician will handle any further medical issues. Please note that NO REFILLS for any discharge medications will be authorized once you are discharged, as it is imperative that you return to your primary care physician (or establish a relationship with a primary care physician if you do not have one) for your aftercare needs so that they can reassess your need for medications and monitor your lab values.    Today   CHIEF COMPLAINT:   Chief Complaint  Patient presents with   Altered Mental Status    HISTORY OF PRESENT ILLNESS:  Malik [name]  is a 75 y.o. male came in with altered mental status and found to have a subdural hematoma   VITAL SIGNS:  Blood pressure (!) 143/70, pulse 68, temperature 98.7 F (37.1 C), resp. rate 17, height 5\' 8"  (1.727 m), weight 81.6 kg, SpO2 98 %.  I/O:   Intake/Output Summary (Last 24 hours) at 02/16/2021 1321 Last data filed at 02/16/2021 0900 Gross per 24 hour  Intake 4356.39 ml  Output --  Net 4356.39 ml    PHYSICAL EXAMINATION:  GENERAL:  75 y.o.-year-old patient lying in the bed with no acute distress.  EYES: Pupils equal, round, reactive to light and accommodation. No scleral icterus.   HEENT: Head atraumatic, normocephalic. Oropharynx and nasopharynx clear.   LUNGS: Normal breath sounds bilaterally, no wheezing, rales,rhonchi or crepitation. No use of accessory muscles of respiration.  CARDIOVASCULAR: S1, S2 normal. No murmurs, rubs, or gallops.  ABDOMEN: Soft, non-tender, non-distended.  EXTREMITIES: No pedal edema.  NEUROLOGIC: Cranial nerves II through XII are intact.  Today patient able to straight leg raise bilaterally. PSYCHIATRIC: The patient is alert and answers some questions.  SKIN: No obvious rash, lesion, or ulcer.   DATA REVIEW:   CBC Recent Labs  Lab  02/13/21 0707  WBC 13.8*  HGB 8.4*  HCT 27.6*  PLT 483*    Chemistries  Recent Labs  Lab 02/11/21 1559 02/12/21 0416  NA 141 145  K 3.8 3.6  CL 112* 114*  CO2 24 24  GLUCOSE 132* 137*  BUN 20 15  CREATININE 1.21 0.91  CALCIUM 8.8* 8.7*  AST 23  --   ALT 27  --   ALKPHOS 97  --   BILITOT 0.6  --      Microbiology Results  Results for orders placed or performed during the hospital encounter of 02/11/21  Resp Panel by RT-PCR (Flu A&B, Covid) Nasopharyngeal Swab     Status: None   Collection Time: 02/11/21  6:51 PM   Specimen: Nasopharyngeal Swab; Nasopharyngeal(NP) swabs in vial transport medium  Result Value Ref Range Status   SARS Coronavirus 2 by RT PCR NEGATIVE NEGATIVE Final    Comment: (NOTE) SARS-CoV-2 target nucleic acids are NOT DETECTED.  The SARS-CoV-2 RNA is generally detectable in upper respiratory specimens during the acute phase of infection. The lowest concentration of SARS-CoV-2 viral copies this assay can detect is 138 copies/mL. A negative result does not preclude SARS-Cov-2 infection and should not be used as the sole basis for treatment or other patient management decisions. A negative result may occur with  improper specimen collection/handling, submission of specimen other than nasopharyngeal swab, presence of viral mutation(s) within the areas targeted by this assay, and inadequate number of viral copies(<138 copies/mL). A negative result must be combined with clinical observations, patient history, and epidemiological information. The expected result is Negative.  Fact Sheet for Patients:  EntrepreneurPulse.com.au  Fact Sheet for Healthcare Providers:  IncredibleEmployment.be  This test is no t yet approved or cleared by the [place] FDA and  has been authorized for detection and/or diagnosis of SARS-CoV-2 by FDA under an Emergency Use Authorization (EUA). This EUA will remain  in effect (meaning  this test can be used) for the duration of the COVID-19 declaration under Section 564(b)(1) of the Act, 21 U.S.C.section 360bbb-3(b)(1), unless the authorization is terminated  or revoked sooner.       Influenza A by PCR NEGATIVE NEGATIVE Final   Influenza B by PCR NEGATIVE NEGATIVE Final    Comment: (NOTE) The Xpert Xpress SARS-CoV-2/FLU/RSV plus assay is intended as an aid in the diagnosis of influenza from Nasopharyngeal swab specimens and should not be used as a sole basis for treatment. Nasal washings and aspirates are unacceptable for Xpert Xpress SARS-CoV-2/FLU/RSV testing.  Fact Sheet for Patients: EntrepreneurPulse.com.au  Fact  Sheet for Healthcare Providers: IncredibleEmployment.be  This test is not yet approved or cleared by the [place] and has been authorized for detection and/or diagnosis of SARS-CoV-2 by FDA under an Emergency Use Authorization (EUA). This EUA will remain in effect (meaning this test can be used) for the duration of the COVID-19 declaration under Section 564(b)(1) of the Act, 21 U.S.C. section 360bbb-3(b)(1), unless the authorization is terminated or revoked.  Performed at [facility], [address] [place], [place] [address]   Blood culture (routine x 2)     Status: None   Collection Time: 02/11/21  7:43 PM   Specimen: BLOOD  Result Value Ref Range Status   Specimen Description BLOOD FA  Final   Special Requests   Final    BOTTLES DRAWN AEROBIC AND ANAEROBIC Blood Culture results may not be optimal due to an excessive volume of blood received in culture bottles   Culture   Final    NO GROWTH 5 DAYS Performed at [facility], [address] [place], [place] [address]    Report Status 02/16/2021 FINAL  Final  Blood culture (routine x 2)     Status: None   Collection Time: 02/11/21  7:48 PM   Specimen: BLOOD  Result Value Ref Range Status   Specimen Description BLOOD  RHUND  Final   Special Requests   Final    BOTTLES DRAWN AEROBIC ONLY Blood Culture results may not be optimal due to an inadequate volume of blood received in culture bottles   Culture   Final    NO GROWTH 5 DAYS Performed at [facility], [address], [place], [place] [address]    Report Status 02/16/2021 FINAL  Final  MRSA Next Gen by PCR, Nasal     Status: None   Collection Time: 02/11/21 11:55 PM   Specimen: Nasal Mucosa; Nasal Swab  Result Value Ref Range Status   MRSA by PCR Next Gen NOT DETECTED NOT DETECTED Final    Comment: (NOTE) The GeneXpert MRSA Assay (FDA approved for NASAL specimens only), is one component of a comprehensive MRSA colonization surveillance program. It is not intended to diagnose MRSA infection nor to guide or monitor treatment for MRSA infections. Test performance is not FDA approved in patients less than 39 years old. Performed at [facility], [address] [place], [place] [address]       Management plans discussed with the patient, family (patients daughter) and they are in agreement.  CODE STATUS:     Code Status Orders  (From admission, onward)           Start     Ordered   02/11/21 2226  Full code  Continuous        02/11/21 2226           Code Status History     Date Active Date Inactive Code Status Order ID Comments User Context   03/16/2020 1405 03/30/2020 2228 Full Code [id number]  Gwynne Edinger, MD ED   09/28/2019 2328 10/07/2019 1920 Full Code [id number]  Orene Desanctis, DO ED   08/10/2019 1930 08/24/2019 2103 Full Code [id number]  Rosalin Hawking, MD ED   05/27/2017 1956 06/07/2017 1427 Full Code [id number]  Bary Leriche, PA-C Inpatient   05/27/2017 1956 05/27/2017 1956 Full Code [id number]  Bary Leriche, PA-C Inpatient   05/10/2017 1735 05/27/2017 1754 Full Code [id number]  Clovis Riley, MD Inpatient   07/01/2015 [id number] 07/01/2015 1901  Full Code [id number]  Harrie Foreman, MD Inpatient      Advance  Directive Documentation    Flowsheet Row Most Recent Value  Type of Advance Directive Healthcare Power of Attorney  Pre-existing out of facility DNR order (yellow form or pink MOST form) --  "MOST" Form in Place? --       TOTAL TIME TAKING CARE OF THIS PATIENT: 35 minutes.    Loletha Grayer M.D on 02/16/2021 at 1:21 PM   [facility]  CC: Primary care physician; Donnie Coffin, MD

## 2021-03-22 ENCOUNTER — Other Ambulatory Visit: Payer: Medicare Other | Admitting: Primary Care

## 2021-04-11 DEATH — deceased
